# Patient Record
Sex: Female | Born: 1945
Health system: Southern US, Community
[De-identification: ages and names within clinical notes are randomized; demographics above are authoritative.]

## PROBLEM LIST (undated history)

## (undated) DIAGNOSIS — G4730 Sleep apnea, unspecified: Secondary | ICD-10-CM

## (undated) DIAGNOSIS — T7840XA Allergy, unspecified, initial encounter: Secondary | ICD-10-CM

## (undated) DIAGNOSIS — M20002 Unspecified deformity of left finger(s): Secondary | ICD-10-CM

## (undated) DIAGNOSIS — I10 Essential (primary) hypertension: Secondary | ICD-10-CM

## (undated) DIAGNOSIS — Z9289 Personal history of other medical treatment: Secondary | ICD-10-CM

## (undated) DIAGNOSIS — F419 Anxiety disorder, unspecified: Secondary | ICD-10-CM

## (undated) DIAGNOSIS — F329 Major depressive disorder, single episode, unspecified: Secondary | ICD-10-CM

## (undated) DIAGNOSIS — Z8719 Personal history of other diseases of the digestive system: Secondary | ICD-10-CM

## (undated) DIAGNOSIS — D649 Anemia, unspecified: Secondary | ICD-10-CM

## (undated) DIAGNOSIS — E785 Hyperlipidemia, unspecified: Secondary | ICD-10-CM

## (undated) DIAGNOSIS — M79645 Pain in left finger(s): Secondary | ICD-10-CM

## (undated) DIAGNOSIS — F32A Depression, unspecified: Secondary | ICD-10-CM

## (undated) DIAGNOSIS — K219 Gastro-esophageal reflux disease without esophagitis: Secondary | ICD-10-CM

## (undated) DIAGNOSIS — M1990 Unspecified osteoarthritis, unspecified site: Secondary | ICD-10-CM

## (undated) DIAGNOSIS — G8929 Other chronic pain: Secondary | ICD-10-CM

## (undated) DIAGNOSIS — D259 Leiomyoma of uterus, unspecified: Secondary | ICD-10-CM

## (undated) DIAGNOSIS — Z889 Allergy status to unspecified drugs, medicaments and biological substances status: Secondary | ICD-10-CM

## (undated) DIAGNOSIS — K5900 Constipation, unspecified: Secondary | ICD-10-CM

## (undated) DIAGNOSIS — M169 Osteoarthritis of hip, unspecified: Secondary | ICD-10-CM

## (undated) HISTORY — DX: Leiomyoma of uterus, unspecified: D25.9

## (undated) HISTORY — PX: TONSILLECTOMY: SUR1361

## (undated) HISTORY — DX: Osteoarthritis of hip, unspecified: M16.9

## (undated) HISTORY — DX: Unspecified deformity of left finger(s): M20.002

## (undated) HISTORY — DX: Pain in left finger(s): M79.645

## (undated) HISTORY — DX: Other chronic pain: G89.29

## (undated) HISTORY — PX: WISDOM TOOTH EXTRACTION: SHX21

---

## 1999-09-23 ENCOUNTER — Other Ambulatory Visit: Admission: RE | Admit: 1999-09-23 | Discharge: 1999-09-23 | Payer: Self-pay | Admitting: Obstetrics and Gynecology

## 2000-11-23 ENCOUNTER — Other Ambulatory Visit: Admission: RE | Admit: 2000-11-23 | Discharge: 2000-11-23 | Payer: Self-pay | Admitting: Obstetrics and Gynecology

## 2002-04-07 ENCOUNTER — Other Ambulatory Visit: Admission: RE | Admit: 2002-04-07 | Discharge: 2002-04-07 | Payer: Self-pay | Admitting: Obstetrics and Gynecology

## 2002-04-10 ENCOUNTER — Encounter: Payer: Self-pay | Admitting: Obstetrics and Gynecology

## 2002-04-10 ENCOUNTER — Encounter: Admission: RE | Admit: 2002-04-10 | Discharge: 2002-04-10 | Payer: Self-pay | Admitting: Obstetrics and Gynecology

## 2003-05-01 ENCOUNTER — Other Ambulatory Visit: Admission: RE | Admit: 2003-05-01 | Discharge: 2003-05-01 | Payer: Self-pay | Admitting: Obstetrics and Gynecology

## 2003-05-04 ENCOUNTER — Encounter: Admission: RE | Admit: 2003-05-04 | Discharge: 2003-05-04 | Payer: Self-pay | Admitting: Obstetrics and Gynecology

## 2003-05-04 ENCOUNTER — Encounter: Payer: Self-pay | Admitting: Obstetrics and Gynecology

## 2003-08-08 ENCOUNTER — Encounter: Admission: RE | Admit: 2003-08-08 | Discharge: 2003-09-14 | Payer: Self-pay | Admitting: Psychology

## 2004-05-20 ENCOUNTER — Encounter: Admission: RE | Admit: 2004-05-20 | Discharge: 2004-05-20 | Payer: Self-pay | Admitting: Obstetrics and Gynecology

## 2004-05-23 ENCOUNTER — Encounter: Admission: RE | Admit: 2004-05-23 | Discharge: 2004-05-23 | Payer: Self-pay | Admitting: Obstetrics and Gynecology

## 2005-06-24 ENCOUNTER — Encounter: Admission: RE | Admit: 2005-06-24 | Discharge: 2005-06-24 | Payer: Self-pay | Admitting: Obstetrics and Gynecology

## 2005-11-09 HISTORY — PX: DILATATION & CURRETTAGE/HYSTEROSCOPY WITH RESECTOCOPE: SHX5572

## 2005-11-09 HISTORY — PX: UTERINE FIBROID SURGERY: SHX826

## 2006-05-19 ENCOUNTER — Ambulatory Visit (HOSPITAL_BASED_OUTPATIENT_CLINIC_OR_DEPARTMENT_OTHER): Admission: RE | Admit: 2006-05-19 | Discharge: 2006-05-19 | Payer: Self-pay | Admitting: Obstetrics and Gynecology

## 2006-06-25 ENCOUNTER — Encounter: Admission: RE | Admit: 2006-06-25 | Discharge: 2006-06-25 | Payer: Self-pay | Admitting: Obstetrics and Gynecology

## 2006-11-17 ENCOUNTER — Ambulatory Visit (HOSPITAL_COMMUNITY): Admission: RE | Admit: 2006-11-17 | Discharge: 2006-11-17 | Payer: Self-pay | Admitting: Obstetrics and Gynecology

## 2006-11-17 ENCOUNTER — Encounter (INDEPENDENT_AMBULATORY_CARE_PROVIDER_SITE_OTHER): Payer: Self-pay | Admitting: Specialist

## 2007-07-04 ENCOUNTER — Encounter: Admission: RE | Admit: 2007-07-04 | Discharge: 2007-07-04 | Payer: Self-pay | Admitting: Obstetrics and Gynecology

## 2008-07-06 ENCOUNTER — Encounter: Admission: RE | Admit: 2008-07-06 | Discharge: 2008-07-06 | Payer: Self-pay | Admitting: Obstetrics and Gynecology

## 2009-09-24 ENCOUNTER — Encounter: Admission: RE | Admit: 2009-09-24 | Discharge: 2009-09-24 | Payer: Self-pay | Admitting: Obstetrics and Gynecology

## 2010-11-12 ENCOUNTER — Encounter
Admission: RE | Admit: 2010-11-12 | Discharge: 2010-11-12 | Payer: Self-pay | Source: Home / Self Care | Attending: Obstetrics and Gynecology | Admitting: Obstetrics and Gynecology

## 2010-11-30 ENCOUNTER — Encounter: Payer: Self-pay | Admitting: Obstetrics and Gynecology

## 2011-03-27 NOTE — Op Note (Signed)
Molly Lucas, Molly Lucas                ACCOUNT NO.:  [id number]   MEDICAL RECORD NO.:  [id number]          PATIENT TYPE:  AMB   LOCATION:  [facility]                         FACILITY:  [facility]   PHYSICIAN:  Sherry A. Dickstein, M.D.DATE OF BIRTH:  1946/09/08   DATE OF PROCEDURE:  05/19/2006  DATE OF DISCHARGE:                                 OPERATIVE REPORT   PREOPERATIVE DIAGNOSES:  1.  Postmenopausal bleeding.  2.  Endometrial polyp versus submucosal fibroid.   POSTOPERATIVE DIAGNOSES:  1.  Postmenopausal bleeding.  2.  Endometrial polyp versus submucosal fibroid.  3.  Cervical stenosis.   PROCEDURES:  Dilatation and curettage.   SURGEON:  Sherry A. Rosalio Macadamia, M.D.   ANESTHESIA:  MAC.   INDICATIONS:  This is a 65 year old G5, P3-0-2-3, woman, who has been on  hormone replacement therapy for approximately 14 years.  This has been  decreased in dose.  Approximately 6-8 weeks ago the patient noticed spotting  off and on with heavier bleeding at times.  The patient had an ultrasound  which showed fluid present and tissue consistent with a submucosal fibroid  or possible polyp.  Because of this, the patient is brought into the  operating room for [facility] hysteroscopy with resectoscope.   FINDINGS:  Cervical stenosis.  Uterus anteflexed, normal size, no adnexal  mass.   PROCEDURE:  The patient was brought into the operating room and given  adequate IV sedation.  She was placed in the dorsal lithotomy position.  Her  perineum was washed with Betadine.  She was draped in a sterile fashion.  The surgeon's gown and gloves were changed.  A speculum was placed within  the vagina.  The vagina was washed with Betadine.  A paracervical block was  administered with 1% Nesacaine.  The cervix was grasped with a single-tooth  tenaculum.  The cervix was noted to be stenotic.  Initially the cervix could  not be dilated because of the stenosis; however, with just very gentle  probing, the cervix was able  to be dilated.  Initially using a #17, the  cervix was dilated and it was felt to have normal dilatation without any  difficulty.  However, with just very gentle dilatation, before the cervix  was dilated even past a #23, it was felt that it was likely that the uterus  had been perforated.  The dilators had gone longer into the uterine cavity  than was felt that the uterine size was.  Therefore, before any further  dilatation was performed, it was decided to stop the whole procedure because  it was felt that this would be dangerous to the patient to continue with the  procedure.  Therefore, no curettage was performed and no further dilating  was performed beyond approximately a 21 or 23 Pratt dilator.  There was no  significant bleeding whatsoever, and this was not felt to be either dilating  off laterally or significantly anterior or posteriorly.  It was felt that  this was probably a midline fundal perforation if it were perforated at all.  A decision was made that this procedure would  be performed a future time  under ultrasound guidance, and it was felt that this was likely to have  happened because of the initial cervical stenosis.  The surgeon's gloves  were changed and the bladder was in-and-out catheterized to assure that no  bladder injury had been performed.  Clear urine was present and this was  felt that reassurance was present.  All instruments had been removed from  the vagina previously.  The patient was cleaned and was taken out of the  dorsal lithotomy position.  She was awakened.  She was moved from the  operating table to a stretcher in stable condition.   COMPLICATIONS:  Uterine perforation.   ESTIMATED BLOOD LOSS:  Less than 5 mL.   Stable condition.      Sherry A. Rosalio Macadamia, M.D.  Electronically Signed     SAD/MEDQ  D:  05/19/2006  T:  05/19/2006  Job:  [id number]

## 2011-03-27 NOTE — Op Note (Signed)
Molly Lucas, Molly Lucas                ACCOUNT NO.:  [id number]   MEDICAL RECORD NO.:  [id number]          PATIENT TYPE:  AMB   LOCATION:  [facility]                           FACILITY:  [facility]   PHYSICIAN:  Sherry A. Dickstein, M.D.DATE OF BIRTH:  Aug 09, 1946   DATE OF PROCEDURE:  11/17/2006  DATE OF DISCHARGE:                               OPERATIVE REPORT   PREOPERATIVE DIAGNOSES:  1. Postmenopausal bleeding.  2. Cervical stenosis.   POSTOPERATIVE DIAGNOSES:  1. Postmenopausal bleeding.  2. Cervical stenosis.  3. Submucosal fibroid.   SURGEON:  Sherry A. Rosalio Macadamia, M.D.   ANESTHESIA:  General.   PROCEDURE:  1. Dilatation and curettage.  2. Hysteroscopy with resectoscope.   INDICATIONS:  This is a 65 year old G5, P3-0-2-3 woman who has had  ongoing postmenopausal bleeding.  The patient was brought to the  operating room approximately 6 months ago at [facility]  for [facility] and hysteroscopy with resectoscope.  The dilating was difficult  because of her cervical stenosis and a perforation of the uterus  happened.  The procedure was stopped because of an assumed perforation.  The patient was allowed to heal.  Repeat ultrasound was performed  showing fluid collection in the endometrium and presumed persistent  cervical stenosis.  The patient has known fibroid on ultrasound.  Because of the persistent vaginal bleeding and the previous uterine  perforation, the patient is brought to the operating room for [facility] and  hysteroscopy with resectoscope under ultrasound guidance.   FINDINGS:  A normal-sized anteflexed uterus, no adnexal mass, ultrasound  revealing normal uterus with no significant hematometra.   PROCEDURE:  The patient was brought into the operating room and given  adequate general anesthesia.  She was placed in the dorsal lithotomy  position.  Her perineum and vagina were washed with Betadine.  A Foley  catheter was inserted into the bladder and clamped off in case  any fluid  was needed to be inserted into the bladder for the ultrasound.  The  speculum was placed within the vagina.  A paracervical block was  administered with 1% Nesacaine.  The cervix was grasped with a single-  toothed tenaculum.  Cervix was sounded.  Small dilators were used  initially for dilatation.  Under ultrasound guidance, the dilators were  dilated up to a #33.  The dilatation was very careful to not perforate  and they were just dilated right to the fundus.  The hysteroscope was  then introduced into the endometrial cavity and pictures were obtained.  A submucosal fibroid was identified.  There was no endometrial  irregularity that could be identified as any kind of endometrial cancer  or any significant thickening of the endometrium.  Because it was felt  that the submucosal fibroid was probably the cause of the irregular  bleeding by a physical mechanical component, the submucosal fibroid was  removed with a double-loop resectoscope; this was removed in pieces.  When it was felt that the most of the fibroid was removed, the  resectoscope was replaced into the endometrial cavity, but in the last  time it was  replaced, it was obvious that the uterus had been perforated  just by its placement because bowel could be seen at this point.  No  electricity was used during this placement.  No electricity had been  used during any part of when there was any sign of any perforation.  There was no significant bleeding.  As soon as this was identified, all  instruments were removed.  There were no significant fluids entered into  the peritoneal cavity.  All instruments were removed from the vagina.  This perforation was midline in the fundus.  There was adequate  hemostasis.  The Foley catheter was removed after it had drained the  bladder.  The patient was then awakened.  She was moved from the  operating table to a stretcher in stable condition.  Estimated blood  loss was 5 mL.   Sorbitol differential was a -200 mL.  Complication was  uterine perforation.      Sherry A. Rosalio Macadamia, M.D.  Electronically Signed     SAD/MEDQ  D:  11/17/2006  T:  11/17/2006  Job:  [id number]

## 2011-06-04 ENCOUNTER — Ambulatory Visit: Payer: 59 | Attending: Family Medicine | Admitting: Physical Therapy

## 2011-06-04 DIAGNOSIS — M2560 Stiffness of unspecified joint, not elsewhere classified: Secondary | ICD-10-CM | POA: Insufficient documentation

## 2011-06-04 DIAGNOSIS — M545 Low back pain, unspecified: Secondary | ICD-10-CM | POA: Insufficient documentation

## 2011-06-04 DIAGNOSIS — R262 Difficulty in walking, not elsewhere classified: Secondary | ICD-10-CM | POA: Insufficient documentation

## 2011-06-04 DIAGNOSIS — IMO0001 Reserved for inherently not codable concepts without codable children: Secondary | ICD-10-CM | POA: Insufficient documentation

## 2011-06-08 ENCOUNTER — Ambulatory Visit: Payer: 59 | Admitting: Physical Therapy

## 2011-06-12 ENCOUNTER — Ambulatory Visit: Payer: 59 | Attending: Family Medicine | Admitting: Physical Therapy

## 2011-06-12 DIAGNOSIS — M2560 Stiffness of unspecified joint, not elsewhere classified: Secondary | ICD-10-CM | POA: Insufficient documentation

## 2011-06-12 DIAGNOSIS — IMO0001 Reserved for inherently not codable concepts without codable children: Secondary | ICD-10-CM | POA: Insufficient documentation

## 2011-06-12 DIAGNOSIS — R262 Difficulty in walking, not elsewhere classified: Secondary | ICD-10-CM | POA: Insufficient documentation

## 2011-06-12 DIAGNOSIS — M545 Low back pain, unspecified: Secondary | ICD-10-CM | POA: Insufficient documentation

## 2011-06-15 ENCOUNTER — Ambulatory Visit: Payer: 59 | Admitting: Physical Therapy

## 2011-06-18 ENCOUNTER — Ambulatory Visit: Payer: 59 | Admitting: Physical Therapy

## 2011-06-22 ENCOUNTER — Ambulatory Visit: Payer: 59 | Admitting: Physical Therapy

## 2011-06-25 ENCOUNTER — Ambulatory Visit: Payer: 59 | Admitting: Physical Therapy

## 2011-06-29 ENCOUNTER — Ambulatory Visit: Payer: 59 | Admitting: Physical Therapy

## 2011-07-02 ENCOUNTER — Ambulatory Visit: Payer: 59

## 2011-07-06 ENCOUNTER — Ambulatory Visit: Payer: 59 | Admitting: Physical Therapy

## 2011-07-09 ENCOUNTER — Ambulatory Visit: Payer: 59 | Admitting: Physical Therapy

## 2011-07-14 ENCOUNTER — Ambulatory Visit: Payer: 59 | Attending: Family Medicine | Admitting: Physical Therapy

## 2011-07-14 DIAGNOSIS — R262 Difficulty in walking, not elsewhere classified: Secondary | ICD-10-CM | POA: Insufficient documentation

## 2011-07-14 DIAGNOSIS — M545 Low back pain, unspecified: Secondary | ICD-10-CM | POA: Insufficient documentation

## 2011-07-14 DIAGNOSIS — IMO0001 Reserved for inherently not codable concepts without codable children: Secondary | ICD-10-CM | POA: Insufficient documentation

## 2011-07-14 DIAGNOSIS — M2560 Stiffness of unspecified joint, not elsewhere classified: Secondary | ICD-10-CM | POA: Insufficient documentation

## 2011-07-16 ENCOUNTER — Encounter: Payer: 59 | Admitting: Physical Therapy

## 2011-07-21 ENCOUNTER — Encounter: Payer: 59 | Admitting: Physical Therapy

## 2011-07-23 ENCOUNTER — Encounter: Payer: 59 | Admitting: Physical Therapy

## 2011-07-24 ENCOUNTER — Emergency Department (HOSPITAL_COMMUNITY)
Admission: EM | Admit: 2011-07-24 | Discharge: 2011-07-24 | Disposition: A | Discharge status: Home / Self Care | Payer: 59 | Attending: Emergency Medicine | Admitting: Emergency Medicine

## 2011-07-24 DIAGNOSIS — F3289 Other specified depressive episodes: Secondary | ICD-10-CM | POA: Insufficient documentation

## 2011-07-24 DIAGNOSIS — I10 Essential (primary) hypertension: Secondary | ICD-10-CM | POA: Insufficient documentation

## 2011-07-24 DIAGNOSIS — E7800 Pure hypercholesterolemia, unspecified: Secondary | ICD-10-CM | POA: Insufficient documentation

## 2011-07-24 DIAGNOSIS — F329 Major depressive disorder, single episode, unspecified: Secondary | ICD-10-CM | POA: Insufficient documentation

## 2011-07-24 LAB — COMPREHENSIVE METABOLIC PANEL
Alkaline Phosphatase: 135 U/L — ABNORMAL HIGH (ref 39–117)
BUN: 11 mg/dL (ref 6–23)
Chloride: 99 mEq/L (ref 96–112)
GFR calc Af Amer: >60 mL/min (ref >60)
GFR calc non Af Amer: >60 mL/min (ref >60)
Glucose, Bld: 100 mg/dL — ABNORMAL HIGH (ref 70–99)
Potassium: 3.9 mEq/L (ref 3.5–5.1)
Total Bilirubin: 1.8 mg/dL — ABNORMAL HIGH (ref 0.3–1.2)

## 2011-07-24 LAB — DIFFERENTIAL
Basophils Absolute: 0 10*3/uL (ref 0.0–0.1)
Basophils Relative: 0 % (ref 0–1)
Eosinophils Absolute: 0.2 10*3/uL (ref 0.0–0.7)
Monocytes Absolute: 0.7 10*3/uL (ref 0.1–1.0)
Monocytes Relative: 10 % (ref 3–12)
Neutrophils Relative %: 62 % (ref 43–77)

## 2011-07-24 LAB — ETHANOL: Alcohol, Ethyl (B): <11 mg/dL (ref 0–11)

## 2011-07-24 LAB — CBC
MCH: 33.3 pg (ref 26.0–34.0)
MCHC: 35.2 g/dL (ref 30.0–36.0)
Platelets: 252 10*3/uL (ref 150–400)
RBC: 5.19 MIL/uL — ABNORMAL HIGH (ref 3.87–5.11)

## 2011-07-24 LAB — RAPID URINE DRUG SCREEN, HOSP PERFORMED
Cocaine: NOT DETECTED
Opiates: NOT DETECTED

## 2011-07-30 ENCOUNTER — Ambulatory Visit: Payer: 59 | Admitting: Physical Therapy

## 2012-03-29 ENCOUNTER — Other Ambulatory Visit: Payer: Self-pay | Admitting: Family Medicine

## 2012-03-29 DIAGNOSIS — M545 Low back pain, unspecified: Secondary | ICD-10-CM

## 2012-04-01 ENCOUNTER — Ambulatory Visit
Admission: RE | Admit: 2012-04-01 | Discharge: 2012-04-01 | Disposition: A | Discharge status: Home / Self Care | Payer: 59 | Source: Ambulatory Visit | Attending: Family Medicine | Admitting: Family Medicine

## 2012-04-01 DIAGNOSIS — M545 Low back pain, unspecified: Secondary | ICD-10-CM

## 2012-04-06 ENCOUNTER — Other Ambulatory Visit: Payer: Self-pay | Admitting: Family Medicine

## 2012-04-06 DIAGNOSIS — D492 Neoplasm of unspecified behavior of bone, soft tissue, and skin: Secondary | ICD-10-CM

## 2012-04-08 ENCOUNTER — Ambulatory Visit
Admission: RE | Admit: 2012-04-08 | Discharge: 2012-04-08 | Disposition: A | Discharge status: Home / Self Care | Payer: 59 | Source: Ambulatory Visit | Attending: Family Medicine | Admitting: Family Medicine

## 2012-04-08 DIAGNOSIS — D492 Neoplasm of unspecified behavior of bone, soft tissue, and skin: Secondary | ICD-10-CM

## 2012-04-22 DIAGNOSIS — F331 Major depressive disorder, recurrent, moderate: Secondary | ICD-10-CM | POA: Diagnosis not present

## 2012-04-25 DIAGNOSIS — M25559 Pain in unspecified hip: Secondary | ICD-10-CM | POA: Diagnosis not present

## 2012-04-25 DIAGNOSIS — M47817 Spondylosis without myelopathy or radiculopathy, lumbosacral region: Secondary | ICD-10-CM | POA: Diagnosis not present

## 2012-04-25 DIAGNOSIS — M48061 Spinal stenosis, lumbar region without neurogenic claudication: Secondary | ICD-10-CM | POA: Diagnosis not present

## 2012-04-26 DIAGNOSIS — M5137 Other intervertebral disc degeneration, lumbosacral region: Secondary | ICD-10-CM | POA: Diagnosis not present

## 2012-04-26 DIAGNOSIS — D216 Benign neoplasm of connective and other soft tissue of trunk, unspecified: Secondary | ICD-10-CM | POA: Diagnosis not present

## 2012-04-26 DIAGNOSIS — M4120 Other idiopathic scoliosis, site unspecified: Secondary | ICD-10-CM | POA: Diagnosis not present

## 2012-04-26 DIAGNOSIS — M545 Low back pain, unspecified: Secondary | ICD-10-CM | POA: Diagnosis not present

## 2012-04-26 DIAGNOSIS — M47817 Spondylosis without myelopathy or radiculopathy, lumbosacral region: Secondary | ICD-10-CM | POA: Diagnosis not present

## 2012-04-26 DIAGNOSIS — M546 Pain in thoracic spine: Secondary | ICD-10-CM | POA: Diagnosis not present

## 2012-04-28 DIAGNOSIS — F329 Major depressive disorder, single episode, unspecified: Secondary | ICD-10-CM | POA: Diagnosis not present

## 2012-04-28 DIAGNOSIS — I10 Essential (primary) hypertension: Secondary | ICD-10-CM | POA: Diagnosis not present

## 2012-04-28 DIAGNOSIS — F3289 Other specified depressive episodes: Secondary | ICD-10-CM | POA: Diagnosis not present

## 2012-04-28 DIAGNOSIS — M545 Low back pain, unspecified: Secondary | ICD-10-CM | POA: Diagnosis not present

## 2012-04-28 DIAGNOSIS — I872 Venous insufficiency (chronic) (peripheral): Secondary | ICD-10-CM | POA: Diagnosis not present

## 2012-05-04 DIAGNOSIS — F331 Major depressive disorder, recurrent, moderate: Secondary | ICD-10-CM | POA: Diagnosis not present

## 2012-05-11 DIAGNOSIS — M25559 Pain in unspecified hip: Secondary | ICD-10-CM | POA: Diagnosis not present

## 2012-05-11 DIAGNOSIS — M1610 Unilateral primary osteoarthritis, unspecified hip: Secondary | ICD-10-CM | POA: Diagnosis not present

## 2012-05-11 DIAGNOSIS — M169 Osteoarthritis of hip, unspecified: Secondary | ICD-10-CM | POA: Diagnosis not present

## 2012-05-30 DIAGNOSIS — F331 Major depressive disorder, recurrent, moderate: Secondary | ICD-10-CM | POA: Diagnosis not present

## 2012-05-30 DIAGNOSIS — E785 Hyperlipidemia, unspecified: Secondary | ICD-10-CM | POA: Diagnosis not present

## 2012-05-30 DIAGNOSIS — I10 Essential (primary) hypertension: Secondary | ICD-10-CM | POA: Diagnosis not present

## 2012-05-31 DIAGNOSIS — Z01419 Encounter for gynecological examination (general) (routine) without abnormal findings: Secondary | ICD-10-CM | POA: Diagnosis not present

## 2012-05-31 DIAGNOSIS — Z124 Encounter for screening for malignant neoplasm of cervix: Secondary | ICD-10-CM | POA: Diagnosis not present

## 2012-05-31 DIAGNOSIS — Z1151 Encounter for screening for human papillomavirus (HPV): Secondary | ICD-10-CM | POA: Diagnosis not present

## 2012-06-01 ENCOUNTER — Other Ambulatory Visit: Payer: Self-pay | Admitting: Obstetrics & Gynecology

## 2012-06-01 DIAGNOSIS — Z1231 Encounter for screening mammogram for malignant neoplasm of breast: Secondary | ICD-10-CM

## 2012-06-01 DIAGNOSIS — I10 Essential (primary) hypertension: Secondary | ICD-10-CM | POA: Diagnosis not present

## 2012-06-01 DIAGNOSIS — M545 Low back pain, unspecified: Secondary | ICD-10-CM | POA: Diagnosis not present

## 2012-06-01 DIAGNOSIS — Z0000 Encounter for general adult medical examination without abnormal findings: Secondary | ICD-10-CM | POA: Diagnosis not present

## 2012-06-01 DIAGNOSIS — K5900 Constipation, unspecified: Secondary | ICD-10-CM | POA: Diagnosis not present

## 2012-06-02 ENCOUNTER — Other Ambulatory Visit: Payer: Self-pay | Admitting: Obstetrics

## 2012-06-02 DIAGNOSIS — M25559 Pain in unspecified hip: Secondary | ICD-10-CM | POA: Diagnosis not present

## 2012-06-02 DIAGNOSIS — M1610 Unilateral primary osteoarthritis, unspecified hip: Secondary | ICD-10-CM | POA: Diagnosis not present

## 2012-06-02 DIAGNOSIS — M169 Osteoarthritis of hip, unspecified: Secondary | ICD-10-CM | POA: Diagnosis not present

## 2012-06-02 DIAGNOSIS — Z780 Asymptomatic menopausal state: Secondary | ICD-10-CM

## 2012-06-06 DIAGNOSIS — F331 Major depressive disorder, recurrent, moderate: Secondary | ICD-10-CM | POA: Diagnosis not present

## 2012-06-09 HISTORY — PX: HERNIA REPAIR: SHX51

## 2012-06-13 ENCOUNTER — Encounter (HOSPITAL_COMMUNITY): Payer: Self-pay

## 2012-06-14 ENCOUNTER — Ambulatory Visit
Admission: RE | Admit: 2012-06-14 | Discharge: 2012-06-14 | Disposition: A | Discharge status: Home / Self Care | Payer: Medicare Other | Source: Ambulatory Visit | Attending: Obstetrics & Gynecology | Admitting: Obstetrics & Gynecology

## 2012-06-14 ENCOUNTER — Ambulatory Visit
Admission: RE | Admit: 2012-06-14 | Discharge: 2012-06-14 | Disposition: A | Discharge status: Home / Self Care | Payer: Medicare Other | Source: Ambulatory Visit | Attending: Obstetrics | Admitting: Obstetrics

## 2012-06-14 DIAGNOSIS — Z780 Asymptomatic menopausal state: Secondary | ICD-10-CM | POA: Diagnosis not present

## 2012-06-14 DIAGNOSIS — Z1231 Encounter for screening mammogram for malignant neoplasm of breast: Secondary | ICD-10-CM | POA: Diagnosis not present

## 2012-06-14 DIAGNOSIS — Z13820 Encounter for screening for osteoporosis: Secondary | ICD-10-CM | POA: Diagnosis not present

## 2012-06-15 ENCOUNTER — Other Ambulatory Visit (HOSPITAL_COMMUNITY): Payer: Self-pay | Admitting: Orthopaedic Surgery

## 2012-06-15 DIAGNOSIS — M25559 Pain in unspecified hip: Secondary | ICD-10-CM | POA: Diagnosis not present

## 2012-06-15 DIAGNOSIS — M1610 Unilateral primary osteoarthritis, unspecified hip: Secondary | ICD-10-CM | POA: Diagnosis not present

## 2012-06-15 NOTE — Patient Instructions (Signed)
20 ELIOT BENCIVENGA  06/15/2012   Your procedure is scheduled on:  06/24/12 1000am-1208pm  Report to [facility] at 0730 AM.  Call this number if you have problems the morning of surgery: [phone]   Remember:   Do not eat food:After Midnight.  May have clear liquids:until Midnight .  Marland Kitchen  Take these medicines the morning of surgery with A SIP OF WATER:    Do not wear jewelry, make-up or nail polish.  Do not wear lotions, powders, or perfumes..  Do not shave 48 hours prior to surgery.  Do not bring valuables to the hospital.  Contacts, dentures or bridgework may not be worn into surgery.  Leave suitcase in the car. After surgery it may be brought to your room.  For patients admitted to the hospital, checkout time is 11:00 AM the day of discharge.      Special Instructions: CHG Shower Use Special Wash: 1/2 bottle night before surgery and 1/2 bottle morning of surgery. shower chin to toes with CHG.  Wash face and private parts with regular soap.    Please read over the following fact sheets that you were given: MRSA Information, Blood Transfusion Fact Sheet, Incentive Spirometry Fact sheet , coughing and deep breathing exercises, leg exercises

## 2012-06-16 ENCOUNTER — Encounter (HOSPITAL_COMMUNITY): Payer: Self-pay

## 2012-06-16 ENCOUNTER — Ambulatory Visit (HOSPITAL_COMMUNITY)
Admission: RE | Admit: 2012-06-16 | Discharge: 2012-06-16 | Disposition: A | Discharge status: Home / Self Care | Payer: Medicare Other | Source: Ambulatory Visit | Attending: Orthopaedic Surgery | Admitting: Orthopaedic Surgery

## 2012-06-16 ENCOUNTER — Encounter (HOSPITAL_COMMUNITY)
Admission: RE | Admit: 2012-06-16 | Discharge: 2012-06-16 | Disposition: A | Discharge status: Home / Self Care | Payer: Medicare Other | Source: Ambulatory Visit | Attending: Orthopaedic Surgery | Admitting: Orthopaedic Surgery

## 2012-06-16 DIAGNOSIS — R9431 Abnormal electrocardiogram [ECG] [EKG]: Secondary | ICD-10-CM | POA: Diagnosis not present

## 2012-06-16 DIAGNOSIS — Z01811 Encounter for preprocedural respiratory examination: Secondary | ICD-10-CM | POA: Diagnosis not present

## 2012-06-16 DIAGNOSIS — Z01810 Encounter for preprocedural cardiovascular examination: Secondary | ICD-10-CM | POA: Diagnosis not present

## 2012-06-16 DIAGNOSIS — Z01812 Encounter for preprocedural laboratory examination: Secondary | ICD-10-CM | POA: Diagnosis not present

## 2012-06-16 DIAGNOSIS — Z01818 Encounter for other preprocedural examination: Secondary | ICD-10-CM | POA: Insufficient documentation

## 2012-06-16 DIAGNOSIS — I10 Essential (primary) hypertension: Secondary | ICD-10-CM | POA: Insufficient documentation

## 2012-06-16 HISTORY — DX: Sleep apnea, unspecified: G47.30

## 2012-06-16 HISTORY — DX: Anxiety disorder, unspecified: F41.9

## 2012-06-16 HISTORY — DX: Major depressive disorder, single episode, unspecified: F32.9

## 2012-06-16 HISTORY — DX: Unspecified osteoarthritis, unspecified site: M19.90

## 2012-06-16 HISTORY — DX: Essential (primary) hypertension: I10

## 2012-06-16 HISTORY — DX: Depression, unspecified: F32.A

## 2012-06-16 LAB — URINALYSIS, ROUTINE W REFLEX MICROSCOPIC
Bilirubin Urine: NEGATIVE
Glucose, UA: NEGATIVE mg/dL
Hgb urine dipstick: NEGATIVE
Nitrite: NEGATIVE
Specific Gravity, Urine: 1.025 (ref 1.005–1.030)
pH: 5.5 (ref 5.0–8.0)

## 2012-06-16 LAB — PROTIME-INR
INR: 1.02 (ref 0.00–1.49)
Prothrombin Time: 13.6 seconds (ref 11.6–15.2)

## 2012-06-16 LAB — URINE MICROSCOPIC-ADD ON

## 2012-06-16 LAB — APTT: aPTT: 29 seconds (ref 24–37)

## 2012-06-16 LAB — SURGICAL PCR SCREEN: Staphylococcus aureus: NEGATIVE

## 2012-06-16 NOTE — Progress Notes (Signed)
Patient uses mouthpiece made by dentist instead of CPAP machine.  Patient to bring with her on day of surgery.

## 2012-06-16 NOTE — Progress Notes (Signed)
Abnormal results of urinalysis and micro results faxed and confirmation received to Dr Doneen Poisson.

## 2012-06-17 NOTE — Progress Notes (Signed)
Dr Acey Lav made aware of EKG.  No old ekg to compare from PCP office.  Only history is hypertension. No further orders given.  Patient voiced no cardiac symptoms at preop visit.

## 2012-06-20 NOTE — Progress Notes (Signed)
Vital signs that are documented on 06/17/12 are in error.  The correct vital signs are the ones that are listed on 06/16/12 at 1039am.

## 2012-06-22 DIAGNOSIS — F331 Major depressive disorder, recurrent, moderate: Secondary | ICD-10-CM | POA: Diagnosis not present

## 2012-06-23 NOTE — Progress Notes (Signed)
Patient called to ask if she should bring her supplements with her when she comes to hospital on 06/24/12 for surgery.  I instructed patient not to bring her supplements with her to the hospital .  Patient voiced understanding.

## 2012-06-24 ENCOUNTER — Encounter (HOSPITAL_COMMUNITY): Payer: Self-pay | Admitting: *Deleted

## 2012-06-24 ENCOUNTER — Encounter (HOSPITAL_COMMUNITY): Payer: Self-pay | Admitting: Anesthesiology

## 2012-06-24 ENCOUNTER — Encounter (HOSPITAL_COMMUNITY)
Admission: RE | Disposition: A | Discharge status: Skilled Nursing Facility | Payer: Self-pay | Source: Ambulatory Visit | Attending: Orthopaedic Surgery

## 2012-06-24 ENCOUNTER — Inpatient Hospital Stay (HOSPITAL_COMMUNITY): Payer: Medicare Other

## 2012-06-24 ENCOUNTER — Inpatient Hospital Stay (HOSPITAL_COMMUNITY)
Admission: RE | Admit: 2012-06-24 | Discharge: 2012-06-28 | DRG: 470 | Disposition: A | Discharge status: Skilled Nursing Facility | Payer: Medicare Other | Source: Ambulatory Visit | Attending: Orthopaedic Surgery | Admitting: Orthopaedic Surgery

## 2012-06-24 ENCOUNTER — Inpatient Hospital Stay (HOSPITAL_COMMUNITY): Payer: Medicare Other | Admitting: Anesthesiology

## 2012-06-24 DIAGNOSIS — I10 Essential (primary) hypertension: Secondary | ICD-10-CM | POA: Diagnosis not present

## 2012-06-24 DIAGNOSIS — Z79899 Other long term (current) drug therapy: Secondary | ICD-10-CM | POA: Diagnosis not present

## 2012-06-24 DIAGNOSIS — F411 Generalized anxiety disorder: Secondary | ICD-10-CM | POA: Diagnosis present

## 2012-06-24 DIAGNOSIS — G4730 Sleep apnea, unspecified: Secondary | ICD-10-CM | POA: Diagnosis present

## 2012-06-24 DIAGNOSIS — F329 Major depressive disorder, single episode, unspecified: Secondary | ICD-10-CM | POA: Diagnosis not present

## 2012-06-24 DIAGNOSIS — F3289 Other specified depressive episodes: Secondary | ICD-10-CM | POA: Diagnosis present

## 2012-06-24 DIAGNOSIS — D62 Acute posthemorrhagic anemia: Secondary | ICD-10-CM | POA: Diagnosis not present

## 2012-06-24 DIAGNOSIS — Z880 Allergy status to penicillin: Secondary | ICD-10-CM

## 2012-06-24 DIAGNOSIS — S79919A Unspecified injury of unspecified hip, initial encounter: Secondary | ICD-10-CM | POA: Diagnosis not present

## 2012-06-24 DIAGNOSIS — D649 Anemia, unspecified: Secondary | ICD-10-CM | POA: Diagnosis not present

## 2012-06-24 DIAGNOSIS — E785 Hyperlipidemia, unspecified: Secondary | ICD-10-CM | POA: Diagnosis not present

## 2012-06-24 DIAGNOSIS — M25559 Pain in unspecified hip: Secondary | ICD-10-CM | POA: Diagnosis not present

## 2012-06-24 DIAGNOSIS — M169 Osteoarthritis of hip, unspecified: Secondary | ICD-10-CM

## 2012-06-24 DIAGNOSIS — M1610 Unilateral primary osteoarthritis, unspecified hip: Principal | ICD-10-CM | POA: Diagnosis present

## 2012-06-24 DIAGNOSIS — M1990 Unspecified osteoarthritis, unspecified site: Secondary | ICD-10-CM | POA: Diagnosis not present

## 2012-06-24 DIAGNOSIS — Z471 Aftercare following joint replacement surgery: Secondary | ICD-10-CM | POA: Diagnosis not present

## 2012-06-24 DIAGNOSIS — Z96659 Presence of unspecified artificial knee joint: Secondary | ICD-10-CM | POA: Diagnosis not present

## 2012-06-24 DIAGNOSIS — Z96649 Presence of unspecified artificial hip joint: Secondary | ICD-10-CM | POA: Diagnosis not present

## 2012-06-24 DIAGNOSIS — Z5189 Encounter for other specified aftercare: Secondary | ICD-10-CM | POA: Diagnosis not present

## 2012-06-24 HISTORY — PX: TOTAL HIP ARTHROPLASTY: SHX124

## 2012-06-24 LAB — PREPARE RBC (CROSSMATCH)

## 2012-06-24 SURGERY — ARTHROPLASTY, HIP, TOTAL, ANTERIOR APPROACH
Anesthesia: General | Site: Hip | Laterality: Left | Wound class: Clean

## 2012-06-24 MED ORDER — FUROSEMIDE 10 MG/ML IJ SOLN
20.0000 mg | Freq: Once | INTRAMUSCULAR | Status: AC
  Administered 2012-06-24: 20 mg via INTRAVENOUS
  Filled 2012-06-24: qty 2

## 2012-06-24 MED ORDER — ACETAMINOPHEN 10 MG/ML IV SOLN
INTRAVENOUS | Status: DC | PRN
  Administered 2012-06-24: 1000 mg via INTRAVENOUS

## 2012-06-24 MED ORDER — ROCURONIUM BROMIDE 100 MG/10ML IV SOLN
INTRAVENOUS | Status: DC | PRN
  Administered 2012-06-24: 30 mg via INTRAVENOUS
  Administered 2012-06-24: 10 mg via INTRAVENOUS

## 2012-06-24 MED ORDER — ALUM & MAG HYDROXIDE-SIMETH 200-200-20 MG/5ML PO SUSP: 30.0000 mL | ORAL | Status: DC | PRN

## 2012-06-24 MED ORDER — PROMETHAZINE HCL 25 MG/ML IJ SOLN: 6.2500 mg | INTRAMUSCULAR | Status: DC | PRN

## 2012-06-24 MED ORDER — MORPHINE SULFATE 2 MG/ML IJ SOLN
2.0000 mg | INTRAMUSCULAR | Status: DC | PRN
  Administered 2012-06-24: 2 mg via INTRAVENOUS
  Filled 2012-06-24: qty 1

## 2012-06-24 MED ORDER — VITAMIN D3 125 MCG (5000 UT) PO TABS: 10000.0000 mg | ORAL_TABLET | Freq: Every morning | ORAL | Status: DC

## 2012-06-24 MED ORDER — CLORAZEPATE DIPOTASSIUM 3.75 MG PO TABS
11.2500 mg | ORAL_TABLET | Freq: Every day | ORAL | Status: DC
  Administered 2012-06-24 – 2012-06-27 (×4): 11.25 mg via ORAL
  Filled 2012-06-24: qty 2
  Filled 2012-06-24: qty 3
  Filled 2012-06-24: qty 1
  Filled 2012-06-24 (×2): qty 3

## 2012-06-24 MED ORDER — FERROUS SULFATE 325 (65 FE) MG PO TABS
325.0000 mg | ORAL_TABLET | Freq: Three times a day (TID) | ORAL | Status: DC
  Administered 2012-06-24 – 2012-06-28 (×11): 325 mg via ORAL
  Filled 2012-06-24 (×14): qty 1

## 2012-06-24 MED ORDER — ASPIRIN EC 325 MG PO TBEC
325.0000 mg | DELAYED_RELEASE_TABLET | Freq: Two times a day (BID) | ORAL | Status: DC
  Administered 2012-06-24 – 2012-06-28 (×8): 325 mg via ORAL
  Filled 2012-06-24 (×10): qty 1

## 2012-06-24 MED ORDER — OXYCODONE HCL 5 MG PO TABS
5.0000 mg | ORAL_TABLET | ORAL | Status: DC | PRN
  Administered 2012-06-24 – 2012-06-26 (×5): 10 mg via ORAL
  Filled 2012-06-24 (×3): qty 2
  Filled 2012-06-24: qty 1
  Filled 2012-06-24 (×3): qty 2

## 2012-06-24 MED ORDER — METOCLOPRAMIDE HCL 5 MG/ML IJ SOLN: 5.0000 mg | Freq: Three times a day (TID) | INTRAMUSCULAR | Status: DC | PRN

## 2012-06-24 MED ORDER — FENTANYL CITRATE 0.05 MG/ML IJ SOLN
INTRAMUSCULAR | Status: DC | PRN
  Administered 2012-06-24: 50 ug via INTRAVENOUS
  Administered 2012-06-24 (×2): 100 ug via INTRAVENOUS

## 2012-06-24 MED ORDER — DOCUSATE SODIUM 100 MG PO CAPS
100.0000 mg | ORAL_CAPSULE | Freq: Two times a day (BID) | ORAL | Status: DC
  Administered 2012-06-24 – 2012-06-28 (×8): 100 mg via ORAL

## 2012-06-24 MED ORDER — DULOXETINE HCL 60 MG PO CPEP
60.0000 mg | ORAL_CAPSULE | Freq: Every day | ORAL | Status: DC
  Administered 2012-06-24 – 2012-06-27 (×4): 60 mg via ORAL
  Filled 2012-06-24 (×5): qty 1

## 2012-06-24 MED ORDER — ACETAMINOPHEN 650 MG RE SUPP: 650.0000 mg | Freq: Four times a day (QID) | RECTAL | Status: DC | PRN

## 2012-06-24 MED ORDER — ONDANSETRON HCL 4 MG PO TABS: 4.0000 mg | ORAL_TABLET | Freq: Four times a day (QID) | ORAL | Status: DC | PRN

## 2012-06-24 MED ORDER — SODIUM CHLORIDE 0.9 % IV SOLN
INTRAVENOUS | Status: DC
  Administered 2012-06-24 – 2012-06-25 (×3): via INTRAVENOUS
  Administered 2012-06-26: 1000 mL via INTRAVENOUS

## 2012-06-24 MED ORDER — ONDANSETRON HCL 4 MG/2ML IJ SOLN
INTRAMUSCULAR | Status: DC | PRN
  Administered 2012-06-24: 4 mg via INTRAVENOUS

## 2012-06-24 MED ORDER — EPHEDRINE SULFATE 50 MG/ML IJ SOLN
INTRAMUSCULAR | Status: DC | PRN
  Administered 2012-06-24: 10 mg via INTRAVENOUS
  Administered 2012-06-24: 5 mg via INTRAVENOUS
  Administered 2012-06-24: 10 mg via INTRAVENOUS

## 2012-06-24 MED ORDER — GABAPENTIN 300 MG PO CAPS
300.0000 mg | ORAL_CAPSULE | Freq: Two times a day (BID) | ORAL | Status: DC
  Administered 2012-06-24 – 2012-06-28 (×8): 300 mg via ORAL
  Filled 2012-06-24 (×10): qty 1

## 2012-06-24 MED ORDER — SIMVASTATIN 10 MG PO TABS
10.0000 mg | ORAL_TABLET | Freq: Every day | ORAL | Status: DC
  Administered 2012-06-24 – 2012-06-27 (×4): 10 mg via ORAL
  Filled 2012-06-24 (×5): qty 1

## 2012-06-24 MED ORDER — LACTATED RINGERS IV SOLN
INTRAVENOUS | Status: DC
  Administered 2012-06-24 (×3): via INTRAVENOUS
  Administered 2012-06-24: 1000 mL via INTRAVENOUS

## 2012-06-24 MED ORDER — VITAMINS A & D EX OINT
TOPICAL_OINTMENT | CUTANEOUS | Status: AC
  Administered 2012-06-25
  Filled 2012-06-24: qty 5

## 2012-06-24 MED ORDER — METHOCARBAMOL 100 MG/ML IJ SOLN
500.0000 mg | Freq: Four times a day (QID) | INTRAVENOUS | Status: DC | PRN
  Filled 2012-06-24: qty 5

## 2012-06-24 MED ORDER — GLYCOPYRROLATE 0.2 MG/ML IJ SOLN
INTRAMUSCULAR | Status: DC | PRN
  Administered 2012-06-24: 0.4 mg via INTRAVENOUS

## 2012-06-24 MED ORDER — MENTHOL 3 MG MT LOZG: 1.0000 | LOZENGE | OROMUCOSAL | Status: DC | PRN

## 2012-06-24 MED ORDER — CLINDAMYCIN PHOSPHATE 600 MG/50ML IV SOLN
600.0000 mg | Freq: Four times a day (QID) | INTRAVENOUS | Status: AC
  Administered 2012-06-24 (×2): 600 mg via INTRAVENOUS
  Filled 2012-06-24 (×2): qty 50

## 2012-06-24 MED ORDER — DIPHENHYDRAMINE HCL 12.5 MG/5ML PO ELIX: 12.5000 mg | ORAL_SOLUTION | ORAL | Status: DC | PRN

## 2012-06-24 MED ORDER — KETOROLAC TROMETHAMINE 15 MG/ML IJ SOLN
7.5000 mg | Freq: Four times a day (QID) | INTRAMUSCULAR | Status: AC
  Administered 2012-06-24 – 2012-06-25 (×4): 7.5 mg via INTRAVENOUS
  Filled 2012-06-24 (×4): qty 1

## 2012-06-24 MED ORDER — METHOCARBAMOL 500 MG PO TABS
500.0000 mg | ORAL_TABLET | Freq: Four times a day (QID) | ORAL | Status: DC | PRN
  Administered 2012-06-25 (×2): 500 mg via ORAL
  Filled 2012-06-24 (×4): qty 1

## 2012-06-24 MED ORDER — ZOLPIDEM TARTRATE 5 MG PO TABS: 5.0000 mg | ORAL_TABLET | Freq: Every evening | ORAL | Status: DC | PRN

## 2012-06-24 MED ORDER — LOSARTAN POTASSIUM 50 MG PO TABS
100.0000 mg | ORAL_TABLET | Freq: Every morning | ORAL | Status: DC
  Administered 2012-06-28: 100 mg via ORAL
  Filled 2012-06-24 (×5): qty 2

## 2012-06-24 MED ORDER — DEXAMETHASONE SODIUM PHOSPHATE 10 MG/ML IJ SOLN
INTRAMUSCULAR | Status: DC | PRN
  Administered 2012-06-24: 10 mg via INTRAVENOUS

## 2012-06-24 MED ORDER — PROPOFOL 10 MG/ML IV BOLUS
INTRAVENOUS | Status: DC | PRN
  Administered 2012-06-24: 150 mg via INTRAVENOUS

## 2012-06-24 MED ORDER — SUCCINYLCHOLINE CHLORIDE 20 MG/ML IJ SOLN
INTRAMUSCULAR | Status: DC | PRN
  Administered 2012-06-24: 100 mg via INTRAVENOUS

## 2012-06-24 MED ORDER — CLINDAMYCIN PHOSPHATE 900 MG/50ML IV SOLN
900.0000 mg | INTRAVENOUS | Status: AC
  Administered 2012-06-24: 900 mg via INTRAVENOUS
  Filled 2012-06-24: qty 50

## 2012-06-24 MED ORDER — PHENOL 1.4 % MT LIQD: 1.0000 | OROMUCOSAL | Status: DC | PRN

## 2012-06-24 MED ORDER — MIDAZOLAM HCL 5 MG/5ML IJ SOLN
INTRAMUSCULAR | Status: DC | PRN
  Administered 2012-06-24: 2 mg via INTRAVENOUS

## 2012-06-24 MED ORDER — NEOSTIGMINE METHYLSULFATE 1 MG/ML IJ SOLN
INTRAMUSCULAR | Status: DC | PRN
  Administered 2012-06-24: 3 mg via INTRAVENOUS

## 2012-06-24 MED ORDER — VITAMIN D3 25 MCG (1000 UNIT) PO TABS
10000.0000 [IU] | ORAL_TABLET | Freq: Every day | ORAL | Status: DC
  Administered 2012-06-25 – 2012-06-28 (×4): 10000 [IU] via ORAL
  Filled 2012-06-24 (×4): qty 10

## 2012-06-24 MED ORDER — HETASTARCH-ELECTROLYTES 6 % IV SOLN
INTRAVENOUS | Status: DC | PRN
  Administered 2012-06-24: 10:00:00 via INTRAVENOUS

## 2012-06-24 MED ORDER — METOCLOPRAMIDE HCL 10 MG PO TABS: 5.0000 mg | ORAL_TABLET | Freq: Three times a day (TID) | ORAL | Status: DC | PRN

## 2012-06-24 MED ORDER — LIDOCAINE HCL (CARDIAC) 20 MG/ML IV SOLN
INTRAVENOUS | Status: DC | PRN
  Administered 2012-06-24: 50 mg via INTRAVENOUS

## 2012-06-24 MED ORDER — ACETAMINOPHEN 325 MG PO TABS
650.0000 mg | ORAL_TABLET | Freq: Four times a day (QID) | ORAL | Status: DC | PRN
  Administered 2012-06-26 – 2012-06-28 (×6): 650 mg via ORAL
  Filled 2012-06-24 (×4): qty 2
  Filled 2012-06-24: qty 4
  Filled 2012-06-24: qty 2

## 2012-06-24 MED ORDER — 0.9 % SODIUM CHLORIDE (POUR BTL) OPTIME
TOPICAL | Status: DC | PRN
  Administered 2012-06-24: 1000 mL

## 2012-06-24 MED ORDER — HYDROMORPHONE HCL PF 1 MG/ML IJ SOLN
INTRAMUSCULAR | Status: DC | PRN
  Administered 2012-06-24 (×2): 1 mg via INTRAVENOUS

## 2012-06-24 MED ORDER — HYDROMORPHONE HCL PF 1 MG/ML IJ SOLN
0.2500 mg | INTRAMUSCULAR | Status: DC | PRN
  Administered 2012-06-24: 0.5 mg via INTRAVENOUS

## 2012-06-24 MED ORDER — ONDANSETRON HCL 4 MG/2ML IJ SOLN
4.0000 mg | Freq: Four times a day (QID) | INTRAMUSCULAR | Status: DC | PRN
  Administered 2012-06-25: 4 mg via INTRAVENOUS
  Filled 2012-06-24: qty 2

## 2012-06-24 SURGICAL SUPPLY — 35 items
ACETAB CUP W/GRIPTION 54 (Plate) IMPLANT
BAG ZIPLOCK 12X15 (MISCELLANEOUS) ×4 IMPLANT
BLADE SAW SGTL 18X1.27X75 (BLADE) ×2 IMPLANT
CLOTH BEACON ORANGE TIMEOUT ST (SAFETY) ×2 IMPLANT
CUP ACETAB W/GRIPTION 54 (Plate) IMPLANT
DRAPE C-ARM 42X72 X-RAY (DRAPES) ×2 IMPLANT
DRAPE STERI IOBAN 125X83 (DRAPES) ×2 IMPLANT
DRAPE U-SHAPE 47X51 STRL (DRAPES) ×6 IMPLANT
DRSG MEPILEX BORDER 4X8 (GAUZE/BANDAGES/DRESSINGS) ×2 IMPLANT
DURAPREP 26ML APPLICATOR (WOUND CARE) ×2 IMPLANT
ELECT BLADE TIP CTD 4 INCH (ELECTRODE) ×2 IMPLANT
ELECT REM PT RETURN 9FT ADLT (ELECTROSURGICAL) ×2
ELECTRODE REM PT RTRN 9FT ADLT (ELECTROSURGICAL) ×1 IMPLANT
FACESHIELD LNG OPTICON STERILE (SAFETY) ×8 IMPLANT
GAUZE XEROFORM 1X8 LF (GAUZE/BANDAGES/DRESSINGS) ×2 IMPLANT
GLOVE BIO SURGEON STRL SZ7 (GLOVE) ×2 IMPLANT
GLOVE BIO SURGEON STRL SZ7.5 (GLOVE) ×2 IMPLANT
GLOVE BIOGEL PI IND STRL 7.5 (GLOVE) IMPLANT
GLOVE BIOGEL PI IND STRL 8 (GLOVE) ×1 IMPLANT
GLOVE BIOGEL PI INDICATOR 7.5 (GLOVE)
GLOVE BIOGEL PI INDICATOR 8 (GLOVE) ×1
GLOVE ECLIPSE 7.0 STRL STRAW (GLOVE) ×2 IMPLANT
GOWN STRL REIN XL XLG (GOWN DISPOSABLE) ×4 IMPLANT
KIT BASIN OR (CUSTOM PROCEDURE TRAY) ×2 IMPLANT
PACK TOTAL JOINT (CUSTOM PROCEDURE TRAY) ×2 IMPLANT
PADDING CAST COTTON 6X4 STRL (CAST SUPPLIES) ×2 IMPLANT
STAPLER VISISTAT 35W (STAPLE) IMPLANT
SUT ETHIBOND NAB CT1 #1 30IN (SUTURE) ×4 IMPLANT
SUT VIC AB 1 CT1 36 (SUTURE) ×4 IMPLANT
SUT VIC AB 2-0 CT1 27 (SUTURE) ×2
SUT VIC AB 2-0 CT1 TAPERPNT 27 (SUTURE) ×2 IMPLANT
TOWEL OR 17X26 10 PK STRL BLUE (TOWEL DISPOSABLE) ×4 IMPLANT
TOWEL OR NON WOVEN STRL DISP B (DISPOSABLE) ×2 IMPLANT
TRAY FOLEY CATH 14FRSI W/METER (CATHETERS) ×2 IMPLANT
WATER STERILE IRR 1500ML POUR (IV SOLUTION) ×4 IMPLANT

## 2012-06-24 NOTE — Plan of Care (Signed)
Problem: Consults Goal: Diagnosis- Total Joint Replacement Left anterior hip     

## 2012-06-24 NOTE — Transfer of Care (Signed)
Immediate Anesthesia Transfer of Care Note  Patient: Molly Lucas  Procedure(s) Performed: Procedure(s) (LRB): TOTAL HIP ARTHROPLASTY ANTERIOR APPROACH (Left)  Patient Location: PACU  Anesthesia Type: General  Level of Consciousness: sedated  Airway & Oxygen Therapy: Patient Spontanous Breathing and Patient connected to face mask oxygen  Post-op Assessment: Report given to PACU RN and Post -op Vital signs reviewed and stable  Post vital signs: Reviewed and stable  Complications: No apparent anesthesia complications

## 2012-06-24 NOTE — Anesthesia Preprocedure Evaluation (Signed)
Anesthesia Evaluation  Patient identified by MRN, date of birth, ID band Patient awake    Reviewed: Allergy & Precautions, H&P , NPO status , Patient's Chart, lab work & pertinent test results  Airway Mallampati: II TM Distance: <3 FB Neck ROM: Full    Dental No notable dental hx.    Pulmonary sleep apnea ,  breath sounds clear to auscultation  Pulmonary exam normal       Cardiovascular hypertension, Pt. on medications Rhythm:Regular Rate:Normal     Neuro/Psych negative neurological ROS  negative psych ROS   GI/Hepatic negative GI ROS, Neg liver ROS,   Endo/Other  negative endocrine ROS  Renal/GU negative Renal ROS  negative genitourinary   Musculoskeletal negative musculoskeletal ROS (+)   Abdominal   Peds negative pediatric ROS (+)  Hematology negative hematology ROS (+)   Anesthesia Other Findings   Reproductive/Obstetrics negative OB ROS                           Anesthesia Physical Anesthesia Plan  ASA: II  Anesthesia Plan: General   Post-op Pain Management:    Induction: Intravenous  Airway Management Planned: Oral ETT  Additional Equipment:   Intra-op Plan:   Post-operative Plan: Extubation in OR  Informed Consent: I have reviewed the patients History and Physical, chart, labs and discussed the procedure including the risks, benefits and alternatives for the proposed anesthesia with the patient or authorized representative who has indicated his/her understanding and acceptance.   Dental advisory given  Plan Discussed with: CRNA and Surgeon  Anesthesia Plan Comments:         Anesthesia Quick Evaluation

## 2012-06-24 NOTE — H&P (Signed)
Molly Lucas is an 66 y.o. female.   Chief Complaint:   Severe left hip pain HPI:   66 yo female with severe end-stage OA of her left hip.  X-rays show bone-on-bone wear.  She was failed conservative treatment and her mobility is limited.  She wishes to proceed with a total hip arthroplasty.  She understands the risks of blood loss, nerve injury, infection, fracture and DVT.  The goals are improved mobility/quality of life and decreased pain.  Past Medical History  Diagnosis Date  . Hypertension   . Anxiety   . Depression   . Sleep apnea     cpap does not use, dentist made mouthipiece   . Arthritis     Past Surgical History  Procedure Date  . Wisdom tooth extraction   . Uterine fibroid surgery     History reviewed. No pertinent family history. Social History:  reports that she has never smoked. She has never used smokeless tobacco. She reports that she drinks alcohol. She reports that she does not use illicit drugs.  Allergies:  Allergies  Allergen Reactions  . Penicillins Other (See Comments)    Tongue swelling     Medications Prior to Admission  Medication Sig Dispense Refill  . Cholecalciferol (VITAMIN D3) 5000 UNITS TABS Take 10,000 mg by mouth every morning.      Marland Kitchen CINNAMON PO Take 4,000 mg by mouth every morning.      . clorazepate (TRANXENE) 7.5 MG tablet Take 11.25 mg by mouth at bedtime. Pt takes 1 and 1/2 tablet for a total dose of 11.25mg       . DULoxetine (CYMBALTA) 60 MG capsule Take 60 mg by mouth at bedtime.      . gabapentin (NEURONTIN) 300 MG capsule Take 300 mg by mouth 2 (two) times daily.      Marland Kitchen L-Methylfolate-Algae (DEPLIN 15 PO) Take 15 mg by mouth at bedtime.      Marland Kitchen losartan (COZAAR) 100 MG tablet Take 100 mg by mouth every morning.      . nabumetone (RELAFEN) 500 MG tablet Take 500 mg by mouth 2 (two) times daily.       . Omega-3 Fatty Acids (SUPER OMEGA 3 EPA/DHA PO) Take 3 capsules by mouth every morning.      . pravastatin (PRAVACHOL) 20 MG tablet  Take 20 mg by mouth daily.      Marland Kitchen PRESCRIPTION MEDICATION Take 2 tablets by mouth every morning. Pt takes a dietary supplement. Called hairgrow.  ( vitamin E,Folic acid, folate. Biotin)        Results for orders placed during the hospital encounter of 06/24/12 (from the past 48 hour(s))  TYPE AND SCREEN     Status: Normal   Collection Time   06/24/12  7:55 AM      Component Value Range Comment   ABO/RH(D) O POS      Antibody Screen NEG      Sample Expiration 06/27/2012     ABO/RH     Status: Normal   Collection Time   06/24/12  8:00 AM      Component Value Range Comment   ABO/RH(D) O POS      No results found.  Review of Systems  All other systems reviewed and are negative.    Blood pressure 147/97, pulse 103, temperature 97.3 F (36.3 C), temperature source Oral, resp. rate 18, SpO2 99.00%. Physical Exam  Constitutional: She is oriented to person, place, and time. She appears well-developed and well-nourished.  HENT:  Head: Normocephalic and atraumatic.  Eyes: EOM are normal. Pupils are equal, round, and reactive to light.  Neck: Normal range of motion. Neck supple.  Cardiovascular: Normal rate and regular rhythm.   Respiratory: Effort normal and breath sounds normal.  GI: Soft. Bowel sounds are normal.  Musculoskeletal:       Left hip: She exhibits decreased range of motion, decreased strength, bony tenderness and crepitus.  Neurological: She is alert and oriented to person, place, and time.  Skin: Skin is warm and dry.  Psychiatric: She has a normal mood and affect.     Assessment/Plan Severe end-stage OA of left hip with severe pain 1) to the OR today for a left total hip replacement.  BLACKMAN,CHRISTOPHER Y 06/24/2012, 9:11 AM

## 2012-06-24 NOTE — Anesthesia Procedure Notes (Signed)
Procedure Name: Intubation Date/Time: 06/24/2012 9:25 AM Performed by: Doran Clay Pre-anesthesia Checklist: Patient identified, Timeout performed, Emergency Drugs available, Suction available and Patient being monitored Patient Re-evaluated:Patient Re-evaluated prior to inductionOxygen Delivery Method: Circle system utilized Preoxygenation: Pre-oxygenation with 100% oxygen Intubation Type: IV induction Laryngoscope Size: Mac and 4 Grade View: Grade I Tube type: Oral Tube size: 7.0 mm Number of attempts: 1 Airway Equipment and Method: Stylet Placement Confirmation: ETT inserted through vocal cords under direct vision,  breath sounds checked- equal and bilateral and positive ETCO2 Secured at: 21 cm Tube secured with: Tape Dental Injury: Teeth and Oropharynx as per pre-operative assessment

## 2012-06-24 NOTE — Brief Op Note (Signed)
06/24/2012  11:35 AM  PATIENT:  Molly Lucas  66 y.o. female  PRE-OPERATIVE DIAGNOSIS:  Osteoarthritis Left Hip  POST-OPERATIVE DIAGNOSIS:  Osteoarthritis Left Hip  PROCEDURE:  Procedure(s) (LRB): TOTAL HIP ARTHROPLASTY ANTERIOR APPROACH (Left)  SURGEON:  Surgeon(s) and Role:    * Kathryne Hitch, MD - Primary  PHYSICIAN ASSISTANT:   ASSISTANTS: Maud Deed, PA-C   ANESTHESIA:   general  EBL:  Total I/O In: 2850 [I.V.:2000; Blood:350; IV Piggyback:500] Out: 1650 [Urine:150; Blood:1500]  BLOOD ADMINISTERED:none  DRAINS: none   LOCAL MEDICATIONS USED:  NONE  SPECIMEN:  No Specimen  DISPOSITION OF SPECIMEN:  N/A  COUNTS:  YES  TOURNIQUET:  * No tourniquets in log *  DICTATION: .Other Dictation: Dictation Number [id number]  PLAN OF CARE: Admit to inpatient   PATIENT DISPOSITION:  PACU - hemodynamically stable.   Delay start of Pharmacological VTE agent (>24hrs) due to surgical blood loss or risk of bleeding: no

## 2012-06-24 NOTE — Anesthesia Postprocedure Evaluation (Signed)
  Anesthesia Post-op Note  Patient: Molly Lucas  Procedure(s) Performed: Procedure(s) (LRB): TOTAL HIP ARTHROPLASTY ANTERIOR APPROACH (Left)  Patient Location: PACU  Anesthesia Type: General  Level of Consciousness: awake and alert   Airway and Oxygen Therapy: Patient Spontanous Breathing  Post-op Pain: mild  Post-op Assessment: Post-op Vital signs reviewed, Patient's Cardiovascular Status Stable, Respiratory Function Stable, Patent Airway and No signs of Nausea or vomiting  Post-op Vital Signs: stable  Complications: No apparent anesthesia complications

## 2012-06-24 NOTE — Progress Notes (Signed)
Utilization review completed.  

## 2012-06-25 LAB — BASIC METABOLIC PANEL
BUN: 12 mg/dL (ref 6–23)
Chloride: 103 mEq/L (ref 96–112)
GFR calc non Af Amer: >90 mL/min (ref >90)
Glucose, Bld: 115 mg/dL — ABNORMAL HIGH (ref 70–99)
Potassium: 4.1 mEq/L (ref 3.5–5.1)
Sodium: 137 mEq/L (ref 135–145)

## 2012-06-25 LAB — CBC
HCT: 28.3 % — ABNORMAL LOW (ref 36.0–46.0)
Hemoglobin: 9.8 g/dL — ABNORMAL LOW (ref 12.0–15.0)
MCHC: 34.6 g/dL (ref 30.0–36.0)
RBC: 3.03 MIL/uL — ABNORMAL LOW (ref 3.87–5.11)
WBC: 9.7 10*3/uL (ref 4.0–10.5)

## 2012-06-25 NOTE — Progress Notes (Signed)
Clinical Social Work Department BRIEF PSYCHOSOCIAL ASSESSMENT 06/25/2012  Patient:  Molly Lucas, Molly Lucas     Account Number:  [id number]     Admit date:  06/24/2012  Clinical Social Worker:  Leron Croak, CLINICAL SOCIAL WORKER  Date/Time:  06/25/2012 06:01 PM  Referred by:  Physician  Date Referred:  06/25/2012 Referred for  SNF Placement   Other Referral:   Interview type:  Patient Other interview type:    PSYCHOSOCIAL DATA Living Status:  ALONE Admitted from facility:   Level of care:   Primary support name:  [name] Primary support relationship to patient:  CHILD, ADULT Degree of support available:   good    CURRENT CONCERNS Current Concerns  Post-Acute Placement   Other Concerns:    SOCIAL WORK ASSESSMENT / PLAN CSW met with the Pt who stated that she had visited [address] and the Pt will be completing her rehab at that facility.   Assessment/plan status:  Information/Referral to [facility] Other assessment/ plan:   Information/referral to community resources:   none needed    PATIENT'S/FAMILY'S RESPONSE TO PLAN OF CARE: Pt appreciative for assitance.      Leron Croak, LCSWA [employer] [phone]

## 2012-06-25 NOTE — Evaluation (Signed)
Physical Therapy Evaluation Patient Details Name: Molly Lucas MRN: [id number] DOB: February 20, 1946 Today's Date: 06/25/2012 Time: 4098-1191 PT Time Calculation (min): 23 min  PT Assessment / Plan / Recommendation Clinical Impression  Pt s/p L direct anterior THR.  Pt would benefit from acute PT services in order to improve independence with transfers and ambulation to prepare for d/c to SNF.      PT Assessment  Patient needs continued PT services    Follow Up Recommendations  Skilled nursing facility    Barriers to Discharge        Equipment Recommendations  Defer to next venue    Recommendations for Other Services     Frequency 7X/week    Precautions / Restrictions Precautions Precautions: None Restrictions Weight Bearing Restrictions: No   Pertinent Vitals/Pain 5/10 L hip with ambulation      Mobility  Bed Mobility Bed Mobility: Supine to Sit Supine to Sit: 4: Min assist Details for Bed Mobility Assistance: assist for L LE Transfers Transfers: Stand to Sit;Sit to Stand Sit to Stand: 4: Min assist;With upper extremity assist;From bed Stand to Sit: 4: Min guard;With upper extremity assist;To chair/3-in-1 Details for Transfer Assistance: verbal cues for safe technique, assist to rise Ambulation/Gait Ambulation/Gait Assistance: 4: Min guard Ambulation Distance (Feet): 100 Feet Assistive device: Rolling walker Ambulation/Gait Assistance Details: verbal cues for sequence and safe use of RW, pt reports 5/10 L hip pain with ambulation Gait Pattern: Step-to pattern;Decreased stride length    Exercises     PT Diagnosis: Difficulty walking;Acute pain  PT Problem List: Decreased strength;Decreased range of motion;Decreased activity tolerance;Decreased mobility;Decreased knowledge of use of DME;Pain PT Treatment Interventions: DME instruction;Gait training;Functional mobility training;Therapeutic activities;Therapeutic exercise;Patient/family education   PT Goals Acute  Rehab PT Goals PT Goal Formulation: With patient Time For Goal Achievement: 06/29/12 Potential to Achieve Goals: Good Pt will go Supine/Side to Sit: with supervision PT Goal: Supine/Side to Sit - Progress: Goal set today Pt will go Sit to Supine/Side: with supervision PT Goal: Sit to Supine/Side - Progress: Goal set today Pt will go Sit to Stand: with supervision PT Goal: Sit to Stand - Progress: Goal set today Pt will go Stand to Sit: with supervision PT Goal: Stand to Sit - Progress: Goal set today Pt will Ambulate: with supervision;>150 feet;with least restrictive assistive device PT Goal: Ambulate - Progress: Goal set today  Visit Information  Last PT Received On: 06/25/12 Assistance Needed: +1    Subjective Data  Subjective: I plan on going to [facility].   Prior Functioning  Home Living Lives With: Alone Type of Home: Skilled Nursing Facility Prior Function Level of Independence: Independent Communication Communication: No difficulties    Cognition  Overall Cognitive Status: Appears within functional limits for tasks assessed/performed Arousal/Alertness: Awake/alert Orientation Level: Appears intact for tasks assessed Behavior During Session: [facility] for tasks performed    Extremity/Trunk Assessment Right Upper Extremity Assessment RUE ROM/Strength/Tone: [facility] for tasks assessed Left Upper Extremity Assessment LUE ROM/Strength/Tone: WFL for tasks assessed Right Lower Extremity Assessment RLE ROM/Strength/Tone: [facility] for tasks assessed Left Lower Extremity Assessment LLE ROM/Strength/Tone: Deficits LLE ROM/Strength/Tone Deficits: decreased hip strength per functional observation   Balance    End of Session PT - End of Session Activity Tolerance: Patient tolerated treatment well Patient left: in chair;with call bell/phone within reach Nurse Communication: Mobility status (RN saw pt ambulating in hallway)  GP     Jabree Pernice,[name] 06/25/2012, 11:03 AM Pager:  [phone]

## 2012-06-25 NOTE — Plan of Care (Signed)
Problem: Phase I Progression Outcomes Goal: [place] evening of surgery Outcome: Completed/Met Date Met:  06/25/12 Per night RN's report.

## 2012-06-25 NOTE — Op Note (Signed)
NAMESTEVIE, Molly Lucas                ACCOUNT NO.:  [id number]  MEDICAL RECORD NO.:  [id number]  LOCATION:  1611                         FACILITY:  [facility]  PHYSICIAN:  Vanita Panda. Magnus Ivan, M.D.DATE OF BIRTH:  07/26/1946  DATE OF PROCEDURE:  06/24/2012 DATE OF DISCHARGE:                              OPERATIVE REPORT   PREOPERATIVE DIAGNOSIS:  Severe end-stage arthritis with bone-on-bone wear, left hip.  POSTOPERATIVE DIAGNOSIS:  Severe end-stage arthritis with bone-on-bone wear, left hip.  PROCEDURE:  Left total hip arthroplasty through direct anterior approach.  IMPLANTS:  DePuy sector Gription acetabular component size 56, size 36, +4 neutral polyethylene liner, Corail femoral component, size 10 with standard offset, size 36, +5 ceramic hip ball.  SURGEON:  Vanita Panda. Magnus Ivan, MD  ASSISTANT:  Wende Neighbors, PA-C, who was present and needed throughout the whole entire case.  ANESTHESIA:  General.  BLOOD LOSS:  1200 mL.  COMPLICATIONS:  None.  ANTIBIOTICS:  900 mg IV clindamycin.  INDICATIONS:  Molly Lucas is a 66 year old female with severe end-stage arthritis of her left hip.  She has had significant swelling and effusion of that hip.  X-rays show bone-on-bone wear.  She ambulates with a cane.  It has gotten to be where the pain is so debilitating, it is affecting her activities of daily living.  At this point, she wished to proceed with a total hip arthroplasty given the effects on her activities of daily living.  I talked to her about the risks and benefits of surgery in detail and she does wish to proceed.  PROCEDURE DESCRIPTION:  After informed consent was obtained, appropriate left leg was marked.  She was brought to the operating room.  General anesthesia was obtained while she was on a stretcher and a Foley catheter was placed as well.  Traction boots were placed on her feet and she was placed supine on the Hana fracture table with the perineal  post in place and both the legs and an inline skeletal traction.  Her left hip was then prepped and draped with DuraPrep and sterile drapes and we assessed this under fluoroscopy as well.  A time-out was called to identify correct patient, correct left hip.  I then made an incision just inferior and posterior to the anterior superior iliac spine and carried this down to the soft tissue and to the tensor fascia lata.  The tensor fascia was divided longitudinally and I proceeded with a direct anterior approach to the hip.  Cobra retractors were placed around the medial and lateral neck, and I cauterized the lateral femoral circumflex vessels, but she still had significant bleeding from these during several portions of the case.  She did remain stable throughout the case.  I made my neck cut then almost at the level of lesser trochanter because of such a short neck and was able to start this cut with an oscillating saw and completed with an osteotome, and then I removed the head in its entirety.  I cleaned the acetabular debris and she had significant sclerosis of the acetabulum, especially from superior far to lateral.  I reamed 2 mm increments from 44 all the way up to a  size 54 with last few under direct fluoroscopic guidance and I tried to place a 54 cup, but it was not seating at all.  I then needed to proceed up to a size 56 cup after I had failure of seating the 54.  Once I was able to seat the 56 acetabular component, I placed it without complications and placed 2 screws to help secure this further based on her sclerotic bone. The cup did not move.  I felt good about how bottom line had become and placed the real 36, +4 neutral polyethylene liner.  Attention was then turned to the femur.  I had externally rotated to 90 degrees, extended, and adducted  I placed a Mueller retractor medially and retracted into the greater trochanter.  I then used a box cutting guide to open up  the femoral canal, and I started with #8 broach and only broach up to a size 10.  I then trialed a standard neck and a 36, +1.5 hip ball.  We brought the leg back over and up and reduced into the acetabulum.  It had good shucking, good stability in internal and external rotation, but her leg lengths showed that she was still a little short.  I then trialed a 36, +5 hip ball and had much better length on that.  So, we re-dislocated the hip, removed all trial components.  I then placed the real size 10 femoral component with standard offset and a collar and the real 36, +5 ceramic hip ball.  We reduced this back in the acetabulum and I was pleased on our leg lengths as well as stability of the hip.  We then copiously irrigated the soft tissues and closed the joint capsule with #1 Ethibond suture followed by a running V-Loc suture in the tensor fascia lata, 2-0 Vicryl in subcutaneous tissue, and interrupted staples on the skin.  Well-padded sterile dressing was applied.  She was taken off the [facility] table.  Her leg lengths again which showed to be equal. She was taken to recovery room in stable condition.  Again, there was significant blood loss during the case, but she remained stable throughout and then I will plan on transfusing her starting in the PACU. Again note, Molly Deed, PA-C was needed and participated throughout the whole case.     Vanita Panda. Magnus Ivan, M.D.     CYB/MEDQ  D:  06/24/2012  T:  06/25/2012  Job:  [id number]

## 2012-06-25 NOTE — Progress Notes (Signed)
Subjective: 1 Day Post-Op Procedure(s) (LRB): TOTAL HIP ARTHROPLASTY ANTERIOR APPROACH (Left) Patient reports pain as moderate.    Objective: Vital signs in last 24 hours: Temp:  [97.4 F (36.3 C)-98.3 F (36.8 C)] 97.8 F (36.6 C) (08/17 1000) Pulse Rate:  [61-97] 74  (08/17 1000) Resp:  [10-20] 18  (08/17 1000) BP: (92-137)/(54-82) 104/74 mmHg (08/17 1000) SpO2:  [96 %-100 %] 100 % (08/17 1000) Weight:  [71.668 kg (158 lb)] 71.668 kg (158 lb) (08/16 1331)  Intake/Output from previous day: 08/16 0701 - 08/17 0700 In: 6692.8 [P.O.:1320; I.V.:4033.8; Blood:787; IV Piggyback:552] Out: 1610 [RUEAV:4098; Blood:1500] Intake/Output this shift:     Basename 06/25/12 0513  HGB 9.8*    Basename 06/25/12 0513  WBC 9.7  RBC 3.03*  HCT 28.3*  PLT 162    Basename 06/25/12 0513  NA 137  K 4.1  CL 103  CO2 31  BUN 12  CREATININE 0.64  GLUCOSE 115*  CALCIUM 8.4   No results found for this basename: LABPT:2,INR:2 in the last 72 hours  Sensation intact distally Intact pulses distally Dorsiflexion/Plantar flexion intact Incision: moderate drainage  Assessment/Plan: 1 Day Post-Op Procedure(s) (LRB): TOTAL HIP ARTHROPLASTY ANTERIOR APPROACH (Left) Up with therapy  Caelum Federici Y 06/25/2012, 12:02 PM

## 2012-06-25 NOTE — Progress Notes (Signed)
Physical Therapy Treatment Patient Details Name: Molly Lucas MRN: [id number] DOB: Jan 09, 1946 Today's Date: 06/25/2012 Time: 1510-1530 PT Time Calculation (min): 20 min  PT Assessment / Plan / Recommendation Comments on Treatment Session  Pt able to ambulate again in hallway this afternoon and then performed exercises.    Follow Up Recommendations  Skilled nursing facility    Barriers to Discharge        Equipment Recommendations  Defer to next venue    Recommendations for Other Services    Frequency     Plan Discharge plan remains appropriate;Frequency remains appropriate    Precautions / Restrictions Precautions Precautions: None Restrictions Weight Bearing Restrictions: No   Pertinent Vitals/Pain 3/10 L hip pain, repositioned, ice applied, RN reports not time for pain meds yet    Mobility  Bed Mobility Bed Mobility: Sit to Supine Supine to Sit: 4: Min assist;HOB flat;With rails Sit to Supine: 4: Min assist Details for Bed Mobility Assistance: verbal cues for technique, assist for L LE Transfers Transfers: Stand to Sit;Sit to Stand Sit to Stand: 4: Min assist;With upper extremity assist;With armrests;From chair/3-in-1 Stand to Sit: 4: Min guard;With upper extremity assist;With armrests;To bed Details for Transfer Assistance: verbal cues for safe technique Ambulation/Gait Ambulation/Gait Assistance: 4: Min guard Ambulation Distance (Feet): 80 Feet Assistive device: Rolling walker Ambulation/Gait Assistance Details: verbal cues for sequence and safe use of RW Gait Pattern: Step-to pattern;Decreased stride length    Exercises Total Joint Exercises Ankle Circles/Pumps: AROM;10 reps;Both Gluteal Sets: AROM;Both;10 reps Short Arc Quad: AROM;Strengthening;Left;10 reps;Supine Heel Slides: AAROM;Strengthening;Left;10 reps;Supine Hip ABduction/ADduction: AAROM;Strengthening;Left;10 reps;Supine Long Arc Quad: AROM;Strengthening;Left;10 reps;Seated   PT Diagnosis:      PT Problem List:   PT Treatment Interventions:     PT Goals Acute Rehab PT Goals PT Goal: Sit to Supine/Side - Progress: Progressing toward goal PT Goal: Sit to Stand - Progress: Progressing toward goal PT Goal: Stand to Sit - Progress: Progressing toward goal PT Goal: Ambulate - Progress: Progressing toward goal  Visit Information  Last PT Received On: 06/25/12 Assistance Needed: +1    Subjective Data  Subjective: This chair is really hurting my leg.  Can we walk a little?   Cognition  Overall Cognitive Status: Appears within functional limits for tasks assessed/performed Arousal/Alertness: Awake/alert Orientation Level: Appears intact for tasks assessed Behavior During Session: [facility] for tasks performed    Balance     End of Session PT - End of Session Activity Tolerance: Patient tolerated treatment well Patient left: in bed;with call bell/phone within reach Nurse Communication: Patient requests pain meds   GP     Lurie Mullane,[name] 06/25/2012, 4:15 PM Pager: [phone]

## 2012-06-25 NOTE — Evaluation (Signed)
Occupational Therapy Evaluation Patient Details Name: Molly Lucas MRN: [id number] DOB: 1946/07/25 Today's Date: 06/25/2012 Time: 4098-1191 OT Time Calculation (min): 24 min  OT Assessment / Plan / Recommendation Clinical Impression  Pt doing well POD 1 LTHR. Skilled OT indicated to maximize independence with BADLs to supervision level in prep for d/c to next venue of care.    OT Assessment  Patient needs continued OT Services    Follow Up Recommendations  Skilled nursing facility    Barriers to Discharge Decreased caregiver support    Equipment Recommendations  Defer to next venue    Recommendations for Other Services    Frequency  Min 1X/week    Precautions / Restrictions Precautions Precautions: None Restrictions Weight Bearing Restrictions: No   Pertinent Vitals/Pain Reported 7/10 pain with mobility. Repositioned for comfort.    ADL  Grooming: Performed;Wash/dry hands;Brushing hair;Teeth care Where Assessed - Grooming: Supported standing Lower Body Bathing: Minimal assistance;Simulated Where Assessed - Lower Body Bathing: Supported sit to stand Lower Body Dressing: Simulated;Moderate assistance Where Assessed - Lower Body Dressing: Supported sit to Pharmacist, hospital: Performed;Minimal Dentist Method: Sit to Barista: Raised toilet seat with arms (or 3-in-1 over toilet) Toileting - Clothing Manipulation and Hygiene: Simulated;Minimal assistance Where Assessed - Engineer, mining and Hygiene: Sit to stand from 3-in-1 or toilet Equipment Used: Rolling walker Transfers/Ambulation Related to ADLs: Pt ambulated to the bathroom with minguard A and cues for step sequence.    OT Diagnosis: Generalized weakness  OT Problem List: Decreased activity tolerance;Decreased knowledge of use of DME or AE;Pain OT Treatment Interventions:     OT Goals Acute Rehab OT Goals OT Goal Formulation: With patient Time For  Goal Achievement: 07/02/12 Potential to Achieve Goals: Good ADL Goals Pt Will Perform Grooming: with supervision;Standing at sink ADL Goal: Grooming - Progress: Goal set today Pt Will Perform Lower Body Bathing: with supervision;Sit to stand from chair;Sit to stand from bed ADL Goal: Lower Body Bathing - Progress: Goal set today Pt Will Perform Lower Body Dressing: with supervision;Sit to stand from bed;Sit to stand from chair ADL Goal: Lower Body Dressing - Progress: Goal set today Pt Will Transfer to Toilet: with supervision;3-in-1;Comfort height toilet;Ambulation ADL Goal: Toilet Transfer - Progress: Goal set today Pt Will Perform Toileting - Clothing Manipulation: with supervision;Standing ADL Goal: Toileting - Clothing Manipulation - Progress: Goal set today Pt Will Perform Toileting - Hygiene: with supervision;Sit to stand from 3-in-1/toilet ADL Goal: Toileting - Hygiene - Progress: Goal set today  Visit Information  Last OT Received On: 06/25/12 Assistance Needed: +1    Subjective Data  Subjective: It doesnt feel so bad when I stand up. Patient Stated Goal: Go to [facility] for rehab.   Prior Functioning  Vision/Perception  Home Living Lives With: Alone Type of Home: Skilled Nursing Facility Prior Function Level of Independence: Independent Driving: Yes Vocation: Retired Musician: No difficulties Dominant Hand: Right      Cognition  Overall Cognitive Status: Appears within functional limits for tasks assessed/performed Arousal/Alertness: Awake/alert Orientation Level: Appears intact for tasks assessed Behavior During Session: [facility] for tasks performed    Extremity/Trunk Assessment Right Upper Extremity Assessment RUE ROM/Strength/Tone: [facility] for tasks assessed Left Upper Extremity Assessment LUE ROM/Strength/Tone: WFL for tasks assessed Right Lower Extremity Assessment RLE ROM/Strength/Tone: [facility] for tasks assessed Left Lower Extremity  Assessment LLE ROM/Strength/Tone: Deficits LLE ROM/Strength/Tone Deficits: decreased hip strength per functional observation   Mobility Bed Mobility Bed Mobility: Supine to Sit Supine  to Sit: 4: Min assist;HOB flat;With rails Details for Bed Mobility Assistance: assist needed for LLE. Transfers Sit to Stand: 4: Min assist;With upper extremity assist;From bed;With armrests;From chair/3-in-1 Stand to Sit: 4: Min guard;With upper extremity assist;To chair/3-in-1;With armrests Details for Transfer Assistance: VCs for hand placement and LLE management.   Exercise    Balance    End of Session OT - End of Session Activity Tolerance: Patient tolerated treatment well Patient left: in chair;with call bell/phone within reach  GO     Joletta Manner A OTR/L [phone] 06/25/2012, 2:38 PM

## 2012-06-26 LAB — CBC
HCT: 26.5 % — ABNORMAL LOW (ref 36.0–46.0)
Hemoglobin: 8.8 g/dL — ABNORMAL LOW (ref 12.0–15.0)
MCH: 32 pg (ref 26.0–34.0)
MCHC: 33.2 g/dL (ref 30.0–36.0)
MCV: 96.4 fL (ref 78.0–100.0)
RBC: 2.75 MIL/uL — ABNORMAL LOW (ref 3.87–5.11)

## 2012-06-26 MED ORDER — OXYCODONE HCL 5 MG PO TABS
5.0000 mg | ORAL_TABLET | ORAL | Status: DC | PRN
  Administered 2012-06-26 – 2012-06-28 (×5): 5 mg via ORAL
  Filled 2012-06-26 (×2): qty 1
  Filled 2012-06-26: qty 2
  Filled 2012-06-26: qty 1

## 2012-06-26 NOTE — Progress Notes (Signed)
Subjective: Pt stable - feels like one pain pill will be better than two   Objective: Vital signs in last 24 hours: Temp:  [98.3 F (36.8 C)-99.1 F (37.3 C)] 98.6 F (37 C) (08/18 2105) Pulse Rate:  [77-96] 83  (08/18 1320) Resp:  [16-20] 16  (08/18 0551) BP: (82-108)/(43-69) 108/69 mmHg (08/18 2105) SpO2:  [93 %-98 %] 93 % (08/18 2105)  Intake/Output from previous day: 08/17 0701 - 08/18 0700 In: 2751 [P.O.:600; I.V.:2151] Out: 2800 [Urine:2800] Intake/Output this shift: Total I/O In: 480 [P.O.:480] Out: 750 [Urine:750]  Exam:  Neurovascular intact Sensation intact distally Intact pulses distally Dorsiflexion/Plantar flexion intact  Labs:  Basename 06/26/12 0437 06/25/12 0513  HGB 8.8* 9.8*    Basename 06/26/12 0437 06/25/12 0513  WBC 7.7 9.7  RBC 2.75* 3.03*  HCT 26.5* 28.3*  PLT 142* 162    Basename 06/25/12 0513  NA 137  K 4.1  CL 103  CO2 31  BUN 12  CREATININE 0.64  GLUCOSE 115*  CALCIUM 8.4   No results found for this basename: LABPT:2,INR:2 in the last 72 hours  Assessment/Plan: Pt stable - moving well - dc this week   DEAN,GREGORY SCOTT 06/26/2012, 9:41 PM

## 2012-06-26 NOTE — Progress Notes (Signed)
Physical Therapy Treatment Patient Details Name: Molly Lucas MRN: [id number] DOB: 04/18/1946 Today's Date: 06/26/2012 Time: 9528-4132 PT Time Calculation (min): 20 min  PT Assessment / Plan / Recommendation Comments on Treatment Session  Pt ambulated in hallway however "funny" in head due to pain meds requiring increased cues and assist for mobility.    Follow Up Recommendations  Skilled nursing facility    Barriers to Discharge        Equipment Recommendations  Defer to next venue    Recommendations for Other Services    Frequency     Plan Discharge plan remains appropriate;Frequency remains appropriate    Precautions / Restrictions Precautions Precautions: None Restrictions Weight Bearing Restrictions: No   Pertinent Vitals/Pain 6/10 L hip pain, premedicated, RN aware    Mobility  Bed Mobility Bed Mobility: Supine to Sit;Sit to Supine Supine to Sit: 4: Min assist Sit to Supine: 4: Min assist Details for Bed Mobility Assistance: verbal cues for technique, assist for L LE Transfers Transfers: Stand to Sit;Sit to Stand Sit to Stand: 4: Min assist;With upper extremity assist;From bed Stand to Sit: 4: Min assist;With upper extremity assist;To bed Details for Transfer Assistance: verbal cues for technique, pt requiring more assist today to due pain meds Ambulation/Gait Ambulation/Gait Assistance: 4: Min assist Ambulation Distance (Feet): 80 Feet Assistive device: Rolling walker Ambulation/Gait Assistance Details: assist for occasional unsteadiness, increased verbal cues for sequence, safe RW distance and keeping leg in neutral rotation (tends to ER) Gait Pattern: Step-to pattern;Decreased stride length    Exercises     PT Diagnosis:    PT Problem List:   PT Treatment Interventions:     PT Goals Acute Rehab PT Goals PT Goal: Supine/Side to Sit - Progress: Progressing toward goal PT Goal: Sit to Supine/Side - Progress: Progressing toward goal PT Goal: Sit to  Stand - Progress: Progressing toward goal PT Goal: Stand to Sit - Progress: Progressing toward goal PT Goal: Ambulate - Progress: Progressing toward goal  Visit Information  Last PT Received On: 06/26/12 Assistance Needed: +1    Subjective Data  Subjective: The pain meds are making everything slow.   Cognition  Overall Cognitive Status: Appears within functional limits for tasks assessed/performed    Balance     End of Session PT - End of Session Activity Tolerance: Patient tolerated treatment well Patient left: in bed;with call bell/phone within reach Nurse Communication: Patient requests pain meds (effect of pain meds)   GP     Molly Lucas,Molly Lucas 06/26/2012, 10:51 AM Pager: [phone]

## 2012-06-26 NOTE — Progress Notes (Signed)
Physical Therapy Treatment Patient Details Name: Molly Lucas MRN: [id number] DOB: [birth year] Today's Date: 06/26/2012 Time: 4098-1191 PT Time Calculation (min): 20 min  PT Assessment / Plan / Recommendation Comments on Treatment Session  Pt with low BP per RN, gait therefore deferred, bed exercises only. Pt groggy, likely due to pain meds, but puts forth good effort with ther ex.    Follow Up Recommendations  Skilled nursing facility    Barriers to Discharge        Equipment Recommendations  Defer to next venue    Recommendations for Other Services    Frequency 7X/week   Plan Discharge plan remains appropriate;Frequency remains appropriate    Precautions / Restrictions Precautions Precautions: None Restrictions Other Position/Activity Restrictions: WBAT   Pertinent Vitals/Pain *No c/o pain Ice applied to L hip after therapeutic exercise.**    Mobility       Exercises Total Joint Exercises Ankle Circles/Pumps: AROM;10 reps;Both;Supine Quad Sets: AROM;Both;10 reps;Supine Towel Squeeze: AROM;Both;10 reps;Supine Short Arc Quad: AROM;Strengthening;Left;10 reps;Supine Heel Slides: AAROM;Strengthening;Left;10 reps;Supine Hip ABduction/ADduction: AAROM;Strengthening;Left;10 reps;Supine   PT Diagnosis:    PT Problem List:   PT Treatment Interventions:     PT Goals    Visit Information  Last PT Received On: 06/26/12 Assistance Needed: +1    Subjective Data  Subjective: The pain medicine is making me sleepy.    Cognition  Overall Cognitive Status: Appears within functional limits for tasks assessed/performed Arousal/Alertness: Lethargic (likely due to pain meds) Orientation Level: Appears intact for tasks assessed Behavior During Session: [facility] for tasks performed    Balance     End of Session PT - End of Session Activity Tolerance: Patient tolerated treatment well Patient left: in bed;with call bell/phone within reach;with family/visitor present Nurse  Communication: Patient requests pain meds (effect of pain meds)   GP     Tamala Ser 06/26/2012, 3:00 PM

## 2012-06-26 NOTE — Progress Notes (Signed)
Clinical Social Work Department CLINICAL SOCIAL WORK PLACEMENT NOTE 06/26/2012  Patient:  Molly Lucas, Molly Lucas  Account Number:  [id number] Admit date:  06/24/2012  Clinical Social Worker:  Leron Croak, CLINICAL SOCIAL WORKER  Date/time:  06/26/2012 09:09 AM  Clinical Social Work is seeking post-discharge placement for this patient at the following level of care:   SKILLED NURSING   (*CSW will update this form in Epic as items are completed)   06/25/2012  Patient/family provided with [facility] Department of Clinical Social Work's list of facilities offering this level of care within the geographic area requested by the patient (or if unable, by the patient's family).  06/25/2012  Patient/family informed of their freedom to choose among providers that offer the needed level of care, that participate in Medicare, Medicaid or managed care program needed by the patient, have an available bed and are willing to accept the patient.  06/25/2012  Patient/family informed of [facility]' ownership interest in [facility], as well as of the fact that they are under no obligation to receive care at this facility.  PASARR submitted to EDS on 06/26/2012 PASARR number received from [employer] on 06/26/2012  FL2 transmitted to all facilities in geographic area requested by pt/family on  06/26/2012 FL2 transmitted to all facilities within larger geographic area on 06/26/2012  Patient informed that his/her managed care company has contracts with or will negotiate with  certain facilities, including the following:     Patient/family informed of bed offers received:  06/26/2012 Patient chooses bed at [facility] Physician recommends and patient chooses bed at    Patient to be transferred [facility] on   Patient to be transferred to facility by [facility]  The following physician request were entered in Epic:   Additional Comments:

## 2012-06-27 LAB — CBC
HCT: 23.2 % — ABNORMAL LOW (ref 36.0–46.0)
MCH: 33.2 pg (ref 26.0–34.0)
MCHC: 34.5 g/dL (ref 30.0–36.0)
MCV: 96.3 fL (ref 78.0–100.0)
Platelets: 132 10*3/uL — ABNORMAL LOW (ref 150–400)
RDW: 14 % (ref 11.5–15.5)
WBC: 6.4 10*3/uL (ref 4.0–10.5)

## 2012-06-27 LAB — POCT I-STAT 4, (NA,K, GLUC, HGB,HCT): Hemoglobin: 7.5 g/dL — ABNORMAL LOW (ref 12.0–15.0)

## 2012-06-27 MED ORDER — LIP MEDEX EX OINT: TOPICAL_OINTMENT | CUTANEOUS | Status: DC | PRN

## 2012-06-27 MED ORDER — BISACODYL 10 MG RE SUPP
10.0000 mg | Freq: Every day | RECTAL | Status: DC | PRN
  Filled 2012-06-27: qty 1

## 2012-06-27 MED ORDER — MAGNESIUM HYDROXIDE 400 MG/5ML PO SUSP: 15.0000 mL | Freq: Every day | ORAL | Status: DC | PRN

## 2012-06-27 NOTE — Progress Notes (Signed)
Subjective: 3 Days Post-Op Procedure(s) (LRB): TOTAL HIP ARTHROPLASTY ANTERIOR APPROACH (Left) Patient reports pain as moderate.  Acute blood loss anemia that is a little symptomatic with fatigue.  Objective: Vital signs in last 24 hours: Temp:  [97.4 F (36.3 C)-98.6 F (37 C)] 97.4 F (36.3 C) (08/19 0519) Pulse Rate:  [77-83] 77  (08/19 0519) BP: (82-108)/(43-69) 92/58 mmHg (08/19 0519) SpO2:  [92 %-98 %] 92 % (08/19 0519)  Intake/Output from previous day: 08/18 0701 - 08/19 0700 In: 1870 [P.O.:960; I.V.:910] Out: 2250 [Urine:2250] Intake/Output this shift: Total I/O In: 480 [P.O.:480] Out: 750 [Urine:750]   Basename 06/27/12 0435 06/26/12 0437 06/25/12 0513  HGB 8.0* 8.8* 9.8*    Basename 06/27/12 0435 06/26/12 0437  WBC 6.4 7.7  RBC 2.41* 2.75*  HCT 23.2* 26.5*  PLT 132* 142*    Basename 06/25/12 0513  NA 137  K 4.1  CL 103  CO2 31  BUN 12  CREATININE 0.64  GLUCOSE 115*  CALCIUM 8.4   No results found for this basename: LABPT:2,INR:2 in the last 72 hours  Sensation intact distally Intact pulses distally Dorsiflexion/Plantar flexion intact Incision: scant drainage  Assessment/Plan: 3 Days Post-Op Procedure(s) (LRB): TOTAL HIP ARTHROPLASTY ANTERIOR APPROACH (Left) Discharge to SNF tomorrow. Transfuse 1 unit of blood today.  Kathryne Hitch 06/27/2012, 6:42 AM

## 2012-06-27 NOTE — Progress Notes (Addendum)
Physical Therapy Treatment Patient Details Name: Molly Lucas MRN: [id number] DOB: Jul 26, 1946 Today's Date: 06/27/2012 Time: 5784-6962 (Some indirect time while pt on 3in1) PT Time Calculation (min): 49 min  PT Assessment / Plan / Recommendation Comments on Treatment Session  Pt doing better this afternoon, having received a unit of PRBC in am.  No c/o dizziness/nausea.     Follow Up Recommendations  Skilled nursing facility    Barriers to Discharge        Equipment Recommendations  Defer to next venue    Recommendations for Other Services    Frequency 7X/week   Plan Discharge plan remains appropriate;Frequency remains appropriate    Precautions / Restrictions Precautions Precautions: None Restrictions Weight Bearing Restrictions: No Other Position/Activity Restrictions: WBAT   Pertinent Vitals/Pain 6/10, repositioned in bed    Mobility  Bed Mobility Bed Mobility: Supine to Sit;Sit to Supine Supine to Sit: 4: Min assist Sit to Supine: 4: Min assist Details for Bed Mobility Assistance: Assist for LLE out of and into bed with cues for hand placement to self assist trunk.  Transfers Transfers: Stand to Sit;Sit to Stand Sit to Stand: 4: Min assist;With upper extremity assist;From bed Stand to Sit: 4: Min guard;To elevated surface;To bed;To chair/3-in-1 Details for Transfer Assistance: Performed x 2 in order to use 3in1.   Ambulation/Gait Ambulation/Gait Assistance: 4: Min guard Ambulation Distance (Feet): 140 Feet Assistive device: Rolling walker Ambulation/Gait Assistance Details: Cues for sequencing/technique with RW and for upright posture.  Gait Pattern: Step-to pattern;Decreased stride length    Exercises Total Joint Exercises Ankle Circles/Pumps: AROM;Both;20 reps Quad Sets: AROM;Both;10 reps;Supine Short Arc Quad: AROM;Strengthening;Left;10 reps;Supine Heel Slides: AAROM;Strengthening;Left;10 reps;Supine Hip ABduction/ADduction: AAROM;Strengthening;Left;10  reps;Supine   PT Diagnosis:    PT Problem List:   PT Treatment Interventions:     PT Goals Acute Rehab PT Goals PT Goal Formulation: With patient Time For Goal Achievement: 06/29/12 Potential to Achieve Goals: Good Pt will go Supine/Side to Sit: with supervision PT Goal: Supine/Side to Sit - Progress: Progressing toward goal Pt will go Sit to Supine/Side: with supervision PT Goal: Sit to Supine/Side - Progress: Progressing toward goal Pt will go Sit to Stand: with supervision PT Goal: Sit to Stand - Progress: Progressing toward goal Pt will go Stand to Sit: with supervision PT Goal: Stand to Sit - Progress: Progressing toward goal Pt will Ambulate: with supervision;>150 feet;with least restrictive assistive device PT Goal: Ambulate - Progress: Progressing toward goal  Visit Information  Last PT Received On: 06/27/12 Assistance Needed: +1    Subjective Data  Subjective: I'm feeling a little better   Cognition  Overall Cognitive Status: Appears within functional limits for tasks assessed/performed Arousal/Alertness: Awake/alert Orientation Level: Appears intact for tasks assessed Behavior During Session: [facility] for tasks performed    Balance     End of Session PT - End of Session Activity Tolerance: Patient tolerated treatment well Patient left: in bed;with call bell/phone within reach Nurse Communication: Mobility status   GP     Page, Meribeth Mattes 06/27/2012, 2:03 PM

## 2012-06-27 NOTE — Progress Notes (Signed)
SNF bed available at [facility] when pt is stable for d/c. CSW will continue to follow to assist with d/c planning to SNF.  Cori Razor LCSW [phone]

## 2012-06-27 NOTE — Progress Notes (Signed)
Occupational Therapy Note Attempted to see pt for OT but she was finishing eating lunch and had just finished up her unit of blood and requested to try back in pm. Will reattempt as able. Molly Lucas OTR/L [phone] 06/27/2012

## 2012-06-28 DIAGNOSIS — D62 Acute posthemorrhagic anemia: Secondary | ICD-10-CM | POA: Diagnosis not present

## 2012-06-28 DIAGNOSIS — F329 Major depressive disorder, single episode, unspecified: Secondary | ICD-10-CM | POA: Diagnosis not present

## 2012-06-28 DIAGNOSIS — M1990 Unspecified osteoarthritis, unspecified site: Secondary | ICD-10-CM | POA: Diagnosis not present

## 2012-06-28 DIAGNOSIS — M1610 Unilateral primary osteoarthritis, unspecified hip: Secondary | ICD-10-CM | POA: Diagnosis not present

## 2012-06-28 DIAGNOSIS — M7989 Other specified soft tissue disorders: Secondary | ICD-10-CM | POA: Diagnosis not present

## 2012-06-28 DIAGNOSIS — D649 Anemia, unspecified: Secondary | ICD-10-CM | POA: Diagnosis not present

## 2012-06-28 DIAGNOSIS — F411 Generalized anxiety disorder: Secondary | ICD-10-CM | POA: Diagnosis not present

## 2012-06-28 DIAGNOSIS — Z96649 Presence of unspecified artificial hip joint: Secondary | ICD-10-CM | POA: Diagnosis not present

## 2012-06-28 DIAGNOSIS — E785 Hyperlipidemia, unspecified: Secondary | ICD-10-CM | POA: Diagnosis not present

## 2012-06-28 DIAGNOSIS — S79919A Unspecified injury of unspecified hip, initial encounter: Secondary | ICD-10-CM | POA: Diagnosis not present

## 2012-06-28 DIAGNOSIS — I10 Essential (primary) hypertension: Secondary | ICD-10-CM | POA: Diagnosis not present

## 2012-06-28 DIAGNOSIS — G4730 Sleep apnea, unspecified: Secondary | ICD-10-CM | POA: Diagnosis not present

## 2012-06-28 DIAGNOSIS — M25559 Pain in unspecified hip: Secondary | ICD-10-CM | POA: Diagnosis not present

## 2012-06-28 DIAGNOSIS — L03119 Cellulitis of unspecified part of limb: Secondary | ICD-10-CM | POA: Diagnosis not present

## 2012-06-28 DIAGNOSIS — F3289 Other specified depressive episodes: Secondary | ICD-10-CM | POA: Diagnosis not present

## 2012-06-28 DIAGNOSIS — E7800 Pure hypercholesterolemia, unspecified: Secondary | ICD-10-CM | POA: Diagnosis not present

## 2012-06-28 DIAGNOSIS — Z5189 Encounter for other specified aftercare: Secondary | ICD-10-CM | POA: Diagnosis not present

## 2012-06-28 DIAGNOSIS — L02419 Cutaneous abscess of limb, unspecified: Secondary | ICD-10-CM | POA: Diagnosis not present

## 2012-06-28 LAB — TYPE AND SCREEN
ABO/RH(D): O POS
Unit division: 0

## 2012-06-28 MED ORDER — FERROUS SULFATE 325 (65 FE) MG PO TABS: 325.0000 mg | ORAL_TABLET | Freq: Three times a day (TID) | ORAL | Status: DC

## 2012-06-28 MED ORDER — ASPIRIN 325 MG PO TBEC: 325.0000 mg | DELAYED_RELEASE_TABLET | Freq: Two times a day (BID) | ORAL | Status: AC

## 2012-06-28 MED ORDER — OXYCODONE-ACETAMINOPHEN 5-325 MG PO TABS: 1.0000 | ORAL_TABLET | ORAL | Status: AC | PRN

## 2012-06-28 MED ORDER — METHOCARBAMOL 500 MG PO TABS: 500.0000 mg | ORAL_TABLET | Freq: Four times a day (QID) | ORAL | Status: DC | PRN

## 2012-06-28 NOTE — Discharge Summary (Signed)
Patient ID: GAL SMOLINSKI MRN: [id number] DOB/AGE: Jul 31, 1946 66 y.o.  Admit date: 06/24/2012 Discharge date: 06/28/2012  Admission Diagnoses:  Principal Problem:  *Degenerative arthritis of hip   Discharge Diagnoses:  Same  Past Medical History  Diagnosis Date  . Hypertension   . Anxiety   . Depression   . Sleep apnea     cpap does not use, dentist made mouthipiece   . Arthritis     Surgeries: Procedure(s): TOTAL HIP ARTHROPLASTY ANTERIOR APPROACH on 06/24/2012   Consultants:    Discharged Condition: Improved  Hospital Course: TAISLEY MORDAN is an 66 y.o. female who was admitted 06/24/2012 for operative treatment ofDegenerative arthritis of hip. Patient has severe unremitting pain that affects sleep, daily activities, and work/hobbies. After pre-op clearance the patient was taken to the operating room on 06/24/2012 and underwent  Procedure(s): TOTAL HIP ARTHROPLASTY ANTERIOR APPROACH.    Patient was given perioperative antibiotics: Anti-infectives     Start     Dose/Rate Route Frequency Ordered Stop   06/24/12 1530   clindamycin (CLEOCIN) IVPB 600 mg        600 mg 100 mL/hr over 30 Minutes Intravenous Every 6 hours 06/24/12 1340 06/24/12 2223   06/24/12 0733   clindamycin (CLEOCIN) IVPB 900 mg        900 mg 100 mL/hr over 30 Minutes Intravenous 60 min pre-op 06/24/12 0733 06/24/12 0926           Patient was given sequential compression devices, early ambulation, and chemoprophylaxis to prevent DVT.  Patient benefited maximally from hospital stay and there were no complications.    Recent vital signs: Patient Vitals for the past 24 hrs:  BP Temp Temp src Pulse Resp SpO2  06/28/12 0628 100/69 mmHg 98.1 F (36.7 C) - 86  18  95 %  06/28/12 0349 - - - - 16  99 %  06/28/12 0000 - - - - 16  99 %  06/27/12 2200 114/77 mmHg 99.6 F (37.6 C) - 91  16  99 %  06/27/12 2000 - - - - 16  99 %  06/27/12 1400 86/51 mmHg 97.9 F (36.6 C) - 91  16  97 %  06/27/12 1230  114/77 mmHg 97.7 F (36.5 C) Oral 85  18  -  06/27/12 1130 106/70 mmHg 97.6 F (36.4 C) Oral 80  20  -  06/27/12 1030 98/65 mmHg 97.4 F (36.3 C) Oral 72  20  -  06/27/12 0930 99/63 mmHg 98 F (36.7 C) Oral 80  20  -  06/27/12 0907 102/65 mmHg 97.4 F (36.3 C) Oral 84  20  95 %     Recent laboratory studies:  Basename 06/27/12 0435 07/23/2012 0437  WBC 6.4 7.7  HGB 8.0* 8.8*  HCT 23.2* 26.5*  PLT 132* 142*  NA -- --  K -- --  CL -- --  CO2 -- --  BUN -- --  CREATININE -- --  GLUCOSE -- --  INR -- --  CALCIUM -- --     Discharge Medications:   Medication List  As of 06/28/2012  7:04 AM   TAKE these medications         aspirin 325 MG EC tablet   Take 1 tablet (325 mg total) by mouth 2 (two) times daily.      CINNAMON PO   Take 4,000 mg by mouth every morning.      clorazepate 7.5 MG tablet   Commonly known as: TRANXENE  Take 11.25 mg by mouth at bedtime. Pt takes 1 and 1/2 tablet for a total dose of 11.25mg       DEPLIN 15 PO   Take 15 mg by mouth at bedtime.      DULoxetine 60 MG capsule   Commonly known as: CYMBALTA   Take 60 mg by mouth at bedtime.      ferrous sulfate 325 (65 FE) MG tablet   Take 1 tablet (325 mg total) by mouth 3 (three) times daily after meals.      gabapentin 300 MG capsule   Commonly known as: NEURONTIN   Take 300 mg by mouth 2 (two) times daily.      losartan 100 MG tablet   Commonly known as: COZAAR   Take 100 mg by mouth every morning.      methocarbamol 500 MG tablet   Commonly known as: ROBAXIN   Take 1 tablet (500 mg total) by mouth every 6 (six) hours as needed.      nabumetone 500 MG tablet   Commonly known as: RELAFEN   Take 500 mg by mouth 2 (two) times daily.      oxyCODONE-acetaminophen 5-325 MG per tablet   Commonly known as: PERCOCET/ROXICET   Take 1-2 tablets by mouth every 4 (four) hours as needed for pain.      pravastatin 20 MG tablet   Commonly known as: PRAVACHOL   Take 20 mg by mouth daily.       PRESCRIPTION MEDICATION   Take 2 tablets by mouth every morning. Pt takes a dietary supplement. Called hairgrow.  ( vitamin E,Folic acid, folate. Biotin)      SUPER OMEGA 3 EPA/DHA PO   Take 3 capsules by mouth every morning.      Vitamin D3 5000 UNITS Tabs   Take 10,000 mg by mouth every morning.            Diagnostic Studies: Dg Chest 2 View  06/16/2012  *RADIOLOGY REPORT*  Clinical Data: Preoperative evaluation.  History of hypertension.  CHEST - 2 VIEW  Comparison: None.  Findings: Heart and mediastinal contours are within normal limits. The lung fields appear clear with no signs of focal infiltrate or congestive failure.  No pleural fluid or significant peribronchial cuffing is seen.  Bony structures are notable for thoracolumbar rotoscoliosis with some associated degenerative change of the upper lumbar spine and are otherwise appear intact.  IMPRESSION: No worrisome focal or acute cardiopulmonary abnormality seen  Original Report Authenticated By: Bertha Stakes, M.D.   Dg Hip Complete Left  06/24/2012  *RADIOLOGY REPORT*  Clinical Data: Left anterior approach total hip replacement  LEFT HIP - COMPLETE 2+ VIEW  Comparison: None.  Findings: Two C-arm spot films show the femoral and acetabular components in good position.  No acute bony abnormality is seen.  IMPRESSION: Left total hip replacement in good position on C-arm views obtained.  Original Report Authenticated By: Juline Patch, M.D.   Dg Pelvis Portable  06/24/2012  *RADIOLOGY REPORT*  Clinical Data: Left hip replacement  PORTABLE PELVIS  Comparison: Spot fluoro films from earlier the same day  Findings: Single portable view of the lower pelvis and hips shows the patient to be immediately status post total hip replacement. No evidence for immediate hardware complications.  Gas within the adjacent soft tissues is compatible with immediate postoperative state.  IMPRESSION: No hardware complications visible status post left total  hip replacement.  Original Report Authenticated By: ERIC A. MANSELL, M.D.  Dg Bone Density  06/15/2012  *RADIOLOGY REPORT*  Clinical Data: 66 year old postmenopausal female with depression, sleep apnea and elevated blood pressure.  The patient is scheduled for left hip replacement on 06/24/2012.  She takes calcium and vitamin D.  She took hormones for 16 years in the past.  DUAL X-RAY ABSORPTIOMETRY (DXA) FOR BONE MINERAL DENSITY  RIGHT FEMUR (NECK)  Bone Mineral Density (BMD):             0.803 g/cm2 Young Adult T Score:                           -0.4 Z Score:                                                 1.3  LEFT FOREARM (1/3 RADIUS)  Bone Mineral Density (BMD):                     0.690 g/cm2 Young Adult T Score:                                  -0.1 Z Score:                            1.8  ASSESSMENT:  Patient's diagnostic category is NORMAL by WHO Criteria.  FRACTURE RISK: NOT INCREASED  FRAX: Not applicable  Spine is not included due to scoliosis and degenerative change.  Comparison: None.  Please note that it is not possible to compare data from different instruments.  RECOMMENDATIONS:  Effective therapies are available in the form of bisphosphonates, selective estrogen receptor modulators, biologic agents, and hormone replacement therapy (for women).  All patients should ensure an adequate intake of dietary calcium (1200mg  daily) and vitamin D (800 IU daily) unless contraindicated.  All treatment decisions require clinical judgement and consideration of individual patient factors, including patient preferences, co-morbidities, previous drug use, risk factors not captured in the FRAX model (e.g., frailty, falls, vitamin D deficiency, increased bone turnover, interval significant decline in bone density) and possible under-or over-estimation of fracture risk by FRAX.  The National Osteoporosis Foundation recommends that FDA-approved medical therapies be considered in postmenopausal women and mean age 15  or older with a:        1)     Hip or vertebral (clinical or morphometric) fracture.           2)    T-score of -2.5 or lower at the spine or hip. 3)    Ten-year fracture probability by FRAX of 3% or greater for hip fracture or 20% or greater for major osteoporotic fracture. FOLLOW-UP:  People with diagnosed cases of osteoporosis or at high risk for fracture should have regular bone mineral density tests.  For patients eligible for Medicare, routine testing is allowed once every 2 years.  The testing frequency can be increased to one year for patients who have rapidly progressing disease, those who are receiving or discontinuing medical therapy to restore bone mass, or have additional risk factors.  World Health Organization [facility]) Criteria:  Normal: T scores from +1.0 to -1.0 Low Bone Mass (Osteopenia): T scores between -1.0 and -2.5 Osteoporosis: T scores -2.5 and below  Comparison to Reference Population:  T score is the key measure used in the diagnosis of osteoporosis and relative risk determination for fracture.  It provides a value for bone mass relative to the mean bone mass of a young adult reference population expressed in terms of standard deviation (SD).  Z score is the age-matched score showing the patient's values compared to a population matched for age, sex, and race.  This is also expressed in terms of standard deviation.  The patient may have values that compare favorably to the age-matched values and still be at increased risk for fracture.  Original Report Authenticated By: Daryl Eastern, M.D.   Dg Hip Portable 1 View Left  06/24/2012  *RADIOLOGY REPORT*  Clinical Data: Hip pain  PORTABLE LEFT HIP - 1 VIEW  Comparison: C-arm films earlier in the day  Findings: A cross-table lateral view demonstrates left T H R good position.  IMPRESSION: As above.  Original Report Authenticated By: Elsie Stain, M.D.   Mm Digital Screening  06/17/2012  *RADIOLOGY REPORT*  Clinical Data: Screening.   DIGITAL BILATERAL SCREENING MAMMOGRAM WITH CAD DIGITAL BREAST TOMOSYNTHESIS  Digital breast tomosynthesis images are acquired in two projections.  These images are reviewed in combination with the digital mammogram, confirming the findings below.  Comparison:  Previous exams.  Findings:  There are scattered fibroglandular densities. No suspicious masses, architectural distortion, or calcifications are present.  Images were processed with CAD.  IMPRESSION: No mammographic evidence of malignancy.  A result letter of this screening mammogram will be mailed directly to the patient.  RECOMMENDATION: Screening mammogram in one year. (Code:SM-B-01Y)  BI-RADS CATEGORY 1:  Negative.  Original Report Authenticated By: Harrel Lemon, M.D.   Dg C-arm 61-120 Min-no Report  06/24/2012  CLINICAL DATA: surgery   C-ARM 61-120 MINUTES  Fluoroscopy was utilized by the requesting physician.  No radiographic  interpretation.      Disposition: skilled nursing  Discharge Orders    Future Orders Please Complete By Expires   Diet - low sodium heart healthy      Call MD / Call 911      Comments:   If you experience chest pain or shortness of breath, CALL 911 and be transported to the hospital emergency room.  If you develope a fever above 101 F, pus (white drainage) or increased drainage or redness at the wound, or calf pain, call your surgeon's office.   Constipation Prevention      Comments:   Drink plenty of fluids.  Prune juice may be helpful.  You may use a stool softener, such as Colace (over the counter) 100 mg twice a day.  Use MiraLax (over the counter) for constipation as needed.   Increase activity slowly as tolerated      Discharge instructions      Comments:   Full weight as tolerated left hip; no hip precautions. Increase activities as comfort allows. You can get your incision wet in the shower starting 06/29/12; daily dry dressing left hip. Follow-up at [facility] in 2 weeks ([phone])    Discharge patient            Signed: Kathryne Hitch 06/28/2012, 7:04 AM

## 2012-06-28 NOTE — Progress Notes (Signed)
Physical Therapy Treatment Patient Details Name: Molly Lucas MRN: [id number] DOB: 1946/05/17 Today's Date: 06/28/2012 Time: 4098-1191 PT Time Calculation (min): 21 min  PT Assessment / Plan / Recommendation Comments on Treatment Session  Pt continues to progress well with ambulation.  Ready for D/C to SNF this afternoon.     Follow Up Recommendations  Skilled nursing facility    Barriers to Discharge        Equipment Recommendations  Defer to next venue    Recommendations for Other Services    Frequency 7X/week   Plan Discharge plan remains appropriate;Frequency remains appropriate    Precautions / Restrictions Precautions Precautions: None Restrictions Weight Bearing Restrictions: No Other Position/Activity Restrictions: WBAT   Pertinent Vitals/Pain 6-7/10 with ambulation.      Mobility  Bed Mobility Bed Mobility: Supine to Sit Supine to Sit: 4: Min guard Details for Bed Mobility Assistance: Min/guard for safety of LLE out of bed with cues for hand placement on bed instead of rails to self assist to EOB.  Transfers Transfers: Stand to Sit;Sit to Stand Sit to Stand: 4: Min guard;From elevated surface;With upper extremity assist;From bed Stand to Sit: 4: Min guard;With upper extremity assist;With armrests;To chair/3-in-1 Details for Transfer Assistance: Performed x 2 in order to use restroom.  Cues for hand placement and LE management as needed when sitting/standing.  Ambulation/Gait Ambulation/Gait Assistance: 4: Min guard Ambulation Distance (Feet): 140 Feet (then another 12') Assistive device: Rolling walker Ambulation/Gait Assistance Details: Cues for upright posture, maintaining position inside of RW (esp with turns) and also for maintaining foot in neutral due to tendency to ER foot when walking.   Gait Pattern: Step-to pattern;Decreased stride length Gait velocity: decreased    Exercises     PT Diagnosis:    PT Problem List:   PT Treatment Interventions:      PT Goals Acute Rehab PT Goals PT Goal Formulation: With patient Time For Goal Achievement: 06/29/12 Potential to Achieve Goals: Good Pt will go Supine/Side to Sit: with supervision PT Goal: Supine/Side to Sit - Progress: Progressing toward goal Pt will go Sit to Stand: with supervision PT Goal: Sit to Stand - Progress: Progressing toward goal Pt will go Stand to Sit: with supervision PT Goal: Stand to Sit - Progress: Progressing toward goal Pt will Ambulate: with supervision;>150 feet;with least restrictive assistive device PT Goal: Ambulate - Progress: Progressing toward goal  Visit Information  Last PT Received On: 06/28/12 Assistance Needed: +1    Subjective Data  Subjective: I think I'm walking pretty good Patient Stated Goal: to go to rehab   Cognition  Overall Cognitive Status: Appears within functional limits for tasks assessed/performed Arousal/Alertness: Awake/alert Orientation Level: Appears intact for tasks assessed Behavior During Session: [facility] for tasks performed    Balance     End of Session PT - End of Session Activity Tolerance: Patient tolerated treatment well Patient left: in chair;with call bell/phone within reach;with family/visitor present Nurse Communication: Mobility status   GP     Page, Meribeth Mattes 06/28/2012, 8:51 AM

## 2012-06-28 NOTE — Care Management Note (Signed)
    Page 1 of 2   06/28/2012     12:28:15 PM   CARE MANAGEMENT NOTE 06/28/2012  Patient:  Molly Lucas, Molly Lucas   Account Number:  [id number]  Date Initiated:  06/27/2012  Documentation initiated by:  Colleen Can  Subjective/Objective Assessment:   dx osteoarthritis left hip; total hip replacemnmt-anterior approach     Action/Plan:   SNF rehab   Anticipated DC Date:  06/28/2012   Anticipated DC Plan:  SKILLED NURSING FACILITY  In-house referral  Clinical Social Worker      DC Planning Services  NA      [facility] Choice  NA   Choice offered to / List presented to:  NA   DME arranged  NA      DME agency  NA     HH arranged  NA      HH agency  NA   Status of service:  Completed, signed off Medicare Important Message given?  NO (If response is "NO", the following Medicare IM given date fields will be blank) Date Medicare IM given:   Date Additional Medicare IM given:    Discharge Disposition:  SKILLED NURSING FACILITY  Per UR Regulation:  Reviewed for med. necessity/level of care/duration of stay  If discussed at Long Length of Stay Meetings, dates discussed:    Comments:

## 2012-06-29 ENCOUNTER — Encounter (HOSPITAL_COMMUNITY): Payer: Self-pay | Admitting: Orthopaedic Surgery

## 2012-06-29 DIAGNOSIS — E7800 Pure hypercholesterolemia, unspecified: Secondary | ICD-10-CM | POA: Diagnosis not present

## 2012-06-29 DIAGNOSIS — I10 Essential (primary) hypertension: Secondary | ICD-10-CM | POA: Diagnosis not present

## 2012-06-29 DIAGNOSIS — M1610 Unilateral primary osteoarthritis, unspecified hip: Secondary | ICD-10-CM | POA: Diagnosis not present

## 2012-06-29 DIAGNOSIS — D62 Acute posthemorrhagic anemia: Secondary | ICD-10-CM | POA: Diagnosis not present

## 2012-06-29 NOTE — Progress Notes (Signed)
Clinical Social Work Department CLINICAL SOCIAL WORK PLACEMENT NOTE 06/29/2012  Patient:  Molly Lucas, Molly Lucas  Account Number:  [id number] Admit date:  06/24/2012  Clinical Social Worker:  Leron Croak, CLINICAL SOCIAL WORKER  Date/time:  06/26/2012 09:09 AM  Clinical Social Work is seeking post-discharge placement for this patient at the following level of care:   SKILLED NURSING   (*CSW will update this form in Epic as items are completed)   06/25/2012  Patient/family provided with [facility] Department of Clinical Social Work's list of facilities offering this level of care within the geographic area requested by the patient (or if unable, by the patient's family).  06/25/2012  Patient/family informed of their freedom to choose among providers that offer the needed level of care, that participate in Medicare, Medicaid or managed care program needed by the patient, have an available bed and are willing to accept the patient.  06/25/2012  Patient/family informed of [facility]' ownership interest in [facility], as well as of the fact that they are under no obligation to receive care at this facility.  PASARR submitted to EDS on 06/26/2012 PASARR number received from EDS on 06/26/2012  FL2 transmitted to all facilities in geographic area requested by pt/family on  06/26/2012 FL2 transmitted to all facilities within larger geographic area on 06/26/2012  Patient informed that his/her managed care company has contracts with or will negotiate with  certain facilities, including the following:     Patient/family informed of bed offers received:  06/26/2012 Patient chooses bed at [facility] Physician recommends and patient chooses bed at    Patient to be transferred to [facility] on  06/28/2012 Patient to be transferred to facility by [facility]  The following physician request were entered in Epic:   Additional Comments:  Cori Razor LCSW [phone]

## 2012-07-02 ENCOUNTER — Inpatient Hospital Stay (HOSPITAL_COMMUNITY)
Admission: EM | Admit: 2012-07-02 | Discharge: 2012-07-05 | DRG: 556 | Disposition: A | Discharge status: Skilled Nursing Facility | Payer: Medicare Other | Attending: Orthopaedic Surgery | Admitting: Orthopaedic Surgery

## 2012-07-02 ENCOUNTER — Encounter (HOSPITAL_COMMUNITY): Payer: Self-pay | Admitting: *Deleted

## 2012-07-02 DIAGNOSIS — Z96649 Presence of unspecified artificial hip joint: Secondary | ICD-10-CM | POA: Diagnosis not present

## 2012-07-02 DIAGNOSIS — M7989 Other specified soft tissue disorders: Principal | ICD-10-CM | POA: Diagnosis present

## 2012-07-02 DIAGNOSIS — M25559 Pain in unspecified hip: Secondary | ICD-10-CM | POA: Diagnosis present

## 2012-07-02 DIAGNOSIS — L02419 Cutaneous abscess of limb, unspecified: Secondary | ICD-10-CM | POA: Diagnosis not present

## 2012-07-02 DIAGNOSIS — Z79899 Other long term (current) drug therapy: Secondary | ICD-10-CM

## 2012-07-02 DIAGNOSIS — I10 Essential (primary) hypertension: Secondary | ICD-10-CM | POA: Diagnosis present

## 2012-07-02 DIAGNOSIS — F329 Major depressive disorder, single episode, unspecified: Secondary | ICD-10-CM | POA: Diagnosis present

## 2012-07-02 DIAGNOSIS — F411 Generalized anxiety disorder: Secondary | ICD-10-CM | POA: Diagnosis present

## 2012-07-02 DIAGNOSIS — Z7982 Long term (current) use of aspirin: Secondary | ICD-10-CM

## 2012-07-02 DIAGNOSIS — F3289 Other specified depressive episodes: Secondary | ICD-10-CM | POA: Diagnosis present

## 2012-07-02 DIAGNOSIS — G4730 Sleep apnea, unspecified: Secondary | ICD-10-CM | POA: Diagnosis present

## 2012-07-02 DIAGNOSIS — M25459 Effusion, unspecified hip: Secondary | ICD-10-CM | POA: Diagnosis not present

## 2012-07-02 DIAGNOSIS — L03116 Cellulitis of left lower limb: Secondary | ICD-10-CM

## 2012-07-02 DIAGNOSIS — L03119 Cellulitis of unspecified part of limb: Secondary | ICD-10-CM | POA: Diagnosis not present

## 2012-07-02 DIAGNOSIS — R7881 Bacteremia: Secondary | ICD-10-CM | POA: Diagnosis present

## 2012-07-02 NOTE — ED Notes (Signed)
Increased swelling left leg,  Sent in for evaluation,  Pt is post op total hip replacement, she has had increased swelling,  Pt is from [facility] and rehab

## 2012-07-02 NOTE — ED Notes (Signed)
Ice pack on the left hip

## 2012-07-02 NOTE — ED Notes (Signed)
Bed:[facility]<BR> Expected date:<BR> Expected time:<BR> Means of arrival:<BR> Comments:<BR> Hall A, Medic 100, 65 F, Wound Infection, recent Hip Sx

## 2012-07-03 ENCOUNTER — Emergency Department (HOSPITAL_COMMUNITY): Payer: Medicare Other

## 2012-07-03 DIAGNOSIS — F3289 Other specified depressive episodes: Secondary | ICD-10-CM | POA: Diagnosis present

## 2012-07-03 DIAGNOSIS — F329 Major depressive disorder, single episode, unspecified: Secondary | ICD-10-CM | POA: Diagnosis present

## 2012-07-03 DIAGNOSIS — L02419 Cutaneous abscess of limb, unspecified: Secondary | ICD-10-CM | POA: Diagnosis not present

## 2012-07-03 DIAGNOSIS — M25459 Effusion, unspecified hip: Secondary | ICD-10-CM | POA: Diagnosis not present

## 2012-07-03 DIAGNOSIS — M79609 Pain in unspecified limb: Secondary | ICD-10-CM | POA: Diagnosis not present

## 2012-07-03 DIAGNOSIS — Z471 Aftercare following joint replacement surgery: Secondary | ICD-10-CM | POA: Diagnosis not present

## 2012-07-03 DIAGNOSIS — M1990 Unspecified osteoarthritis, unspecified site: Secondary | ICD-10-CM | POA: Diagnosis not present

## 2012-07-03 DIAGNOSIS — M25559 Pain in unspecified hip: Secondary | ICD-10-CM | POA: Diagnosis not present

## 2012-07-03 DIAGNOSIS — S79919A Unspecified injury of unspecified hip, initial encounter: Secondary | ICD-10-CM | POA: Diagnosis not present

## 2012-07-03 DIAGNOSIS — M7989 Other specified soft tissue disorders: Secondary | ICD-10-CM

## 2012-07-03 DIAGNOSIS — L03119 Cellulitis of unspecified part of limb: Secondary | ICD-10-CM | POA: Diagnosis not present

## 2012-07-03 DIAGNOSIS — E785 Hyperlipidemia, unspecified: Secondary | ICD-10-CM | POA: Diagnosis not present

## 2012-07-03 DIAGNOSIS — R7881 Bacteremia: Secondary | ICD-10-CM | POA: Diagnosis present

## 2012-07-03 DIAGNOSIS — I10 Essential (primary) hypertension: Secondary | ICD-10-CM | POA: Diagnosis present

## 2012-07-03 DIAGNOSIS — D649 Anemia, unspecified: Secondary | ICD-10-CM | POA: Diagnosis not present

## 2012-07-03 DIAGNOSIS — Z96649 Presence of unspecified artificial hip joint: Secondary | ICD-10-CM | POA: Diagnosis not present

## 2012-07-03 DIAGNOSIS — F411 Generalized anxiety disorder: Secondary | ICD-10-CM | POA: Diagnosis present

## 2012-07-03 DIAGNOSIS — Z79899 Other long term (current) drug therapy: Secondary | ICD-10-CM | POA: Diagnosis not present

## 2012-07-03 DIAGNOSIS — Z7982 Long term (current) use of aspirin: Secondary | ICD-10-CM | POA: Diagnosis not present

## 2012-07-03 DIAGNOSIS — G4730 Sleep apnea, unspecified: Secondary | ICD-10-CM | POA: Diagnosis present

## 2012-07-03 DIAGNOSIS — Z5189 Encounter for other specified aftercare: Secondary | ICD-10-CM | POA: Diagnosis not present

## 2012-07-03 LAB — CBC WITH DIFFERENTIAL/PLATELET
Basophils Absolute: 0 10*3/uL (ref 0.0–0.1)
Basophils Absolute: 0 10*3/uL (ref 0.0–0.1)
Basophils Relative: 0 % (ref 0–1)
Eosinophils Absolute: 0.1 10*3/uL (ref 0.0–0.7)
HCT: 51.7 % — ABNORMAL HIGH (ref 36.0–46.0)
Lymphocytes Relative: 15 % (ref 12–46)
Lymphs Abs: 0.4 10*3/uL — ABNORMAL LOW (ref 0.7–4.0)
MCHC: 34.6 g/dL (ref 30.0–36.0)
MCHC: 33.7 g/dL (ref 30.0–36.0)
Monocytes Absolute: 0.8 10*3/uL (ref 0.1–1.0)
Neutro Abs: 1.8 10*3/uL (ref 1.7–7.7)
Neutro Abs: 3.8 10*3/uL (ref 1.7–7.7)
Neutrophils Relative %: 61 % (ref 43–77)
RDW: 14.6 % (ref 11.5–15.5)
RDW: 14.7 % (ref 11.5–15.5)

## 2012-07-03 LAB — BASIC METABOLIC PANEL
BUN: 10 mg/dL (ref 6–23)
Calcium: 8.9 mg/dL (ref 8.4–10.5)
GFR calc non Af Amer: 89 mL/min — ABNORMAL LOW (ref >90)
Glucose, Bld: 102 mg/dL — ABNORMAL HIGH (ref 70–99)

## 2012-07-03 LAB — PROTIME-INR: Prothrombin Time: 13.6 seconds (ref 11.6–15.2)

## 2012-07-03 LAB — C-REACTIVE PROTEIN: CRP: 3.8 mg/dL — ABNORMAL HIGH (ref <0.60)

## 2012-07-03 MED ORDER — LOSARTAN POTASSIUM 50 MG PO TABS
100.0000 mg | ORAL_TABLET | Freq: Every morning | ORAL | Status: DC
  Filled 2012-07-03 (×3): qty 2

## 2012-07-03 MED ORDER — ENOXAPARIN SODIUM 40 MG/0.4ML ~~LOC~~ SOLN
40.0000 mg | Freq: Every day | SUBCUTANEOUS | Status: DC
  Filled 2012-07-03: qty 0.4

## 2012-07-03 MED ORDER — ENOXAPARIN SODIUM 30 MG/0.3ML ~~LOC~~ SOLN: 30.0000 mg | SUBCUTANEOUS | Status: DC

## 2012-07-03 MED ORDER — HYDROCODONE-ACETAMINOPHEN 5-325 MG PO TABS
2.0000 | ORAL_TABLET | ORAL | Status: DC | PRN
  Administered 2012-07-04: 2 via ORAL
  Filled 2012-07-03: qty 2

## 2012-07-03 MED ORDER — WARFARIN VIDEO
Freq: Once | Status: AC
  Administered 2012-07-03: 18:00:00

## 2012-07-03 MED ORDER — VANCOMYCIN HCL 1000 MG IV SOLR
750.0000 mg | Freq: Two times a day (BID) | INTRAVENOUS | Status: DC
  Administered 2012-07-03 – 2012-07-04 (×3): 750 mg via INTRAVENOUS
  Filled 2012-07-03 (×3): qty 750

## 2012-07-03 MED ORDER — BISACODYL 10 MG RE SUPP
10.0000 mg | Freq: Every day | RECTAL | Status: DC | PRN
  Administered 2012-07-03: 10 mg via RECTAL
  Filled 2012-07-03: qty 1

## 2012-07-03 MED ORDER — ENOXAPARIN SODIUM 80 MG/0.8ML ~~LOC~~ SOLN
70.0000 mg | Freq: Once | SUBCUTANEOUS | Status: AC
  Administered 2012-07-03: 80 mg via SUBCUTANEOUS
  Filled 2012-07-03: qty 0.8

## 2012-07-03 MED ORDER — METHOCARBAMOL 500 MG PO TABS
500.0000 mg | ORAL_TABLET | Freq: Four times a day (QID) | ORAL | Status: DC | PRN
  Administered 2012-07-04 – 2012-07-05 (×4): 500 mg via ORAL
  Filled 2012-07-03 (×4): qty 1

## 2012-07-03 MED ORDER — OXYCODONE HCL 5 MG PO TABS
10.0000 mg | ORAL_TABLET | ORAL | Status: DC | PRN
  Administered 2012-07-03 – 2012-07-05 (×10): 10 mg via ORAL
  Filled 2012-07-03 (×11): qty 2

## 2012-07-03 MED ORDER — COUMADIN BOOK
Freq: Once | Status: AC
  Administered 2012-07-03: 12:00:00
  Filled 2012-07-03: qty 1

## 2012-07-03 MED ORDER — CLORAZEPATE DIPOTASSIUM 3.75 MG PO TABS
11.2500 mg | ORAL_TABLET | Freq: Every day | ORAL | Status: DC
  Administered 2012-07-03 – 2012-07-04 (×2): 11.25 mg via ORAL
  Filled 2012-07-03 (×2): qty 3

## 2012-07-03 MED ORDER — ONDANSETRON HCL 4 MG/2ML IJ SOLN: 4.0000 mg | Freq: Four times a day (QID) | INTRAMUSCULAR | Status: DC | PRN

## 2012-07-03 MED ORDER — WARFARIN - PHARMACIST DOSING INPATIENT: Freq: Every day | Status: DC

## 2012-07-03 MED ORDER — DULOXETINE HCL 60 MG PO CPEP
60.0000 mg | ORAL_CAPSULE | Freq: Every day | ORAL | Status: DC
  Administered 2012-07-03 – 2012-07-04 (×2): 60 mg via ORAL
  Filled 2012-07-03 (×3): qty 1

## 2012-07-03 MED ORDER — GABAPENTIN 300 MG PO CAPS
300.0000 mg | ORAL_CAPSULE | Freq: Every day | ORAL | Status: DC
  Administered 2012-07-03 – 2012-07-04 (×2): 300 mg via ORAL
  Filled 2012-07-03 (×3): qty 1

## 2012-07-03 MED ORDER — ENOXAPARIN SODIUM 40 MG/0.4ML ~~LOC~~ SOLN: 40.0000 mg | SUBCUTANEOUS | Status: DC

## 2012-07-03 MED ORDER — VANCOMYCIN HCL IN DEXTROSE 1-5 GM/200ML-% IV SOLN
1000.0000 mg | Freq: Once | INTRAVENOUS | Status: AC
  Administered 2012-07-03: 1000 mg via INTRAVENOUS
  Filled 2012-07-03: qty 200

## 2012-07-03 MED ORDER — VITAMIN D3 25 MCG (1000 UNIT) PO TABS
10000.0000 [IU] | ORAL_TABLET | Freq: Every day | ORAL | Status: DC
  Administered 2012-07-03 – 2012-07-05 (×3): 10000 [IU] via ORAL
  Filled 2012-07-03 (×3): qty 10

## 2012-07-03 MED ORDER — WARFARIN SODIUM 5 MG PO TABS
5.0000 mg | ORAL_TABLET | Freq: Once | ORAL | Status: AC
  Administered 2012-07-03: 5 mg via ORAL
  Filled 2012-07-03: qty 1

## 2012-07-03 MED ORDER — HYDROMORPHONE HCL PF 1 MG/ML IJ SOLN
0.5000 mg | INTRAMUSCULAR | Status: DC | PRN
  Administered 2012-07-03: 0.5 mg via INTRAVENOUS
  Filled 2012-07-03: qty 1

## 2012-07-03 MED ORDER — SIMVASTATIN 10 MG PO TABS
10.0000 mg | ORAL_TABLET | Freq: Every day | ORAL | Status: DC
  Administered 2012-07-03 – 2012-07-05 (×3): 10 mg via ORAL
  Filled 2012-07-03 (×3): qty 1

## 2012-07-03 MED ORDER — VITAMIN D3 125 MCG (5000 UT) PO TABS
10000.0000 mg | ORAL_TABLET | Freq: Every morning | ORAL | Status: DC
Start: 2012-07-03 — End: 2012-07-03

## 2012-07-03 MED ORDER — DOCUSATE SODIUM 100 MG PO CAPS: 100.0000 mg | ORAL_CAPSULE | Freq: Two times a day (BID) | ORAL | Status: DC | PRN

## 2012-07-03 MED ORDER — ONDANSETRON HCL 4 MG PO TABS: 4.0000 mg | ORAL_TABLET | Freq: Four times a day (QID) | ORAL | Status: DC | PRN

## 2012-07-03 NOTE — ED Notes (Signed)
Gave pt ice chips 

## 2012-07-03 NOTE — ED Notes (Signed)
Patient transported to X-ray 

## 2012-07-03 NOTE — Progress Notes (Signed)
VASCULAR LAB PRELIMINARY  PRELIMINARY  PRELIMINARY  PRELIMINARY  Left lower extremity venous Doppler completed.    Preliminary report:  There is no DVT or SVT noted in the left lower extremity.    Laurella Tull, 07/03/2012, 4:18 PM

## 2012-07-03 NOTE — Progress Notes (Signed)
ANTIBIOTIC CONSULT NOTE - INITIAL  Pharmacy Consult for Vancomycin Indication: Possible cellulitis--post THR  Allergies  Allergen Reactions  . Penicillins Other (See Comments)    Tongue swelling     Patient Measurements: Height: 5\' 3"  (160 cm) Weight: 160 lb (72.576 kg) IBW/kg (Calculated) : 52.4  Adjusted Body Weight:   Vital Signs: Temp: 98.3 F (36.8 C) (08/25 0613) Temp src: Oral (08/25 0613) BP: 119/74 mmHg (08/25 1610) Pulse Rate: 76  (08/25 0613) Intake/Output from previous day:   Intake/Output from this shift:    Labs:  Basename 07/03/12 0505 07/03/12 0200  WBC 6.3 2.7*  HGB 9.6* 17.9*  PLT 258 110*  LABCREA -- --  CREATININE -- 0.68   Estimated Creatinine Clearance: 65.2 ml/min (by C-G formula based on Cr of 0.68). No results found for this basename: VANCOTROUGH:2,VANCOPEAK:2,VANCORANDOM:2,GENTTROUGH:2,GENTPEAK:2,GENTRANDOM:2,TOBRATROUGH:2,TOBRAPEAK:2,TOBRARND:2,AMIKACINPEAK:2,AMIKACINTROU:2,AMIKACIN:2, in the last 72 hours   Microbiology: Recent Results (from the past 720 hour(s))  SURGICAL PCR SCREEN     Status: Normal   Collection Time   06/16/12 10:25 AM      Component Value Range Status Comment   MRSA, PCR NEGATIVE  NEGATIVE Final    Staphylococcus aureus NEGATIVE  NEGATIVE Final     Medical History: Past Medical History  Diagnosis Date  . Hypertension   . Anxiety   . Depression   . Sleep apnea     cpap does not use, dentist made mouthipiece   . Arthritis     Medications:  Scheduled:    . enoxaparin (LOVENOX) injection  40 mg Subcutaneous QHS  . enoxaparin  70 mg Subcutaneous Once  . vancomycin  1,000 mg Intravenous Once  . DISCONTD: enoxaparin (LOVENOX) injection  30 mg Subcutaneous Q24H  . DISCONTD: enoxaparin (LOVENOX) injection  30 mg Subcutaneous Q24H   Infusions:   PRN: bisacodyl, docusate sodium, HYDROcodone-acetaminophen, HYDROmorphone (DILAUDID) injection, methocarbamol, ondansetron (ZOFRAN) IV, ondansetron,  oxyCODONE Assessment:  66 yo F with L leg swelling and increased pain (s/p Left THR 8-9 days ago)  Admitted to r/o DVT vs Cellulitis  Goal of Therapy:  Vancomycin trough level 10-15 mcg/ml  Plan:  Vancomycin 750mg  IV q 12 hours Measure antibiotic drug levels at steady state Follow up culture results  Loletta Specter 07/03/2012,8:36 AM

## 2012-07-03 NOTE — ED Notes (Signed)
Ice pack to L hip

## 2012-07-03 NOTE — ED Provider Notes (Signed)
History     CSN: [id number]  Arrival date & time 07/02/12  2300   First MD Initiated Contact with Patient 07/02/12 2347      Chief Complaint  Patient presents with  . Leg Swelling    (Consider location/radiation/quality/duration/timing/severity/associated sxs/prior treatment) HPI..... left leg swelling for 36 hours. Status post left total hip replacement on 06/24/2012 by Dr. Magnus Ivan.  Patient has been in [facility] for rehabilitation.  Surgery was complicated by excessive blood loss requiring 3 blood transfusions .  Patient now complains of redness and swelling in left lower extremity. No precise hip tenderness. Low-grade fever. Movement makes leg worse. Severity is moderate.  Past Medical History  Diagnosis Date  . Hypertension   . Anxiety   . Depression   . Sleep apnea     cpap does not use, dentist made mouthipiece   . Arthritis     Past Surgical History  Procedure Date  . Wisdom tooth extraction   . Uterine fibroid surgery   . Total hip arthroplasty 06/24/2012    Procedure: TOTAL HIP ARTHROPLASTY ANTERIOR APPROACH;  Surgeon: Kathryne Hitch, MD;  Location: [facility];  Service: Orthopedics;  Laterality: Left;  Left total hip arthroplasty, anterior approach    History reviewed. No pertinent family history.  History  Substance Use Topics  . Smoking status: Never Smoker   . Smokeless tobacco: Never Used  . Alcohol Use: Yes     occasional glass of wine     OB History    Grav Para Term Preterm Abortions TAB SAB Ect Mult Living                  Review of Systems  All other systems reviewed and are negative.    Allergies  Penicillins  Home Medications   Current Outpatient Rx  Name Route Sig Dispense Refill  . ASPIRIN 325 MG PO TBEC Oral Take 1 tablet (325 mg total) by mouth 2 (two) times daily. 30 tablet 0  . VITAMIN D3 5000 UNITS PO TABS Oral Take 10,000 mg by mouth every morning.    Marland Kitchen CINNAMON PO Oral Take 4,000 mg by mouth every morning.    Marland Kitchen  CLORAZEPATE DIPOTASSIUM 7.5 MG PO TABS Oral Take 11.25 mg by mouth at bedtime. Pt takes 1 and 1/2 tablet for a total dose of 11.25mg     . DULOXETINE HCL 60 MG PO CPEP Oral Take 60 mg by mouth at bedtime.    Marland Kitchen FERROUS SULFATE 325 (65 FE) MG PO TABS Oral Take 1 tablet (325 mg total) by mouth 3 (three) times daily after meals. 20 tablet 0  . GABAPENTIN 300 MG PO CAPS Oral Take 300 mg by mouth 2 (two) times daily.    . DEPLIN 15 PO Oral Take 15 mg by mouth at bedtime.    Marland Kitchen LOSARTAN POTASSIUM 100 MG PO TABS Oral Take 100 mg by mouth every morning.    . SUPER OMEGA 3 EPA/DHA PO Oral Take 3 capsules by mouth every morning.    . OXYCODONE-ACETAMINOPHEN 5-325 MG PO TABS Oral Take 1-2 tablets by mouth every 4 (four) hours as needed for pain. 60 tablet 0  . PRAVASTATIN SODIUM 20 MG PO TABS Oral Take 20 mg by mouth daily.    Marland Kitchen PRESCRIPTION MEDICATION Oral Take 2 tablets by mouth every morning. Pt takes a dietary supplement. Called hairgrow.  ( vitamin E,Folic acid, folate. Biotin)    . NABUMETONE 500 MG PO TABS Oral Take 500 mg by  mouth 2 (two) times daily.       BP 131/64  Pulse 97  Temp 99.4 F (37.4 C) (Oral)  Resp 20  SpO2 97%  Physical Exam  Nursing note and vitals reviewed. Constitutional: She is oriented to person, place, and time. She appears well-developed and well-nourished.  HENT:  Head: Normocephalic and atraumatic.  Eyes: Conjunctivae and EOM are normal. Pupils are equal, round, and reactive to light.  Neck: Normal range of motion. Neck supple.  Cardiovascular: Normal rate and regular rhythm.   Pulmonary/Chest: Effort normal and breath sounds normal.  Abdominal: Soft. Bowel sounds are normal.  Musculoskeletal:       Left lower extremity: Leg is edematous from hip to foot. Tenderness and posterior thigh and calf. Erythema throughout limb  Neurological: She is alert and oriented to person, place, and time.  Skin: Skin is warm and dry.  Psychiatric: She has a normal mood and affect.      ED Course  Procedures (including critical care time)  Labs Reviewed  CBC WITH DIFFERENTIAL - Abnormal; Notable for the following:    WBC 2.7 (*)     RBC 5.47 (*)     Hemoglobin 17.9 (*)     HCT 51.7 (*)     Platelets 110 (*)     Monocytes Relative 13 (*)     Lymphs Abs 0.4 (*)     All other components within normal limits  BASIC METABOLIC PANEL - Abnormal; Notable for the following:    Glucose, Bld 102 (*)     GFR calc non Af Amer 89 (*)     All other components within normal limits  CULTURE, BLOOD (ROUTINE X 2)  CULTURE, BLOOD (ROUTINE X 2)  URINALYSIS, ROUTINE W REFLEX MICROSCOPIC  CBC WITH DIFFERENTIAL  SEDIMENTATION RATE  C-REACTIVE PROTEIN   Dg Hip Complete Left  07/03/2012  *RADIOLOGY REPORT*  Clinical Data: Total hip replacement 1 week ago.  Swelling, redness.  LEFT HIP - COMPLETE 2+ VIEW  Comparison: 06/24/2012  Findings: Left total hip arthroplasty.  Hardware appears to be in appropriate positioning without evidence of hardware complication. No periprosthetic lucency.  Skin staples project along the lateral aspect of the left thigh. Otherwise, no radiopaque foreign body or appreciable gas collection.  IMPRESSION: Status post left total hip arthroplasty without acute osseous finding.   Original Report Authenticated By: Waneta Martins, M.D.      1. Cellulitis of left lower extremity    CRITICAL CARE Performed by: Donnetta Hutching   Total critical care time: 30 Critical care time was exclusive of separately billable procedures and treating other patients.  Critical care was necessary to treat or prevent imminent or life-threatening deterioration.  Critical care was time spent personally by me on the following activities: development of treatment plan with patient and/or surrogate as well as nursing, discussions with consultants, evaluation of patient's response to treatment, examination of patient, obtaining history from patient or surrogate, ordering and performing  treatments and interventions, ordering and review of laboratory studies, ordering and review of radiographic studies, pulse oximetry and re-evaluation of patient's condition.   MDM   Plain films of hip are negative.  However I am concerned that pt has both a dvt in LLE and cellulitis.  rx sub q lovenox and iv vanc.  Disc c Dr Magnus Ivan.  admit       Donnetta Hutching, MD 07/03/12 [phone]

## 2012-07-03 NOTE — ED Notes (Signed)
Patient returned from X-ray 

## 2012-07-03 NOTE — Progress Notes (Signed)
Patient ID: Molly Lucas, female   DOB: 1946-06-18, 66 y.o.   MRN: [id number] Doppler ultrasound negative for DVT.  Will stop lovenox, but continue coumadin.  Will re-evaluate in the am with likely return to [facility] tomorrow if continues to look good.  Will check CBC in am.

## 2012-07-03 NOTE — Progress Notes (Addendum)
ANTICOAGULATION CONSULT NOTE - Initial Consult  Pharmacy Consult for Warfarin Indication: VTE treatment  Allergies  Allergen Reactions  . Penicillins Other (See Comments)    Tongue swelling     Patient Measurements: Height: 5\' 3"  (160 cm) Weight: 160 lb (72.576 kg) IBW/kg (Calculated) : 52.4  Heparin Dosing Weight:   Vital Signs: Temp: 98.3 F (36.8 C) (08/25 0613) Temp src: Oral (08/25 0613) BP: 119/74 mmHg (08/25 0613) Pulse Rate: 76  (08/25 0613)  Labs:  Alvira Philips 07/03/12 0946 07/03/12 0505 07/03/12 0200  HGB -- 9.6* 17.9*  HCT -- 28.5* 51.7*  PLT -- 258 110*  APTT -- -- --  LABPROT 13.6 -- --  INR 1.02 -- --  HEPARINUNFRC -- -- --  CREATININE -- -- 0.68  CKTOTAL -- -- --  CKMB -- -- --  TROPONINI -- -- --    Estimated Creatinine Clearance: 65.2 ml/min (by C-G formula based on Cr of 0.68).   Medical History: Past Medical History  Diagnosis Date  . Hypertension   . Anxiety   . Depression   . Sleep apnea     cpap does not use, dentist made mouthipiece   . Arthritis     Medications:  Scheduled:    . enoxaparin (LOVENOX) injection  40 mg Subcutaneous QHS  . enoxaparin  70 mg Subcutaneous Once  . vancomycin  750 mg Intravenous Q12H  . vancomycin  1,000 mg Intravenous Once  . DISCONTD: enoxaparin (LOVENOX) injection  30 mg Subcutaneous Q24H  . DISCONTD: enoxaparin (LOVENOX) injection  30 mg Subcutaneous Q24H   Infusions:   PRN: bisacodyl, docusate sodium, HYDROcodone-acetaminophen, HYDROmorphone (DILAUDID) injection, methocarbamol, ondansetron (ZOFRAN) IV, ondansetron, oxyCODONE  Assessment:  66 yo Female with L leg swelling and increased pain (S/P Left THR 8-9 days ago)   Admitted to r/o DVT vs Cellulitis  Goal of Therapy:  INR 2-3 Monitor platelets by anticoagulation protocol: Yes   Plan:  Begin Coumadin 5mg  today at 8p Daily PT/INR Coumadin teaching book/ video  Loletta Specter 07/03/2012,10:28 AM

## 2012-07-03 NOTE — H&P (Signed)
Molly Lucas is an 66 y.o. female.   Chief Complaint:   Left leg swelling and increased pain post left total hip replacement HPI:   66 yo female well known to me.  8-9 days out from a total hip replacement.  While at the [facility], has developed increased left leg pain and swelling.  Is being admitted to R/O DVT vs cellulitis.  Past Medical History  Diagnosis Date  . Hypertension   . Anxiety   . Depression   . Sleep apnea     cpap does not use, dentist made mouthipiece   . Arthritis     Past Surgical History  Procedure Date  . Wisdom tooth extraction   . Uterine fibroid surgery   . Total hip arthroplasty 06/24/2012    Procedure: TOTAL HIP ARTHROPLASTY ANTERIOR APPROACH;  Surgeon: Kathryne Hitch, MD;  Location: [facility];  Service: Orthopedics;  Laterality: Left;  Left total hip arthroplasty, anterior approach    History reviewed. No pertinent family history. Social History:  reports that she has never smoked. She has never used smokeless tobacco. She reports that she drinks alcohol. She reports that she does not use illicit drugs.  Allergies:  Allergies  Allergen Reactions  . Penicillins Other (See Comments)    Tongue swelling     Medications Prior to Admission  Medication Sig Dispense Refill  . aspirin EC 325 MG EC tablet Take 1 tablet (325 mg total) by mouth 2 (two) times daily.  30 tablet  0  . Cholecalciferol (VITAMIN D3) 5000 UNITS TABS Take 10,000 mg by mouth every morning.      Marland Kitchen CINNAMON PO Take 4,000 mg by mouth every morning.      . clorazepate (TRANXENE) 7.5 MG tablet Take 11.25 mg by mouth at bedtime. Pt takes 1 and 1/2 tablet for a total dose of 11.25mg       . DULoxetine (CYMBALTA) 60 MG capsule Take 60 mg by mouth at bedtime.      . ferrous sulfate 325 (65 FE) MG tablet Take 1 tablet (325 mg total) by mouth 3 (three) times daily after meals.  20 tablet  0  . gabapentin (NEURONTIN) 300 MG capsule Take 300 mg by mouth 2 (two) times daily.      Marland Kitchen  L-Methylfolate-Algae (DEPLIN 15 PO) Take 15 mg by mouth at bedtime.      Marland Kitchen losartan (COZAAR) 100 MG tablet Take 100 mg by mouth every morning.      . Omega-3 Fatty Acids (SUPER OMEGA 3 EPA/DHA PO) Take 3 capsules by mouth every morning.      Marland Kitchen oxyCODONE-acetaminophen (ROXICET) 5-325 MG per tablet Take 1-2 tablets by mouth every 4 (four) hours as needed for pain.  60 tablet  0  . pravastatin (PRAVACHOL) 20 MG tablet Take 20 mg by mouth daily.      Marland Kitchen PRESCRIPTION MEDICATION Take 2 tablets by mouth every morning. Pt takes a dietary supplement. Called hairgrow.  ( vitamin E,Folic acid, folate. Biotin)      . nabumetone (RELAFEN) 500 MG tablet Take 500 mg by mouth 2 (two) times daily.         Results for orders placed during the hospital encounter of 07/02/12 (from the past 48 hour(s))  CBC WITH DIFFERENTIAL     Status: Abnormal   Collection Time   07/03/12  2:00 AM      Component Value Range Comment   WBC 2.7 (*) 4.0 - 10.5 K/uL    RBC 5.47 (*)  3.87 - 5.11 MIL/uL    Hemoglobin 17.9 (*) 12.0 - 15.0 g/dL    HCT 16.1 (*) 09.6 - 46.0 %    MCV 94.5  78.0 - 100.0 fL    MCH 32.7  26.0 - 34.0 pg    MCHC 34.6  30.0 - 36.0 g/dL    RDW 04.5  40.9 - 81.1 %    Platelets 110 (*) 150 - 400 K/uL    Neutrophils Relative 67  43 - 77 %    Lymphocytes Relative 15  12 - 46 %    Monocytes Relative 13 (*) 3 - 12 %    Eosinophils Relative 5  0 - 5 %    Basophils Relative 0  0 - 1 %    Neutro Abs 1.8  1.7 - 7.7 K/uL    Lymphs Abs 0.4 (*) 0.7 - 4.0 K/uL    Monocytes Absolute 0.4  0.1 - 1.0 K/uL    Eosinophils Absolute 0.1  0.0 - 0.7 K/uL    Basophils Absolute 0.0  0.0 - 0.1 K/uL    RBC Morphology POLYCHROMASIA PRESENT      WBC Morphology INCREASED BANDS (>20% BANDS)     BASIC METABOLIC PANEL     Status: Abnormal   Collection Time   07/03/12  2:00 AM      Component Value Range Comment   Sodium 137  135 - 145 mEq/L    Potassium 3.6  3.5 - 5.1 mEq/L    Chloride 101  96 - 112 mEq/L    CO2 28  19 - 32 mEq/L     Glucose, Bld 102 (*) 70 - 99 mg/dL    BUN 10  6 - 23 mg/dL    Creatinine, Ser 9.14  0.50 - 1.10 mg/dL    Calcium 8.9  8.4 - 78.2 mg/dL    GFR calc non Af Amer 89 (*) >90 mL/min    GFR calc Af Amer >90  >90 mL/min   CBC WITH DIFFERENTIAL     Status: Abnormal   Collection Time   07/03/12  5:05 AM      Component Value Range Comment   WBC 6.3  4.0 - 10.5 K/uL    RBC 2.99 (*) 3.87 - 5.11 MIL/uL    Hemoglobin 9.6 (*) 12.0 - 15.0 g/dL    HCT 95.6 (*) 21.3 - 46.0 %    MCV 95.3  78.0 - 100.0 fL    MCH 32.1  26.0 - 34.0 pg    MCHC 33.7  30.0 - 36.0 g/dL    RDW 08.6  57.8 - 46.9 %    Platelets 258  150 - 400 K/uL    Neutrophils Relative 61  43 - 77 %    Neutro Abs 3.8  1.7 - 7.7 K/uL    Lymphocytes Relative 23  12 - 46 %    Lymphs Abs 1.4  0.7 - 4.0 K/uL    Monocytes Relative 13 (*) 3 - 12 %    Monocytes Absolute 0.8  0.1 - 1.0 K/uL    Eosinophils Relative 3  0 - 5 %    Eosinophils Absolute 0.2  0.0 - 0.7 K/uL    Basophils Relative 0  0 - 1 %    Basophils Absolute 0.0  0.0 - 0.1 K/uL   SEDIMENTATION RATE     Status: Abnormal   Collection Time   07/03/12  5:05 AM      Component Value Range Comment   Sed  Rate 31 (*) 0 - 22 mm/hr    Dg Hip Complete Left  07/03/2012  *RADIOLOGY REPORT*  Clinical Data: Total hip replacement 1 week ago.  Swelling, redness.  LEFT HIP - COMPLETE 2+ VIEW  Comparison: 06/24/2012  Findings: Left total hip arthroplasty.  Hardware appears to be in appropriate positioning without evidence of hardware complication. No periprosthetic lucency.  Skin staples project along the lateral aspect of the left thigh. Otherwise, no radiopaque foreign body or appreciable gas collection.  IMPRESSION: Status post left total hip arthroplasty without acute osseous finding.   Original Report Authenticated By: Waneta Martins, M.D.     Review of Systems  All other systems reviewed and are negative.    Blood pressure 119/74, pulse 76, temperature 98.3 F (36.8 C), temperature source  Oral, resp. rate 20, height 5\' 3"  (1.6 m), weight 72.576 kg (160 lb), SpO2 96.00%. Physical Exam  Constitutional: She is oriented to person, place, and time. She appears well-developed and well-nourished.  HENT:  Head: Normocephalic and atraumatic.  Eyes: Conjunctivae are normal. Pupils are equal, round, and reactive to light.  Neck: Normal range of motion. Neck supple.  Cardiovascular: Normal rate and regular rhythm.   Respiratory: Effort normal and breath sounds normal.  GI: Soft. Bowel sounds are normal.  Musculoskeletal:       Left upper leg: She exhibits tenderness, swelling and edema.       Left lower leg: She exhibits tenderness.       Legs: Neurological: She is alert and oriented to person, place, and time.  Skin: Skin is warm and dry.  Psychiatric: She has a normal mood and affect.     Assessment/Plan Left leg swelling, some warmth and minimal redness, but pain 9 days post left total hip replacement 1) Given her normal WBC and barely elevated CRP as well as minimal redness, I don't think this is infection/cellulitis.  More likely, this could be a post-op DVT.  I will continue her Lovenox and start Coumadin.  We will also order venous dopplers of the left leg to assess for DVT.  Tripton Ned Y 07/03/2012, 8:11 AM

## 2012-07-04 LAB — CBC
Hemoglobin: 9.1 g/dL — ABNORMAL LOW (ref 12.0–15.0)
MCH: 31.8 pg (ref 26.0–34.0)
MCHC: 33.5 g/dL (ref 30.0–36.0)

## 2012-07-04 LAB — PROTIME-INR: INR: 1.12 (ref 0.00–1.49)

## 2012-07-04 LAB — BASIC METABOLIC PANEL
BUN: 7 mg/dL (ref 6–23)
Calcium: 8.8 mg/dL (ref 8.4–10.5)
GFR calc non Af Amer: >90 mL/min (ref >90)
Glucose, Bld: 91 mg/dL (ref 70–99)
Sodium: 139 mEq/L (ref 135–145)

## 2012-07-04 MED ORDER — VANCOMYCIN HCL 1000 MG IV SOLR
750.0000 mg | Freq: Two times a day (BID) | INTRAVENOUS | Status: DC
  Administered 2012-07-04 – 2012-07-05 (×2): 750 mg via INTRAVENOUS
  Filled 2012-07-04 (×3): qty 750

## 2012-07-04 MED ORDER — WARFARIN SODIUM 1 MG PO TABS: 1.0000 mg | ORAL_TABLET | ORAL | Status: DC

## 2012-07-04 MED ORDER — WARFARIN SODIUM 5 MG PO TABS
5.0000 mg | ORAL_TABLET | Freq: Once | ORAL | Status: AC
  Administered 2012-07-04: 5 mg via ORAL
  Filled 2012-07-04: qty 1

## 2012-07-04 MED ORDER — SODIUM CHLORIDE 0.9 % IV SOLN: INTRAVENOUS | Status: DC

## 2012-07-04 MED ORDER — DOXYCYCLINE HYCLATE 50 MG PO CAPS: 100.0000 mg | ORAL_CAPSULE | Freq: Two times a day (BID) | ORAL | Status: AC

## 2012-07-04 MED ORDER — OXYCODONE-ACETAMINOPHEN 5-325 MG PO TABS: 1.0000 | ORAL_TABLET | ORAL | Status: AC | PRN

## 2012-07-04 NOTE — Discharge Summary (Signed)
Patient ID: Molly Lucas MRN: [id number] DOB/AGE: 68-Aug-1947 68 y.o.  Admit date: 07/02/2012 Discharge date: 07/04/2012  Admission Diagnoses:  Active Problems:  * No active hospital problems. *    Discharge Diagnoses:  Same  Past Medical History  Diagnosis Date  . Hypertension   . Anxiety   . Depression   . Sleep apnea     cpap does not use, dentist made mouthipiece   . Arthritis     Surgeries:  on * No surgery found *   Consultants:    Discharged Condition: Improved  Hospital Course: Molly Lucas is an 66 y.o. female who was admitted 07/02/2012 for operative treatment of<principal problem not specified>. Patient has severe unremitting pain that affects sleep, daily activities, and work/hobbies. After pre-op clearance the patient was taken to the operating room on * No surgery found * and underwent  .    Patient was given perioperative antibiotics: Anti-infectives     Start     Dose/Rate Route Frequency Ordered Stop   07/04/12 0000   doxycycline (VIBRAMYCIN) 50 MG capsule        100 mg Oral 2 times daily 07/04/12 0644 07/14/12 2359   2012/07/29 1000   vancomycin (VANCOCIN) 750 mg in sodium chloride 0.9 % 150 mL IVPB        750 mg 150 mL/hr over 60 Minutes Intravenous Every 12 hours 2012-07-29 0845     2012-07-29 0115   vancomycin (VANCOCIN) IVPB 1000 mg/200 mL premix        1,000 mg 200 mL/hr over 60 Minutes Intravenous  Once 07-29-2012 0053 07/29/2012 0334           Patient was given sequential compression devices, early ambulation, and chemoprophylaxis to prevent DVT.  There was no DVT found on Doppler Ultrasound and she showed no evidence of infection, just significant post-op swelling.  Patient benefited maximally from hospital stay and there were no complications.    Recent vital signs: Patient Vitals for the past 24 hrs:  BP Temp Temp src Pulse Resp SpO2  07/04/12 0410 99/63 mmHg 98.8 F (37.1 C) Oral 84  20  92 %  07-29-12 2140 95/61 mmHg 99.5 F (37.5 C)  Oral 88  20  95 %  2012/07/29 1341 136/78 mmHg 98.9 F (37.2 C) Oral 86  20  99 %     Recent laboratory studies:  Basename 07/04/12 0400 2012/07/29 0946 07/29/2012 0505 07-29-12 0200  WBC 6.1 -- 6.3 --  HGB 9.1* -- 9.6* --  HCT 27.2* -- 28.5* --  PLT 277 -- 258 --  NA 139 -- -- 137  K 3.7 -- -- 3.6  CL 107 -- -- 101  CO2 26 -- -- 28  BUN 7 -- -- 10  CREATININE 0.64 -- -- 0.68  GLUCOSE 91 -- -- 102*  INR 1.12 1.02 -- --  CALCIUM 8.8 -- -- --     Discharge Medications:   Medication List  As of 07/04/2012  6:45 AM   STOP taking these medications         nabumetone 500 MG tablet         TAKE these medications         aspirin 325 MG EC tablet   Take 1 tablet (325 mg total) by mouth 2 (two) times daily.      CINNAMON PO   Take 4,000 mg by mouth every morning.      clorazepate 7.5 MG tablet   Commonly known as:  TRANXENE   Take 11.25 mg by mouth at bedtime. Pt takes 1 and 1/2 tablet for a total dose of 11.25mg       DEPLIN 15 PO   Take 15 mg by mouth at bedtime.      doxycycline 50 MG capsule   Commonly known as: VIBRAMYCIN   Take 2 capsules (100 mg total) by mouth 2 (two) times daily.      DULoxetine 60 MG capsule   Commonly known as: CYMBALTA   Take 60 mg by mouth at bedtime.      ferrous sulfate 325 (65 FE) MG tablet   Take 1 tablet (325 mg total) by mouth 3 (three) times daily after meals.      gabapentin 300 MG capsule   Commonly known as: NEURONTIN   Take 300 mg by mouth 2 (two) times daily.      losartan 100 MG tablet   Commonly known as: COZAAR   Take 100 mg by mouth every morning.      oxyCODONE-acetaminophen 5-325 MG per tablet   Commonly known as: PERCOCET/ROXICET   Take 1-2 tablets by mouth every 4 (four) hours as needed for pain.      oxyCODONE-acetaminophen 5-325 MG per tablet   Commonly known as: PERCOCET/ROXICET   Take 1-2 tablets by mouth every 4 (four) hours as needed for pain.      pravastatin 20 MG tablet   Commonly known as: PRAVACHOL    Take 20 mg by mouth daily.      PRESCRIPTION MEDICATION   Take 2 tablets by mouth every morning. Pt takes a dietary supplement. Called hairgrow.  ( vitamin E,Folic acid, folate. Biotin)      SUPER OMEGA 3 EPA/DHA PO   Take 3 capsules by mouth every morning.      Vitamin D3 5000 UNITS Tabs   Take 10,000 mg by mouth every morning.      warfarin 1 MG tablet   Commonly known as: COUMADIN   Take 1 tablet (1 mg total) by mouth as directed.            Diagnostic Studies: Dg Chest 2 View  06/16/2012  *RADIOLOGY REPORT*  Clinical Data: Preoperative evaluation.  History of hypertension.  CHEST - 2 VIEW  Comparison: None.  Findings: Heart and mediastinal contours are within normal limits. The lung fields appear clear with no signs of focal infiltrate or congestive failure.  No pleural fluid or significant peribronchial cuffing is seen.  Bony structures are notable for thoracolumbar rotoscoliosis with some associated degenerative change of the upper lumbar spine and are otherwise appear intact.  IMPRESSION: No worrisome focal or acute cardiopulmonary abnormality seen  Original Report Authenticated By: Bertha Stakes, M.D.   Dg Hip Complete Left  07/03/2012  *RADIOLOGY REPORT*  Clinical Data: Total hip replacement 1 week ago.  Swelling, redness.  LEFT HIP - COMPLETE 2+ VIEW  Comparison: 06/24/2012  Findings: Left total hip arthroplasty.  Hardware appears to be in appropriate positioning without evidence of hardware complication. No periprosthetic lucency.  Skin staples project along the lateral aspect of the left thigh. Otherwise, no radiopaque foreign body or appreciable gas collection.  IMPRESSION: Status post left total hip arthroplasty without acute osseous finding.   Original Report Authenticated By: Waneta Martins, M.D.    Dg Hip Complete Left  06/24/2012  *RADIOLOGY REPORT*  Clinical Data: Left anterior approach total hip replacement  LEFT HIP - COMPLETE 2+ VIEW  Comparison: None.   Findings: Two C-arm  spot films show the femoral and acetabular components in good position.  No acute bony abnormality is seen.  IMPRESSION: Left total hip replacement in good position on C-arm views obtained.  Original Report Authenticated By: Juline Patch, M.D.   Dg Pelvis Portable  06/24/2012  *RADIOLOGY REPORT*  Clinical Data: Left hip replacement  PORTABLE PELVIS  Comparison: Spot fluoro films from earlier the same day  Findings: Single portable view of the lower pelvis and hips shows the patient to be immediately status post total hip replacement. No evidence for immediate hardware complications.  Gas within the adjacent soft tissues is compatible with immediate postoperative state.  IMPRESSION: No hardware complications visible status post left total hip replacement.  Original Report Authenticated By: ERIC A. MANSELL, M.D.   Dg Bone Density  06/15/2012  *RADIOLOGY REPORT*  Clinical Data: 66 year old postmenopausal female with depression, sleep apnea and elevated blood pressure.  The patient is scheduled for left hip replacement on 06/24/2012.  She takes calcium and vitamin D.  She took hormones for 16 years in the past.  DUAL X-RAY ABSORPTIOMETRY (DXA) FOR BONE MINERAL DENSITY  RIGHT FEMUR (NECK)  Bone Mineral Density (BMD):             0.803 g/cm2 Young Adult T Score:                           -0.4 Z Score:                                                 1.3  LEFT FOREARM (1/3 RADIUS)  Bone Mineral Density (BMD):                     0.690 g/cm2 Young Adult T Score:                                  -0.1 Z Score:                            1.8  ASSESSMENT:  Patient's diagnostic category is NORMAL by WHO Criteria.  FRACTURE RISK: NOT INCREASED  FRAX: Not applicable  Spine is not included due to scoliosis and degenerative change.  Comparison: None.  Please note that it is not possible to compare data from different instruments.  RECOMMENDATIONS:  Effective therapies are available in the form of  bisphosphonates, selective estrogen receptor modulators, biologic agents, and hormone replacement therapy (for women).  All patients should ensure an adequate intake of dietary calcium (1200mg  daily) and vitamin D (800 IU daily) unless contraindicated.  All treatment decisions require clinical judgement and consideration of individual patient factors, including patient preferences, co-morbidities, previous drug use, risk factors not captured in the FRAX model (e.g., frailty, falls, vitamin D deficiency, increased bone turnover, interval significant decline in bone density) and possible under-or over-estimation of fracture risk by FRAX.  The National Osteoporosis Foundation recommends that FDA-approved medical therapies be considered in postmenopausal women and mean age 46 or older with a:        1)     Hip or vertebral (clinical or morphometric) fracture.           2)    T-score  of -2.5 or lower at the spine or hip. 3)    Ten-year fracture probability by FRAX of 3% or greater for hip fracture or 20% or greater for major osteoporotic fracture. FOLLOW-UP:  People with diagnosed cases of osteoporosis or at high risk for fracture should have regular bone mineral density tests.  For patients eligible for Medicare, routine testing is allowed once every 2 years.  The testing frequency can be increased to one year for patients who have rapidly progressing disease, those who are receiving or discontinuing medical therapy to restore bone mass, or have additional risk factors.  [employer] writer [facility]) Criteria:  Normal: T scores from +1.0 to -1.0 Low Bone Mass (Osteopenia): T scores between -1.0 and -2.5 Osteoporosis: T scores -2.5 and below  Comparison to Reference Population:  T score is the key measure used in the diagnosis of osteoporosis and relative risk determination for fracture.  It provides a value for bone mass relative to the mean bone mass of a young adult reference population expressed in terms of  standard deviation (SD).  Z score is the age-matched score showing the patient's values compared to a population matched for age, sex, and race.  This is also expressed in terms of standard deviation.  The patient may have values that compare favorably to the age-matched values and still be at increased risk for fracture.  Original Report Authenticated By: Daryl Eastern, M.D.   Dg Hip Portable 1 View Left  06/24/2012  *RADIOLOGY REPORT*  Clinical Data: Hip pain  PORTABLE LEFT HIP - 1 VIEW  Comparison: C-arm films earlier in the day  Findings: A cross-table lateral view demonstrates left T H R good position.  IMPRESSION: As above.  Original Report Authenticated By: Elsie Stain, M.D.   Mm Digital Screening  06/17/2012  *RADIOLOGY REPORT*  Clinical Data: Screening.  DIGITAL BILATERAL SCREENING MAMMOGRAM WITH CAD DIGITAL BREAST TOMOSYNTHESIS  Digital breast tomosynthesis images are acquired in two projections.  These images are reviewed in combination with the digital mammogram, confirming the findings below.  Comparison:  Previous exams.  Findings:  There are scattered fibroglandular densities. No suspicious masses, architectural distortion, or calcifications are present.  Images were processed with CAD.  IMPRESSION: No mammographic evidence of malignancy.  A result letter of this screening mammogram will be mailed directly to the patient.  RECOMMENDATION: Screening mammogram in one year. (Code:SM-B-01Y)  BI-RADS CATEGORY 1:  Negative.  Original Report Authenticated By: Harrel Lemon, M.D.   Dg C-arm 61-120 Min-no Report  06/24/2012  CLINICAL DATA: surgery   C-ARM 61-120 MINUTES  Fluoroscopy was utilized by the requesting physician.  No radiographic  interpretation.      Disposition: 03-Skilled Nursing Facility  Discharge Orders    Future Orders Please Complete By Expires   Discharge patient            Signed: Kathryne Hitch 07/04/2012, 6:45 AM

## 2012-07-04 NOTE — Progress Notes (Signed)
ANTICOAGULATION CONSULT NOTE - Follow Up Consult  Pharmacy Consult for Coumadin Indication: VTE prophylaxis  Allergies  Allergen Reactions  . Penicillins Other (See Comments)    Tongue swelling     Patient Measurements: Height: 5\' 3"  (160 cm) Weight: 160 lb (72.576 kg) IBW/kg (Calculated) : 52.4  Heparin Dosing Weight:   Vital Signs: Temp: 98.8 F (37.1 C) (08/26 0410) Temp src: Oral (08/26 0410) BP: 99/63 mmHg (08/26 0410) Pulse Rate: 84  (08/26 0410)  Labs:  Basename 07/04/12 0400 07/03/12 0946 07/03/12 0505 07/03/12 0200  HGB 9.1* -- 9.6* --  HCT 27.2* -- 28.5* 51.7*  PLT 277 -- 258 110*  APTT -- -- -- --  LABPROT 14.6 13.6 -- --  INR 1.12 1.02 -- --  HEPARINUNFRC -- -- -- --  CREATININE 0.64 -- -- 0.68  CKTOTAL -- -- -- --  CKMB -- -- -- --  TROPONINI -- -- -- --    Estimated Creatinine Clearance: 65.2 ml/min (by C-G formula based on Cr of 0.64).   Assessment: 63 yoF on D#2 anticoagulation for ? Cellulitis vs DVT.  Dopplers negative for DVT.   [facility] has been d/c'd, pt continued coumadin.   INR 1.02-->1.12 after 1 dose coumadin 5mg .   Hgb low, plts ok. No bleed noted.   Pt initially to be d/c'd today - held off d/t blood cultures. Pt to start on doxy 10 day course which can cause elevations in INR as outpatient.   Goal of Therapy:  INR 2-3 Monitor platelets by anticoagulation protocol: Yes   Plan:  Repeat Coumadin 5mg  po x 1 tonight. F/u AM PT/INR, CBC  Starlet Gallentine E 07/04/2012,11:57 AM

## 2012-07-04 NOTE — Progress Notes (Signed)
Patient ID: Molly Lucas, female   DOB: October 11, 1946, 66 y.o.   MRN: [id number] Although the patient has been feeling better and her WBC has been normal, her blood culture has now grown out gram positive cocci.  This could be a contaminant, however, I will now need to keep her and repeat her blood cultures.  I will also obtain a fluoro-guided hip aspiration and send for cel count and cultures to R/O an infected joint.  Will hold on discharge for now.

## 2012-07-04 NOTE — Progress Notes (Signed)
CSW following to assist with d/c planning. CSW placed pt at [facility] on 06/28/12 for ST rehab. Pt was re-hospitalized on 07/02/12. CSW met with pt this am to assist with d/c planning. Pt plans to return to [facility], possibly tomorrow, if medically stable. SNF contacted and is able to readmit when ready for d/c. CSW will continue to folow to assist with d/c planning back to [facility].  Molly Muir Katelyne Galster LCSW [phone]

## 2012-07-04 NOTE — Evaluation (Signed)
Physical Therapy Evaluation Patient Details Name: Molly Lucas MRN: [id number] DOB: January 17, 1946 Today's Date: 07/04/2012 Time: 1400-1416 PT Time Calculation (min): 16 min  PT Assessment / Plan / Recommendation Clinical Impression  Pt presents with possible cellulitis is L hip.  Pt s/p L direct anterior THR on 8/16 and had D/C'd to [facility], however noted swelling and increased pain in hip and returned to [facility].  Tolerated ambulation well with increase c/o pain, however pt impuslive to get OOB without assist, therefore provided cues for safety and to have someone with her.  Pt will benefit from skilled PT in acute venue to address deficits.  PT recommends SNF for follow up at D/C.     PT Assessment  Patient needs continued PT services    Follow Up Recommendations  Skilled nursing facility    Barriers to Discharge Decreased caregiver support      Equipment Recommendations  Defer to next venue    Recommendations for Other Services     Frequency 7X/week    Precautions / Restrictions Precautions Precautions: Fall Precaution Comments: Fall risk due to impulsive getting up.  Restrictions Weight Bearing Restrictions: No Other Position/Activity Restrictions: WBAT   Pertinent Vitals/Pain 8/10      Mobility  Bed Mobility Bed Mobility: Not assessed Details for Bed Mobility Assistance: Pt up and ambulating to hallway when PT arrived.  Transfers Transfers: Stand to Sit Stand to Sit: 5: Supervision;To bed Details for Transfer Assistance: Min cues for safety.  Ambulation/Gait Ambulation/Gait Assistance: 5: Supervision Ambulation Distance (Feet): 200 Feet Assistive device: Rolling walker Ambulation/Gait Assistance Details: Cues for upright posture, maintaining close enough position to RW when ambulating and relaxed posture due to noted increased shoulder shrug on R.   Gait Pattern: Step-to pattern;Antalgic;Decreased stride length;Trunk flexed Gait velocity: decreased Stairs:  No Wheelchair Mobility Wheelchair Mobility: No    Exercises     PT Diagnosis: Difficulty walking;Acute pain;Generalized weakness  PT Problem List: Decreased strength;Decreased range of motion;Decreased activity tolerance;Decreased balance;Decreased mobility;Decreased knowledge of use of DME;Decreased safety awareness;Pain PT Treatment Interventions: DME instruction;Gait training;Functional mobility training;Therapeutic activities;Therapeutic exercise;Balance training;Patient/family education   PT Goals Acute Rehab PT Goals PT Goal Formulation: With patient Time For Goal Achievement: 07/11/12 Potential to Achieve Goals: Good Pt will go Supine/Side to Sit: with supervision PT Goal: Supine/Side to Sit - Progress: Goal set today Pt will go Sit to Supine/Side: with supervision PT Goal: Sit to Supine/Side - Progress: Goal set today Pt will go Sit to Stand: with supervision PT Goal: Sit to Stand - Progress: Goal set today Pt will go Stand to Sit: with modified independence PT Goal: Stand to Sit - Progress: Goal set today Pt will Ambulate: 51 - 150 feet;with modified independence;with least restrictive assistive device PT Goal: Ambulate - Progress: Goal set today  Visit Information  Last PT Received On: 07/04/12 Assistance Needed: +1    Subjective Data  Subjective: I want to get up out of bed.  Patient Stated Goal: to get back to rehab   Prior Functioning  Home Living Lives With: Alone Type of Home: Skilled Nursing Facility Prior Function Level of Independence: Independent Driving: Yes Vocation: Retired Musician: No difficulties Dominant Hand: Right    Cognition  Overall Cognitive Status: Appears within functional limits for tasks assessed/performed Arousal/Alertness: Awake/alert Orientation Level: Appears intact for tasks assessed Behavior During Session: [facility] for tasks performed    Extremity/Trunk Assessment Right Lower Extremity Assessment RLE  ROM/Strength/Tone: [facility] for tasks assessed Left Lower Extremity Assessment LLE ROM/Strength/Tone:  Deficits LLE ROM/Strength/Tone Deficits: decreased hip strength due to gait pattern and inability to get LE into bed.    Balance    End of Session PT - End of Session Activity Tolerance: Patient tolerated treatment well;Patient limited by pain Patient left: in bed;with call bell/phone within reach;with family/visitor present Nurse Communication: Mobility status  GP     Page, Meribeth Mattes 07/04/2012, 2:40 PM

## 2012-07-04 NOTE — Progress Notes (Signed)
Subjective:   Fells better overall. Doppler neg. For DVT.  Objective: Vital signs in last 24 hours: Temp:  [98.8 F (37.1 C)-99.5 F (37.5 C)] 98.8 F (37.1 C) (08/26 0410) Pulse Rate:  [84-88] 84  (08/26 0410) Resp:  [20] 20  (08/26 0410) BP: (95-136)/(61-78) 99/63 mmHg (08/26 0410) SpO2:  [92 %-99 %] 92 % (08/26 0410)  Intake/Output from previous day: 08/25 0701 - 08/26 0700 In: 150 [IV Piggyback:150] Out: -  Intake/Output this shift:     Basename 07/04/12 0400 07/03/12 0505 07/03/12 0200  HGB 9.1* 9.6* 17.9*    Basename 07/04/12 0400 07/03/12 0505  WBC 6.1 6.3  RBC 2.86* 2.99*  HCT 27.2* 28.5*  PLT 277 258    Basename 07/04/12 0400 07/03/12 0200  NA 139 137  K 3.7 3.6  CL 107 101  CO2 26 28  BUN 7 10  CREATININE 0.64 0.68  GLUCOSE 91 102*  CALCIUM 8.8 8.9    Basename 07/04/12 0400 07/03/12 0946  LABPT -- --  INR 1.12 1.02    Sensation intact distally Intact pulses distally Dorsiflexion/Plantar flexion intact Incision: no drainage No cellulitis present Compartment soft  Assessment/Plan: Post-op swelling post THA.  R/O DVT and infection 1) return to [facility] today.   Kathryne Hitch 07/04/2012, 6:39 AM

## 2012-07-05 ENCOUNTER — Inpatient Hospital Stay (HOSPITAL_COMMUNITY): Payer: Medicare Other

## 2012-07-05 DIAGNOSIS — S79919A Unspecified injury of unspecified hip, initial encounter: Secondary | ICD-10-CM | POA: Diagnosis not present

## 2012-07-05 DIAGNOSIS — Z5189 Encounter for other specified aftercare: Secondary | ICD-10-CM | POA: Diagnosis not present

## 2012-07-05 DIAGNOSIS — D62 Acute posthemorrhagic anemia: Secondary | ICD-10-CM | POA: Diagnosis not present

## 2012-07-05 DIAGNOSIS — M1990 Unspecified osteoarthritis, unspecified site: Secondary | ICD-10-CM | POA: Diagnosis not present

## 2012-07-05 DIAGNOSIS — M1610 Unilateral primary osteoarthritis, unspecified hip: Secondary | ICD-10-CM | POA: Diagnosis not present

## 2012-07-05 DIAGNOSIS — M169 Osteoarthritis of hip, unspecified: Secondary | ICD-10-CM | POA: Diagnosis not present

## 2012-07-05 DIAGNOSIS — Z96649 Presence of unspecified artificial hip joint: Secondary | ICD-10-CM | POA: Diagnosis not present

## 2012-07-05 DIAGNOSIS — F411 Generalized anxiety disorder: Secondary | ICD-10-CM | POA: Diagnosis not present

## 2012-07-05 DIAGNOSIS — G609 Hereditary and idiopathic neuropathy, unspecified: Secondary | ICD-10-CM | POA: Diagnosis not present

## 2012-07-05 DIAGNOSIS — F329 Major depressive disorder, single episode, unspecified: Secondary | ICD-10-CM | POA: Diagnosis not present

## 2012-07-05 DIAGNOSIS — F3289 Other specified depressive episodes: Secondary | ICD-10-CM | POA: Diagnosis not present

## 2012-07-05 DIAGNOSIS — I10 Essential (primary) hypertension: Secondary | ICD-10-CM | POA: Diagnosis not present

## 2012-07-05 DIAGNOSIS — M7989 Other specified soft tissue disorders: Secondary | ICD-10-CM | POA: Diagnosis not present

## 2012-07-05 DIAGNOSIS — M25559 Pain in unspecified hip: Secondary | ICD-10-CM | POA: Diagnosis not present

## 2012-07-05 DIAGNOSIS — D649 Anemia, unspecified: Secondary | ICD-10-CM | POA: Diagnosis not present

## 2012-07-05 DIAGNOSIS — E785 Hyperlipidemia, unspecified: Secondary | ICD-10-CM | POA: Diagnosis not present

## 2012-07-05 DIAGNOSIS — Z471 Aftercare following joint replacement surgery: Secondary | ICD-10-CM | POA: Diagnosis not present

## 2012-07-05 DIAGNOSIS — E7800 Pure hypercholesterolemia, unspecified: Secondary | ICD-10-CM | POA: Diagnosis not present

## 2012-07-05 LAB — SYNOVIAL CELL COUNT + DIFF, W/ CRYSTALS
Crystals, Fluid: NONE SEEN
Lymphocytes-Synovial Fld: 16 % (ref 0–20)
Monocyte-Macrophage-Synovial Fluid: 8 % — ABNORMAL LOW (ref 50–90)
Neutrophil, Synovial: 75 % — ABNORMAL HIGH (ref 0–25)

## 2012-07-05 LAB — PROTIME-INR
INR: 1.16 (ref 0.00–1.49)
Prothrombin Time: 15 seconds (ref 11.6–15.2)

## 2012-07-05 MED ORDER — WARFARIN SODIUM 7.5 MG PO TABS
7.5000 mg | ORAL_TABLET | Freq: Once | ORAL | Status: AC
  Administered 2012-07-05: 7.5 mg via ORAL
  Filled 2012-07-05: qty 1

## 2012-07-05 NOTE — Progress Notes (Signed)
ANTICOAGULATION CONSULT NOTE - Follow Up Consult  Pharmacy Consult for Coumadin Indication: VTE PPX  Allergies  Allergen Reactions  . Penicillins Other (See Comments)    Tongue swelling     Patient Measurements: Height: 5\' 3"  (160 cm) Weight: 160 lb (72.576 kg) IBW/kg (Calculated) : 52.4   Vital Signs: Temp: 98.5 F (36.9 C) (08/27 0516) Temp src: Oral (08/27 0516) BP: 91/54 mmHg (08/27 0516) Pulse Rate: 76  (08/27 0516)  Labs:  Basename 07/05/12 0432 07/04/12 0400 07/03/12 0946 07/03/12 0505 07/03/12 0200  HGB -- 9.1* -- 9.6* --  HCT -- 27.2* -- 28.5* 51.7*  PLT -- 277 -- 258 110*  APTT -- -- -- -- --  LABPROT 15.0 14.6 13.6 -- --  INR 1.16 1.12 1.02 -- --  HEPARINUNFRC -- -- -- -- --  CREATININE -- 0.64 -- -- 0.68  CKTOTAL -- -- -- -- --  CKMB -- -- -- -- --  TROPONINI -- -- -- -- --    Estimated Creatinine Clearance: 65.2 ml/min (by C-G formula based on Cr of 0.64).   Medications:  Prescriptions prior to admission  Medication Sig Dispense Refill  . aspirin EC 325 MG EC tablet Take 1 tablet (325 mg total) by mouth 2 (two) times daily.  30 tablet  0  . Cholecalciferol (VITAMIN D3) 5000 UNITS TABS Take 10,000 mg by mouth every morning.      Marland Kitchen CINNAMON PO Take 4,000 mg by mouth every morning.      . clorazepate (TRANXENE) 7.5 MG tablet Take 11.25 mg by mouth at bedtime. Pt takes 1 and 1/2 tablet for a total dose of 11.25mg       . DULoxetine (CYMBALTA) 60 MG capsule Take 60 mg by mouth at bedtime.      . ferrous sulfate 325 (65 FE) MG tablet Take 1 tablet (325 mg total) by mouth 3 (three) times daily after meals.  20 tablet  0  . gabapentin (NEURONTIN) 300 MG capsule Take 300 mg by mouth 2 (two) times daily.      Marland Kitchen L-Methylfolate-Algae (DEPLIN 15 PO) Take 15 mg by mouth at bedtime.      Marland Kitchen losartan (COZAAR) 100 MG tablet Take 100 mg by mouth every morning.      . Omega-3 Fatty Acids (SUPER OMEGA 3 EPA/DHA PO) Take 3 capsules by mouth every morning.      Marland Kitchen  oxyCODONE-acetaminophen (ROXICET) 5-325 MG per tablet Take 1-2 tablets by mouth every 4 (four) hours as needed for pain.  60 tablet  0  . pravastatin (PRAVACHOL) 20 MG tablet Take 20 mg by mouth daily.      Marland Kitchen PRESCRIPTION MEDICATION Take 2 tablets by mouth every morning. Pt takes a dietary supplement. Called hairgrow.  ( vitamin E,Folic acid, folate. Biotin)      . DISCONTD: nabumetone (RELAFEN) 500 MG tablet Take 500 mg by mouth 2 (two) times daily.        Scheduled:    . cholecalciferol  10,000 Units Oral Daily  . clorazepate  11.25 mg Oral QHS  . DULoxetine  60 mg Oral QHS  . gabapentin  300 mg Oral QHS  . losartan  100 mg Oral q morning - 10a  . simvastatin  10 mg Oral q1800  . vancomycin  750 mg Intravenous Q12H  . warfarin  5 mg Oral ONCE-1800  . Warfarin - Pharmacist Dosing Inpatient   Does not apply q1800   Infusions:    . sodium chloride  PRN: bisacodyl, docusate sodium, HYDROcodone-acetaminophen, methocarbamol, ondansetron (ZOFRAN) IV, ondansetron, oxyCODONE  Assessment:  65 YOF admitted from [facility] 8/24 PM with LLE swelling/pain s/p L THR which was done 8/16. One dose of Lovenox 80mg  8/25 0230 x R/O VTE given per ER MD.  Pt was started on Coumadin for R/O VTE 8/25, Doppler was negative for VTE 8/25. Coumadin continued for VTE ppx.  Pt has received 2 x 5mg  Coumadin, INR slow to rise.   H/H down/low, no bleeding reported  Goal of Therapy:  INR 2-3   Plan:  Coumadin 7.5mg  today Daily INR  Gwen Her PharmD  [phone] 07/05/2012 10:24 AM

## 2012-07-05 NOTE — Progress Notes (Signed)
Physical Therapy Treatment Patient Details Name: Molly Lucas MRN: [id number] DOB: Dec 10, 1945 Today's Date: 07/05/2012 Time: 1445-1510 PT Time Calculation (min): 25 min  PT Assessment / Plan / Recommendation Comments on Treatment Session  Pt stated that her proceedure was more involved than she thought, but still wanting to walk in the hallway this afternoon.    Follow Up Recommendations  Skilled nursing facility    Barriers to Discharge        Equipment Recommendations  Defer to next venue    Recommendations for Other Services    Frequency 7X/week   Plan Discharge plan remains appropriate    Precautions / Restrictions Precautions Precautions: Fall Precaution Comments: L THR Anterior Approach Restrictions Weight Bearing Restrictions: No Other Position/Activity Restrictions: WBAT    Pertinent Vitals/Pain C/o 8/10 L hip pain meds requested    Mobility  Bed Mobility Bed Mobility: Supine to Sit Supine to Sit: 4: Min guard Details for Bed Mobility Assistance: increased time  Transfers Transfers: Sit to Stand;Stand to Sit Sit to Stand: 5: Supervision;From bed Stand to Sit: 5: Supervision;To toilet Details for Transfer Assistance: increased time and 25% VC's for safety with turns and backward gait  Ambulation/Gait Ambulation/Gait Assistance: 4: Min guard Ambulation Distance (Feet): 225 Feet Assistive device: Rolling walker Ambulation/Gait Assistance Details: increased tiem and noted increased pain this afternoon with increased pressure on RW and more notable L LE limp. Gait Pattern: Step-through pattern;Decreased stance time - left Gait velocity: decreased     PT Goals                        progressing    Visit Information  Last PT Received On: 07/05/12 Assistance Needed: +1    Subjective Data  Subjective: That procedure was more involved then I thought (fluid aspiration L hip) Patient Stated Goal: Rehab             End of Session PT - End of  Session Equipment Utilized During Treatment: Gait belt Activity Tolerance: Patient tolerated treatment well Patient left: Other (comment);with call bell/phone within reach (In bathroom with NT)   Felecia Shelling  PTA [facility]  Acute  Rehab Pager     [phone]

## 2012-07-05 NOTE — Procedures (Signed)
*  RADIOLOGY REPORT* Clinical Data: [Left upper leg swelling, query infection of recent total hip placement.] Fluoroscopically guided left hip aspiration. Comparison: [06/24/2012 Fluoroscopy time:  0.5 minutes] Findings: [I discussed the risks (including the increased risk of hemorrhage due to Coumadin medication, and risk of introducing infection, among others), benefits, and alternatives to fluoroscopically guided left hip aspiration.  We discussed the high likelihood of technical success of the procedure.  The patient understood and elected to undergo the procedure. Standard time-out was employed. The dressing over the anterior wound was removed, showing a clean and dry anterior wound with skin clips.  There is lateral swelling along the thigh.  No gross erythema or signs of cellulitis overlying the optimum region for needle placement as determined by fluoroscopy.  I carefully palpated the femoral artery to locate and avoid major vascular structures prior to puncture. Following sterile skin prep and local anesthetic administration consisting of 1% lidocaine, an 18 gauge needle was advanced without difficulty to the neck of the implant.  No fluid return was observed under suction.  After slight repositioning with lack of fluid aspiration, I injected 10 ml of sterile normal saline through the needle and along the right hip implant neck.  I subsequently withdrew this fluid which came back blood tinged. The needle was removed and the skin cleansed and bandaged.  No immediate complications observed.] IMPRESSION: [ 1.  Fluoroscopically guided left hip aspiration with needle against the neck of the implant.  No fluid was initially obtained, so I injected 10 ml of sterile normal saline and subsequently withdrew the fluid through the needle, which came back blood tinged.  This was sent for analysis.]

## 2012-07-05 NOTE — Progress Notes (Signed)
Patient ID: Molly Lucas, female   DOB: Sep 29, 1946, 66 y.o.   MRN: [id number] I needed to keep Ms. Prettyman due to one positive blood culture.  May be contaminant, but repeated blood cultures.  Will have radiology obtain fluoro guided aspiration of left hip joint today and send for gram stain, culture and cell count.  If cell count looks good, can discharge to [facility].

## 2012-07-05 NOTE — Progress Notes (Signed)
Physical Therapy Treatment Patient Details Name: Molly Lucas MRN: [id number] DOB: Dec 25, 1945 Today's Date: 07/05/2012 Time: 4098-1191 PT Time Calculation (min): 25 min  PT Assessment / Plan / Recommendation Comments on Treatment Session  Assisted pt OOB to BR to void, then amb in hallway.  Pt c/o MAX L hip swelling and tightness.  Assisted back to bed per pt request.  Pt plans tio D/C back to SNF.    Follow Up Recommendations  Skilled nursing facility    Barriers to Discharge        Equipment Recommendations  Defer to next venue    Recommendations for Other Services    Frequency 7X/week   Plan Discharge plan remains appropriate    Precautions / Restrictions Precautions Precautions: Fall Precaution Comments: impulsive Restrictions Weight Bearing Restrictions: No    Pertinent Vitals/Pain C/o L hip swelling/tightness    Mobility  Bed Mobility Bed Mobility: Supine to Sit Supine to Sit: 4: Min guard Details for Bed Mobility Assistance: increased time  Transfers Transfers: Sit to Stand;Stand to Sit Sit to Stand: 5: Supervision;From bed;From toilet Stand to Sit: 5: Supervision;To toilet;To chair/3-in-1 Details for Transfer Assistance: one VC on keeping walker close to hips esp with turns and backward gait.  Ambulation/Gait Ambulation/Gait Assistance: 4: Min guard Ambulation Distance (Feet): 215 Feet Assistive device: Rolling walker Ambulation/Gait Assistance Details: one VC to increase L knee flexion and heel stike plus equal weight shifting and weight bearing to facilitate a more natural gait. Gait Pattern: Step-through pattern;Decreased stance time - left Gait velocity: decreased     PT Goals    Visit Information  Last PT Received On: 07/05/12 Assistance Needed: +1    Subjective Data  Subjective: I would love to walk Patient Stated Goal: Rehab             End of Session PT - End of Session Equipment Utilized During Treatment: Gait belt Activity  Tolerance: Patient tolerated treatment well Patient left: in bed;with call bell/phone within reach  Felecia Shelling  PTA [facility]  Acute  Rehab Pager     [phone]

## 2012-07-06 DIAGNOSIS — E7800 Pure hypercholesterolemia, unspecified: Secondary | ICD-10-CM | POA: Diagnosis not present

## 2012-07-06 DIAGNOSIS — G609 Hereditary and idiopathic neuropathy, unspecified: Secondary | ICD-10-CM | POA: Diagnosis not present

## 2012-07-06 DIAGNOSIS — I10 Essential (primary) hypertension: Secondary | ICD-10-CM | POA: Diagnosis not present

## 2012-07-06 DIAGNOSIS — D62 Acute posthemorrhagic anemia: Secondary | ICD-10-CM | POA: Diagnosis not present

## 2012-07-06 DIAGNOSIS — M25559 Pain in unspecified hip: Secondary | ICD-10-CM | POA: Diagnosis not present

## 2012-07-06 NOTE — Progress Notes (Signed)
Pt returned to [facility] 07/05/12 to continue her rehab. Pt was transported by [facility].  Asher Muir Arno Cullers LCSW [phone]

## 2012-07-08 LAB — WOUND CULTURE

## 2012-07-09 LAB — CULTURE, BLOOD (ROUTINE X 2): Culture: NO GROWTH

## 2012-07-10 LAB — CULTURE, BLOOD (ROUTINE X 2)
Culture: NO GROWTH
Special Requests: NORMAL

## 2012-07-12 DIAGNOSIS — M1610 Unilateral primary osteoarthritis, unspecified hip: Secondary | ICD-10-CM | POA: Diagnosis not present

## 2012-07-12 DIAGNOSIS — M25559 Pain in unspecified hip: Secondary | ICD-10-CM | POA: Diagnosis not present

## 2012-07-12 DIAGNOSIS — M169 Osteoarthritis of hip, unspecified: Secondary | ICD-10-CM | POA: Diagnosis not present

## 2012-07-27 DIAGNOSIS — F329 Major depressive disorder, single episode, unspecified: Secondary | ICD-10-CM | POA: Diagnosis not present

## 2012-07-27 DIAGNOSIS — M169 Osteoarthritis of hip, unspecified: Secondary | ICD-10-CM | POA: Diagnosis not present

## 2012-07-27 DIAGNOSIS — IMO0001 Reserved for inherently not codable concepts without codable children: Secondary | ICD-10-CM | POA: Diagnosis not present

## 2012-07-27 DIAGNOSIS — F3289 Other specified depressive episodes: Secondary | ICD-10-CM | POA: Diagnosis not present

## 2012-07-27 DIAGNOSIS — Z471 Aftercare following joint replacement surgery: Secondary | ICD-10-CM | POA: Diagnosis not present

## 2012-07-27 DIAGNOSIS — I10 Essential (primary) hypertension: Secondary | ICD-10-CM | POA: Diagnosis not present

## 2012-07-27 DIAGNOSIS — M1610 Unilateral primary osteoarthritis, unspecified hip: Secondary | ICD-10-CM | POA: Diagnosis not present

## 2012-07-28 DIAGNOSIS — F329 Major depressive disorder, single episode, unspecified: Secondary | ICD-10-CM | POA: Diagnosis not present

## 2012-07-28 DIAGNOSIS — I10 Essential (primary) hypertension: Secondary | ICD-10-CM | POA: Diagnosis not present

## 2012-07-28 DIAGNOSIS — IMO0001 Reserved for inherently not codable concepts without codable children: Secondary | ICD-10-CM | POA: Diagnosis not present

## 2012-07-28 DIAGNOSIS — F3289 Other specified depressive episodes: Secondary | ICD-10-CM | POA: Diagnosis not present

## 2012-07-28 DIAGNOSIS — Z471 Aftercare following joint replacement surgery: Secondary | ICD-10-CM | POA: Diagnosis not present

## 2012-07-28 DIAGNOSIS — F331 Major depressive disorder, recurrent, moderate: Secondary | ICD-10-CM | POA: Diagnosis not present

## 2012-08-02 DIAGNOSIS — F3289 Other specified depressive episodes: Secondary | ICD-10-CM | POA: Diagnosis not present

## 2012-08-02 DIAGNOSIS — I10 Essential (primary) hypertension: Secondary | ICD-10-CM | POA: Diagnosis not present

## 2012-08-02 DIAGNOSIS — F330 Major depressive disorder, recurrent, mild: Secondary | ICD-10-CM | POA: Diagnosis not present

## 2012-08-02 DIAGNOSIS — F329 Major depressive disorder, single episode, unspecified: Secondary | ICD-10-CM | POA: Diagnosis not present

## 2012-08-02 DIAGNOSIS — IMO0001 Reserved for inherently not codable concepts without codable children: Secondary | ICD-10-CM | POA: Diagnosis not present

## 2012-08-02 DIAGNOSIS — Z471 Aftercare following joint replacement surgery: Secondary | ICD-10-CM | POA: Diagnosis not present

## 2012-08-04 DIAGNOSIS — F331 Major depressive disorder, recurrent, moderate: Secondary | ICD-10-CM | POA: Diagnosis not present

## 2012-08-04 DIAGNOSIS — IMO0001 Reserved for inherently not codable concepts without codable children: Secondary | ICD-10-CM | POA: Diagnosis not present

## 2012-08-04 DIAGNOSIS — F329 Major depressive disorder, single episode, unspecified: Secondary | ICD-10-CM | POA: Diagnosis not present

## 2012-08-04 DIAGNOSIS — Z471 Aftercare following joint replacement surgery: Secondary | ICD-10-CM | POA: Diagnosis not present

## 2012-08-04 DIAGNOSIS — I10 Essential (primary) hypertension: Secondary | ICD-10-CM | POA: Diagnosis not present

## 2012-08-04 DIAGNOSIS — F3289 Other specified depressive episodes: Secondary | ICD-10-CM | POA: Diagnosis not present

## 2012-08-08 DIAGNOSIS — IMO0001 Reserved for inherently not codable concepts without codable children: Secondary | ICD-10-CM | POA: Diagnosis not present

## 2012-08-08 DIAGNOSIS — F3289 Other specified depressive episodes: Secondary | ICD-10-CM | POA: Diagnosis not present

## 2012-08-08 DIAGNOSIS — F329 Major depressive disorder, single episode, unspecified: Secondary | ICD-10-CM | POA: Diagnosis not present

## 2012-08-08 DIAGNOSIS — I10 Essential (primary) hypertension: Secondary | ICD-10-CM | POA: Diagnosis not present

## 2012-08-08 DIAGNOSIS — Z471 Aftercare following joint replacement surgery: Secondary | ICD-10-CM | POA: Diagnosis not present

## 2012-08-10 DIAGNOSIS — I10 Essential (primary) hypertension: Secondary | ICD-10-CM | POA: Diagnosis not present

## 2012-08-10 DIAGNOSIS — F329 Major depressive disorder, single episode, unspecified: Secondary | ICD-10-CM | POA: Diagnosis not present

## 2012-08-10 DIAGNOSIS — IMO0001 Reserved for inherently not codable concepts without codable children: Secondary | ICD-10-CM | POA: Diagnosis not present

## 2012-08-10 DIAGNOSIS — F3289 Other specified depressive episodes: Secondary | ICD-10-CM | POA: Diagnosis not present

## 2012-08-10 DIAGNOSIS — Z471 Aftercare following joint replacement surgery: Secondary | ICD-10-CM | POA: Diagnosis not present

## 2012-08-15 DIAGNOSIS — I10 Essential (primary) hypertension: Secondary | ICD-10-CM | POA: Diagnosis not present

## 2012-08-15 DIAGNOSIS — F3289 Other specified depressive episodes: Secondary | ICD-10-CM | POA: Diagnosis not present

## 2012-08-15 DIAGNOSIS — F329 Major depressive disorder, single episode, unspecified: Secondary | ICD-10-CM | POA: Diagnosis not present

## 2012-08-15 DIAGNOSIS — Z471 Aftercare following joint replacement surgery: Secondary | ICD-10-CM | POA: Diagnosis not present

## 2012-08-15 DIAGNOSIS — IMO0001 Reserved for inherently not codable concepts without codable children: Secondary | ICD-10-CM | POA: Diagnosis not present

## 2012-08-16 DIAGNOSIS — IMO0001 Reserved for inherently not codable concepts without codable children: Secondary | ICD-10-CM | POA: Diagnosis not present

## 2012-08-16 DIAGNOSIS — F329 Major depressive disorder, single episode, unspecified: Secondary | ICD-10-CM | POA: Diagnosis not present

## 2012-08-16 DIAGNOSIS — F3289 Other specified depressive episodes: Secondary | ICD-10-CM | POA: Diagnosis not present

## 2012-08-16 DIAGNOSIS — I10 Essential (primary) hypertension: Secondary | ICD-10-CM | POA: Diagnosis not present

## 2012-08-16 DIAGNOSIS — Z471 Aftercare following joint replacement surgery: Secondary | ICD-10-CM | POA: Diagnosis not present

## 2012-08-19 DIAGNOSIS — F331 Major depressive disorder, recurrent, moderate: Secondary | ICD-10-CM | POA: Diagnosis not present

## 2012-08-24 DIAGNOSIS — R269 Unspecified abnormalities of gait and mobility: Secondary | ICD-10-CM | POA: Diagnosis not present

## 2012-08-24 DIAGNOSIS — M25559 Pain in unspecified hip: Secondary | ICD-10-CM | POA: Diagnosis not present

## 2012-08-29 DIAGNOSIS — R269 Unspecified abnormalities of gait and mobility: Secondary | ICD-10-CM | POA: Diagnosis not present

## 2012-08-29 DIAGNOSIS — M25559 Pain in unspecified hip: Secondary | ICD-10-CM | POA: Diagnosis not present

## 2012-09-05 DIAGNOSIS — M25669 Stiffness of unspecified knee, not elsewhere classified: Secondary | ICD-10-CM | POA: Diagnosis not present

## 2012-09-05 DIAGNOSIS — R269 Unspecified abnormalities of gait and mobility: Secondary | ICD-10-CM | POA: Diagnosis not present

## 2012-09-05 DIAGNOSIS — M25559 Pain in unspecified hip: Secondary | ICD-10-CM | POA: Diagnosis not present

## 2012-09-06 DIAGNOSIS — M169 Osteoarthritis of hip, unspecified: Secondary | ICD-10-CM | POA: Diagnosis not present

## 2012-09-06 DIAGNOSIS — M1610 Unilateral primary osteoarthritis, unspecified hip: Secondary | ICD-10-CM | POA: Diagnosis not present

## 2012-09-07 DIAGNOSIS — F331 Major depressive disorder, recurrent, moderate: Secondary | ICD-10-CM | POA: Diagnosis not present

## 2012-09-07 DIAGNOSIS — M25559 Pain in unspecified hip: Secondary | ICD-10-CM | POA: Diagnosis not present

## 2012-09-07 DIAGNOSIS — M25669 Stiffness of unspecified knee, not elsewhere classified: Secondary | ICD-10-CM | POA: Diagnosis not present

## 2012-09-07 DIAGNOSIS — R269 Unspecified abnormalities of gait and mobility: Secondary | ICD-10-CM | POA: Diagnosis not present

## 2012-09-09 DIAGNOSIS — M25559 Pain in unspecified hip: Secondary | ICD-10-CM | POA: Diagnosis not present

## 2012-09-09 DIAGNOSIS — R269 Unspecified abnormalities of gait and mobility: Secondary | ICD-10-CM | POA: Diagnosis not present

## 2012-09-12 DIAGNOSIS — M25559 Pain in unspecified hip: Secondary | ICD-10-CM | POA: Diagnosis not present

## 2012-09-12 DIAGNOSIS — M25669 Stiffness of unspecified knee, not elsewhere classified: Secondary | ICD-10-CM | POA: Diagnosis not present

## 2012-09-12 DIAGNOSIS — F331 Major depressive disorder, recurrent, moderate: Secondary | ICD-10-CM | POA: Diagnosis not present

## 2012-09-12 DIAGNOSIS — R269 Unspecified abnormalities of gait and mobility: Secondary | ICD-10-CM | POA: Diagnosis not present

## 2012-09-14 DIAGNOSIS — R269 Unspecified abnormalities of gait and mobility: Secondary | ICD-10-CM | POA: Diagnosis not present

## 2012-09-14 DIAGNOSIS — M25669 Stiffness of unspecified knee, not elsewhere classified: Secondary | ICD-10-CM | POA: Diagnosis not present

## 2012-09-14 DIAGNOSIS — M25559 Pain in unspecified hip: Secondary | ICD-10-CM | POA: Diagnosis not present

## 2012-09-16 DIAGNOSIS — M25669 Stiffness of unspecified knee, not elsewhere classified: Secondary | ICD-10-CM | POA: Diagnosis not present

## 2012-09-16 DIAGNOSIS — R269 Unspecified abnormalities of gait and mobility: Secondary | ICD-10-CM | POA: Diagnosis not present

## 2012-09-16 DIAGNOSIS — M25559 Pain in unspecified hip: Secondary | ICD-10-CM | POA: Diagnosis not present

## 2012-09-19 DIAGNOSIS — M25559 Pain in unspecified hip: Secondary | ICD-10-CM | POA: Diagnosis not present

## 2012-09-19 DIAGNOSIS — H524 Presbyopia: Secondary | ICD-10-CM | POA: Diagnosis not present

## 2012-09-19 DIAGNOSIS — M25669 Stiffness of unspecified knee, not elsewhere classified: Secondary | ICD-10-CM | POA: Diagnosis not present

## 2012-09-19 DIAGNOSIS — H2510 Age-related nuclear cataract, unspecified eye: Secondary | ICD-10-CM | POA: Diagnosis not present

## 2012-09-19 DIAGNOSIS — R269 Unspecified abnormalities of gait and mobility: Secondary | ICD-10-CM | POA: Diagnosis not present

## 2012-09-21 DIAGNOSIS — F331 Major depressive disorder, recurrent, moderate: Secondary | ICD-10-CM | POA: Diagnosis not present

## 2012-09-21 DIAGNOSIS — M25669 Stiffness of unspecified knee, not elsewhere classified: Secondary | ICD-10-CM | POA: Diagnosis not present

## 2012-09-21 DIAGNOSIS — R269 Unspecified abnormalities of gait and mobility: Secondary | ICD-10-CM | POA: Diagnosis not present

## 2012-09-21 DIAGNOSIS — M25559 Pain in unspecified hip: Secondary | ICD-10-CM | POA: Diagnosis not present

## 2012-09-23 DIAGNOSIS — M25669 Stiffness of unspecified knee, not elsewhere classified: Secondary | ICD-10-CM | POA: Diagnosis not present

## 2012-09-23 DIAGNOSIS — R269 Unspecified abnormalities of gait and mobility: Secondary | ICD-10-CM | POA: Diagnosis not present

## 2012-09-23 DIAGNOSIS — M25559 Pain in unspecified hip: Secondary | ICD-10-CM | POA: Diagnosis not present

## 2012-09-26 DIAGNOSIS — M25669 Stiffness of unspecified knee, not elsewhere classified: Secondary | ICD-10-CM | POA: Diagnosis not present

## 2012-09-26 DIAGNOSIS — M25559 Pain in unspecified hip: Secondary | ICD-10-CM | POA: Diagnosis not present

## 2012-09-26 DIAGNOSIS — R269 Unspecified abnormalities of gait and mobility: Secondary | ICD-10-CM | POA: Diagnosis not present

## 2012-09-28 DIAGNOSIS — M25669 Stiffness of unspecified knee, not elsewhere classified: Secondary | ICD-10-CM | POA: Diagnosis not present

## 2012-09-28 DIAGNOSIS — R269 Unspecified abnormalities of gait and mobility: Secondary | ICD-10-CM | POA: Diagnosis not present

## 2012-09-28 DIAGNOSIS — M25559 Pain in unspecified hip: Secondary | ICD-10-CM | POA: Diagnosis not present

## 2012-09-30 DIAGNOSIS — R269 Unspecified abnormalities of gait and mobility: Secondary | ICD-10-CM | POA: Diagnosis not present

## 2012-09-30 DIAGNOSIS — M25559 Pain in unspecified hip: Secondary | ICD-10-CM | POA: Diagnosis not present

## 2012-09-30 DIAGNOSIS — M25669 Stiffness of unspecified knee, not elsewhere classified: Secondary | ICD-10-CM | POA: Diagnosis not present

## 2012-10-05 DIAGNOSIS — F331 Major depressive disorder, recurrent, moderate: Secondary | ICD-10-CM | POA: Diagnosis not present

## 2012-10-17 DIAGNOSIS — H0019 Chalazion unspecified eye, unspecified eyelid: Secondary | ICD-10-CM | POA: Diagnosis not present

## 2012-10-26 DIAGNOSIS — F330 Major depressive disorder, recurrent, mild: Secondary | ICD-10-CM | POA: Diagnosis not present

## 2012-11-01 DIAGNOSIS — F331 Major depressive disorder, recurrent, moderate: Secondary | ICD-10-CM | POA: Diagnosis not present

## 2012-11-04 DIAGNOSIS — Z23 Encounter for immunization: Secondary | ICD-10-CM | POA: Diagnosis not present

## 2012-11-29 DIAGNOSIS — F331 Major depressive disorder, recurrent, moderate: Secondary | ICD-10-CM | POA: Diagnosis not present

## 2012-12-05 DIAGNOSIS — M1610 Unilateral primary osteoarthritis, unspecified hip: Secondary | ICD-10-CM | POA: Diagnosis not present

## 2012-12-05 DIAGNOSIS — F331 Major depressive disorder, recurrent, moderate: Secondary | ICD-10-CM | POA: Diagnosis not present

## 2012-12-05 DIAGNOSIS — M169 Osteoarthritis of hip, unspecified: Secondary | ICD-10-CM | POA: Diagnosis not present

## 2012-12-05 DIAGNOSIS — M25559 Pain in unspecified hip: Secondary | ICD-10-CM | POA: Diagnosis not present

## 2012-12-21 DIAGNOSIS — F331 Major depressive disorder, recurrent, moderate: Secondary | ICD-10-CM | POA: Diagnosis not present

## 2013-01-02 DIAGNOSIS — F331 Major depressive disorder, recurrent, moderate: Secondary | ICD-10-CM | POA: Diagnosis not present

## 2013-01-09 DIAGNOSIS — F331 Major depressive disorder, recurrent, moderate: Secondary | ICD-10-CM | POA: Diagnosis not present

## 2013-01-24 DIAGNOSIS — F331 Major depressive disorder, recurrent, moderate: Secondary | ICD-10-CM | POA: Diagnosis not present

## 2013-01-26 DIAGNOSIS — F330 Major depressive disorder, recurrent, mild: Secondary | ICD-10-CM | POA: Diagnosis not present

## 2013-01-31 DIAGNOSIS — F331 Major depressive disorder, recurrent, moderate: Secondary | ICD-10-CM | POA: Diagnosis not present

## 2013-02-01 DIAGNOSIS — R059 Cough, unspecified: Secondary | ICD-10-CM | POA: Diagnosis not present

## 2013-02-01 DIAGNOSIS — J309 Allergic rhinitis, unspecified: Secondary | ICD-10-CM | POA: Diagnosis not present

## 2013-02-01 DIAGNOSIS — R05 Cough: Secondary | ICD-10-CM | POA: Diagnosis not present

## 2013-02-01 DIAGNOSIS — J069 Acute upper respiratory infection, unspecified: Secondary | ICD-10-CM | POA: Diagnosis not present

## 2013-02-27 DIAGNOSIS — F331 Major depressive disorder, recurrent, moderate: Secondary | ICD-10-CM | POA: Diagnosis not present

## 2013-03-07 DIAGNOSIS — F331 Major depressive disorder, recurrent, moderate: Secondary | ICD-10-CM | POA: Diagnosis not present

## 2013-03-14 ENCOUNTER — Ambulatory Visit (INDEPENDENT_AMBULATORY_CARE_PROVIDER_SITE_OTHER): Payer: Medicare Other | Admitting: Surgery

## 2013-03-14 ENCOUNTER — Encounter (INDEPENDENT_AMBULATORY_CARE_PROVIDER_SITE_OTHER): Payer: Self-pay | Admitting: Surgery

## 2013-03-14 VITALS — BP 136/82 | HR 71 | Temp 97.1°F | Resp 16 | Ht 62.0 in | Wt 177.0 lb

## 2013-03-14 DIAGNOSIS — K4090 Unilateral inguinal hernia, without obstruction or gangrene, not specified as recurrent: Secondary | ICD-10-CM | POA: Diagnosis not present

## 2013-03-14 NOTE — Progress Notes (Signed)
Patient ID: Molly Lucas, female   DOB: [birth year], 67 y.o.   MRN: [id number]  Chief Complaint  Patient presents with  . Other    possible inguinal hernia    HPI Molly Lucas is a 67 y.o. female.   HPI This is a very pleasant female referred by Dr. Jarold Motto for evaluation of a right inguinal hernia. She reports it has been present for over a year. She is virtually asymptomatic. It may be getting slightly larger. She has had no objective symptoms. She is recovering from hip replacement surgery and is doing well Past Medical History  Diagnosis Date  . Hypertension   . Anxiety   . Depression   . Sleep apnea     cpap does not use, dentist made mouthipiece   . Arthritis     Past Surgical History  Procedure Laterality Date  . Wisdom tooth extraction    . Uterine fibroid surgery    . Total hip arthroplasty  06/24/2012    Procedure: TOTAL HIP ARTHROPLASTY ANTERIOR APPROACH;  Surgeon: Kathryne Hitch, MD;  Location: [facility];  Service: Orthopedics;  Laterality: Left;  Left total hip arthroplasty, anterior approach    No family history on file.  Social History History  Substance Use Topics  . Smoking status: Never Smoker   . Smokeless tobacco: Never Used  . Alcohol Use: Yes     Comment: occasional glass of wine     Allergies  Allergen Reactions  . Penicillins Other (See Comments)    Tongue swelling     Current Outpatient Prescriptions  Medication Sig Dispense Refill  . Cholecalciferol (VITAMIN D3) 5000 UNITS TABS Take 10,000 mg by mouth every morning.      Marland Kitchen CINNAMON PO Take 4,000 mg by mouth every morning.      . clorazepate (TRANXENE) 7.5 MG tablet Take 11.25 mg by mouth at bedtime. Pt takes 1 and 1/2 tablet for a total dose of 11.25mg       . DULoxetine (CYMBALTA) 60 MG capsule Take 60 mg by mouth at bedtime.      . ferrous sulfate 325 (65 FE) MG tablet Take 1 tablet (325 mg total) by mouth 3 (three) times daily after meals.  20 tablet  0  . fluticasone (FLONASE)  50 MCG/ACT nasal spray       . gabapentin (NEURONTIN) 300 MG capsule Take 300 mg by mouth 2 (two) times daily.      Marland Kitchen L-Methylfolate-Algae (DEPLIN 15 PO) Take 15 mg by mouth at bedtime.      . lidocaine (XYLOCAINE) 2 % solution       . losartan (COZAAR) 100 MG tablet Take 100 mg by mouth every morning.      . Omega-3 Fatty Acids (SUPER OMEGA 3 EPA/DHA PO) Take 3 capsules by mouth every morning.      . pravastatin (PRAVACHOL) 20 MG tablet Take 20 mg by mouth daily.      Marland Kitchen PRESCRIPTION MEDICATION Take 2 tablets by mouth every morning. Pt takes a dietary supplement. Called hairgrow.  ( vitamin E,Folic acid, folate. Biotin)       No current facility-administered medications for this visit.    Review of Systems Review of Systems  Constitutional: Negative for fever, chills and unexpected weight change.  HENT: Negative for hearing loss, congestion, sore throat, trouble swallowing and voice change.   Eyes: Negative for visual disturbance.  Respiratory: Negative for cough and wheezing.   Cardiovascular: Negative for chest pain, palpitations and leg  swelling.  Gastrointestinal: Negative for nausea, vomiting, abdominal pain, diarrhea, constipation, blood in stool, abdominal distention and anal bleeding.  Genitourinary: Negative for hematuria, vaginal bleeding and difficulty urinating.  Musculoskeletal: Positive for joint swelling, arthralgias and gait problem.  Skin: Negative for rash and wound.  Neurological: Negative for seizures, syncope and headaches.  Hematological: Negative for adenopathy. Does not bruise/bleed easily.  Psychiatric/Behavioral: Negative for confusion.    Blood pressure 136/82, pulse 71, temperature 97.1 F (36.2 C), temperature source Temporal, resp. rate 16, height 5\' 2"  (1.575 m), weight 177 lb (80.287 kg).  Physical Exam Physical Exam  Constitutional: She is oriented to person, place, and time. She appears well-developed and well-nourished. No distress.  HENT:  Head:  Normocephalic and atraumatic.  Right Ear: External ear normal.  Left Ear: External ear normal.  Mouth/Throat: No oropharyngeal exudate.  Eyes: Conjunctivae are normal. Pupils are equal, round, and reactive to light. Right eye exhibits no discharge. Left eye exhibits no discharge. No scleral icterus.  Neck: Normal range of motion. Neck supple. No tracheal deviation present. No thyromegaly present.  Cardiovascular: Normal rate, regular rhythm and normal heart sounds.   No murmur heard. Pulmonary/Chest: Effort normal and breath sounds normal. No respiratory distress. She has no wheezes.  Abdominal: Soft. Bowel sounds are normal. She exhibits no distension. There is no tenderness.  There is a large, easily reducible right inguinal hernia. There is no evidence of left inguinal hernia  Lymphadenopathy:    She has no cervical adenopathy.  Neurological: She is alert and oriented to person, place, and time.  Skin: Skin is warm and dry. No rash noted. No erythema.  Psychiatric: Her behavior is normal.    Data Reviewed   Assessment    Right inguinal hernia     Plan    As this is quite large, repair with mesh was recommended. I discussed this with her in detail. I discussed the risks of surgery which includes but is not limited to bleeding, infection, nerve entrapment, chronic pain, recurrence. She understands and wishes to proceed. Surgery will be scheduled at her convenience.        Halvor Behrend A 03/14/2013, 11:31 AM

## 2013-03-16 DIAGNOSIS — I10 Essential (primary) hypertension: Secondary | ICD-10-CM | POA: Diagnosis not present

## 2013-03-16 DIAGNOSIS — Z6831 Body mass index (BMI) 31.0-31.9, adult: Secondary | ICD-10-CM | POA: Diagnosis not present

## 2013-03-16 DIAGNOSIS — E785 Hyperlipidemia, unspecified: Secondary | ICD-10-CM | POA: Diagnosis not present

## 2013-03-27 ENCOUNTER — Other Ambulatory Visit: Payer: Self-pay | Admitting: Neurosurgery

## 2013-03-27 DIAGNOSIS — M5137 Other intervertebral disc degeneration, lumbosacral region: Secondary | ICD-10-CM

## 2013-03-27 DIAGNOSIS — F331 Major depressive disorder, recurrent, moderate: Secondary | ICD-10-CM | POA: Diagnosis not present

## 2013-03-27 DIAGNOSIS — M4120 Other idiopathic scoliosis, site unspecified: Secondary | ICD-10-CM

## 2013-03-27 DIAGNOSIS — D216 Benign neoplasm of connective and other soft tissue of trunk, unspecified: Secondary | ICD-10-CM

## 2013-03-27 DIAGNOSIS — M47817 Spondylosis without myelopathy or radiculopathy, lumbosacral region: Secondary | ICD-10-CM

## 2013-03-28 ENCOUNTER — Other Ambulatory Visit: Payer: Medicare Other

## 2013-04-18 ENCOUNTER — Ambulatory Visit
Admission: RE | Admit: 2013-04-18 | Discharge: 2013-04-18 | Disposition: A | Discharge status: Home / Self Care | Payer: Medicare Other | Source: Ambulatory Visit | Attending: Neurosurgery | Admitting: Neurosurgery

## 2013-04-18 DIAGNOSIS — M47817 Spondylosis without myelopathy or radiculopathy, lumbosacral region: Secondary | ICD-10-CM

## 2013-04-18 DIAGNOSIS — M4120 Other idiopathic scoliosis, site unspecified: Secondary | ICD-10-CM

## 2013-04-18 DIAGNOSIS — M5137 Other intervertebral disc degeneration, lumbosacral region: Secondary | ICD-10-CM

## 2013-04-18 DIAGNOSIS — D216 Benign neoplasm of connective and other soft tissue of trunk, unspecified: Secondary | ICD-10-CM

## 2013-04-18 MED ORDER — GADOBENATE DIMEGLUMINE 529 MG/ML IV SOLN
15.0000 mL | Freq: Once | INTRAVENOUS | Status: AC | PRN
  Administered 2013-04-18: 15 mL via INTRAVENOUS

## 2013-04-21 ENCOUNTER — Encounter (HOSPITAL_COMMUNITY): Payer: Self-pay | Admitting: Pharmacy Technician

## 2013-04-24 DIAGNOSIS — F330 Major depressive disorder, recurrent, mild: Secondary | ICD-10-CM | POA: Diagnosis not present

## 2013-04-25 ENCOUNTER — Encounter (HOSPITAL_COMMUNITY)
Admission: RE | Admit: 2013-04-25 | Discharge: 2013-04-25 | Disposition: A | Discharge status: Home / Self Care | Payer: Medicare Other | Source: Ambulatory Visit | Attending: Surgery | Admitting: Surgery

## 2013-04-25 DIAGNOSIS — M48061 Spinal stenosis, lumbar region without neurogenic claudication: Secondary | ICD-10-CM | POA: Diagnosis not present

## 2013-04-25 DIAGNOSIS — M4120 Other idiopathic scoliosis, site unspecified: Secondary | ICD-10-CM | POA: Diagnosis not present

## 2013-04-25 DIAGNOSIS — D492 Neoplasm of unspecified behavior of bone, soft tissue, and skin: Secondary | ICD-10-CM | POA: Diagnosis not present

## 2013-04-25 DIAGNOSIS — M5137 Other intervertebral disc degeneration, lumbosacral region: Secondary | ICD-10-CM | POA: Diagnosis not present

## 2013-04-25 NOTE — Pre-Procedure Instructions (Signed)
Molly Lucas  04/25/2013   Your procedure is scheduled on:  05/03/13  Report to [facility] at 900 AM.  Call this number if you have problems the morning of surgery: [phone]   Remember:   Do not eat food or drink liquids after midnight.   Take these medicines the morning of surgery with A SIP OF WATER: cymbalta                                  STOP  Aspirin, glucosamine, omega 3, othe over the counter meds on 6/20 /14   Do not wear jewelry, make-up or nail polish.  Do not wear lotions, powders, or perfumes. You may wear deodorant.  Do not shave 48 hours prior to surgery. Men may shave face and neck.  Do not bring valuables to the hospital.  [facility] is not responsible                   for any belongings or valuables.  Contacts, dentures or bridgework may not be worn into surgery.  Leave suitcase in the car. After surgery it may be brought to your room.  For patients admitted to the hospital, checkout time is 11:00 AM the day of  discharge.   Patients discharged the day of surgery will not be allowed to drive  home.  Name and phone number of your driver:  Special Instructions: Shower using CHG 2 nights before surgery and the night before surgery.  If you shower the day of surgery use CHG.  Use special wash - you have one bottle of CHG for all showers.  You should use approximately 1/3 of the bottle for each shower.   Please read over the following fact sheets that you were given: Pain Booklet, Coughing and Deep Breathing, MRSA Information and Surgical Site Infection Prevention

## 2013-04-26 DIAGNOSIS — F331 Major depressive disorder, recurrent, moderate: Secondary | ICD-10-CM | POA: Diagnosis not present

## 2013-05-02 ENCOUNTER — Encounter (HOSPITAL_COMMUNITY): Payer: Self-pay

## 2013-05-02 ENCOUNTER — Encounter (HOSPITAL_COMMUNITY)
Admission: RE | Admit: 2013-05-02 | Discharge: 2013-05-02 | Disposition: A | Discharge status: Home / Self Care | Payer: Medicare Other | Source: Ambulatory Visit | Attending: Surgery | Admitting: Surgery

## 2013-05-02 DIAGNOSIS — F3289 Other specified depressive episodes: Secondary | ICD-10-CM | POA: Diagnosis not present

## 2013-05-02 DIAGNOSIS — Z880 Allergy status to penicillin: Secondary | ICD-10-CM | POA: Diagnosis not present

## 2013-05-02 DIAGNOSIS — F411 Generalized anxiety disorder: Secondary | ICD-10-CM | POA: Diagnosis not present

## 2013-05-02 DIAGNOSIS — I10 Essential (primary) hypertension: Secondary | ICD-10-CM | POA: Diagnosis not present

## 2013-05-02 DIAGNOSIS — G4730 Sleep apnea, unspecified: Secondary | ICD-10-CM | POA: Diagnosis not present

## 2013-05-02 DIAGNOSIS — M129 Arthropathy, unspecified: Secondary | ICD-10-CM | POA: Diagnosis not present

## 2013-05-02 DIAGNOSIS — K4090 Unilateral inguinal hernia, without obstruction or gangrene, not specified as recurrent: Secondary | ICD-10-CM | POA: Diagnosis not present

## 2013-05-02 DIAGNOSIS — F329 Major depressive disorder, single episode, unspecified: Secondary | ICD-10-CM | POA: Diagnosis not present

## 2013-05-02 DIAGNOSIS — Z79899 Other long term (current) drug therapy: Secondary | ICD-10-CM | POA: Diagnosis not present

## 2013-05-02 DIAGNOSIS — Z96649 Presence of unspecified artificial hip joint: Secondary | ICD-10-CM | POA: Diagnosis not present

## 2013-05-02 HISTORY — DX: Allergy status to unspecified drugs, medicaments and biological substances: Z88.9

## 2013-05-02 HISTORY — DX: Personal history of other medical treatment: Z92.89

## 2013-05-02 HISTORY — DX: Constipation, unspecified: K59.00

## 2013-05-02 LAB — CBC
HCT: 37.6 % (ref 36.0–46.0)
Hemoglobin: 12.5 g/dL (ref 12.0–15.0)
MCH: 30 pg (ref 26.0–34.0)
MCHC: 33.2 g/dL (ref 30.0–36.0)

## 2013-05-02 LAB — SURGICAL PCR SCREEN: MRSA, PCR: NEGATIVE

## 2013-05-02 MED ORDER — CIPROFLOXACIN IN D5W 400 MG/200ML IV SOLN
400.0000 mg | INTRAVENOUS | Status: AC
  Administered 2013-05-03: 400 mg via INTRAVENOUS
  Filled 2013-05-02: qty 200

## 2013-05-02 NOTE — H&P (Signed)
Patient ID: Molly Lucas, female DOB: 1946-07-11, 67 y.o. MRN: [id number]  Chief Complaint   Patient presents with   .  Other     possible inguinal hernia   HPI  Molly Lucas is a 67 y.o. female.  HPI  This is a very pleasant female referred by Dr. Jarold Motto for evaluation of a right inguinal hernia. She reports it has been present for over a year. She is virtually asymptomatic. It may be getting slightly larger. She has had no objective symptoms. She is recovering from hip replacement surgery and is doing well  Past Medical History   Diagnosis  Date   .  Hypertension    .  Anxiety    .  Depression    .  Sleep apnea      cpap does not use, dentist made mouthipiece   .  Arthritis     Past Surgical History   Procedure  Laterality  Date   .  Wisdom tooth extraction     .  Uterine fibroid surgery     .  Total hip arthroplasty   06/24/2012     Procedure: TOTAL HIP ARTHROPLASTY ANTERIOR APPROACH; Surgeon: Kathryne Hitch, MD; Location: [facility]; Service: Orthopedics; Laterality: Left; Left total hip arthroplasty, anterior approach   No family history on file.  Social History  History   Substance Use Topics   .  Smoking status:  Never Smoker   .  Smokeless tobacco:  Never Used   .  Alcohol Use:  Yes      Comment: occasional glass of wine    Allergies   Allergen  Reactions   .  Penicillins  Other (See Comments)     Tongue swelling    Current Outpatient Prescriptions   Medication  Sig  Dispense  Refill   .  Cholecalciferol (VITAMIN D3) 5000 UNITS TABS  Take 10,000 mg by mouth every morning.     Marland Kitchen  CINNAMON PO  Take 4,000 mg by mouth every morning.     .  clorazepate (TRANXENE) 7.5 MG tablet  Take 11.25 mg by mouth at bedtime. Pt takes 1 and 1/2 tablet for a total dose of 11.25mg      .  DULoxetine (CYMBALTA) 60 MG capsule  Take 60 mg by mouth at bedtime.     .  ferrous sulfate 325 (65 FE) MG tablet  Take 1 tablet (325 mg total) by mouth 3 (three) times daily after meals.   20 tablet  0   .  fluticasone (FLONASE) 50 MCG/ACT nasal spray      .  gabapentin (NEURONTIN) 300 MG capsule  Take 300 mg by mouth 2 (two) times daily.     Marland Kitchen  L-Methylfolate-Algae (DEPLIN 15 PO)  Take 15 mg by mouth at bedtime.     .  lidocaine (XYLOCAINE) 2 % solution      .  losartan (COZAAR) 100 MG tablet  Take 100 mg by mouth every morning.     .  Omega-3 Fatty Acids (SUPER OMEGA 3 EPA/DHA PO)  Take 3 capsules by mouth every morning.     .  pravastatin (PRAVACHOL) 20 MG tablet  Take 20 mg by mouth daily.     Marland Kitchen  PRESCRIPTION MEDICATION  Take 2 tablets by mouth every morning. Pt takes a dietary supplement. Called hairgrow. ( vitamin E,Folic acid, folate. Biotin)      No current facility-administered medications for this visit.   Review of Systems  Review  of Systems  Constitutional: Negative for fever, chills and unexpected weight change.  HENT: Negative for hearing loss, congestion, sore throat, trouble swallowing and voice change.  Eyes: Negative for visual disturbance.  Respiratory: Negative for cough and wheezing.  Cardiovascular: Negative for chest pain, palpitations and leg swelling.  Gastrointestinal: Negative for nausea, vomiting, abdominal pain, diarrhea, constipation, blood in stool, abdominal distention and anal bleeding.  Genitourinary: Negative for hematuria, vaginal bleeding and difficulty urinating.  Musculoskeletal: Positive for joint swelling, arthralgias and gait problem.  Skin: Negative for rash and wound.  Neurological: Negative for seizures, syncope and headaches.  Hematological: Negative for adenopathy. Does not bruise/bleed easily.  Psychiatric/Behavioral: Negative for confusion.  Blood pressure 136/82, pulse 71, temperature 97.1 F (36.2 C), temperature source Temporal, resp. rate 16, height 5\' 2"  (1.575 m), weight 177 lb (80.287 kg).  Physical Exam  Physical Exam  Constitutional: She is oriented to person, place, and time. She appears well-developed and  well-nourished. No distress.  HENT:  Head: Normocephalic and atraumatic.  Right Ear: External ear normal.  Left Ear: External ear normal.  Mouth/Throat: No oropharyngeal exudate.  Eyes: Conjunctivae are normal. Pupils are equal, round, and reactive to light. Right eye exhibits no discharge. Left eye exhibits no discharge. No scleral icterus.  Neck: Normal range of motion. Neck supple. No tracheal deviation present. No thyromegaly present.  Cardiovascular: Normal rate, regular rhythm and normal heart sounds.  No murmur heard.  Pulmonary/Chest: Effort normal and breath sounds normal. No respiratory distress. She has no wheezes.  Abdominal: Soft. Bowel sounds are normal. She exhibits no distension. There is no tenderness.  There is a large, easily reducible right inguinal hernia. There is no evidence of left inguinal hernia  Lymphadenopathy:  She has no cervical adenopathy.  Neurological: She is alert and oriented to person, place, and time.  Skin: Skin is warm and dry. No rash noted. No erythema.  Psychiatric: Her behavior is normal.  Data Reviewed  Assessment  Right inguinal hernia  Plan  As this is quite large, repair with mesh was recommended. I discussed this with her in detail. I discussed the risks of surgery which includes but is not limited to bleeding, infection, nerve entrapment, chronic pain, recurrence. She understands and wishes to proceed. Surgery will be scheduled at her convenience.

## 2013-05-02 NOTE — Pre-Procedure Instructions (Addendum)
XITLALLY MOONEYHAM  05/02/2013   Your procedure is scheduled on:  05/03/13  Report to [facility] at 900 AM. Enter through the  Main Entrance, take the EAST ELEVATORS TO 3RD fLOOR- SHORT STAY.  Call this number if you have problems the morning of surgery: [phone]   Remember:   Do not eat food or drink liquids after midnight.   Take these medicines the morning of surgery with A SIP OF WATER:   gabapentin (NEURONTIN)                                 STOP  Aspirin, glucosamine, omega 3, othe over the counter meds on 6/20 /14   Do not wear jewelry, make-up or nail polish.  Do not wear lotions, powders, or perfumes.  Do not shave 48 hours prior to surgery.   Do not bring valuables to the hospital.  [facility] is not responsible  for any belongings or valuables.  Contacts, dentures or bridgework may not be worn into surgery.  Leave suitcase in the car. After surgery it may be brought to your room.  For patients admitted to the hospital, checkout time is 11:00 AM the day of discharge.   Patients discharged the day of surgery will not be allowed to drive home.  Name and phone number of your driver--  Special Instructions: Shower with CHG wash (Bactoshield) tonight and again in the am prior to arriving to hospital.   Please read over the following fact sheets that you were given: Pain Booklet, Coughing and Deep Breathing, MRSA Information and Surgical Site Infection Prevention

## 2013-05-03 ENCOUNTER — Ambulatory Visit (HOSPITAL_COMMUNITY): Payer: Medicare Other | Admitting: Anesthesiology

## 2013-05-03 ENCOUNTER — Ambulatory Visit (HOSPITAL_COMMUNITY)
Admission: RE | Admit: 2013-05-03 | Discharge: 2013-05-03 | Disposition: A | Discharge status: Home / Self Care | Payer: Medicare Other | Source: Ambulatory Visit | Attending: Surgery | Admitting: Surgery

## 2013-05-03 ENCOUNTER — Encounter (HOSPITAL_COMMUNITY): Payer: Self-pay | Admitting: Anesthesiology

## 2013-05-03 ENCOUNTER — Encounter (HOSPITAL_COMMUNITY)
Admission: RE | Disposition: A | Discharge status: Home / Self Care | Payer: Self-pay | Source: Ambulatory Visit | Attending: Surgery

## 2013-05-03 ENCOUNTER — Encounter (HOSPITAL_COMMUNITY): Payer: Self-pay | Admitting: *Deleted

## 2013-05-03 DIAGNOSIS — Z96649 Presence of unspecified artificial hip joint: Secondary | ICD-10-CM | POA: Insufficient documentation

## 2013-05-03 DIAGNOSIS — K4090 Unilateral inguinal hernia, without obstruction or gangrene, not specified as recurrent: Secondary | ICD-10-CM | POA: Insufficient documentation

## 2013-05-03 DIAGNOSIS — I10 Essential (primary) hypertension: Secondary | ICD-10-CM | POA: Insufficient documentation

## 2013-05-03 DIAGNOSIS — M129 Arthropathy, unspecified: Secondary | ICD-10-CM | POA: Insufficient documentation

## 2013-05-03 DIAGNOSIS — Z79899 Other long term (current) drug therapy: Secondary | ICD-10-CM | POA: Insufficient documentation

## 2013-05-03 DIAGNOSIS — F3289 Other specified depressive episodes: Secondary | ICD-10-CM | POA: Diagnosis not present

## 2013-05-03 DIAGNOSIS — F329 Major depressive disorder, single episode, unspecified: Secondary | ICD-10-CM | POA: Insufficient documentation

## 2013-05-03 DIAGNOSIS — G4730 Sleep apnea, unspecified: Secondary | ICD-10-CM | POA: Insufficient documentation

## 2013-05-03 DIAGNOSIS — F411 Generalized anxiety disorder: Secondary | ICD-10-CM | POA: Diagnosis not present

## 2013-05-03 DIAGNOSIS — Z880 Allergy status to penicillin: Secondary | ICD-10-CM | POA: Insufficient documentation

## 2013-05-03 HISTORY — PX: INSERTION OF MESH: SHX5868

## 2013-05-03 HISTORY — PX: INGUINAL HERNIA REPAIR: SHX194

## 2013-05-03 SURGERY — REPAIR, HERNIA, INGUINAL, ADULT
Anesthesia: General | Site: Groin | Laterality: Right | Wound class: Clean

## 2013-05-03 MED ORDER — 0.9 % SODIUM CHLORIDE (POUR BTL) OPTIME
TOPICAL | Status: DC | PRN
  Administered 2013-05-03: 1000 mL

## 2013-05-03 MED ORDER — MORPHINE SULFATE 2 MG/ML IJ SOLN
1.0000 mg | INTRAMUSCULAR | Status: DC | PRN
  Administered 2013-05-03: 2 mg via INTRAVENOUS

## 2013-05-03 MED ORDER — BUPIVACAINE-EPINEPHRINE (PF) 0.5% -1:200000 IJ SOLN
INTRAMUSCULAR | Status: AC
  Filled 2013-05-03: qty 10

## 2013-05-03 MED ORDER — LACTATED RINGERS IV SOLN
INTRAVENOUS | Status: DC | PRN
  Administered 2013-05-03: 11:00:00 via INTRAVENOUS

## 2013-05-03 MED ORDER — PHENYLEPHRINE HCL 10 MG/ML IJ SOLN
INTRAMUSCULAR | Status: DC | PRN
  Administered 2013-05-03: 80 ug via INTRAVENOUS

## 2013-05-03 MED ORDER — FENTANYL CITRATE 0.05 MG/ML IJ SOLN
INTRAMUSCULAR | Status: DC | PRN
  Administered 2013-05-03: 50 ug via INTRAVENOUS

## 2013-05-03 MED ORDER — MIDAZOLAM HCL 5 MG/5ML IJ SOLN
INTRAMUSCULAR | Status: DC | PRN
  Administered 2013-05-03: 2 mg via INTRAVENOUS

## 2013-05-03 MED ORDER — HYDROCODONE-ACETAMINOPHEN 5-325 MG PO TABS: 1.0000 | ORAL_TABLET | Freq: Four times a day (QID) | ORAL | Status: DC | PRN

## 2013-05-03 MED ORDER — LIDOCAINE HCL (CARDIAC) 20 MG/ML IV SOLN
INTRAVENOUS | Status: DC | PRN
  Administered 2013-05-03: 80 mg via INTRAVENOUS

## 2013-05-03 MED ORDER — LACTATED RINGERS IV SOLN
INTRAVENOUS | Status: DC
  Administered 2013-05-03: 11:00:00 via INTRAVENOUS

## 2013-05-03 MED ORDER — MORPHINE SULFATE 2 MG/ML IJ SOLN
INTRAMUSCULAR | Status: AC
  Filled 2013-05-03: qty 1

## 2013-05-03 MED ORDER — OXYCODONE HCL 5 MG/5ML PO SOLN: 5.0000 mg | Freq: Once | ORAL | Status: DC | PRN

## 2013-05-03 MED ORDER — PROPOFOL 10 MG/ML IV BOLUS
INTRAVENOUS | Status: DC | PRN
  Administered 2013-05-03: 170 mg via INTRAVENOUS

## 2013-05-03 MED ORDER — PROMETHAZINE HCL 25 MG/ML IJ SOLN: 6.2500 mg | INTRAMUSCULAR | Status: DC | PRN

## 2013-05-03 MED ORDER — OXYCODONE HCL 5 MG PO TABS: 5.0000 mg | ORAL_TABLET | Freq: Once | ORAL | Status: DC | PRN

## 2013-05-03 MED ORDER — BUPIVACAINE-EPINEPHRINE 0.5% -1:200000 IJ SOLN
INTRAMUSCULAR | Status: DC | PRN
  Administered 2013-05-03: 20 mL

## 2013-05-03 MED ORDER — ONDANSETRON HCL 4 MG/2ML IJ SOLN
INTRAMUSCULAR | Status: DC | PRN
  Administered 2013-05-03: 4 mg via INTRAVENOUS

## 2013-05-03 SURGICAL SUPPLY — 44 items
BENZOIN TINCTURE PRP APPL 2/3 (GAUZE/BANDAGES/DRESSINGS) ×2 IMPLANT
BLADE SURG 10 STRL SS (BLADE) ×2 IMPLANT
BLADE SURG 15 STRL LF DISP TIS (BLADE) ×1 IMPLANT
BLADE SURG 15 STRL SS (BLADE) ×1
BLADE SURG ROTATE 9660 (MISCELLANEOUS) IMPLANT
CHLORAPREP W/TINT 26ML (MISCELLANEOUS) ×2 IMPLANT
CLOTH BEACON ORANGE TIMEOUT ST (SAFETY) ×2 IMPLANT
COVER SURGICAL LIGHT HANDLE (MISCELLANEOUS) ×2 IMPLANT
DRAIN PENROSE 1/2X12 LTX STRL (WOUND CARE) IMPLANT
DRAPE LAPAROTOMY TRNSV 102X78 (DRAPE) ×2 IMPLANT
DRESSING TELFA 8X3 (GAUZE/BANDAGES/DRESSINGS) ×2 IMPLANT
DRSG TEGADERM 4X4.75 (GAUZE/BANDAGES/DRESSINGS) ×2 IMPLANT
ELECT CAUTERY BLADE 6.4 (BLADE) ×2 IMPLANT
ELECT REM PT RETURN 9FT ADLT (ELECTROSURGICAL) ×2
ELECTRODE REM PT RTRN 9FT ADLT (ELECTROSURGICAL) ×1 IMPLANT
GLOVE BIO SURGEON STRL SZ7 (GLOVE) ×2 IMPLANT
GLOVE BIO SURGEON STRL SZ7.5 (GLOVE) ×2 IMPLANT
GLOVE BIOGEL PI IND STRL 7.5 (GLOVE) ×1 IMPLANT
GLOVE BIOGEL PI INDICATOR 7.5 (GLOVE) ×1
GLOVE SURG SIGNA 7.5 PF LTX (GLOVE) ×2 IMPLANT
GLOVE SURG SS PI 7.0 STRL IVOR (GLOVE) ×2 IMPLANT
GOWN STRL NON-REIN LRG LVL3 (GOWN DISPOSABLE) ×2 IMPLANT
GOWN STRL REIN XL XLG (GOWN DISPOSABLE) ×2 IMPLANT
KIT BASIN OR (CUSTOM PROCEDURE TRAY) ×2 IMPLANT
KIT ROOM TURNOVER OR (KITS) ×2 IMPLANT
MESH PARIETEX PROGRIP RIGHT (Mesh General) ×2 IMPLANT
NEEDLE HYPO 25GX1X1/2 BEV (NEEDLE) ×2 IMPLANT
NS IRRIG 1000ML POUR BTL (IV SOLUTION) ×2 IMPLANT
PACK SURGICAL SETUP 50X90 (CUSTOM PROCEDURE TRAY) ×2 IMPLANT
PAD ARMBOARD 7.5X6 YLW CONV (MISCELLANEOUS) ×4 IMPLANT
PENCIL BUTTON HOLSTER BLD 10FT (ELECTRODE) ×2 IMPLANT
SPECIMEN JAR SMALL (MISCELLANEOUS) IMPLANT
SPONGE GAUZE 4X4 12PLY (GAUZE/BANDAGES/DRESSINGS) ×2 IMPLANT
SPONGE LAP 18X18 X RAY DECT (DISPOSABLE) ×2 IMPLANT
STRIP CLOSURE SKIN 1/2X4 (GAUZE/BANDAGES/DRESSINGS) ×2 IMPLANT
SUT MON AB 4-0 PC3 18 (SUTURE) ×2 IMPLANT
SUT SILK 2 0 SH (SUTURE) IMPLANT
SUT VIC AB 2-0 CT1 27 (SUTURE) ×2
SUT VIC AB 2-0 CT1 TAPERPNT 27 (SUTURE) ×2 IMPLANT
SUT VIC AB 3-0 CT1 27 (SUTURE) ×1
SUT VIC AB 3-0 CT1 TAPERPNT 27 (SUTURE) ×1 IMPLANT
SYR CONTROL 10ML LL (SYRINGE) ×2 IMPLANT
TOWEL OR 17X24 6PK STRL BLUE (TOWEL DISPOSABLE) ×2 IMPLANT
TOWEL OR 17X26 10 PK STRL BLUE (TOWEL DISPOSABLE) ×2 IMPLANT

## 2013-05-03 NOTE — Anesthesia Preprocedure Evaluation (Addendum)
Anesthesia Evaluation  Patient identified by MRN, date of birth, ID band Patient awake    History of Anesthesia Complications Negative for: history of anesthetic complications  Airway Mallampati: II TM Distance: >3 FB Neck ROM: Full    Dental  (+) Dental Advisory Given and Teeth Intact   Pulmonary sleep apnea (wears mouthpiece) ,  breath sounds clear to auscultation        Cardiovascular hypertension, Rhythm:Regular Rate:Normal     Neuro/Psych Anxiety Depression    GI/Hepatic negative GI ROS, Neg liver ROS,   Endo/Other  negative endocrine ROS  Renal/GU negative Renal ROS     Musculoskeletal  (+) Arthritis -,   Abdominal   Peds  Hematology   Anesthesia Other Findings   Reproductive/Obstetrics                          Anesthesia Physical Anesthesia Plan  ASA: II  Anesthesia Plan: General   Post-op Pain Management:    Induction: Intravenous  Airway Management Planned: Oral ETT  Additional Equipment:   Intra-op Plan:   Post-operative Plan: Extubation in OR  Informed Consent: I have reviewed the patients History and Physical, chart, labs and discussed the procedure including the risks, benefits and alternatives for the proposed anesthesia with the patient or authorized representative who has indicated his/her understanding and acceptance.   Dental advisory given  Plan Discussed with: CRNA and Surgeon  Anesthesia Plan Comments:         Anesthesia Quick Evaluation

## 2013-05-03 NOTE — Anesthesia Postprocedure Evaluation (Signed)
  Anesthesia Post-op Note  Patient: Molly Lucas  Procedure(s) Performed: Procedure(s): HERNIA REPAIR INGUINAL ADULT (Right) INSERTION OF MESH (Right)  Patient Location: PACU  Anesthesia Type:General  Level of Consciousness: awake  Airway and Oxygen Therapy: Patient Spontanous Breathing  Post-op Pain: mild  Post-op Assessment: Post-op Vital signs reviewed  Post-op Vital Signs: stable  Complications: No apparent anesthesia complications

## 2013-05-03 NOTE — Transfer of Care (Signed)
Immediate Anesthesia Transfer of Care Note  Patient: Molly Lucas  Procedure(s) Performed: Procedure(s): HERNIA REPAIR INGUINAL ADULT (Right) INSERTION OF MESH (Right)  Patient Location: PACU  Anesthesia Type:General  Level of Consciousness: awake, alert  and oriented  Airway & Oxygen Therapy: Patient Spontanous Breathing and Patient connected to nasal cannula oxygen  Post-op Assessment: Report given to PACU RN and Post -op Vital signs reviewed and stable  Post vital signs: Reviewed and stable  Complications: No apparent anesthesia complications

## 2013-05-03 NOTE — Op Note (Signed)
HERNIA REPAIR INGUINAL ADULT, INSERTION OF MESH  Procedure Note  Molly Lucas 05/03/2013   Pre-op Diagnosis: right inguinal hernia     Post-op Diagnosis: same  Procedure(s): HERNIA REPAIR INGUINAL ADULT INSERTION OF MESH  Surgeon(s): Shelly Rubenstein, MD  Anesthesia: General  Staff:  Circulator: Sudie Bailey, RN; Doy Mince, RN Scrub Person: Gerre Pebbles Sipsis, RN; Janeece Agee Pingue, CST  Estimated Blood Loss: Minimal               Specimens:           Molly Lucas   Date: 05/03/2013  Time: 12:13 PM

## 2013-05-03 NOTE — Interval H&P Note (Signed)
History and Physical Interval Note: no change in H and P  05/03/2013 10:50 AM  Molly Lucas  has presented today for surgery, with the diagnosis of hernia  The various methods of treatment have been discussed with the patient and family. After consideration of risks, benefits and other options for treatment, the patient has consented to  Procedure(s): HERNIA REPAIR INGUINAL ADULT (Right) INSERTION OF MESH (Right) as a surgical intervention .  The patient's history has been reviewed, patient examined, no change in status, stable for surgery.  I have reviewed the patient's chart and labs.  Questions were answered to the patient's satisfaction.     Linda Grimmer A

## 2013-05-03 NOTE — Preoperative (Signed)
Beta Blockers   Reason not to administer Beta Blockers:Not Applicable 

## 2013-05-04 ENCOUNTER — Telehealth (INDEPENDENT_AMBULATORY_CARE_PROVIDER_SITE_OTHER): Payer: Self-pay | Admitting: General Surgery

## 2013-05-04 NOTE — Op Note (Signed)
Molly Lucas, Molly Lucas                ACCOUNT NO.:  [id number]  MEDICAL RECORD NO.:  [id number]  LOCATION:  [facility]                         FACILITY:  [facility]  PHYSICIAN:  Abigail Miyamoto, M.D. DATE OF BIRTH:  [birth year]  DATE OF PROCEDURE:  05/03/2013 DATE OF DISCHARGE:  05/03/2013                              OPERATIVE REPORT   PREOPERATIVE DIAGNOSIS:  Right inguinal hernia.  POSTOPERATIVE DIAGNOSIS:  Right inguinal hernia.  PROCEDURE:  Right inguinal hernia repair with mesh.  SURGEON:  Abigail Miyamoto, M.D.  ANESTHESIA:  General and 0.5% Marcaine.  ESTIMATED BLOOD LOSS:  Minimal.  PROCEDURE IN DETAIL:  The patient was brought to the operating room and identified as Molly Lucas.  She was placed supine on the operating room table and general anesthesia was induced.  Her abdomen was then prepped and draped in usual sterile fashion.  I performed a right ilioinguinal nerve block with Marcaine.  I then anesthetized the skin further with Marcaine.  I made a longitudinal incision with the scalpel.  I took this down through the Scarpa fascia with electrocautery.  The external oblique fascia was identified and opened towards the internal and external rings.  The patient had a large direct hernia defect.  I was able to reduce the sac completely and then imbricated the overlying tissue with interrupted 2-0 Vicryl sutures.  I then brought a piece of ProGrip Prolene mesh onto the field.  I placed this with an onlay on the right inguinal floor and placed it underneath the external oblique fascia.  I then sewed it in place circumferentially with interrupted 2-0 Vicryl sutures.  I then closed the external oblique fascia over top of this with running 2-0 Vicryl suture.  I anesthetized the fascia further with Marcaine.  I closed the Scarpa fascia with interrupted 3-0 Vicryl sutures and closed the skin with running 4-0 Monocryl.  Steri-Strips, gauze, and Tegaderm were then applied.  The patient  tolerated the procedure well.  All the counts were correct at the end of the procedure.  The patient was then extubated in the operating room and taken in a stable condition to the recovery room.     Abigail Miyamoto, M.D.     DB/MEDQ  D:  05/03/2013  T:  05/04/2013  Job:  [id number]

## 2013-05-04 NOTE — Telephone Encounter (Signed)
[facility] making patient aware of post op appt date/time. Patient to call back if she needs to r/s.

## 2013-05-05 ENCOUNTER — Encounter (HOSPITAL_COMMUNITY): Payer: Self-pay | Admitting: Surgery

## 2013-05-10 DIAGNOSIS — F331 Major depressive disorder, recurrent, moderate: Secondary | ICD-10-CM | POA: Diagnosis not present

## 2013-05-15 ENCOUNTER — Encounter (INDEPENDENT_AMBULATORY_CARE_PROVIDER_SITE_OTHER): Payer: Self-pay | Admitting: Surgery

## 2013-05-15 ENCOUNTER — Ambulatory Visit (INDEPENDENT_AMBULATORY_CARE_PROVIDER_SITE_OTHER): Payer: Medicare Other | Admitting: Surgery

## 2013-05-15 VITALS — BP 144/78 | HR 70 | Resp 18 | Ht 63.0 in | Wt 190.0 lb

## 2013-05-15 DIAGNOSIS — Z09 Encounter for follow-up examination after completed treatment for conditions other than malignant neoplasm: Secondary | ICD-10-CM

## 2013-05-15 DIAGNOSIS — K4090 Unilateral inguinal hernia, without obstruction or gangrene, not specified as recurrent: Secondary | ICD-10-CM

## 2013-05-15 NOTE — Progress Notes (Signed)
Subjective:     Patient ID: Molly Lucas, female   DOB: 06/28/1946, 67 y.o.   MRN: [id number]  HPI She is here for her first postoperative visit status post right inguinal hernia repair with mesh. She is doing well and has minimal complaints  Review of Systems     Objective:   Physical Exam On exam, her incision is well healed with minimal swelling    Assessment:     Patient stable postop     Plan:     She will refrain from heavy lifting for 3 more weeks. She may then resume normal activity. I will see her back as needed

## 2013-05-19 DIAGNOSIS — F331 Major depressive disorder, recurrent, moderate: Secondary | ICD-10-CM | POA: Diagnosis not present

## 2013-05-29 DIAGNOSIS — E785 Hyperlipidemia, unspecified: Secondary | ICD-10-CM | POA: Diagnosis not present

## 2013-05-29 DIAGNOSIS — I10 Essential (primary) hypertension: Secondary | ICD-10-CM | POA: Diagnosis not present

## 2013-05-30 DIAGNOSIS — R030 Elevated blood-pressure reading, without diagnosis of hypertension: Secondary | ICD-10-CM | POA: Diagnosis not present

## 2013-06-01 DIAGNOSIS — R030 Elevated blood-pressure reading, without diagnosis of hypertension: Secondary | ICD-10-CM | POA: Diagnosis not present

## 2013-06-07 ENCOUNTER — Other Ambulatory Visit: Payer: Self-pay | Admitting: Geriatric Medicine

## 2013-06-13 DIAGNOSIS — F331 Major depressive disorder, recurrent, moderate: Secondary | ICD-10-CM | POA: Diagnosis not present

## 2013-06-16 DIAGNOSIS — G4733 Obstructive sleep apnea (adult) (pediatric): Secondary | ICD-10-CM | POA: Diagnosis not present

## 2013-06-16 DIAGNOSIS — F329 Major depressive disorder, single episode, unspecified: Secondary | ICD-10-CM | POA: Diagnosis not present

## 2013-06-16 DIAGNOSIS — E785 Hyperlipidemia, unspecified: Secondary | ICD-10-CM | POA: Diagnosis not present

## 2013-06-16 DIAGNOSIS — F3289 Other specified depressive episodes: Secondary | ICD-10-CM | POA: Diagnosis not present

## 2013-06-16 DIAGNOSIS — D649 Anemia, unspecified: Secondary | ICD-10-CM | POA: Diagnosis not present

## 2013-06-16 DIAGNOSIS — Z79899 Other long term (current) drug therapy: Secondary | ICD-10-CM | POA: Diagnosis not present

## 2013-06-16 DIAGNOSIS — Z0000 Encounter for general adult medical examination without abnormal findings: Secondary | ICD-10-CM | POA: Diagnosis not present

## 2013-06-16 DIAGNOSIS — M545 Low back pain, unspecified: Secondary | ICD-10-CM | POA: Diagnosis not present

## 2013-06-16 DIAGNOSIS — Z1331 Encounter for screening for depression: Secondary | ICD-10-CM | POA: Diagnosis not present

## 2013-06-16 DIAGNOSIS — Z1212 Encounter for screening for malignant neoplasm of rectum: Secondary | ICD-10-CM | POA: Diagnosis not present

## 2013-06-16 DIAGNOSIS — N318 Other neuromuscular dysfunction of bladder: Secondary | ICD-10-CM | POA: Diagnosis not present

## 2013-06-16 DIAGNOSIS — Z23 Encounter for immunization: Secondary | ICD-10-CM | POA: Diagnosis not present

## 2013-06-21 DIAGNOSIS — F331 Major depressive disorder, recurrent, moderate: Secondary | ICD-10-CM | POA: Diagnosis not present

## 2013-07-12 DIAGNOSIS — Z01419 Encounter for gynecological examination (general) (routine) without abnormal findings: Secondary | ICD-10-CM | POA: Diagnosis not present

## 2013-07-12 DIAGNOSIS — F331 Major depressive disorder, recurrent, moderate: Secondary | ICD-10-CM | POA: Diagnosis not present

## 2013-07-12 DIAGNOSIS — Z124 Encounter for screening for malignant neoplasm of cervix: Secondary | ICD-10-CM | POA: Diagnosis not present

## 2013-07-17 DIAGNOSIS — M1610 Unilateral primary osteoarthritis, unspecified hip: Secondary | ICD-10-CM | POA: Diagnosis not present

## 2013-07-17 DIAGNOSIS — M25559 Pain in unspecified hip: Secondary | ICD-10-CM | POA: Diagnosis not present

## 2013-07-17 DIAGNOSIS — M169 Osteoarthritis of hip, unspecified: Secondary | ICD-10-CM | POA: Diagnosis not present

## 2013-07-19 ENCOUNTER — Other Ambulatory Visit: Payer: Self-pay

## 2013-07-19 DIAGNOSIS — Z1231 Encounter for screening mammogram for malignant neoplasm of breast: Secondary | ICD-10-CM

## 2013-07-31 DIAGNOSIS — F331 Major depressive disorder, recurrent, moderate: Secondary | ICD-10-CM | POA: Diagnosis not present

## 2013-08-02 DIAGNOSIS — H0019 Chalazion unspecified eye, unspecified eyelid: Secondary | ICD-10-CM | POA: Diagnosis not present

## 2013-08-07 DIAGNOSIS — F331 Major depressive disorder, recurrent, moderate: Secondary | ICD-10-CM | POA: Diagnosis not present

## 2013-08-08 ENCOUNTER — Ambulatory Visit
Admission: RE | Admit: 2013-08-08 | Discharge: 2013-08-08 | Disposition: A | Discharge status: Home / Self Care | Payer: Medicare Other | Source: Ambulatory Visit

## 2013-08-08 DIAGNOSIS — Z1231 Encounter for screening mammogram for malignant neoplasm of breast: Secondary | ICD-10-CM

## 2013-08-15 ENCOUNTER — Ambulatory Visit (INDEPENDENT_AMBULATORY_CARE_PROVIDER_SITE_OTHER): Payer: Medicare Other | Admitting: Surgery

## 2013-08-15 ENCOUNTER — Encounter (INDEPENDENT_AMBULATORY_CARE_PROVIDER_SITE_OTHER): Payer: Self-pay | Admitting: Surgery

## 2013-08-15 VITALS — BP 138/88 | HR 76 | Temp 98.3°F | Resp 14 | Ht 63.0 in | Wt 187.6 lb

## 2013-08-15 DIAGNOSIS — Z09 Encounter for follow-up examination after completed treatment for conditions other than malignant neoplasm: Secondary | ICD-10-CM

## 2013-08-15 DIAGNOSIS — K4091 Unilateral inguinal hernia, without obstruction or gangrene, recurrent: Secondary | ICD-10-CM | POA: Insufficient documentation

## 2013-08-15 NOTE — Progress Notes (Signed)
Subjective:     Patient ID: Molly Lucas, female   DOB: 05-Apr-1946, 67 y.o.   MRN: [id number]  HPI She is status post a right inguinal hernia repair with mesh approximately 3 months ago. She recently noticed a bulge in her right groin. She's had some mild discomfort  Review of Systems     Objective:   Physical Exam On exam, she has an easily reducible recurrent right inguinal hernia    Assessment:     Recurrent right renal hernia     Plan:     Unfortunately, she needs repair of this. I'm going to do this laparoscopic with mesh. I discussed the risks with her briefly. Surgery will be scheduled

## 2013-08-21 ENCOUNTER — Encounter (HOSPITAL_COMMUNITY): Payer: Self-pay | Admitting: Pharmacy Technician

## 2013-08-21 DIAGNOSIS — F331 Major depressive disorder, recurrent, moderate: Secondary | ICD-10-CM | POA: Diagnosis not present

## 2013-08-22 ENCOUNTER — Encounter (HOSPITAL_COMMUNITY)
Admission: RE | Admit: 2013-08-22 | Discharge: 2013-08-22 | Disposition: A | Discharge status: Home / Self Care | Payer: Medicare Other | Source: Ambulatory Visit | Attending: Surgery | Admitting: Surgery

## 2013-08-22 ENCOUNTER — Encounter (HOSPITAL_COMMUNITY): Payer: Self-pay

## 2013-08-22 DIAGNOSIS — I10 Essential (primary) hypertension: Secondary | ICD-10-CM | POA: Diagnosis not present

## 2013-08-22 DIAGNOSIS — Z01812 Encounter for preprocedural laboratory examination: Secondary | ICD-10-CM | POA: Diagnosis not present

## 2013-08-22 DIAGNOSIS — F3289 Other specified depressive episodes: Secondary | ICD-10-CM | POA: Diagnosis not present

## 2013-08-22 DIAGNOSIS — Z01810 Encounter for preprocedural cardiovascular examination: Secondary | ICD-10-CM | POA: Diagnosis not present

## 2013-08-22 DIAGNOSIS — F411 Generalized anxiety disorder: Secondary | ICD-10-CM | POA: Diagnosis not present

## 2013-08-22 DIAGNOSIS — K4190 Unilateral femoral hernia, without obstruction or gangrene, not specified as recurrent: Secondary | ICD-10-CM | POA: Diagnosis not present

## 2013-08-22 DIAGNOSIS — M129 Arthropathy, unspecified: Secondary | ICD-10-CM | POA: Diagnosis not present

## 2013-08-22 DIAGNOSIS — F329 Major depressive disorder, single episode, unspecified: Secondary | ICD-10-CM | POA: Diagnosis not present

## 2013-08-22 DIAGNOSIS — G4730 Sleep apnea, unspecified: Secondary | ICD-10-CM | POA: Diagnosis not present

## 2013-08-22 HISTORY — DX: Anemia, unspecified: D64.9

## 2013-08-22 HISTORY — DX: Hyperlipidemia, unspecified: E78.5

## 2013-08-22 LAB — BASIC METABOLIC PANEL
BUN: 12 mg/dL (ref 6–23)
CO2: 29 mEq/L (ref 19–32)
Calcium: 9.8 mg/dL (ref 8.4–10.5)
GFR calc non Af Amer: 87 mL/min — ABNORMAL LOW (ref >90)
Glucose, Bld: 102 mg/dL — ABNORMAL HIGH (ref 70–99)
Potassium: 4.2 mEq/L (ref 3.5–5.1)
Sodium: 139 mEq/L (ref 135–145)

## 2013-08-22 LAB — CBC
HCT: 39.2 % (ref 36.0–46.0)
Hemoglobin: 12.9 g/dL (ref 12.0–15.0)
MCH: 28.3 pg (ref 26.0–34.0)
MCHC: 32.9 g/dL (ref 30.0–36.0)
RBC: 4.56 MIL/uL (ref 3.87–5.11)

## 2013-08-22 MED ORDER — CIPROFLOXACIN IN D5W 400 MG/200ML IV SOLN
400.0000 mg | INTRAVENOUS | Status: AC
  Administered 2013-08-23: 400 mg via INTRAVENOUS
  Filled 2013-08-22: qty 200

## 2013-08-22 NOTE — Pre-Procedure Instructions (Signed)
Molly Lucas  08/22/2013   Your procedure is scheduled on:  Wednesday August 23, 2013.  Report to [facility] Short Stay Entrance "A" at 12:10 PM.  Call this number if you have problems the morning of surgery: [phone]   Remember:   Do not eat food or drink liquids after midnight.   Take these medicines the morning of surgery with A SIP OF WATER: Gabapentin (Neurontin)  Discontinue aspirin, and herbal medications 5 days prior to surgery   Do not wear jewelry, make-up or nail polish.  Do not wear lotions, powders, or perfumes. You may wear deodorant.  Do not shave 48 hours prior to surgery.   Do not bring valuables to the hospital.  [facility] is not responsible for any belongings or valuables.               Contacts, dentures or bridgework may not be worn into surgery.  Leave suitcase in the car. After surgery it may be brought to your room.  For patients admitted to the hospital, discharge time is determined by your treatment team.               Patients discharged the day of surgery will not be allowed to drive home.  Name and phone number of your driver: Family/Friend  Special Instructions: Shower using CHG 2 nights before surgery and the night before surgery.  If you shower the day of surgery use CHG.  Use special wash - you have one bottle of CHG for all showers.  You should use approximately 1/3 of the bottle for each shower.   Please read over the following fact sheets that you were given: Pain Booklet, Coughing and Deep Breathing and Surgical Site Infection Prevention

## 2013-08-22 NOTE — H&P (Signed)
Molly Lucas is an 67 y.o. female.   Chief Complaint: Recurrent right inguinal hernia HPI: This is a pleasant female who is approximately 3 months status post open right inguinal hernia with mesh. Unfortunately, she has developed a recurrence. She is asymptomatic except for noticing a large bulge. She now presents for laparoscopic repair. She is otherwise without complaints  Past Medical History  Diagnosis Date  . Anxiety   . Depression   . Sleep apnea     cpap does not use, dentist made mouthipiece   . Arthritis   . Hypertension     hx of off medication now.  Marland Kitchen Hx of seasonal allergies   . Constipation   . History of blood transfusion     Past Surgical History  Procedure Laterality Date  . Wisdom tooth extraction    . Uterine fibroid surgery    . Total hip arthroplasty  06/24/2012    Procedure: TOTAL HIP ARTHROPLASTY ANTERIOR APPROACH;  Surgeon: Molly Hitch, MD;  Location: [facility];  Service: Orthopedics;  Laterality: Left;  Left total hip arthroplasty, anterior approach  . Hernia repair Left 06/2012  . Inguinal hernia repair Right 05/03/2013    Procedure: HERNIA REPAIR INGUINAL ADULT;  Surgeon: Molly Rubenstein, MD;  Location: [facility] OR;  Service: General;  Laterality: Right;  . Insertion of mesh Right 05/03/2013    Procedure: INSERTION OF MESH;  Surgeon: Molly Rubenstein, MD;  Location: [facility] OR;  Service: General;  Laterality: Right;    No family history on file. Social History:  reports that she has never smoked. She has never used smokeless tobacco. She reports that she drinks about 1.2 ounces of alcohol per week. She reports that she does not use illicit drugs.  Allergies:  Allergies  Allergen Reactions  . Penicillins Other (See Comments)    Tongue swelling     No prescriptions prior to admission    No results found for this or any previous visit (from the past 48 hour(s)). No results found.  Review of Systems  All other systems reviewed and are  negative.    There were no vitals taken for this visit. Physical Exam  Constitutional: She is oriented to person, place, and time. She appears well-developed and well-nourished. No distress.  HENT:  Head: Normocephalic and atraumatic.  Eyes: Conjunctivae are normal. Pupils are equal, round, and reactive to light.  Neck: Normal range of motion. Neck supple. No tracheal deviation present.  Cardiovascular: Normal rate, regular rhythm, normal heart sounds and intact distal pulses.   No murmur heard. Respiratory: Effort normal and breath sounds normal. No respiratory distress. She has no wheezes.  GI: Soft. Bowel sounds are normal. She exhibits no distension. There is no tenderness.  Moderate size, easily reducible right inguinal hernia  Musculoskeletal: Normal range of motion.  Neurological: She is alert and oriented to person, place, and time.  Skin: Skin is warm and dry.  Psychiatric: Her behavior is normal. Judgment normal.     Assessment/Plan Recurrent right inguinal hernia  Unfortunately, this does need repair as it is moderate in size. I discussed laparoscopic approach with mesh with her. This will require general anesthesia.  I discussed the risks of surgery which includes but is not limited to bleeding, infection, injury to stranding structures, nerve entrapment, chronic pain, and recurrence. She understands and wishes to proceed. Surgery is scheduled  Molly Lucas A 08/22/2013, 9:35 AM

## 2013-08-22 NOTE — Progress Notes (Signed)
PCP is Dr. Ivery Quale in [place]. Patient denied having any hypertension and stated "my doctor took me off of blood pressure medication after I had my hip replacement last year. My blood pressure was to low so I was taken off of it." Patient denied having a stress test or cardiac cath, but informed Nurse that she has sleep apnea but denied wearing a CPAP machine but informed Nurse that she wears a mouth guard nightly.

## 2013-08-23 ENCOUNTER — Ambulatory Visit (HOSPITAL_COMMUNITY)
Admission: RE | Admit: 2013-08-23 | Discharge: 2013-08-23 | Disposition: A | Discharge status: Home / Self Care | Payer: Medicare Other | Source: Ambulatory Visit | Attending: Surgery | Admitting: Surgery

## 2013-08-23 ENCOUNTER — Encounter (HOSPITAL_COMMUNITY)
Admission: RE | Disposition: A | Discharge status: Home / Self Care | Payer: Self-pay | Source: Ambulatory Visit | Attending: Surgery

## 2013-08-23 ENCOUNTER — Ambulatory Visit (HOSPITAL_COMMUNITY): Payer: Medicare Other | Admitting: Certified Registered"

## 2013-08-23 ENCOUNTER — Encounter (HOSPITAL_COMMUNITY): Payer: Self-pay | Admitting: *Deleted

## 2013-08-23 ENCOUNTER — Encounter (HOSPITAL_COMMUNITY): Payer: Medicare Other | Admitting: Certified Registered"

## 2013-08-23 DIAGNOSIS — F3289 Other specified depressive episodes: Secondary | ICD-10-CM | POA: Insufficient documentation

## 2013-08-23 DIAGNOSIS — Z01812 Encounter for preprocedural laboratory examination: Secondary | ICD-10-CM | POA: Insufficient documentation

## 2013-08-23 DIAGNOSIS — K4090 Unilateral inguinal hernia, without obstruction or gangrene, not specified as recurrent: Secondary | ICD-10-CM | POA: Diagnosis not present

## 2013-08-23 DIAGNOSIS — I10 Essential (primary) hypertension: Secondary | ICD-10-CM | POA: Diagnosis not present

## 2013-08-23 DIAGNOSIS — G4730 Sleep apnea, unspecified: Secondary | ICD-10-CM | POA: Diagnosis not present

## 2013-08-23 DIAGNOSIS — F329 Major depressive disorder, single episode, unspecified: Secondary | ICD-10-CM | POA: Insufficient documentation

## 2013-08-23 DIAGNOSIS — F411 Generalized anxiety disorder: Secondary | ICD-10-CM | POA: Insufficient documentation

## 2013-08-23 DIAGNOSIS — K4190 Unilateral femoral hernia, without obstruction or gangrene, not specified as recurrent: Secondary | ICD-10-CM | POA: Diagnosis not present

## 2013-08-23 DIAGNOSIS — M129 Arthropathy, unspecified: Secondary | ICD-10-CM | POA: Insufficient documentation

## 2013-08-23 DIAGNOSIS — Z01810 Encounter for preprocedural cardiovascular examination: Secondary | ICD-10-CM | POA: Insufficient documentation

## 2013-08-23 HISTORY — PX: INSERTION OF MESH: SHX5868

## 2013-08-23 HISTORY — PX: INGUINAL HERNIA REPAIR: SHX194

## 2013-08-23 SURGERY — REPAIR, HERNIA, INGUINAL, LAPAROSCOPIC
Anesthesia: General | Site: Groin | Laterality: Right | Wound class: Clean

## 2013-08-23 MED ORDER — HYDROMORPHONE HCL PF 1 MG/ML IJ SOLN
INTRAMUSCULAR | Status: AC
  Administered 2013-08-23: 0.5 mg via INTRAVENOUS
  Filled 2013-08-23: qty 1

## 2013-08-23 MED ORDER — SUFENTANIL CITRATE 50 MCG/ML IV SOLN
INTRAVENOUS | Status: DC | PRN
  Administered 2013-08-23: 20 ug via INTRAVENOUS

## 2013-08-23 MED ORDER — ONDANSETRON HCL 4 MG/2ML IJ SOLN
INTRAMUSCULAR | Status: DC | PRN
  Administered 2013-08-23: 4 mg via INTRAMUSCULAR

## 2013-08-23 MED ORDER — GLYCOPYRROLATE 0.2 MG/ML IJ SOLN
INTRAMUSCULAR | Status: DC | PRN
  Administered 2013-08-23: .5 mg via INTRAVENOUS

## 2013-08-23 MED ORDER — LIDOCAINE HCL (CARDIAC) 20 MG/ML IV SOLN
INTRAVENOUS | Status: DC | PRN
  Administered 2013-08-23: 30 mg via INTRAVENOUS

## 2013-08-23 MED ORDER — NEOSTIGMINE METHYLSULFATE 1 MG/ML IJ SOLN
INTRAMUSCULAR | Status: DC | PRN
  Administered 2013-08-23: 3 mg via INTRAVENOUS

## 2013-08-23 MED ORDER — BUPIVACAINE HCL (PF) 0.25 % IJ SOLN
INTRAMUSCULAR | Status: DC | PRN
  Administered 2013-08-23: 20 mL

## 2013-08-23 MED ORDER — ONDANSETRON HCL 4 MG/2ML IJ SOLN: 4.0000 mg | Freq: Once | INTRAMUSCULAR | Status: DC | PRN

## 2013-08-23 MED ORDER — KETOROLAC TROMETHAMINE 30 MG/ML IJ SOLN
15.0000 mg | Freq: Once | INTRAMUSCULAR | Status: AC | PRN
  Administered 2013-08-23: 30 mg via INTRAVENOUS

## 2013-08-23 MED ORDER — 0.9 % SODIUM CHLORIDE (POUR BTL) OPTIME
TOPICAL | Status: DC | PRN
  Administered 2013-08-23: 1000 mL

## 2013-08-23 MED ORDER — LACTATED RINGERS IV SOLN
INTRAVENOUS | Status: DC
  Administered 2013-08-23: 13:00:00 via INTRAVENOUS

## 2013-08-23 MED ORDER — HYDROCODONE-ACETAMINOPHEN 5-325 MG PO TABS: 1.0000 | ORAL_TABLET | Freq: Four times a day (QID) | ORAL | Status: DC | PRN

## 2013-08-23 MED ORDER — HYDROMORPHONE HCL PF 1 MG/ML IJ SOLN
0.2500 mg | INTRAMUSCULAR | Status: DC | PRN
  Administered 2013-08-23 (×2): 0.5 mg via INTRAVENOUS

## 2013-08-23 MED ORDER — KETOROLAC TROMETHAMINE 30 MG/ML IJ SOLN
INTRAMUSCULAR | Status: AC
  Administered 2013-08-23: 30 mg via INTRAVENOUS
  Filled 2013-08-23: qty 1

## 2013-08-23 MED ORDER — EPHEDRINE SULFATE 50 MG/ML IJ SOLN
INTRAMUSCULAR | Status: DC | PRN
  Administered 2013-08-23: 10 mg via INTRAVENOUS

## 2013-08-23 MED ORDER — ROCURONIUM BROMIDE 100 MG/10ML IV SOLN
INTRAVENOUS | Status: DC | PRN
  Administered 2013-08-23: 40 mg via INTRAVENOUS

## 2013-08-23 MED ORDER — PROPOFOL 10 MG/ML IV BOLUS
INTRAVENOUS | Status: DC | PRN
  Administered 2013-08-23: 130 mg via INTRAVENOUS

## 2013-08-23 MED ORDER — MIDAZOLAM HCL 5 MG/5ML IJ SOLN
INTRAMUSCULAR | Status: DC | PRN
  Administered 2013-08-23: 2 mg via INTRAVENOUS

## 2013-08-23 SURGICAL SUPPLY — 39 items
APPLIER CLIP LOGIC TI 5 (MISCELLANEOUS) IMPLANT
BANDAGE ADHESIVE 1X3 (GAUZE/BANDAGES/DRESSINGS) ×6 IMPLANT
BENZOIN TINCTURE PRP APPL 2/3 (GAUZE/BANDAGES/DRESSINGS) ×4 IMPLANT
BLADE SURG ROTATE 9660 (MISCELLANEOUS) IMPLANT
CANISTER SUCTION 2500CC (MISCELLANEOUS) IMPLANT
CHLORAPREP W/TINT 26ML (MISCELLANEOUS) ×2 IMPLANT
COVER SURGICAL LIGHT HANDLE (MISCELLANEOUS) ×2 IMPLANT
DEVICE SECURE STRAP 25 ABSORB (INSTRUMENTS) ×2 IMPLANT
DISSECT BALLN SPACEMKR + OVL (BALLOONS) ×2
DISSECTOR BALLN SPACEMKR + OVL (BALLOONS) ×1 IMPLANT
DISSECTOR BLUNT TIP ENDO 5MM (MISCELLANEOUS) IMPLANT
ELECT REM PT RETURN 9FT ADLT (ELECTROSURGICAL) ×2
ELECTRODE REM PT RTRN 9FT ADLT (ELECTROSURGICAL) ×1 IMPLANT
GLOVE BIO SURGEON STRL SZ7.5 (GLOVE) ×4 IMPLANT
GLOVE BIOGEL PI IND STRL 6.5 (GLOVE) ×2 IMPLANT
GLOVE BIOGEL PI IND STRL 7.0 (GLOVE) ×1 IMPLANT
GLOVE BIOGEL PI IND STRL 7.5 (GLOVE) ×1 IMPLANT
GLOVE BIOGEL PI INDICATOR 6.5 (GLOVE) ×2
GLOVE BIOGEL PI INDICATOR 7.0 (GLOVE) ×1
GLOVE BIOGEL PI INDICATOR 7.5 (GLOVE) ×1
GLOVE SURG SIGNA 7.5 PF LTX (GLOVE) ×2 IMPLANT
GLOVE SURG SS PI 6.0 STRL IVOR (GLOVE) ×4 IMPLANT
GOWN STRL NON-REIN LRG LVL3 (GOWN DISPOSABLE) ×6 IMPLANT
GOWN STRL REIN XL XLG (GOWN DISPOSABLE) ×2 IMPLANT
KIT BASIN OR (CUSTOM PROCEDURE TRAY) ×2 IMPLANT
KIT ROOM TURNOVER OR (KITS) ×2 IMPLANT
MESH 3DMAX LIGHT 4.1X6.2 RT LR (Mesh General) ×2 IMPLANT
MESH 3DMAX LIGHT 4.8X6.7 RT XL (Mesh General) ×2 IMPLANT
NS IRRIG 1000ML POUR BTL (IV SOLUTION) ×2 IMPLANT
PACK EENT II TURBAN DRAPE (CUSTOM PROCEDURE TRAY) IMPLANT
PAD ARMBOARD 7.5X6 YLW CONV (MISCELLANEOUS) ×2 IMPLANT
SET IRRIG TUBING LAPAROSCOPIC (IRRIGATION / IRRIGATOR) IMPLANT
SET TROCAR LAP APPLE-HUNT 5MM (ENDOMECHANICALS) ×2 IMPLANT
SUT MNCRL AB 4-0 PS2 18 (SUTURE) ×2 IMPLANT
SYRINGE 10CC LL (SYRINGE) ×2 IMPLANT
TOWEL OR 17X24 6PK STRL BLUE (TOWEL DISPOSABLE) IMPLANT
TOWEL OR 17X26 10 PK STRL BLUE (TOWEL DISPOSABLE) ×2 IMPLANT
TRAY FOLEY CATH 16FR SILVER (SET/KITS/TRAYS/PACK) ×2 IMPLANT
TRAY LAPAROSCOPIC (CUSTOM PROCEDURE TRAY) ×2 IMPLANT

## 2013-08-23 NOTE — Anesthesia Procedure Notes (Signed)
Procedure Name: Intubation Date/Time: 08/23/2013 3:23 PM Performed by: Charm Barges, Jovanni Rash R Pre-anesthesia Checklist: Patient identified, Emergency Drugs available, Suction available, Patient being monitored and Timeout performed Patient Re-evaluated:Patient Re-evaluated prior to inductionOxygen Delivery Method: Circle system utilized Preoxygenation: Pre-oxygenation with 100% oxygen Intubation Type: IV induction Ventilation: Mask ventilation without difficulty Laryngoscope Size: Mac and 3 Grade View: Grade II Tube type: Oral Tube size: 7.5 mm Number of attempts: 1 Airway Equipment and Method: Stylet Placement Confirmation: ETT inserted through vocal cords under direct vision,  positive ETCO2 and breath sounds checked- equal and bilateral Secured at: 21 cm Tube secured with: Tape Dental Injury: Teeth and Oropharynx as per pre-operative assessment

## 2013-08-23 NOTE — Interval H&P Note (Signed)
History and Physical Interval Note: no change in H and P  08/23/2013 12:56 PM  Molly Lucas  has presented today for surgery, with the diagnosis of right inguinal hernia   The various methods of treatment have been discussed with the patient and family. After consideration of risks, benefits and other options for treatment, the patient has consented to  Procedure(s): LAPAROSCOPIC INGUINAL HERNIA (Right) INSERTION OF MESH (Right) as a surgical intervention .  The patient's history has been reviewed, patient examined, no change in status, stable for surgery.  I have reviewed the patient's chart and labs.  Questions were answered to the patient's satisfaction.     Stehanie Ekstrom A

## 2013-08-23 NOTE — Transfer of Care (Signed)
Immediate Anesthesia Transfer of Care Note  Patient: Molly Lucas  Procedure(s) Performed: Procedure(s): LAPAROSCOPIC INGUINAL HERNIA (Right) INSERTION OF MESH (Right)  Patient Location: PACU  Anesthesia Type:General  Level of Consciousness: awake, alert  and oriented  Airway & Oxygen Therapy: Patient Spontanous Breathing and Patient connected to nasal cannula oxygen  Post-op Assessment: Report given to PACU RN, Post -op Vital signs reviewed and stable and Patient moving all extremities  Post vital signs: Reviewed and stable  Complications: No apparent anesthesia complications

## 2013-08-23 NOTE — Op Note (Signed)
LAPAROSCOPIC INGUINAL HERNIA, INSERTION OF MESH  Procedure Note  RAINI TILEY 08/23/2013   Pre-op Diagnosis: RIGHT INGUINAL HERNIA     Post-op Diagnosis: RIGHT FEMORAL HERNIA  Procedure(s): LAPAROSCOPIC RIGHT FEMORAL HERNIA REPAIR WITH MESH INSERTION OF MESH  Surgeon(s): Shelly Rubenstein, MD  Anesthesia: General  Staff:  Circulator: Simeon Craft, RN Relief Scrub: Lennie Hummer Scrub Person: Carloyn Manner, RN; Denese Killings, CST Circulator Assistant: Carloyn Manner, RN; Lina Sayre, RN; Jani Files, RN  Estimated Blood Loss: Minimal                         Shekia Kuper A   Date: 08/23/2013  Time: 4:09 PM

## 2013-08-23 NOTE — Preoperative (Signed)
Beta Blockers   Reason not to administer Beta Blockers:Not Applicable 

## 2013-08-23 NOTE — Anesthesia Preprocedure Evaluation (Addendum)
Anesthesia Evaluation  Patient identified by MRN, date of birth, ID band Patient awake    Reviewed: Allergy & Precautions, H&P , NPO status , Patient's Chart, lab work & pertinent test results  Airway Mallampati: II TM Distance: >3 FB Neck ROM: Full    Dental  (+) Teeth Intact and Dental Advisory Given   Pulmonary sleep apnea ,  breath sounds clear to auscultation        Cardiovascular hypertension, Pt. on medications Rhythm:Regular     Neuro/Psych    GI/Hepatic   Endo/Other    Renal/GU      Musculoskeletal   Abdominal   Peds  Hematology   Anesthesia Other Findings   Reproductive/Obstetrics                          Anesthesia Physical Anesthesia Plan  ASA: II  Anesthesia Plan: General   Post-op Pain Management:    Induction: Intravenous  Airway Management Planned: Oral ETT  Additional Equipment:   Intra-op Plan:   Post-operative Plan: Extubation in OR  Informed Consent: I have reviewed the patients History and Physical, chart, labs and discussed the procedure including the risks, benefits and alternatives for the proposed anesthesia with the patient or authorized representative who has indicated his/her understanding and acceptance.   Dental advisory given  Plan Discussed with: CRNA, Anesthesiologist and Surgeon  Anesthesia Plan Comments:         Anesthesia Quick Evaluation

## 2013-08-24 ENCOUNTER — Telehealth (INDEPENDENT_AMBULATORY_CARE_PROVIDER_SITE_OTHER): Payer: Self-pay | Admitting: *Deleted

## 2013-08-24 NOTE — Telephone Encounter (Signed)
LMOM for pt to return my call.  I was calling to inform pt of her PO appt with Dr. Magnus Ivan on 09/04/13 at 9:00am.

## 2013-08-24 NOTE — Op Note (Signed)
NAMEMARNEE, SHERRARD                ACCOUNT NO.:  [id number]  MEDICAL RECORD NO.:  [id number]  LOCATION:  [facility]                         FACILITY:  [facility]  PHYSICIAN:  Abigail Miyamoto, M.D. DATE OF BIRTH:  01-01-1946  DATE OF PROCEDURE:  08/23/2013 DATE OF DISCHARGE:  08/23/2013                              OPERATIVE REPORT   PREOPERATIVE DIAGNOSIS:  Recurrent right inguinal hernia.  POSTOPERATIVE DIAGNOSIS:  Right femoral hernia.  PROCEDURE:  Laparoscopic right inguinal hernia repair with mesh.  SURGEON:  Abigail Miyamoto, M.D.  ANESTHESIA:  General endotracheal anesthesia and 0.5% Marcaine.  ESTIMATED BLOOD LOSS:  Minimal.  INDICATIONS:  This is a 67 year old female who 3 months ago underwent an open right inguinal hernia repair with mesh.  She presents with what appeared to be a recurrent hernia.  Decision was made to proceed with laparoscopic approach.  FINDINGS:  The patient was found to have a right femoral hernia which was repaired with 3D max Prolene mesh.  PROCEDURE IN DETAIL:  The patient was brought to the operating room, identified as Molly Lucas.  She was placed supine on the operating table, general anesthesia was induced.  Her abdomen was then prepped and draped in the usual sterile fashion after Foley catheter was inserted. I made a small vertical incision below the umbilicus.  I carried this down to the fascia, which I opened just to the right of midline.  The rectus muscles identified and elevated, and the dissecting balloon was passed underneath the rectus muscle and manipulated towards the pelvis. The dissecting balloon was then insufflated under direct space dissecting out the preperitoneal space.  I then removed the dissecting balloon and insufflated the space with carbon dioxide.  I then placed two 5-mm ports in the patient's lower midline under direct vision.  I then evaluated the right inguinal area.  I was able to identify what appeared to be a  femoral hernia along the femoral canal.  She did not appear to have recurrent inguinal hernia.  There was a sac without any intra-abdominal contents going in to the canal.  I was able to reduce the sac without damage to the vascular structures.  Once the sac was completely reduced, I brought a large piece of 3D max Prolene mesh onto the field.  I placed it through the camera port and then opened an onlay on the inguinal floor.  I then tacked it to [place] ligament up the medial abdominal wall slightly laterally.  She had a very lax abdominal wall and 1 mesh to go for the lateral, so I brought an extra large piece of 3D max mesh into the field.  I placed it through the camera port and opened it in onlay more laterally over the inguinal floor.  Likewise I tacked this down with absorbable tacks as well.  Good coverage of the prior hernia repair as well as the femoral defect appeared to be achieved.  Hemostasis also appeared to be achieved.  I did not see any evidence of left inguinal hernia.  At this point, all ports removed under direct vision.  The preperitoneal spaces appeared to collapse appropriately.  Some air leak into the peritoneal cavity, so  after removing the umbilical port, I opened up the peritoneum and released the air.  I then closed the fascia with a figure-of-eight 0 Vicryl suture. All incisions were anesthetized with Marcaine and closed with 4-0 Monocryl subcuticular sutures.  I also performed an ilioinguinal nerve block on the right side with Marcaine as well.  Steri-Strips and Band-Aids were then applied.  The patient tolerated the procedure well.  All the counts were correct at the end of the procedure.  The patient was then extubated in operating room and taken in stable condition to recovery.     Abigail Miyamoto, M.D.     DB/MEDQ  D:  08/23/2013  T:  08/24/2013  Job:  [id number]

## 2013-08-25 ENCOUNTER — Encounter (HOSPITAL_COMMUNITY): Payer: Self-pay | Admitting: Surgery

## 2013-08-30 NOTE — Anesthesia Postprocedure Evaluation (Signed)
  Anesthesia Post-op Note  Patient: Molly Lucas  Procedure(s) Performed: Procedure(s): LAPAROSCOPIC INGUINAL HERNIA (Right) INSERTION OF MESH (Right)  Patient Location: PACU  Anesthesia Type:General  Level of Consciousness: awake, alert  and oriented  Airway and Oxygen Therapy: Patient Spontanous Breathing and Patient connected to nasal cannula oxygen  Post-op Pain: mild  Post-op Assessment: Post-op Vital signs reviewed, Patient's Cardiovascular Status Stable, Respiratory Function Stable, Patent Airway and Pain level controlled  Post-op Vital Signs: stable  Complications: No apparent anesthesia complications

## 2013-09-04 ENCOUNTER — Ambulatory Visit (INDEPENDENT_AMBULATORY_CARE_PROVIDER_SITE_OTHER): Payer: Medicare Other | Admitting: Surgery

## 2013-09-04 ENCOUNTER — Encounter (INDEPENDENT_AMBULATORY_CARE_PROVIDER_SITE_OTHER): Payer: Self-pay | Admitting: Surgery

## 2013-09-04 VITALS — BP 126/84 | HR 80 | Resp 20 | Ht 63.0 in | Wt 187.4 lb

## 2013-09-04 DIAGNOSIS — Z09 Encounter for follow-up examination after completed treatment for conditions other than malignant neoplasm: Secondary | ICD-10-CM

## 2013-09-04 NOTE — Progress Notes (Signed)
Subjective:     Patient ID: Molly Lucas, female   DOB: 1946/06/20, 67 y.o.   MRN: [id number]  HPI She is here for her first postop visit status post a laparoscopic repair of a femoral hernia on the right side. She reports some mild swelling but minimal discomfort  Review of Systems     Objective:   Physical Exam On exam, her abdomen is soft. Her incisions are well healed. There does appear to be a seroma in The hernia sac but no evidence of recurrent hernia    Assessment:     Patient stable postop     Plan:     I will see her back in 3 weeks. If the seroma persists, I will attempt a needle aspiration. She will still refrain from heavy lifting for 2 more weeks.

## 2013-09-06 DIAGNOSIS — F330 Major depressive disorder, recurrent, mild: Secondary | ICD-10-CM | POA: Diagnosis not present

## 2013-09-08 DIAGNOSIS — F331 Major depressive disorder, recurrent, moderate: Secondary | ICD-10-CM | POA: Diagnosis not present

## 2013-09-14 ENCOUNTER — Other Ambulatory Visit: Payer: Self-pay

## 2013-09-19 ENCOUNTER — Ambulatory Visit (INDEPENDENT_AMBULATORY_CARE_PROVIDER_SITE_OTHER): Payer: Medicare Other | Admitting: Surgery

## 2013-09-19 ENCOUNTER — Encounter (INDEPENDENT_AMBULATORY_CARE_PROVIDER_SITE_OTHER): Payer: Self-pay | Admitting: Surgery

## 2013-09-19 VITALS — BP 128/86 | HR 84 | Temp 99.6°F | Resp 15 | Ht 63.0 in | Wt 189.2 lb

## 2013-09-19 DIAGNOSIS — Z09 Encounter for follow-up examination after completed treatment for conditions other than malignant neoplasm: Secondary | ICD-10-CM

## 2013-09-19 NOTE — Progress Notes (Signed)
Subjective:     Patient ID: Molly Lucas, female   DOB: 11-12-1945, 67 y.o.   MRN: [id number]  HPI She is here for another postoperative visit. She is having discomfort from the seroma in the right groin  Review of Systems     Objective:   Physical Exam On exam, there is a large seroma. I anesthetized the skin with lidocaine after prepping with alcohol. I then used an 18-gauge needle and drained 60 cc of seroma fluid    Assessment:     Patient stable postop     Plan:     I will see her back on November 24 to see if it needs to be drained again before the holiday weekend. She will come back next Monday should it recur before then

## 2013-09-25 ENCOUNTER — Encounter (INDEPENDENT_AMBULATORY_CARE_PROVIDER_SITE_OTHER): Payer: Medicare Other | Admitting: Surgery

## 2013-09-27 DIAGNOSIS — F331 Major depressive disorder, recurrent, moderate: Secondary | ICD-10-CM | POA: Diagnosis not present

## 2013-09-28 DIAGNOSIS — H00029 Hordeolum internum unspecified eye, unspecified eyelid: Secondary | ICD-10-CM | POA: Diagnosis not present

## 2013-09-28 DIAGNOSIS — H2510 Age-related nuclear cataract, unspecified eye: Secondary | ICD-10-CM | POA: Diagnosis not present

## 2013-10-02 ENCOUNTER — Ambulatory Visit (INDEPENDENT_AMBULATORY_CARE_PROVIDER_SITE_OTHER): Payer: Medicare Other | Admitting: Surgery

## 2013-10-02 ENCOUNTER — Encounter (INDEPENDENT_AMBULATORY_CARE_PROVIDER_SITE_OTHER): Payer: Self-pay | Admitting: Surgery

## 2013-10-02 VITALS — BP 128/76 | HR 70 | Temp 98.2°F | Resp 16 | Ht 63.0 in | Wt 180.0 lb

## 2013-10-02 DIAGNOSIS — Z09 Encounter for follow-up examination after completed treatment for conditions other than malignant neoplasm: Secondary | ICD-10-CM

## 2013-10-02 NOTE — Progress Notes (Signed)
Subjective:     Patient ID: Molly Lucas, female   DOB: 05-Dec-1945, 67 y.o.   MRN: [id number]  HPI She is here today for reevaluation. She feels that the seroma has recurred in the right groin  Review of Systems     Objective:   Physical Exam There is a recurrent seroma. I prepped her with alcohol, anesthetized with lidocaine, and inserted an 18-gauge needle and was able to drain 40 cc of seroma fluid from the right groin area    Assessment:     Patient stable postop     Plan:      I will see her back next week for reevaluation

## 2013-10-03 ENCOUNTER — Encounter (INDEPENDENT_AMBULATORY_CARE_PROVIDER_SITE_OTHER): Payer: Medicare Other | Admitting: Surgery

## 2013-10-11 ENCOUNTER — Ambulatory Visit (INDEPENDENT_AMBULATORY_CARE_PROVIDER_SITE_OTHER): Payer: Medicare Other | Admitting: Surgery

## 2013-10-11 ENCOUNTER — Encounter (INDEPENDENT_AMBULATORY_CARE_PROVIDER_SITE_OTHER): Payer: Self-pay | Admitting: Surgery

## 2013-10-11 VITALS — BP 129/86 | HR 86 | Temp 97.4°F | Resp 14 | Ht 63.0 in | Wt 193.6 lb

## 2013-10-11 DIAGNOSIS — Z09 Encounter for follow-up examination after completed treatment for conditions other than malignant neoplasm: Secondary | ICD-10-CM

## 2013-10-11 NOTE — Progress Notes (Signed)
Subjective:     Patient ID: Molly Lucas, female   DOB: 10-Jun-1946, 67 y.o.   MRN: [id number]  HPI She is here for another postop visit. She has no discomfort except when doing some exercises.  Review of Systems     Objective:   Physical Exam On exam, there is a recurrence of a small seroma in the right inguinal area. I prepped the area with alcohol, anesthetized with lidocaine, and inserted an 18-gauge needle. I was able to drain 30 cc of seroma fluid    Assessment:     Patient stable postop     Plan:     I will see her back in 3 weeks to see if this needs to be drained again. She will resume her normal activities

## 2013-10-12 DIAGNOSIS — F331 Major depressive disorder, recurrent, moderate: Secondary | ICD-10-CM | POA: Diagnosis not present

## 2013-10-30 DIAGNOSIS — F331 Major depressive disorder, recurrent, moderate: Secondary | ICD-10-CM | POA: Diagnosis not present

## 2013-11-06 ENCOUNTER — Encounter (INDEPENDENT_AMBULATORY_CARE_PROVIDER_SITE_OTHER): Payer: Self-pay | Admitting: Surgery

## 2013-11-06 ENCOUNTER — Ambulatory Visit (INDEPENDENT_AMBULATORY_CARE_PROVIDER_SITE_OTHER): Payer: Medicare Other | Admitting: Surgery

## 2013-11-06 VITALS — BP 127/80 | HR 83 | Temp 98.3°F | Resp 14 | Ht 63.0 in | Wt 191.2 lb

## 2013-11-06 DIAGNOSIS — Z09 Encounter for follow-up examination after completed treatment for conditions other than malignant neoplasm: Secondary | ICD-10-CM

## 2013-11-06 NOTE — Progress Notes (Signed)
Subjective:     Patient ID: Molly Lucas, female   DOB: 1946-05-09, 67 y.o.   MRN: [id number]  HPI She is here for another postop visit. She has no discomfort in her right groin  Review of Systems     Objective:   Physical Exam I could still palpate a small seroma. I inserted an 18-gauge needle but was unable to drain any fluid    Assessment:     Patient stable postop     Plan:     I will see her back in 2 months unless he has difficulty or pain with her groin

## 2013-11-16 DIAGNOSIS — F331 Major depressive disorder, recurrent, moderate: Secondary | ICD-10-CM | POA: Diagnosis not present

## 2013-11-30 DIAGNOSIS — S8263XA Displaced fracture of lateral malleolus of unspecified fibula, initial encounter for closed fracture: Secondary | ICD-10-CM | POA: Diagnosis not present

## 2013-12-06 DIAGNOSIS — F331 Major depressive disorder, recurrent, moderate: Secondary | ICD-10-CM | POA: Diagnosis not present

## 2013-12-13 DIAGNOSIS — S8263XA Displaced fracture of lateral malleolus of unspecified fibula, initial encounter for closed fracture: Secondary | ICD-10-CM | POA: Diagnosis not present

## 2013-12-15 DIAGNOSIS — F331 Major depressive disorder, recurrent, moderate: Secondary | ICD-10-CM | POA: Diagnosis not present

## 2013-12-21 DIAGNOSIS — F331 Major depressive disorder, recurrent, moderate: Secondary | ICD-10-CM | POA: Diagnosis not present

## 2014-01-05 ENCOUNTER — Encounter (INDEPENDENT_AMBULATORY_CARE_PROVIDER_SITE_OTHER): Payer: Medicare Other | Admitting: Surgery

## 2014-01-09 DIAGNOSIS — F331 Major depressive disorder, recurrent, moderate: Secondary | ICD-10-CM | POA: Diagnosis not present

## 2014-01-10 ENCOUNTER — Encounter (INDEPENDENT_AMBULATORY_CARE_PROVIDER_SITE_OTHER): Payer: Self-pay | Admitting: Surgery

## 2014-01-10 DIAGNOSIS — S8263XA Displaced fracture of lateral malleolus of unspecified fibula, initial encounter for closed fracture: Secondary | ICD-10-CM | POA: Diagnosis not present

## 2014-01-15 DIAGNOSIS — F331 Major depressive disorder, recurrent, moderate: Secondary | ICD-10-CM | POA: Diagnosis not present

## 2014-01-19 DIAGNOSIS — F331 Major depressive disorder, recurrent, moderate: Secondary | ICD-10-CM | POA: Diagnosis not present

## 2014-01-22 DIAGNOSIS — F331 Major depressive disorder, recurrent, moderate: Secondary | ICD-10-CM | POA: Diagnosis not present

## 2014-02-01 DIAGNOSIS — F331 Major depressive disorder, recurrent, moderate: Secondary | ICD-10-CM | POA: Diagnosis not present

## 2014-02-15 DIAGNOSIS — F331 Major depressive disorder, recurrent, moderate: Secondary | ICD-10-CM | POA: Diagnosis not present

## 2014-02-19 DIAGNOSIS — F331 Major depressive disorder, recurrent, moderate: Secondary | ICD-10-CM | POA: Diagnosis not present

## 2014-02-26 DIAGNOSIS — F330 Major depressive disorder, recurrent, mild: Secondary | ICD-10-CM | POA: Diagnosis not present

## 2014-02-26 DIAGNOSIS — F331 Major depressive disorder, recurrent, moderate: Secondary | ICD-10-CM | POA: Diagnosis not present

## 2014-03-12 DIAGNOSIS — F3289 Other specified depressive episodes: Secondary | ICD-10-CM | POA: Diagnosis not present

## 2014-03-12 DIAGNOSIS — N318 Other neuromuscular dysfunction of bladder: Secondary | ICD-10-CM | POA: Diagnosis not present

## 2014-03-12 DIAGNOSIS — F329 Major depressive disorder, single episode, unspecified: Secondary | ICD-10-CM | POA: Diagnosis not present

## 2014-03-12 DIAGNOSIS — M545 Low back pain, unspecified: Secondary | ICD-10-CM | POA: Diagnosis not present

## 2014-03-14 DIAGNOSIS — M545 Low back pain, unspecified: Secondary | ICD-10-CM | POA: Diagnosis not present

## 2014-03-14 DIAGNOSIS — F3289 Other specified depressive episodes: Secondary | ICD-10-CM | POA: Diagnosis not present

## 2014-03-14 DIAGNOSIS — F329 Major depressive disorder, single episode, unspecified: Secondary | ICD-10-CM | POA: Diagnosis not present

## 2014-03-14 DIAGNOSIS — N318 Other neuromuscular dysfunction of bladder: Secondary | ICD-10-CM | POA: Diagnosis not present

## 2014-03-19 DIAGNOSIS — F329 Major depressive disorder, single episode, unspecified: Secondary | ICD-10-CM | POA: Diagnosis not present

## 2014-03-19 DIAGNOSIS — F3289 Other specified depressive episodes: Secondary | ICD-10-CM | POA: Diagnosis not present

## 2014-03-19 DIAGNOSIS — M545 Low back pain, unspecified: Secondary | ICD-10-CM | POA: Diagnosis not present

## 2014-03-19 DIAGNOSIS — N318 Other neuromuscular dysfunction of bladder: Secondary | ICD-10-CM | POA: Diagnosis not present

## 2014-03-22 DIAGNOSIS — N318 Other neuromuscular dysfunction of bladder: Secondary | ICD-10-CM | POA: Diagnosis not present

## 2014-03-22 DIAGNOSIS — M545 Low back pain, unspecified: Secondary | ICD-10-CM | POA: Diagnosis not present

## 2014-03-22 DIAGNOSIS — F329 Major depressive disorder, single episode, unspecified: Secondary | ICD-10-CM | POA: Diagnosis not present

## 2014-03-22 DIAGNOSIS — F3289 Other specified depressive episodes: Secondary | ICD-10-CM | POA: Diagnosis not present

## 2014-03-26 DIAGNOSIS — F331 Major depressive disorder, recurrent, moderate: Secondary | ICD-10-CM | POA: Diagnosis not present

## 2014-03-28 DIAGNOSIS — F329 Major depressive disorder, single episode, unspecified: Secondary | ICD-10-CM | POA: Diagnosis not present

## 2014-03-28 DIAGNOSIS — F3289 Other specified depressive episodes: Secondary | ICD-10-CM | POA: Diagnosis not present

## 2014-03-28 DIAGNOSIS — N318 Other neuromuscular dysfunction of bladder: Secondary | ICD-10-CM | POA: Diagnosis not present

## 2014-03-28 DIAGNOSIS — M545 Low back pain, unspecified: Secondary | ICD-10-CM | POA: Diagnosis not present

## 2014-04-04 DIAGNOSIS — F331 Major depressive disorder, recurrent, moderate: Secondary | ICD-10-CM | POA: Diagnosis not present

## 2014-04-05 DIAGNOSIS — M545 Low back pain, unspecified: Secondary | ICD-10-CM | POA: Diagnosis not present

## 2014-04-05 DIAGNOSIS — F329 Major depressive disorder, single episode, unspecified: Secondary | ICD-10-CM | POA: Diagnosis not present

## 2014-04-05 DIAGNOSIS — N318 Other neuromuscular dysfunction of bladder: Secondary | ICD-10-CM | POA: Diagnosis not present

## 2014-04-05 DIAGNOSIS — F3289 Other specified depressive episodes: Secondary | ICD-10-CM | POA: Diagnosis not present

## 2014-04-09 DIAGNOSIS — F331 Major depressive disorder, recurrent, moderate: Secondary | ICD-10-CM | POA: Diagnosis not present

## 2014-04-17 DIAGNOSIS — F330 Major depressive disorder, recurrent, mild: Secondary | ICD-10-CM | POA: Diagnosis not present

## 2014-04-18 DIAGNOSIS — F331 Major depressive disorder, recurrent, moderate: Secondary | ICD-10-CM | POA: Diagnosis not present

## 2014-04-24 DIAGNOSIS — F331 Major depressive disorder, recurrent, moderate: Secondary | ICD-10-CM | POA: Diagnosis not present

## 2014-05-09 DIAGNOSIS — Z79899 Other long term (current) drug therapy: Secondary | ICD-10-CM | POA: Diagnosis not present

## 2014-05-09 DIAGNOSIS — F331 Major depressive disorder, recurrent, moderate: Secondary | ICD-10-CM | POA: Diagnosis not present

## 2014-05-09 DIAGNOSIS — R5381 Other malaise: Secondary | ICD-10-CM | POA: Diagnosis not present

## 2014-05-09 DIAGNOSIS — D649 Anemia, unspecified: Secondary | ICD-10-CM | POA: Diagnosis not present

## 2014-05-09 DIAGNOSIS — E785 Hyperlipidemia, unspecified: Secondary | ICD-10-CM | POA: Diagnosis not present

## 2014-05-09 DIAGNOSIS — R5383 Other fatigue: Secondary | ICD-10-CM | POA: Diagnosis not present

## 2014-05-16 DIAGNOSIS — F331 Major depressive disorder, recurrent, moderate: Secondary | ICD-10-CM | POA: Diagnosis not present

## 2014-05-18 ENCOUNTER — Encounter (HOSPITAL_COMMUNITY): Payer: Self-pay

## 2014-05-18 ENCOUNTER — Other Ambulatory Visit (HOSPITAL_COMMUNITY): Payer: Self-pay | Admitting: Internal Medicine

## 2014-05-18 ENCOUNTER — Encounter (HOSPITAL_COMMUNITY)
Admission: RE | Admit: 2014-05-18 | Discharge: 2014-05-18 | Disposition: A | Discharge status: Home / Self Care | Payer: Medicare Other | Source: Ambulatory Visit | Attending: Internal Medicine | Admitting: Internal Medicine

## 2014-05-18 DIAGNOSIS — D649 Anemia, unspecified: Secondary | ICD-10-CM | POA: Insufficient documentation

## 2014-05-18 MED ORDER — SODIUM CHLORIDE 0.9 % IV SOLN
Freq: Every day | INTRAVENOUS | Status: DC
  Administered 2014-05-18: 14:00:00 via INTRAVENOUS

## 2014-05-18 MED ORDER — SODIUM CHLORIDE 0.9 % IV SOLN
510.0000 mg | INTRAVENOUS | Status: DC
  Administered 2014-05-18: 510 mg via INTRAVENOUS
  Filled 2014-05-18: qty 17

## 2014-05-18 NOTE — Discharge Instructions (Signed)
IF YOU ARE GOING TO BE 15 OR MINUTES LATE FOR YOUR APPOINTMENT, PLEASE CALL [phone] TO MAKE OTHER ARRANGEMENTS FOR YOUR TREATMENT ° °IF YOU ARRIVE EARLY FOR YOUR SCHEDULED APPOINTMENT , YOU MAY HAVE TO WAIT UNTIL YOUR SCHEDULED TIME.Ferumoxytol injection °What is this medicine? °FERUMOXYTOL is an iron complex. Iron is used to make healthy red blood cells, which carry oxygen and nutrients throughout the body. This medicine is used to treat iron deficiency anemia in people with chronic kidney disease. °This medicine may be used for other purposes; ask your health care provider or pharmacist if you have questions. °COMMON BRAND NAME(S): Feraheme °What should I tell my health care provider before I take this medicine? °They need to know if you have any of these conditions: °-anemia not caused by low iron levels °-high levels of iron in the blood °-magnetic resonance imaging (MRI) test scheduled °-an unusual or allergic reaction to iron, other medicines, foods, dyes, or preservatives °-pregnant or trying to get pregnant °-breast-feeding °How should I use this medicine? °This medicine is for injection into a vein. It is given by a health care professional in a hospital or clinic setting. °Talk to your pediatrician regarding the use of this medicine in children. Special care may be needed. °Overdosage: If you think you've taken too much of this medicine contact a poison control center or emergency room at once. °Overdosage: If you think you have taken too much of this medicine contact a poison control center or emergency room at once. °NOTE: This medicine is only for you. Do not share this medicine with others. °What if I miss a dose? °It is important not to miss your dose. Call your doctor or health care professional if you are unable to keep an appointment. °What may interact with this medicine? °This medicine may interact with the following medications: °-other iron products °This list may not describe all possible  interactions. Give your health care provider a list of all the medicines, herbs, non-prescription drugs, or dietary supplements you use. Also tell them if you smoke, drink alcohol, or use illegal drugs. Some items may interact with your medicine. °What should I watch for while using this medicine? °Visit your doctor or healthcare professional regularly. Tell your doctor or healthcare professional if your symptoms do not start to get better or if they get worse. You may need blood work done while you are taking this medicine. °You may need to follow a special diet. Talk to your doctor. Foods that contain iron include: whole grains/cereals, dried fruits, beans, or peas, leafy green vegetables, and organ meats (liver, kidney). °What side effects may I notice from receiving this medicine? °Side effects that you should report to your doctor or health care professional as soon as possible: °-allergic reactions like skin rash, itching or hives, swelling of the face, lips, or tongue °-breathing problems °-changes in blood pressure °-feeling faint or lightheaded, falls °-fever or chills °-flushing, sweating, or hot feelings °-swelling of the ankles or feet °Side effects that usually do not require medical attention (Report these to your doctor or health care professional if they continue or are bothersome.): °-diarrhea °-headache °-nausea, vomiting °-stomach pain °This list may not describe all possible side effects. Call your doctor for medical advice about side effects. You may report side effects to FDA at 1-800-FDA-1088. °Where should I keep my medicine? °This drug is given in a hospital or clinic and will not be stored at home. °NOTE: This sheet is a summary. It may   not cover all possible information. If you have questions about this medicine, talk to your doctor, pharmacist, or health care provider. °© 2015, [employer]vier/[employer]. (2012-06-10 15:23:36) ° °

## 2014-05-18 NOTE — Progress Notes (Signed)
Had first feraheme infusion today in Short Stay. Tolerated well.

## 2014-05-25 ENCOUNTER — Encounter (HOSPITAL_COMMUNITY): Payer: Self-pay

## 2014-05-25 ENCOUNTER — Encounter (HOSPITAL_COMMUNITY)
Admission: RE | Admit: 2014-05-25 | Discharge: 2014-05-25 | Disposition: A | Discharge status: Home / Self Care | Payer: Medicare Other | Source: Ambulatory Visit | Attending: Internal Medicine | Admitting: Internal Medicine

## 2014-05-25 DIAGNOSIS — D649 Anemia, unspecified: Secondary | ICD-10-CM | POA: Diagnosis not present

## 2014-05-25 MED ORDER — SODIUM CHLORIDE 0.9 % IV SOLN
Freq: Every day | INTRAVENOUS | Status: DC
  Administered 2014-05-25: 14:00:00 via INTRAVENOUS

## 2014-05-25 MED ORDER — SODIUM CHLORIDE 0.9 % IV SOLN
510.0000 mg | Freq: Every day | INTRAVENOUS | Status: AC
  Administered 2014-05-25: 510 mg via INTRAVENOUS
  Filled 2014-05-25: qty 17

## 2014-05-25 NOTE — Progress Notes (Signed)
2nd feraheme infusion is complete, observed 12min.post infusion. No adverse reaction noted. Pt tolerated well.

## 2014-05-30 DIAGNOSIS — R110 Nausea: Secondary | ICD-10-CM | POA: Diagnosis not present

## 2014-05-30 DIAGNOSIS — D508 Other iron deficiency anemias: Secondary | ICD-10-CM | POA: Diagnosis not present

## 2014-06-12 DIAGNOSIS — F330 Major depressive disorder, recurrent, mild: Secondary | ICD-10-CM | POA: Diagnosis not present

## 2014-06-12 DIAGNOSIS — R110 Nausea: Secondary | ICD-10-CM | POA: Diagnosis not present

## 2014-06-12 DIAGNOSIS — D508 Other iron deficiency anemias: Secondary | ICD-10-CM | POA: Diagnosis not present

## 2014-06-14 DIAGNOSIS — R112 Nausea with vomiting, unspecified: Secondary | ICD-10-CM | POA: Diagnosis not present

## 2014-06-14 DIAGNOSIS — R110 Nausea: Secondary | ICD-10-CM | POA: Diagnosis not present

## 2014-06-14 DIAGNOSIS — D509 Iron deficiency anemia, unspecified: Secondary | ICD-10-CM | POA: Diagnosis not present

## 2014-06-22 DIAGNOSIS — D509 Iron deficiency anemia, unspecified: Secondary | ICD-10-CM | POA: Diagnosis not present

## 2014-06-27 DIAGNOSIS — E785 Hyperlipidemia, unspecified: Secondary | ICD-10-CM | POA: Diagnosis not present

## 2014-06-27 DIAGNOSIS — F331 Major depressive disorder, recurrent, moderate: Secondary | ICD-10-CM | POA: Diagnosis not present

## 2014-06-27 DIAGNOSIS — I10 Essential (primary) hypertension: Secondary | ICD-10-CM | POA: Diagnosis not present

## 2014-06-27 DIAGNOSIS — R82998 Other abnormal findings in urine: Secondary | ICD-10-CM | POA: Diagnosis not present

## 2014-07-02 DIAGNOSIS — F331 Major depressive disorder, recurrent, moderate: Secondary | ICD-10-CM | POA: Diagnosis not present

## 2014-07-04 DIAGNOSIS — Z79899 Other long term (current) drug therapy: Secondary | ICD-10-CM | POA: Diagnosis not present

## 2014-07-04 DIAGNOSIS — Z0000 Encounter for general adult medical examination without abnormal findings: Secondary | ICD-10-CM | POA: Diagnosis not present

## 2014-07-04 DIAGNOSIS — G4733 Obstructive sleep apnea (adult) (pediatric): Secondary | ICD-10-CM | POA: Diagnosis not present

## 2014-07-04 DIAGNOSIS — Z6830 Body mass index (BMI) 30.0-30.9, adult: Secondary | ICD-10-CM | POA: Diagnosis not present

## 2014-07-04 DIAGNOSIS — M545 Low back pain, unspecified: Secondary | ICD-10-CM | POA: Diagnosis not present

## 2014-07-04 DIAGNOSIS — D649 Anemia, unspecified: Secondary | ICD-10-CM | POA: Diagnosis not present

## 2014-07-04 DIAGNOSIS — Z1331 Encounter for screening for depression: Secondary | ICD-10-CM | POA: Diagnosis not present

## 2014-07-04 DIAGNOSIS — I10 Essential (primary) hypertension: Secondary | ICD-10-CM | POA: Diagnosis not present

## 2014-07-06 DIAGNOSIS — Z1212 Encounter for screening for malignant neoplasm of rectum: Secondary | ICD-10-CM | POA: Diagnosis not present

## 2014-07-18 DIAGNOSIS — S8263XA Displaced fracture of lateral malleolus of unspecified fibula, initial encounter for closed fracture: Secondary | ICD-10-CM | POA: Diagnosis not present

## 2014-07-18 DIAGNOSIS — M169 Osteoarthritis of hip, unspecified: Secondary | ICD-10-CM | POA: Diagnosis not present

## 2014-07-18 DIAGNOSIS — M1610 Unilateral primary osteoarthritis, unspecified hip: Secondary | ICD-10-CM | POA: Diagnosis not present

## 2014-07-20 DIAGNOSIS — F331 Major depressive disorder, recurrent, moderate: Secondary | ICD-10-CM | POA: Diagnosis not present

## 2014-07-30 DIAGNOSIS — F331 Major depressive disorder, recurrent, moderate: Secondary | ICD-10-CM | POA: Diagnosis not present

## 2014-08-02 DIAGNOSIS — M25579 Pain in unspecified ankle and joints of unspecified foot: Secondary | ICD-10-CM | POA: Diagnosis not present

## 2014-08-06 DIAGNOSIS — M25579 Pain in unspecified ankle and joints of unspecified foot: Secondary | ICD-10-CM | POA: Diagnosis not present

## 2014-08-14 DIAGNOSIS — M25571 Pain in right ankle and joints of right foot: Secondary | ICD-10-CM | POA: Diagnosis not present

## 2014-08-14 DIAGNOSIS — R531 Weakness: Secondary | ICD-10-CM | POA: Diagnosis not present

## 2014-08-21 DIAGNOSIS — R531 Weakness: Secondary | ICD-10-CM | POA: Diagnosis not present

## 2014-08-21 DIAGNOSIS — M25571 Pain in right ankle and joints of right foot: Secondary | ICD-10-CM | POA: Diagnosis not present

## 2014-08-24 DIAGNOSIS — F331 Major depressive disorder, recurrent, moderate: Secondary | ICD-10-CM | POA: Diagnosis not present

## 2014-08-28 DIAGNOSIS — R531 Weakness: Secondary | ICD-10-CM | POA: Diagnosis not present

## 2014-08-28 DIAGNOSIS — M25571 Pain in right ankle and joints of right foot: Secondary | ICD-10-CM | POA: Diagnosis not present

## 2014-08-30 DIAGNOSIS — M25571 Pain in right ankle and joints of right foot: Secondary | ICD-10-CM | POA: Diagnosis not present

## 2014-08-30 DIAGNOSIS — R531 Weakness: Secondary | ICD-10-CM | POA: Diagnosis not present

## 2014-09-05 DIAGNOSIS — R531 Weakness: Secondary | ICD-10-CM | POA: Diagnosis not present

## 2014-09-05 DIAGNOSIS — M25571 Pain in right ankle and joints of right foot: Secondary | ICD-10-CM | POA: Diagnosis not present

## 2014-09-11 DIAGNOSIS — F331 Major depressive disorder, recurrent, moderate: Secondary | ICD-10-CM | POA: Diagnosis not present

## 2014-09-12 DIAGNOSIS — R531 Weakness: Secondary | ICD-10-CM | POA: Diagnosis not present

## 2014-09-12 DIAGNOSIS — M25571 Pain in right ankle and joints of right foot: Secondary | ICD-10-CM | POA: Diagnosis not present

## 2014-09-13 DIAGNOSIS — H0014 Chalazion left upper eyelid: Secondary | ICD-10-CM | POA: Diagnosis not present

## 2014-09-18 DIAGNOSIS — M25571 Pain in right ankle and joints of right foot: Secondary | ICD-10-CM | POA: Diagnosis not present

## 2014-09-18 DIAGNOSIS — F331 Major depressive disorder, recurrent, moderate: Secondary | ICD-10-CM | POA: Diagnosis not present

## 2014-09-18 DIAGNOSIS — R531 Weakness: Secondary | ICD-10-CM | POA: Diagnosis not present

## 2014-10-23 DIAGNOSIS — F331 Major depressive disorder, recurrent, moderate: Secondary | ICD-10-CM | POA: Diagnosis not present

## 2014-10-25 DIAGNOSIS — F330 Major depressive disorder, recurrent, mild: Secondary | ICD-10-CM | POA: Diagnosis not present

## 2014-11-13 DIAGNOSIS — F331 Major depressive disorder, recurrent, moderate: Secondary | ICD-10-CM | POA: Diagnosis not present

## 2014-11-27 DIAGNOSIS — F331 Major depressive disorder, recurrent, moderate: Secondary | ICD-10-CM | POA: Diagnosis not present

## 2014-12-03 DIAGNOSIS — H2513 Age-related nuclear cataract, bilateral: Secondary | ICD-10-CM | POA: Diagnosis not present

## 2014-12-06 DIAGNOSIS — F331 Major depressive disorder, recurrent, moderate: Secondary | ICD-10-CM | POA: Diagnosis not present

## 2014-12-28 DIAGNOSIS — F331 Major depressive disorder, recurrent, moderate: Secondary | ICD-10-CM | POA: Diagnosis not present

## 2015-01-01 DIAGNOSIS — Z23 Encounter for immunization: Secondary | ICD-10-CM | POA: Diagnosis not present

## 2015-01-01 DIAGNOSIS — J069 Acute upper respiratory infection, unspecified: Secondary | ICD-10-CM | POA: Diagnosis not present

## 2015-01-01 DIAGNOSIS — F331 Major depressive disorder, recurrent, moderate: Secondary | ICD-10-CM | POA: Diagnosis not present

## 2015-01-01 DIAGNOSIS — R05 Cough: Secondary | ICD-10-CM | POA: Diagnosis not present

## 2015-01-08 DIAGNOSIS — F331 Major depressive disorder, recurrent, moderate: Secondary | ICD-10-CM | POA: Diagnosis not present

## 2015-01-14 DIAGNOSIS — Z6831 Body mass index (BMI) 31.0-31.9, adult: Secondary | ICD-10-CM | POA: Diagnosis not present

## 2015-01-14 DIAGNOSIS — R0989 Other specified symptoms and signs involving the circulatory and respiratory systems: Secondary | ICD-10-CM | POA: Diagnosis not present

## 2015-01-14 DIAGNOSIS — F331 Major depressive disorder, recurrent, moderate: Secondary | ICD-10-CM | POA: Diagnosis not present

## 2015-01-14 DIAGNOSIS — J029 Acute pharyngitis, unspecified: Secondary | ICD-10-CM | POA: Diagnosis not present

## 2015-01-14 DIAGNOSIS — K219 Gastro-esophageal reflux disease without esophagitis: Secondary | ICD-10-CM | POA: Diagnosis not present

## 2015-01-22 DIAGNOSIS — F331 Major depressive disorder, recurrent, moderate: Secondary | ICD-10-CM | POA: Diagnosis not present

## 2015-01-29 DIAGNOSIS — J312 Chronic pharyngitis: Secondary | ICD-10-CM | POA: Diagnosis not present

## 2015-01-29 DIAGNOSIS — J328 Other chronic sinusitis: Secondary | ICD-10-CM | POA: Diagnosis not present

## 2015-01-29 DIAGNOSIS — F331 Major depressive disorder, recurrent, moderate: Secondary | ICD-10-CM | POA: Diagnosis not present

## 2015-01-29 DIAGNOSIS — R5383 Other fatigue: Secondary | ICD-10-CM | POA: Diagnosis not present

## 2015-02-19 DIAGNOSIS — F331 Major depressive disorder, recurrent, moderate: Secondary | ICD-10-CM | POA: Diagnosis not present

## 2015-02-26 DIAGNOSIS — F331 Major depressive disorder, recurrent, moderate: Secondary | ICD-10-CM | POA: Diagnosis not present

## 2015-03-05 DIAGNOSIS — F331 Major depressive disorder, recurrent, moderate: Secondary | ICD-10-CM | POA: Diagnosis not present

## 2015-03-05 DIAGNOSIS — J309 Allergic rhinitis, unspecified: Secondary | ICD-10-CM | POA: Diagnosis not present

## 2015-03-05 DIAGNOSIS — J328 Other chronic sinusitis: Secondary | ICD-10-CM | POA: Diagnosis not present

## 2015-03-12 DIAGNOSIS — K136 Irritative hyperplasia of oral mucosa: Secondary | ICD-10-CM | POA: Diagnosis not present

## 2015-03-19 DIAGNOSIS — K136 Irritative hyperplasia of oral mucosa: Secondary | ICD-10-CM | POA: Diagnosis not present

## 2015-03-26 DIAGNOSIS — F331 Major depressive disorder, recurrent, moderate: Secondary | ICD-10-CM | POA: Diagnosis not present

## 2015-04-03 DIAGNOSIS — Z01419 Encounter for gynecological examination (general) (routine) without abnormal findings: Secondary | ICD-10-CM | POA: Diagnosis not present

## 2015-04-03 DIAGNOSIS — Z1231 Encounter for screening mammogram for malignant neoplasm of breast: Secondary | ICD-10-CM | POA: Diagnosis not present

## 2015-04-03 DIAGNOSIS — R32 Unspecified urinary incontinence: Secondary | ICD-10-CM | POA: Diagnosis not present

## 2015-04-03 DIAGNOSIS — Z124 Encounter for screening for malignant neoplasm of cervix: Secondary | ICD-10-CM | POA: Diagnosis not present

## 2015-04-04 DIAGNOSIS — F331 Major depressive disorder, recurrent, moderate: Secondary | ICD-10-CM | POA: Diagnosis not present

## 2015-04-19 DIAGNOSIS — F331 Major depressive disorder, recurrent, moderate: Secondary | ICD-10-CM | POA: Diagnosis not present

## 2015-04-29 DIAGNOSIS — F330 Major depressive disorder, recurrent, mild: Secondary | ICD-10-CM | POA: Diagnosis not present

## 2015-05-14 DIAGNOSIS — F331 Major depressive disorder, recurrent, moderate: Secondary | ICD-10-CM | POA: Diagnosis not present

## 2015-05-17 DIAGNOSIS — J069 Acute upper respiratory infection, unspecified: Secondary | ICD-10-CM | POA: Diagnosis not present

## 2015-05-17 DIAGNOSIS — R05 Cough: Secondary | ICD-10-CM | POA: Diagnosis not present

## 2015-05-17 DIAGNOSIS — R0989 Other specified symptoms and signs involving the circulatory and respiratory systems: Secondary | ICD-10-CM | POA: Diagnosis not present

## 2015-05-20 DIAGNOSIS — L820 Inflamed seborrheic keratosis: Secondary | ICD-10-CM | POA: Diagnosis not present

## 2015-05-20 DIAGNOSIS — D2239 Melanocytic nevi of other parts of face: Secondary | ICD-10-CM | POA: Diagnosis not present

## 2015-05-21 DIAGNOSIS — F331 Major depressive disorder, recurrent, moderate: Secondary | ICD-10-CM | POA: Diagnosis not present

## 2015-05-28 DIAGNOSIS — M4806 Spinal stenosis, lumbar region: Secondary | ICD-10-CM | POA: Diagnosis not present

## 2015-05-29 DIAGNOSIS — M4806 Spinal stenosis, lumbar region: Secondary | ICD-10-CM | POA: Diagnosis not present

## 2015-05-29 DIAGNOSIS — M9973 Connective tissue and disc stenosis of intervertebral foramina of lumbar region: Secondary | ICD-10-CM | POA: Diagnosis not present

## 2015-05-29 DIAGNOSIS — M47816 Spondylosis without myelopathy or radiculopathy, lumbar region: Secondary | ICD-10-CM | POA: Diagnosis not present

## 2015-05-29 DIAGNOSIS — M5126 Other intervertebral disc displacement, lumbar region: Secondary | ICD-10-CM | POA: Diagnosis not present

## 2015-06-04 DIAGNOSIS — F331 Major depressive disorder, recurrent, moderate: Secondary | ICD-10-CM | POA: Diagnosis not present

## 2015-06-04 DIAGNOSIS — M4155 Other secondary scoliosis, thoracolumbar region: Secondary | ICD-10-CM | POA: Diagnosis not present

## 2015-06-04 DIAGNOSIS — M4806 Spinal stenosis, lumbar region: Secondary | ICD-10-CM | POA: Diagnosis not present

## 2015-06-04 DIAGNOSIS — Z6834 Body mass index (BMI) 34.0-34.9, adult: Secondary | ICD-10-CM | POA: Diagnosis not present

## 2015-06-04 DIAGNOSIS — M47816 Spondylosis without myelopathy or radiculopathy, lumbar region: Secondary | ICD-10-CM | POA: Diagnosis not present

## 2015-06-04 DIAGNOSIS — D492 Neoplasm of unspecified behavior of bone, soft tissue, and skin: Secondary | ICD-10-CM | POA: Diagnosis not present

## 2015-06-04 DIAGNOSIS — M546 Pain in thoracic spine: Secondary | ICD-10-CM | POA: Diagnosis not present

## 2015-06-04 DIAGNOSIS — M5136 Other intervertebral disc degeneration, lumbar region: Secondary | ICD-10-CM | POA: Diagnosis not present

## 2015-06-12 DIAGNOSIS — F331 Major depressive disorder, recurrent, moderate: Secondary | ICD-10-CM | POA: Diagnosis not present

## 2015-07-02 DIAGNOSIS — E785 Hyperlipidemia, unspecified: Secondary | ICD-10-CM | POA: Diagnosis not present

## 2015-07-02 DIAGNOSIS — I10 Essential (primary) hypertension: Secondary | ICD-10-CM | POA: Diagnosis not present

## 2015-07-04 DIAGNOSIS — F331 Major depressive disorder, recurrent, moderate: Secondary | ICD-10-CM | POA: Diagnosis not present

## 2015-07-10 DIAGNOSIS — N3281 Overactive bladder: Secondary | ICD-10-CM | POA: Diagnosis not present

## 2015-07-10 DIAGNOSIS — M545 Low back pain: Secondary | ICD-10-CM | POA: Diagnosis not present

## 2015-07-10 DIAGNOSIS — I10 Essential (primary) hypertension: Secondary | ICD-10-CM | POA: Diagnosis not present

## 2015-07-10 DIAGNOSIS — Z1389 Encounter for screening for other disorder: Secondary | ICD-10-CM | POA: Diagnosis not present

## 2015-07-10 DIAGNOSIS — D649 Anemia, unspecified: Secondary | ICD-10-CM | POA: Diagnosis not present

## 2015-07-10 DIAGNOSIS — E785 Hyperlipidemia, unspecified: Secondary | ICD-10-CM | POA: Diagnosis not present

## 2015-07-10 DIAGNOSIS — Z6835 Body mass index (BMI) 35.0-35.9, adult: Secondary | ICD-10-CM | POA: Diagnosis not present

## 2015-07-10 DIAGNOSIS — Z23 Encounter for immunization: Secondary | ICD-10-CM | POA: Diagnosis not present

## 2015-07-10 DIAGNOSIS — Z0000 Encounter for general adult medical examination without abnormal findings: Secondary | ICD-10-CM | POA: Diagnosis not present

## 2015-07-10 DIAGNOSIS — R030 Elevated blood-pressure reading, without diagnosis of hypertension: Secondary | ICD-10-CM | POA: Diagnosis not present

## 2015-07-10 DIAGNOSIS — G4733 Obstructive sleep apnea (adult) (pediatric): Secondary | ICD-10-CM | POA: Diagnosis not present

## 2015-07-10 DIAGNOSIS — M25511 Pain in right shoulder: Secondary | ICD-10-CM | POA: Diagnosis not present

## 2015-07-18 DIAGNOSIS — F331 Major depressive disorder, recurrent, moderate: Secondary | ICD-10-CM | POA: Diagnosis not present

## 2015-07-24 DIAGNOSIS — M2140 Flat foot [pes planus] (acquired), unspecified foot: Secondary | ICD-10-CM | POA: Diagnosis not present

## 2015-07-24 DIAGNOSIS — M79645 Pain in left finger(s): Secondary | ICD-10-CM | POA: Diagnosis not present

## 2015-07-24 DIAGNOSIS — F331 Major depressive disorder, recurrent, moderate: Secondary | ICD-10-CM | POA: Diagnosis not present

## 2015-07-31 DIAGNOSIS — F331 Major depressive disorder, recurrent, moderate: Secondary | ICD-10-CM | POA: Diagnosis not present

## 2015-08-01 DIAGNOSIS — G8929 Other chronic pain: Secondary | ICD-10-CM | POA: Insufficient documentation

## 2015-08-01 DIAGNOSIS — M1812 Unilateral primary osteoarthritis of first carpometacarpal joint, left hand: Secondary | ICD-10-CM | POA: Diagnosis not present

## 2015-08-07 DIAGNOSIS — F331 Major depressive disorder, recurrent, moderate: Secondary | ICD-10-CM | POA: Diagnosis not present

## 2015-08-10 HISTORY — PX: DE QUERVAIN'S RELEASE: SHX1439

## 2015-08-10 HISTORY — PX: METACARPOPHALANGEAL JOINT ARTHRODESIS: SUR59

## 2015-08-10 HISTORY — PX: CARPOMETACARPEL SUSPENSION PLASTY: SHX5005

## 2015-08-14 DIAGNOSIS — M19042 Primary osteoarthritis, left hand: Secondary | ICD-10-CM | POA: Diagnosis not present

## 2015-08-14 DIAGNOSIS — M189 Osteoarthritis of first carpometacarpal joint, unspecified: Secondary | ICD-10-CM | POA: Diagnosis not present

## 2015-08-14 DIAGNOSIS — M1812 Unilateral primary osteoarthritis of first carpometacarpal joint, left hand: Secondary | ICD-10-CM | POA: Diagnosis not present

## 2015-08-14 DIAGNOSIS — G8918 Other acute postprocedural pain: Secondary | ICD-10-CM | POA: Diagnosis not present

## 2015-08-14 DIAGNOSIS — M654 Radial styloid tenosynovitis [de Quervain]: Secondary | ICD-10-CM | POA: Diagnosis not present

## 2015-08-16 DIAGNOSIS — M1812 Unilateral primary osteoarthritis of first carpometacarpal joint, left hand: Secondary | ICD-10-CM | POA: Diagnosis not present

## 2015-08-19 DIAGNOSIS — M1812 Unilateral primary osteoarthritis of first carpometacarpal joint, left hand: Secondary | ICD-10-CM | POA: Diagnosis not present

## 2015-08-23 DIAGNOSIS — F331 Major depressive disorder, recurrent, moderate: Secondary | ICD-10-CM | POA: Diagnosis not present

## 2015-08-26 DIAGNOSIS — F330 Major depressive disorder, recurrent, mild: Secondary | ICD-10-CM | POA: Diagnosis not present

## 2015-08-28 DIAGNOSIS — F331 Major depressive disorder, recurrent, moderate: Secondary | ICD-10-CM | POA: Diagnosis not present

## 2015-09-04 DIAGNOSIS — M2140 Flat foot [pes planus] (acquired), unspecified foot: Secondary | ICD-10-CM | POA: Diagnosis not present

## 2015-09-11 DIAGNOSIS — F331 Major depressive disorder, recurrent, moderate: Secondary | ICD-10-CM | POA: Diagnosis not present

## 2015-09-30 DIAGNOSIS — M19142 Post-traumatic osteoarthritis, left hand: Secondary | ICD-10-CM | POA: Diagnosis not present

## 2015-09-30 DIAGNOSIS — M1812 Unilateral primary osteoarthritis of first carpometacarpal joint, left hand: Secondary | ICD-10-CM | POA: Diagnosis not present

## 2015-10-01 DIAGNOSIS — F331 Major depressive disorder, recurrent, moderate: Secondary | ICD-10-CM | POA: Diagnosis not present

## 2015-10-07 ENCOUNTER — Ambulatory Visit (INDEPENDENT_AMBULATORY_CARE_PROVIDER_SITE_OTHER): Payer: Medicare Other | Admitting: Family Medicine

## 2015-10-07 VITALS — BP 136/82 | HR 93 | Temp 98.1°F | Resp 17 | Ht 62.0 in | Wt 202.0 lb

## 2015-10-07 DIAGNOSIS — J0110 Acute frontal sinusitis, unspecified: Secondary | ICD-10-CM | POA: Diagnosis not present

## 2015-10-07 MED ORDER — DOXYCYCLINE HYCLATE 100 MG PO TABS: 100.0000 mg | ORAL_TABLET | Freq: Two times a day (BID) | ORAL | Status: DC

## 2015-10-07 NOTE — Patient Instructions (Signed)
We are going to treat you for a sinus infection with doxycycline. Take it with food and water Let me know if you do not feel better in the next few days- Sooner if worse.

## 2015-10-07 NOTE — Progress Notes (Signed)
[facility] [address], [place] [address] [phone]  Date:  10/07/2015   Name:  Molly Lucas   DOB:  04/20/1946   MRN:  [id number]  PCP:  Donnajean Lopes, MD    Chief Complaint: Cough; Sore Throat; and Nasal Congestion   History of Present Illness:  Molly Lucas is a 69 y.o. very pleasant female patient who presents with the following:  Here today as a new patient with illness for a couple of weeks. She has noted a cough, cold, congestion, and now sinus pressure and pain, ear pain.   She was traveling when this started She has felt tired She has not noted a fever at home She does have some cough and a lot of mucus in her throat and chest She is using mucinex D, flonase No GI symptoms.   Patient Active Problem List   Diagnosis Date Noted  . Recurrent right inguinal hernia 08/15/2013  . Right inguinal hernia 03/14/2013  . Degenerative arthritis of hip 06/24/2012    Past Medical History  Diagnosis Date  . Anxiety   . Depression   . Sleep apnea     cpap does not use, dentist made mouthipiece   . Arthritis   . Hypertension     hx of off medication now.  Marland Kitchen Hx of seasonal allergies   . Constipation   . History of blood transfusion   . Anemia     Takes iron supplement  . Hyperlipemia     Past Surgical History  Procedure Laterality Date  . Wisdom tooth extraction    . Uterine fibroid surgery    . Total hip arthroplasty  06/24/2012    Procedure: TOTAL HIP ARTHROPLASTY ANTERIOR APPROACH;  Surgeon: Mcarthur Rossetti, MD;  Location: [facility];  Service: Orthopedics;  Laterality: Left;  Left total hip arthroplasty, anterior approach  . Hernia repair Left 06/2012  . Inguinal hernia repair Right 05/03/2013    Procedure: HERNIA REPAIR INGUINAL ADULT;  Surgeon: Harl Bowie, MD;  Location: [place];  Service: General;  Laterality: Right;  . Insertion of mesh Right 05/03/2013    Procedure: INSERTION OF MESH;  Surgeon: Harl Bowie,  MD;  Location: [place];  Service: General;  Laterality: Right;  . Tonsillectomy    . Inguinal hernia repair Right 08/23/2013    Procedure: LAPAROSCOPIC INGUINAL HERNIA;  Surgeon: Harl Bowie, MD;  Location: [place];  Service: General;  Laterality: Right;  . Insertion of mesh Right 08/23/2013    Procedure: INSERTION OF MESH;  Surgeon: Harl Bowie, MD;  Location: [place];  Service: General;  Laterality: Right;    Social History  Substance Use Topics  . Smoking status: Never Smoker   . Smokeless tobacco: Never Used  . Alcohol Use: 1.2 oz/week    2 Glasses of wine per week     Comment: occasional glass of wine     History reviewed. No pertinent family history.  Allergies  Allergen Reactions  . Penicillins Other (See Comments)    Tongue swelling     Medication list has been reviewed and updated.  Current Outpatient Prescriptions on File Prior to Visit  Medication Sig Dispense Refill  . aspirin 81 MG chewable tablet Chew 81 mg by mouth daily.    Marland Kitchen CALCIUM PO Take 2 tablets by mouth daily.     . cholecalciferol (VITAMIN D) 1000 UNITS tablet Take 2,000 Units by mouth daily.    . clorazepate (  TRANXENE) 7.5 MG tablet Take 15 mg by mouth at bedtime.     . DULoxetine (CYMBALTA) 60 MG capsule Take 60 mg by mouth at bedtime.    . gabapentin (NEURONTIN) 300 MG capsule Take 300 mg by mouth 2 (two) times daily.    Marland Kitchen glucosamine-chondroitin 500-400 MG tablet Take 1 tablet by mouth 3 (three) times daily.    Marland Kitchen HYDROcodone-acetaminophen (NORCO/VICODIN) 5-325 MG per tablet     . L-Methylfolate-Algae (DEPLIN 15 PO) Take 15 mg by mouth at bedtime.    . Multiple Vitamins-Minerals (HAIR VITAMINS) TABS Take 2 tablets by mouth daily.    . Multiple Vitamins-Minerals (HAIR/SKIN/NAILS) TABS Take 1 tablet by mouth daily.    . multivitamin-lutein (OCUVITE-LUTEIN) CAPS Take 1 capsule by mouth daily.    Marland Kitchen ofloxacin (OCUFLOX) 0.3 % ophthalmic solution     . omega-3 acid ethyl esters (LOVAZA) 1 G  capsule Take 2 g by mouth daily.    . pravastatin (PRAVACHOL) 20 MG tablet Take 20 mg by mouth daily.    . Probiotic Product (PROBIOTIC DAILY PO) Take 1 capsule by mouth daily.    . VESICARE 5 MG tablet      No current facility-administered medications on file prior to visit.    Review of Systems:  As per HPI- otherwise negative.   Physical Examination: Filed Vitals:   10/07/15 1717  BP: 136/82  Pulse: 93  Temp: 98.1 F (36.7 C)  Resp: 17   Filed Vitals:   10/07/15 1717  Height: 5\' 2"  (1.575 m)  Weight: 202 lb (91.627 kg)   Body mass index is 36.94 kg/(m^2). Ideal Body Weight: Weight in (lb) to have BMI = 25: 136.4  GEN: WDWN, NAD, Non-toxic, A & O x 3, obese, looks well HEENT: Atraumatic, Normocephalic. Neck supple. No masses, No LAD.  Bilateral TM wnl, oropharynx normal.  PEERL,EOMI.  Frontal sinus tenderness to palpation Ears and Nose: No external deformity. CV: RRR, No M/G/R. No JVD. No thrill. No extra heart sounds. PULM: CTA B, no wheezes, crackles, rhonchi. No retractions. No resp. distress. No accessory muscle use.- clear EXTR: No c/c/e NEURO Normal gait.  PSYCH: Normally interactive. Conversant. Not depressed or anxious appearing.  Calm demeanor.    Assessment and Plan: Acute non-recurrent frontal sinusitis - Plan: doxycycline (VIBRA-TABS) 100 MG tablet  Treat for a sinus infection with doxycycline.  Continue supportive care as needed Follow-up if not better soon- Sooner if worse.      Signed Lamar Blinks, MD

## 2015-10-18 DIAGNOSIS — F331 Major depressive disorder, recurrent, moderate: Secondary | ICD-10-CM | POA: Diagnosis not present

## 2015-10-28 DIAGNOSIS — M1812 Unilateral primary osteoarthritis of first carpometacarpal joint, left hand: Secondary | ICD-10-CM | POA: Diagnosis not present

## 2015-11-19 DIAGNOSIS — F331 Major depressive disorder, recurrent, moderate: Secondary | ICD-10-CM | POA: Diagnosis not present

## 2015-11-29 ENCOUNTER — Emergency Department (HOSPITAL_COMMUNITY): Payer: Medicare Other

## 2015-11-29 ENCOUNTER — Encounter (HOSPITAL_COMMUNITY): Payer: Self-pay

## 2015-11-29 ENCOUNTER — Inpatient Hospital Stay (HOSPITAL_COMMUNITY)
Admission: EM | Admit: 2015-11-29 | Discharge: 2015-12-01 | DRG: 378 | Disposition: A | Discharge status: Home / Self Care | Payer: Medicare Other | Attending: Internal Medicine | Admitting: Internal Medicine

## 2015-11-29 DIAGNOSIS — D509 Iron deficiency anemia, unspecified: Secondary | ICD-10-CM | POA: Diagnosis present

## 2015-11-29 DIAGNOSIS — K5900 Constipation, unspecified: Secondary | ICD-10-CM | POA: Diagnosis present

## 2015-11-29 DIAGNOSIS — Z7982 Long term (current) use of aspirin: Secondary | ICD-10-CM | POA: Diagnosis not present

## 2015-11-29 DIAGNOSIS — Z96642 Presence of left artificial hip joint: Secondary | ICD-10-CM | POA: Diagnosis present

## 2015-11-29 DIAGNOSIS — K2970 Gastritis, unspecified, without bleeding: Secondary | ICD-10-CM | POA: Diagnosis present

## 2015-11-29 DIAGNOSIS — R1310 Dysphagia, unspecified: Secondary | ICD-10-CM | POA: Diagnosis present

## 2015-11-29 DIAGNOSIS — Z79899 Other long term (current) drug therapy: Secondary | ICD-10-CM

## 2015-11-29 DIAGNOSIS — R404 Transient alteration of awareness: Secondary | ICD-10-CM | POA: Diagnosis not present

## 2015-11-29 DIAGNOSIS — E785 Hyperlipidemia, unspecified: Secondary | ICD-10-CM | POA: Diagnosis present

## 2015-11-29 DIAGNOSIS — G4730 Sleep apnea, unspecified: Secondary | ICD-10-CM | POA: Diagnosis present

## 2015-11-29 DIAGNOSIS — Z880 Allergy status to penicillin: Secondary | ICD-10-CM

## 2015-11-29 DIAGNOSIS — D649 Anemia, unspecified: Secondary | ICD-10-CM | POA: Diagnosis not present

## 2015-11-29 DIAGNOSIS — K2980 Duodenitis without bleeding: Secondary | ICD-10-CM | POA: Diagnosis present

## 2015-11-29 DIAGNOSIS — Z8249 Family history of ischemic heart disease and other diseases of the circulatory system: Secondary | ICD-10-CM

## 2015-11-29 DIAGNOSIS — K219 Gastro-esophageal reflux disease without esophagitis: Secondary | ICD-10-CM | POA: Diagnosis present

## 2015-11-29 DIAGNOSIS — D62 Acute posthemorrhagic anemia: Secondary | ICD-10-CM | POA: Diagnosis present

## 2015-11-29 DIAGNOSIS — K449 Diaphragmatic hernia without obstruction or gangrene: Secondary | ICD-10-CM | POA: Diagnosis present

## 2015-11-29 DIAGNOSIS — R0600 Dyspnea, unspecified: Secondary | ICD-10-CM | POA: Diagnosis not present

## 2015-11-29 DIAGNOSIS — F418 Other specified anxiety disorders: Secondary | ICD-10-CM | POA: Diagnosis present

## 2015-11-29 DIAGNOSIS — R195 Other fecal abnormalities: Secondary | ICD-10-CM | POA: Diagnosis present

## 2015-11-29 DIAGNOSIS — M1990 Unspecified osteoarthritis, unspecified site: Secondary | ICD-10-CM | POA: Diagnosis present

## 2015-11-29 DIAGNOSIS — K259 Gastric ulcer, unspecified as acute or chronic, without hemorrhage or perforation: Secondary | ICD-10-CM | POA: Diagnosis present

## 2015-11-29 DIAGNOSIS — I10 Essential (primary) hypertension: Secondary | ICD-10-CM | POA: Diagnosis present

## 2015-11-29 DIAGNOSIS — I5032 Chronic diastolic (congestive) heart failure: Secondary | ICD-10-CM | POA: Diagnosis present

## 2015-11-29 DIAGNOSIS — R531 Weakness: Secondary | ICD-10-CM

## 2015-11-29 DIAGNOSIS — R0602 Shortness of breath: Secondary | ICD-10-CM | POA: Diagnosis not present

## 2015-11-29 DIAGNOSIS — K921 Melena: Principal | ICD-10-CM | POA: Diagnosis present

## 2015-11-29 DIAGNOSIS — Z6834 Body mass index (BMI) 34.0-34.9, adult: Secondary | ICD-10-CM | POA: Diagnosis not present

## 2015-11-29 DIAGNOSIS — R0609 Other forms of dyspnea: Secondary | ICD-10-CM | POA: Diagnosis not present

## 2015-11-29 LAB — CBC WITH DIFFERENTIAL/PLATELET
BASOS PCT: 1 %
Basophils Absolute: 0 10*3/uL (ref 0.0–0.1)
EOS PCT: 3 %
Eosinophils Absolute: 0.1 10*3/uL (ref 0.0–0.7)
HEMATOCRIT: 17.1 % — AB (ref 36.0–46.0)
Hemoglobin: 4.5 g/dL — CL (ref 12.0–15.0)
Lymphocytes Relative: 32 %
Lymphs Abs: 1.4 10*3/uL (ref 0.7–4.0)
MCH: 18.4 pg — AB (ref 26.0–34.0)
MCHC: 26.3 g/dL — ABNORMAL LOW (ref 30.0–36.0)
MCV: 70.1 fL — AB (ref 78.0–100.0)
Monocytes Absolute: 0.4 10*3/uL (ref 0.1–1.0)
Monocytes Relative: 9 %
Neutro Abs: 2.6 10*3/uL (ref 1.7–7.7)
Neutrophils Relative %: 55 %
Platelets: 237 10*3/uL (ref 150–400)
RBC: 2.44 MIL/uL — ABNORMAL LOW (ref 3.87–5.11)
RDW: 19.3 % — AB (ref 11.5–15.5)
WBC: 4.5 10*3/uL (ref 4.0–10.5)

## 2015-11-29 LAB — COMPREHENSIVE METABOLIC PANEL
ALT: 22 U/L (ref 14–54)
AST: 30 U/L (ref 15–41)
Albumin: 3.8 g/dL (ref 3.5–5.0)
Alkaline Phosphatase: 89 U/L (ref 38–126)
Anion gap: 9 (ref 5–15)
BILIRUBIN TOTAL: 0.9 mg/dL (ref 0.3–1.2)
BUN: 17 mg/dL (ref 6–20)
CHLORIDE: 108 mmol/L (ref 101–111)
CO2: 25 mmol/L (ref 22–32)
CREATININE: 0.77 mg/dL (ref 0.44–1.00)
Calcium: 10 mg/dL (ref 8.9–10.3)
Glucose, Bld: 92 mg/dL (ref 65–99)
POTASSIUM: 4.3 mmol/L (ref 3.5–5.1)
Sodium: 142 mmol/L (ref 135–145)
Total Protein: 6.4 g/dL — ABNORMAL LOW (ref 6.5–8.1)

## 2015-11-29 LAB — I-STAT CHEM 8, ED
BUN: 17 mg/dL (ref 6–20)
CALCIUM ION: 1.38 mmol/L — AB (ref 1.13–1.30)
CREATININE: 0.8 mg/dL (ref 0.44–1.00)
Chloride: 105 mmol/L (ref 101–111)
Glucose, Bld: 86 mg/dL (ref 65–99)
HCT: 16 % — ABNORMAL LOW (ref 36.0–46.0)
Hemoglobin: 5.4 g/dL — CL (ref 12.0–15.0)
Potassium: 4.2 mmol/L (ref 3.5–5.1)
Sodium: 139 mmol/L (ref 135–145)
TCO2: 24 mmol/L (ref 0–100)

## 2015-11-29 LAB — BRAIN NATRIURETIC PEPTIDE: B Natriuretic Peptide: 193.8 pg/mL — ABNORMAL HIGH (ref 0.0–100.0)

## 2015-11-29 LAB — RETICULOCYTES
RBC.: 2.25 MIL/uL — ABNORMAL LOW (ref 3.87–5.11)
RETIC COUNT ABSOLUTE: 67.5 10*3/uL (ref 19.0–186.0)
RETIC CT PCT: 3 % (ref 0.4–3.1)

## 2015-11-29 LAB — IRON AND TIBC
Iron: 15 ug/dL — ABNORMAL LOW (ref 28–170)
SATURATION RATIOS: 3 % — AB (ref 10.4–31.8)
TIBC: 561 ug/dL — AB (ref 250–450)
UIBC: 546 ug/dL

## 2015-11-29 LAB — VITAMIN B12: Vitamin B-12: 839 pg/mL (ref 180–914)

## 2015-11-29 LAB — POC OCCULT BLOOD, ED: Fecal Occult Bld: POSITIVE — AB

## 2015-11-29 LAB — I-STAT TROPONIN, ED: Troponin i, poc: 0 ng/mL (ref 0.00–0.08)

## 2015-11-29 LAB — PREPARE RBC (CROSSMATCH)

## 2015-11-29 LAB — FERRITIN: FERRITIN: 3 ng/mL — AB (ref 11–307)

## 2015-11-29 MED ORDER — DULOXETINE HCL 60 MG PO CPEP
60.0000 mg | ORAL_CAPSULE | Freq: Every day | ORAL | Status: DC
  Administered 2015-11-29 – 2015-11-30 (×2): 60 mg via ORAL
  Filled 2015-11-29 (×2): qty 1

## 2015-11-29 MED ORDER — SODIUM CHLORIDE 0.9 % IJ SOLN
3.0000 mL | Freq: Two times a day (BID) | INTRAMUSCULAR | Status: DC
  Administered 2015-11-30 (×2): 3 mL via INTRAVENOUS

## 2015-11-29 MED ORDER — SODIUM CHLORIDE 0.9 % IV SOLN
Freq: Once | INTRAVENOUS | Status: AC
  Administered 2015-11-29: 21:00:00 via INTRAVENOUS

## 2015-11-29 MED ORDER — CLORAZEPATE DIPOTASSIUM 7.5 MG PO TABS
15.0000 mg | ORAL_TABLET | Freq: Every day | ORAL | Status: DC
  Administered 2015-11-29 – 2015-11-30 (×2): 15 mg via ORAL
  Filled 2015-11-29 (×2): qty 2

## 2015-11-29 MED ORDER — ACETAMINOPHEN 325 MG PO TABS: 650.0000 mg | ORAL_TABLET | Freq: Four times a day (QID) | ORAL | Status: DC | PRN

## 2015-11-29 MED ORDER — PANTOPRAZOLE SODIUM 40 MG IV SOLR
40.0000 mg | Freq: Two times a day (BID) | INTRAVENOUS | Status: DC
  Administered 2015-11-29 – 2015-11-30 (×3): 40 mg via INTRAVENOUS
  Filled 2015-11-29 (×4): qty 40

## 2015-11-29 MED ORDER — ONDANSETRON HCL 4 MG PO TABS: 4.0000 mg | ORAL_TABLET | Freq: Four times a day (QID) | ORAL | Status: DC | PRN

## 2015-11-29 MED ORDER — HYDROCODONE-ACETAMINOPHEN 5-325 MG PO TABS: 1.0000 | ORAL_TABLET | ORAL | Status: DC | PRN

## 2015-11-29 MED ORDER — GABAPENTIN 300 MG PO CAPS
300.0000 mg | ORAL_CAPSULE | Freq: Two times a day (BID) | ORAL | Status: DC
  Administered 2015-11-29 – 2015-11-30 (×3): 300 mg via ORAL
  Filled 2015-11-29 (×4): qty 1

## 2015-11-29 MED ORDER — ACETAMINOPHEN 650 MG RE SUPP: 650.0000 mg | Freq: Four times a day (QID) | RECTAL | Status: DC | PRN

## 2015-11-29 MED ORDER — ACETAMINOPHEN 325 MG PO TABS
650.0000 mg | ORAL_TABLET | Freq: Once | ORAL | Status: AC
  Administered 2015-11-29: 650 mg via ORAL
  Filled 2015-11-29: qty 2

## 2015-11-29 MED ORDER — ONDANSETRON HCL 4 MG/2ML IJ SOLN: 4.0000 mg | Freq: Four times a day (QID) | INTRAMUSCULAR | Status: DC | PRN

## 2015-11-29 MED ORDER — DIPHENHYDRAMINE HCL 25 MG PO CAPS
25.0000 mg | ORAL_CAPSULE | Freq: Once | ORAL | Status: AC
  Administered 2015-11-29: 25 mg via ORAL
  Filled 2015-11-29: qty 1

## 2015-11-29 MED ORDER — PRAVASTATIN SODIUM 20 MG PO TABS
20.0000 mg | ORAL_TABLET | Freq: Every day | ORAL | Status: DC
  Administered 2015-11-29: 20 mg via ORAL
  Filled 2015-11-29 (×4): qty 1

## 2015-11-29 MED ORDER — SODIUM CHLORIDE 0.9 % IV SOLN
Freq: Once | INTRAVENOUS | Status: AC
  Administered 2015-11-29: 19:00:00 via INTRAVENOUS

## 2015-11-29 NOTE — ED Notes (Signed)
Per EMS, pt with to MD today for SOB and weakness.  Found to have Hgb of 4.7.  Pt with chronic anemia.  No s/sx of new bleeding. No respiratory symptoms.  Alert and oriented.  Vitals: 134/64, 78, 98% ra,

## 2015-11-29 NOTE — ED Notes (Signed)
MD Eulis Foster aware of PT abnormal result

## 2015-11-29 NOTE — ED Notes (Signed)
At bedside for blood admin

## 2015-11-29 NOTE — ED Notes (Signed)
Bed: WA17 Expected date:  Expected time:  Means of arrival:  Comments: EMS- Weakness, low hgb

## 2015-11-29 NOTE — ED Provider Notes (Signed)
CSN: [id number]     Arrival date & time 11/29/15  1604 History   First MD Initiated Contact with Patient 11/29/15 1617     Chief Complaint  Patient presents with  . Anemia  . Shortness of Breath     (Consider location/radiation/quality/duration/timing/severity/associated sxs/prior Treatment) Patient is a 70 y.o. female presenting with anemia and shortness of breath. The history is provided by the patient (Patient complains of weakness and shortness of breath).  Anemia This is a new problem. The current episode started more than 1 week ago. The problem occurs constantly. The problem has not changed since onset.Associated symptoms include shortness of breath. Pertinent negatives include no chest pain, no abdominal pain and no headaches. Nothing aggravates the symptoms. Nothing relieves the symptoms.  Shortness of Breath Associated symptoms: no abdominal pain, no chest pain, no cough, no headaches and no rash     Past Medical History  Diagnosis Date  . Anxiety   . Depression   . Sleep apnea     cpap does not use, dentist made mouthipiece   . Arthritis   . Hypertension     hx of off medication now.  Marland Kitchen Hx of seasonal allergies   . Constipation   . History of blood transfusion   . Anemia     Takes iron supplement  . Hyperlipemia    Past Surgical History  Procedure Laterality Date  . Wisdom tooth extraction    . Uterine fibroid surgery    . Total hip arthroplasty  06/24/2012    Procedure: TOTAL HIP ARTHROPLASTY ANTERIOR APPROACH;  Surgeon: Mcarthur Rossetti, MD;  Location: [facility];  Service: Orthopedics;  Laterality: Left;  Left total hip arthroplasty, anterior approach  . Hernia repair Left 06/2012  . Inguinal hernia repair Right 05/03/2013    Procedure: HERNIA REPAIR INGUINAL ADULT;  Surgeon: Harl Bowie, MD;  Location: [place];  Service: General;  Laterality: Right;  . Insertion of mesh Right 05/03/2013    Procedure: INSERTION OF MESH;  Surgeon: Harl Bowie, MD;   Location: [place];  Service: General;  Laterality: Right;  . Tonsillectomy    . Inguinal hernia repair Right 08/23/2013    Procedure: LAPAROSCOPIC INGUINAL HERNIA;  Surgeon: Harl Bowie, MD;  Location: [place];  Service: General;  Laterality: Right;  . Insertion of mesh Right 08/23/2013    Procedure: INSERTION OF MESH;  Surgeon: Harl Bowie, MD;  Location: [place];  Service: General;  Laterality: Right;   History reviewed. No pertinent family history. Social History  Substance Use Topics  . Smoking status: Never Smoker   . Smokeless tobacco: Never Used  . Alcohol Use: 1.2 oz/week    2 Glasses of wine per week     Comment: occasional glass of wine    OB History    No data available     Review of Systems  Constitutional: Negative for appetite change and fatigue.  HENT: Negative for congestion, ear discharge and sinus pressure.   Eyes: Negative for discharge.  Respiratory: Positive for shortness of breath. Negative for cough.   Cardiovascular: Negative for chest pain.  Gastrointestinal: Negative for abdominal pain and diarrhea.  Genitourinary: Negative for frequency and hematuria.  Musculoskeletal: Negative for back pain.  Skin: Negative for rash.  Neurological: Negative for seizures and headaches.  Psychiatric/Behavioral: Negative for hallucinations.      Allergies  Penicillins  Home Medications   Prior to Admission medications   Medication Sig Start Date End Date Taking?  Authorizing Provider  aspirin 81 MG chewable tablet Chew 81 mg by mouth daily.   Yes Historical Provider, MD  CALCIUM PO Take 2 tablets by mouth daily.    Yes Historical Provider, MD  cholecalciferol (VITAMIN D) 1000 UNITS tablet Take 2,000 Units by mouth daily.   Yes Historical Provider, MD  clorazepate (TRANXENE) 7.5 MG tablet Take 15 mg by mouth at bedtime.    Yes Historical Provider, MD  DULoxetine (CYMBALTA) 60 MG capsule Take 60 mg by mouth at bedtime.   Yes Historical Provider, MD   gabapentin (NEURONTIN) 300 MG capsule Take 300 mg by mouth 2 (two) times daily.   Yes Historical Provider, MD  glucosamine-chondroitin 500-400 MG tablet Take 1 tablet by mouth 3 (three) times daily.   Yes Historical Provider, MD  L-Methylfolate-Algae (DEPLIN 15 PO) Take 15 mg by mouth at bedtime.   Yes Historical Provider, MD  Multiple Vitamins-Minerals (HAIR VITAMINS) TABS Take 2 tablets by mouth daily.   Yes Historical Provider, MD  Multiple Vitamins-Minerals (HAIR/SKIN/NAILS) TABS Take 1 tablet by mouth daily.   Yes Historical Provider, MD  multivitamin-lutein (OCUVITE-LUTEIN) CAPS Take 1 capsule by mouth daily.   Yes Historical Provider, MD  omega-3 acid ethyl esters (LOVAZA) 1 G capsule Take 2 g by mouth daily.   Yes Historical Provider, MD  pravastatin (PRAVACHOL) 20 MG tablet Take 20 mg by mouth daily.   Yes Historical Provider, MD  Probiotic Product (PROBIOTIC DAILY PO) Take 1 capsule by mouth 2 (two) times daily.    Yes Historical Provider, MD   BP 121/57 mmHg  Pulse 83  Temp(Src) 98.1 F (36.7 C) (Oral)  Resp 13  SpO2 98% Physical Exam  Constitutional: She is oriented to person, place, and time. She appears well-developed.  HENT:  Head: Normocephalic.  Eyes: Conjunctivae and EOM are normal. No scleral icterus.  Neck: Neck supple. No thyromegaly present.  Cardiovascular: Normal rate and regular rhythm.  Exam reveals no gallop and no friction rub.   No murmur heard. Pulmonary/Chest: No stridor. She has no wheezes. She has no rales. She exhibits no tenderness.  Abdominal: She exhibits no distension. There is no tenderness. There is no rebound.  Genitourinary:  Rectal shows brown stool  Musculoskeletal: Normal range of motion. She exhibits no edema.  Lymphadenopathy:    She has no cervical adenopathy.  Neurological: She is oriented to person, place, and time. She exhibits normal muscle tone. Coordination normal.  Skin: No rash noted. No erythema.  Psychiatric: She has a  normal mood and affect. Her behavior is normal.    ED Course  Procedures (including critical care time) Labs Review Labs Reviewed  CBC WITH DIFFERENTIAL/PLATELET - Abnormal; Notable for the following:    RBC 2.44 (*)    Hemoglobin 4.5 (*)    HCT 17.1 (*)    MCV 70.1 (*)    MCH 18.4 (*)    MCHC 26.3 (*)    RDW 19.3 (*)    All other components within normal limits  COMPREHENSIVE METABOLIC PANEL - Abnormal; Notable for the following:    Total Protein 6.4 (*)    All other components within normal limits  I-STAT CHEM 8, ED - Abnormal; Notable for the following:    Calcium, Ion 1.38 (*)    Hemoglobin 5.4 (*)    HCT 16.0 (*)    All other components within normal limits  POC OCCULT BLOOD, ED - Abnormal; Notable for the following:    Fecal Occult Bld POSITIVE (*)  All other components within normal limits  BRAIN NATRIURETIC PEPTIDE  VITAMIN B12  FOLATE  IRON AND TIBC  FERRITIN  RETICULOCYTES  OCCULT BLOOD X 1 CARD TO LAB, STOOL  I-STAT TROPOININ, ED  TYPE AND SCREEN  PREPARE RBC (CROSSMATCH)    Imaging Review Dg Chest 2 View  11/29/2015  CLINICAL DATA:  Shortness of breath, weakness, chronic anemia EXAM: CHEST  2 VIEW COMPARISON:  06/06/2015 FINDINGS: Cardiomediastinal silhouette is stable. Dextroscoliosis thoracic spine again noted. Hiatal hernia again noted. No acute infiltrate or pleural effusion. No pulmonary edema. Thoracic spine osteopenia. IMPRESSION: No active cardiopulmonary disease. Electronically Signed   By: Lahoma Crocker M.D.   On: 11/29/2015 17:59   I have personally reviewed and evaluated these images and lab results as part of my medical decision-making.   EKG Interpretation   Date/Time:  Friday November 29 2015 16:18:57 EST Ventricular Rate:  78 PR Interval:  183 QRS Duration: 84 QT Interval:  369 QTC Calculation: 420 R Axis:   80 Text Interpretation:  Sinus rhythm Low voltage, precordial leads Confirmed  by Dyane Broberg  MD, Jazon Jipson ([id number]) on 11/29/2015 6:34:25  PM     CRITICAL CARE Performed by: Timothee Gali L Total critical care time: 40 minutes Critical care time was exclusive of separately billable procedures and treating other patients. Critical care was necessary to treat or prevent imminent or life-threatening deterioration. Critical care was time spent personally by me on the following activities: development of treatment plan with patient and/or surrogate as well as nursing, discussions with consultants, evaluation of patient's response to treatment, examination of patient, obtaining history from patient or surrogate, ordering and performing treatments and interventions, ordering and review of laboratory studies, ordering and review of radiographic studies, pulse oximetry and re-evaluation of patient's condition.  MDM   Final diagnoses:  Weakness    Admitted for anemia    Milton Ferguson, MD 11/29/15 [phone]

## 2015-11-29 NOTE — ED Notes (Signed)
MD at bedside unable to collect labs

## 2015-11-29 NOTE — H&P (Signed)
PCP: Donnajean Lopes, MD  Surgery Dr. Ninfa Linden [facility]  Referring provider [place]    Chief Complaint:  fatigue  HPI: Molly Lucas is a 70 y.o. female   has a past medical history of Anxiety; Depression; Sleep apnea; Arthritis; Hypertension; seasonal allergies; Constipation; History of blood transfusion; Anemia; and Hyperlipemia.   Presented with shortness of breath or weakness for the past 2 weeks. This was not associated chest pain no nausea vomiting or fevers no chills. No rash.  Patient was seen today at her primary care office with above-mentioned complaints. Routine blood work showed hemoglobin of 4.7 Patient has prior history of anemia requiring  Feraheme infusion in July 2015. States also required transfusions in the past. Patient does not endorse history of  bright red blood per rectum but reports occasional black stools which she felt was not concerning and did not bring to the attention of her primary care provider. Patient is on daily aspirin. Last hemoglobin from 2014 was 12.9. Back in 2013 her hemoglobin did go down to 9.6. Reports last colonoscopy/endoscopy was 2 years ago it was unremarkable except for hiatal hernia.  Last blood transfusion was with Hip replacement in 2013. She has been told she is anemic in the past and her PCP Dr. Philip Aspen has been following her for this. She forgot to turn in her last Hemoccult cards. She is not taking any iron suplements.   In ER: Repeat blood work showed hemoglobin of 4.5 white blood cell count 4.5 platelets 237 MCV 70.1 she is Hemoccult positive from below although stool appeared to be brown no melena noted. BUN 17 creatinine is 0.7   Patient has history of sleep apnea for which she does not use CPAP she had a mouthpiece done by dentistry instead Hospitalist was called for admission for symptomatic anemia with Hemoccult positive stool likely secondary to slow GI bleed was admitted for blood transfusion and GI  workup  Review of Systems:    Pertinent positives include:  shortness of breath at rest.  dyspnea on exertion, fatigue melena,   Constitutional:  No weight loss, night sweats, Fevers, chills, weight loss  HEENT:  No headaches, Difficulty swallowing,Tooth/dental problems,Sore throat,  No sneezing, itching, ear ache, nasal congestion, post nasal drip,  Cardio-vascular:  No chest pain, Orthopnea, PND, anasarca, dizziness, palpitations.no Bilateral lower extremity swelling  GI:  No heartburn, indigestion, abdominal pain, nausea, vomiting, diarrhea, change in bowel habits, loss of appetite, blood in stool, hematemesis Resp:  No excess mucus, no productive cough, No non-productive cough, No coughing up of blood.No change in color of mucus.No wheezing. Skin:  no rash or lesions. No jaundice GU:  no dysuria, change in color of urine, no urgency or frequency. No straining to urinate.  No flank pain.  Musculoskeletal:  No joint pain or no joint swelling. No decreased range of motion. No back pain.  Psych:  No change in mood or affect. No depression or anxiety. No memory loss.  Neuro: no localizing neurological complaints, no tingling, no weakness, no double vision, no gait abnormality, no slurred speech, no confusion  Otherwise ROS are negative except for above, 10 systems were reviewed  Past Medical History: Past Medical History  Diagnosis Date  . Anxiety   . Depression   . Sleep apnea     cpap does not use, dentist made mouthipiece   . Arthritis   . Hypertension     hx of off medication now.  Marland Kitchen Hx of seasonal allergies   .  Constipation   . History of blood transfusion   . Anemia     Takes iron supplement  . Hyperlipemia    Past Surgical History  Procedure Laterality Date  . Wisdom tooth extraction    . Uterine fibroid surgery    . Total hip arthroplasty  06/24/2012    Procedure: TOTAL HIP ARTHROPLASTY ANTERIOR APPROACH;  Surgeon: Mcarthur Rossetti, MD;  Location: [facility];  Service: Orthopedics;  Laterality: Left;  Left total hip arthroplasty, anterior approach  . Hernia repair Left 06/2012  . Inguinal hernia repair Right 05/03/2013    Procedure: HERNIA REPAIR INGUINAL ADULT;  Surgeon: Harl Bowie, MD;  Location: [facility];  Service: General;  Laterality: Right;  . Insertion of mesh Right 05/03/2013    Procedure: INSERTION OF MESH;  Surgeon: Harl Bowie, MD;  Location: [facility];  Service: General;  Laterality: Right;  . Tonsillectomy    . Inguinal hernia repair Right 08/23/2013    Procedure: LAPAROSCOPIC INGUINAL HERNIA;  Surgeon: Harl Bowie, MD;  Location: [facility];  Service: General;  Laterality: Right;  . Insertion of mesh Right 08/23/2013    Procedure: INSERTION OF MESH;  Surgeon: Harl Bowie, MD;  Location: [place] [facility];  Service: General;  Laterality: Right;     Medications: Prior to Admission medications   Medication Sig Start Date End Date Taking? Authorizing Provider  aspirin 81 MG chewable tablet Chew 81 mg by mouth daily.   Yes Historical Provider, MD  CALCIUM PO Take 2 tablets by mouth daily.    Yes Historical Provider, MD  cholecalciferol (VITAMIN D) 1000 UNITS tablet Take 2,000 Units by mouth daily.   Yes Historical Provider, MD  clorazepate (TRANXENE) 7.5 MG tablet Take 15 mg by mouth at bedtime.    Yes Historical Provider, MD  DULoxetine (CYMBALTA) 60 MG capsule Take 60 mg by mouth at bedtime.   Yes Historical Provider, MD  gabapentin (NEURONTIN) 300 MG capsule Take 300 mg by mouth 2 (two) times daily.   Yes Historical Provider, MD  glucosamine-chondroitin 500-400 MG tablet Take 1 tablet by mouth 3 (three) times daily.   Yes Historical Provider, MD  L-Methylfolate-Algae (DEPLIN 15 PO) Take 15 mg by mouth at bedtime.   Yes Historical Provider, MD  Multiple Vitamins-Minerals (HAIR VITAMINS) TABS Take 2 tablets by mouth daily.   Yes Historical Provider, MD  Multiple Vitamins-Minerals (HAIR/SKIN/NAILS) TABS Take 1 tablet by mouth  daily.   Yes Historical Provider, MD  multivitamin-lutein (OCUVITE-LUTEIN) CAPS Take 1 capsule by mouth daily.   Yes Historical Provider, MD  omega-3 acid ethyl esters (LOVAZA) 1 G capsule Take 2 g by mouth daily.   Yes Historical Provider, MD  pravastatin (PRAVACHOL) 20 MG tablet Take 20 mg by mouth daily.   Yes Historical Provider, MD  Probiotic Product (PROBIOTIC DAILY PO) Take 1 capsule by mouth 2 (two) times daily.    Yes Historical Provider, MD    Allergies:   Allergies  Allergen Reactions  . Penicillins Other (See Comments)    Tongue swelling  Has patient had a PCN reaction causing immediate rash, facial/tongue/throat swelling, SOB or lightheadedness with hypotension: Yes     Social History:  Ambulatory independently   Lives at home alone,    divorced   reports that she has never smoked. She has never used smokeless tobacco. She reports that she drinks about 1.2 oz of alcohol per week. She reports that she does not use illicit drugs.    Family History:  family history includes CAD in her other; Heart failure in her mother.    Physical Exam: Patient Vitals for the past 24 hrs:  BP Temp Temp src Pulse Resp SpO2  11/29/15 1800 121/57 mmHg 98.1 F (36.7 C) Oral 83 13 98 %  11/29/15 1708 126/71 mmHg - - 75 18 99 %  11/29/15 1618 134/72 mmHg 97.6 F (36.4 C) Oral 79 18 100 %    1. General:  in No Acute distress 2. Psychological: Alert and  Oriented 3. Head/ENT:    Dry Mucous Membranes                          Head Non traumatic, neck supple                          Normal  Dentition 4. SKIN:  decreased Skin turgor,  Skin clean Dry and intact no rash 5. Heart: Regular rate and rhythm no Murmur, Rub or gallop 6. Lungs: Clear to auscultation bilaterally, no wheezes or crackles   7. Abdomen: Soft, non-tender, Non distended 8. Lower extremities: no clubbing, cyanosis, trace edema 9. Neurologically Grossly intact, moving all 4 extremities equally 10. MSK: Normal range of  motion Hemoccult positive from below  body mass index is unknown because there is no weight on file.   Labs on Admission:   Results for orders placed or performed during the hospital encounter of 11/29/15 (from the past 24 hour(s))  CBC with Differential/Platelet     Status: Abnormal   Collection Time: 11/29/15  5:02 PM  Result Value Ref Range   WBC 4.5 4.0 - 10.5 K/uL   RBC 2.44 (L) 3.87 - 5.11 MIL/uL   Hemoglobin 4.5 (LL) 12.0 - 15.0 g/dL   HCT 17.1 (L) 36.0 - 46.0 %   MCV 70.1 (L) 78.0 - 100.0 fL   MCH 18.4 (L) 26.0 - 34.0 pg   MCHC 26.3 (L) 30.0 - 36.0 g/dL   RDW 19.3 (H) 11.5 - 15.5 %   Platelets 237 150 - 400 K/uL   Neutrophils Relative % 55 %   Lymphocytes Relative 32 %   Monocytes Relative 9 %   Eosinophils Relative 3 %   Basophils Relative 1 %   Neutro Abs 2.6 1.7 - 7.7 K/uL   Lymphs Abs 1.4 0.7 - 4.0 K/uL   Monocytes Absolute 0.4 0.1 - 1.0 K/uL   Eosinophils Absolute 0.1 0.0 - 0.7 K/uL   Basophils Absolute 0.0 0.0 - 0.1 K/uL   RBC Morphology POLYCHROMASIA PRESENT    Smear Review LARGE PLATELETS PRESENT   Comprehensive metabolic panel     Status: Abnormal   Collection Time: 11/29/15  5:02 PM  Result Value Ref Range   Sodium 142 135 - 145 mmol/L   Potassium 4.3 3.5 - 5.1 mmol/L   Chloride 108 101 - 111 mmol/L   CO2 25 22 - 32 mmol/L   Glucose, Bld 92 65 - 99 mg/dL   BUN 17 6 - 20 mg/dL   Creatinine, Ser 0.77 0.44 - 1.00 mg/dL   Calcium 10.0 8.9 - 10.3 mg/dL   Total Protein 6.4 (L) 6.5 - 8.1 g/dL   Albumin 3.8 3.5 - 5.0 g/dL   AST 30 15 - 41 U/L   ALT 22 14 - 54 U/L   Alkaline Phosphatase 89 38 - 126 U/L   Total Bilirubin 0.9 0.3 - 1.2 mg/dL   GFR calc non  Af Amer >60 >60 mL/min   GFR calc Af Amer >60 >60 mL/min   Anion gap 9 5 - 15  Type and screen     Status: None (Preliminary result)   Collection Time: 11/29/15  5:02 PM  Result Value Ref Range   ABO/RH(D) O POS    Antibody Screen NEG    Sample Expiration 12/02/2015    Unit Number [id number]     Blood Component Type RBC LR PHER2    Unit division 00    Status of Unit ALLOCATED    Transfusion Status OK TO TRANSFUSE    Crossmatch Result Compatible    Unit Number [id number]    Blood Component Type RED CELLS,LR    Unit division 00    Status of Unit ALLOCATED    Transfusion Status OK TO TRANSFUSE    Crossmatch Result Compatible    Unit Number [id number]    Blood Component Type RED CELLS,LR    Unit division 00    Status of Unit ALLOCATED    Transfusion Status OK TO TRANSFUSE    Crossmatch Result Compatible   I-stat troponin, ED     Status: None   Collection Time: 11/29/15  5:16 PM  Result Value Ref Range   Troponin i, poc 0.00 0.00 - 0.08 ng/mL   Comment 3          I-stat chem 8, ed     Status: Abnormal   Collection Time: 11/29/15  5:27 PM  Result Value Ref Range   Sodium 139 135 - 145 mmol/L   Potassium 4.2 3.5 - 5.1 mmol/L   Chloride 105 101 - 111 mmol/L   BUN 17 6 - 20 mg/dL   Creatinine, Ser 0.80 0.44 - 1.00 mg/dL   Glucose, Bld 86 65 - 99 mg/dL   Calcium, Ion 1.38 (H) 1.13 - 1.30 mmol/L   TCO2 24 0 - 100 mmol/L   Hemoglobin 5.4 (LL) 12.0 - 15.0 g/dL   HCT 16.0 (L) 36.0 - 46.0 %   Comment NOTIFIED PHYSICIAN   Prepare RBC     Status: None   Collection Time: 11/29/15  6:00 PM  Result Value Ref Range   Order Confirmation ORDER PROCESSED BY BLOOD BANK   POC occult blood, ED     Status: Abnormal   Collection Time: 11/29/15  6:10 PM  Result Value Ref Range   Fecal Occult Bld POSITIVE (A) NEGATIVE    UA not obtained  No results found for: HGBA1C  CrCl cannot be calculated (Unknown ideal weight.).  BNP (last 3 results) No results for input(s): PROBNP in the last 8760 hours.  Other results:  I have pearsonaly reviewed this: ECG REPORT  Rate: 78  Rhythm: Normal sinus rhythm ST&T Change: No ischemic changes QTC within normal limits  There were no vitals filed for this visit.   Cultures:    Component Value Date/Time   SDES FLUID LEFT HIP  07/05/2012 1230   SPECREQUEST Normal 07/05/2012 1230   CULT NO GROWTH 2 DAYS 07/05/2012 1230   REPTSTATUS 07/08/2012 FINAL 07/05/2012 1230     Radiological Exams on Admission: Dg Chest 2 View  11/29/2015  CLINICAL DATA:  Shortness of breath, weakness, chronic anemia EXAM: CHEST  2 VIEW COMPARISON:  06/06/2015 FINDINGS: Cardiomediastinal silhouette is stable. Dextroscoliosis thoracic spine again noted. Hiatal hernia again noted. No acute infiltrate or pleural effusion. No pulmonary edema. Thoracic spine osteopenia. IMPRESSION: No active cardiopulmonary disease. Electronically Signed   By: Orlean Bradford.D.  On: 11/29/2015 17:59    Chart has been reviewed  Family not at  Bedside    Assessment/Plan 70 year old Female with prior history of anemia here with symptomatic anemia hemoglobin of 4.5 requiring transfusion with Hemoccult positive stools with  history of occasional melena or blood per rectum. Gi aware will see in AM  Present on Admission:   . Symptomatic anemia we'll transfuse 3 units, obtain serial CBC. Provided for possible sources of blood loss suspect upper GI bleed  . Melena suspect slow upper GI bleed,  discussed with the primary GI will see in consult in AM, start Protonix, stop aspirin, nothing by mouth post midnight  . Occult blood in stools most likely upper GI shortness Dyspnea and peripheral edema - unsure for home patient has been in her anemic state obtain echogram to evaluate EF  Prophylaxis: SCD  CODE STATUS:  FULL CODE   as per patient   Disposition:  To home once workup is complete and patient is stable  Other plan as per orders.  I have spent a total of 67 min on this admission case discussed With Dr. Morene Crocker with GI  [place] 11/29/2015, 7:41 PM    [facility]  Pager [phone]   after 2 AM please page floor coverage PA If 7AM-7PM, please contact the day team taking care of the patient  [email or web address]  Password [email or web address]

## 2015-11-30 ENCOUNTER — Encounter (HOSPITAL_COMMUNITY)
Admission: EM | Disposition: A | Discharge status: Home / Self Care | Payer: Self-pay | Source: Home / Self Care | Attending: Internal Medicine

## 2015-11-30 ENCOUNTER — Encounter (HOSPITAL_COMMUNITY): Payer: Self-pay

## 2015-11-30 ENCOUNTER — Inpatient Hospital Stay (HOSPITAL_COMMUNITY): Payer: Medicare Other

## 2015-11-30 DIAGNOSIS — D649 Anemia, unspecified: Secondary | ICD-10-CM

## 2015-11-30 DIAGNOSIS — K921 Melena: Principal | ICD-10-CM

## 2015-11-30 DIAGNOSIS — R195 Other fecal abnormalities: Secondary | ICD-10-CM

## 2015-11-30 HISTORY — PX: ESOPHAGOGASTRODUODENOSCOPY: SHX5428

## 2015-11-30 LAB — COMPREHENSIVE METABOLIC PANEL
ALK PHOS: 81 U/L (ref 38–126)
ALT: 23 U/L (ref 14–54)
ANION GAP: 9 (ref 5–15)
AST: 26 U/L (ref 15–41)
Albumin: 3.3 g/dL — ABNORMAL LOW (ref 3.5–5.0)
BUN: 12 mg/dL (ref 6–20)
CALCIUM: 9.3 mg/dL (ref 8.9–10.3)
CO2: 24 mmol/L (ref 22–32)
Chloride: 110 mmol/L (ref 101–111)
Creatinine, Ser: 0.71 mg/dL (ref 0.44–1.00)
GFR calc non Af Amer: >60 mL/min (ref >60)
Glucose, Bld: 89 mg/dL (ref 65–99)
POTASSIUM: 4.2 mmol/L (ref 3.5–5.1)
SODIUM: 143 mmol/L (ref 135–145)
Total Bilirubin: 1.9 mg/dL — ABNORMAL HIGH (ref 0.3–1.2)
Total Protein: 5.7 g/dL — ABNORMAL LOW (ref 6.5–8.1)

## 2015-11-30 LAB — CBC
HCT: 25.7 % — ABNORMAL LOW (ref 36.0–46.0)
HEMATOCRIT: 28.8 % — AB (ref 36.0–46.0)
Hemoglobin: 7.9 g/dL — ABNORMAL LOW (ref 12.0–15.0)
Hemoglobin: 8.7 g/dL — ABNORMAL LOW (ref 12.0–15.0)
MCH: 23.3 pg — ABNORMAL LOW (ref 26.0–34.0)
MCH: 23.1 pg — ABNORMAL LOW (ref 26.0–34.0)
MCHC: 30.7 g/dL (ref 30.0–36.0)
MCHC: 30.2 g/dL (ref 30.0–36.0)
MCV: 75.8 fL — ABNORMAL LOW (ref 78.0–100.0)
MCV: 76.4 fL — AB (ref 78.0–100.0)
PLATELETS: 207 10*3/uL (ref 150–400)
Platelets: 215 10*3/uL (ref 150–400)
RBC: 3.39 MIL/uL — ABNORMAL LOW (ref 3.87–5.11)
RBC: 3.77 MIL/uL — AB (ref 3.87–5.11)
RDW: 20.6 % — AB (ref 11.5–15.5)
RDW: 21 % — ABNORMAL HIGH (ref 11.5–15.5)
WBC: 3.9 10*3/uL — ABNORMAL LOW (ref 4.0–10.5)
WBC: 5.6 10*3/uL (ref 4.0–10.5)

## 2015-11-30 LAB — MAGNESIUM: MAGNESIUM: 2.1 mg/dL (ref 1.7–2.4)

## 2015-11-30 LAB — PHOSPHORUS: PHOSPHORUS: 3.6 mg/dL (ref 2.5–4.6)

## 2015-11-30 LAB — TSH: TSH: 1.051 u[IU]/mL (ref 0.350–4.500)

## 2015-11-30 LAB — FOLATE: Folate: >80 ng/mL (ref >5.9)

## 2015-11-30 LAB — PREPARE RBC (CROSSMATCH)

## 2015-11-30 SURGERY — EGD (ESOPHAGOGASTRODUODENOSCOPY)
Anesthesia: Moderate Sedation

## 2015-11-30 MED ORDER — FENTANYL CITRATE (PF) 100 MCG/2ML IJ SOLN
INTRAMUSCULAR | Status: DC | PRN
  Administered 2015-11-30 (×2): 25 ug via INTRAVENOUS

## 2015-11-30 MED ORDER — PANTOPRAZOLE SODIUM 40 MG PO TBEC: 40.0000 mg | DELAYED_RELEASE_TABLET | Freq: Two times a day (BID) | ORAL | Status: DC

## 2015-11-30 MED ORDER — SODIUM CHLORIDE 0.9 % IV SOLN
Freq: Once | INTRAVENOUS | Status: AC
  Administered 2015-11-30: 15:00:00 via INTRAVENOUS

## 2015-11-30 MED ORDER — FUROSEMIDE 10 MG/ML IJ SOLN
40.0000 mg | Freq: Once | INTRAMUSCULAR | Status: AC
  Administered 2015-11-30: 40 mg via INTRAVENOUS
  Filled 2015-11-30: qty 4

## 2015-11-30 MED ORDER — MIDAZOLAM HCL 10 MG/2ML IJ SOLN
INTRAMUSCULAR | Status: DC | PRN
  Administered 2015-11-30 (×2): 2 mg via INTRAVENOUS

## 2015-11-30 MED ORDER — FENTANYL CITRATE (PF) 100 MCG/2ML IJ SOLN
INTRAMUSCULAR | Status: AC
  Filled 2015-11-30: qty 2

## 2015-11-30 MED ORDER — FUROSEMIDE 10 MG/ML IJ SOLN: 20.0000 mg | Freq: Once | INTRAMUSCULAR | Status: DC

## 2015-11-30 MED ORDER — MIDAZOLAM HCL 5 MG/ML IJ SOLN
INTRAMUSCULAR | Status: AC
  Filled 2015-11-30: qty 2

## 2015-11-30 NOTE — Op Note (Addendum)
[facility] [place], [id number]   ENDOSCOPY PROCEDURE REPORT  PATIENT: Molly Lucas, Molly Lucas  MR#: [id number] BIRTHDATE: 10-27-1946 , 69  yrs. old GENDER: female ENDOSCOPIST: Richmond Campbell, MD REFERRED BY: PROCEDURE DATE:  12/05/2015 PROCEDURE:  EGD c biopsy ASA CLASS:     2 INDICATIONS:  Anemia MEDICATIONS: see chart TOPICAL ANESTHETIC: Cetacaine  DESCRIPTION OF PROCEDURE: After the risks benefits and alternatives of the procedure were thoroughly explained, informed consent was obtained.  The [place] [id number] endoscope was introduced through the mouth and advanced to the second portion of the duodenum , without limitations.  The instrument was slowly withdrawn as the mucosa was fully examined. Estimated blood loss is zero unless otherwise noted in this procedure report.    Esophagus - normal.  Crisp mucosal Z line at 32 cm.  Large hiatal hernia from 32-38 cm.  Discrete exudate covered ulcer at the GE junction   1 cm.  No stigmata of recent bleeding.  Benign appearing.  Remainder of the hernia was non-inflamed.  Stomach - patchy erythema with minimal erosions in the fundus and antrum.  No ulceration, no malignancy.  No heme in the lumen or stigmata of recent hemorrhage.  Biopsy from angularis and corpus for CLO.  Symmetric pylorus, easily passed.]  Duodenum - patchy  erythema in the bulb.  No erosion or ulceration.   Normal 2nd portion.  Retroflexion confirmed the large hiatal hernia defect.  GE junction ulcer identified and photodocumented. The scope was then withdrawn from the patient and the procedure completed.  COMPLICATIONS: There were no immediate complications.  ENDOSCOPIC IMPRESSION: GE junction ulcer without stigmata of recent hemorrhage. Gastritis - mild.  CLO bx pending. Duodenitis - mild. No evidence of acute or chronic GI bleed. Medium size hiatal hernia.  RECOMMENDATIONS: PPI Rx Treat if Helicobacter + Advance  diet Transfuse F/u with my office to determine PPI treatment, monitor labs, decide on capsule endoscopy Determine fe/tibc level.  Begin iron supplement if indicated. Discharge if tolerating diet, after above recommendations complete. Call if additional assistance needed.   [phone].  REPEAT EXAM: To be determined  eSigned:  Richmond Campbell, MD Dec 05, 2015 1:28 PM Revised: 12-05-15 1:28 PM   CC:  CPT CODES: ICD CODES:  The ICD and CPT codes recommended by this software are interpretations from the data that the clinical staff has captured with the software.  The verification of the translation of this report to the ICD and CPT codes and modifiers is the sole responsibility of the health care institution and practicing physician where this report was generated.  [place]. will not be held responsible for the validity of the ICD and CPT codes included on this report.  AMA assumes no liability for data contained or not contained herein. CPT is a Designer, television/film set of the [employer].  PATIENT NAME:  Molly Lucas, Molly Lucas MR#: [id number]

## 2015-11-30 NOTE — Progress Notes (Signed)
*  PRELIMINARY RESULTS* Echocardiogram 2D Echocardiogram has been performed.  Molly Lucas 11/30/2015, 11:00 AM

## 2015-11-30 NOTE — Consult Note (Signed)
Referring Provider: No ref. provider found Primary Care Physician:  Donnajean Lopes, MD Primary Gastroenterologist:  Dr. Earlean Shawl  Reason for Consultation:  Iron Deficiency Anemia  HPI: Molly Lucas is a 70 y.o. female admitted from her primary MD's office where a Hgb of 4.5 was found.  She has had dyspnea, especially with exertion, over the past several weeks. She has a past history of IDA.  GI evaluation 06/2014 included normal colonoscopy and EGD revealing only a medium size hiatal hernia.  She was to have her CBC and iron levels monitored at 3 month intervals, and if falling, capsule endoscopy was to be completed.  Per her history, iron infusions were administered following her evaluation, with normalization of her iron stores and hemogram.  Subsequent monitoring then occurred at the time of her annual examination.  This past August she was mildly anemic.  No subsequent evaluation or treatment administered.  She offers no symptoms to suggest active GI disease.  Rare passage of black stool, not malodorous or soft, occurring every couple of months.  Mild reflux with belching primarily.  Rare transient dysphagia, no impaction.  Occasional epistaxis.  No hematemesis, hemoptysis, hematuria, or vaginal bleeding.  Daily ASA 81 mg, rare NSAID use.  Good appetite, stable weight.   Past Medical History  Diagnosis Date  . Anxiety   . Depression   . Sleep apnea     cpap does not use, dentist made mouthipiece   . Arthritis   . Hypertension     hx of off medication now.  Marland Kitchen Hx of seasonal allergies   . Constipation   . History of blood transfusion   . Anemia     Takes iron supplement  . Hyperlipemia     Past Surgical History  Procedure Laterality Date  . Wisdom tooth extraction    . Uterine fibroid surgery    . Total hip arthroplasty  06/24/2012    Procedure: TOTAL HIP ARTHROPLASTY ANTERIOR APPROACH;  Surgeon: Mcarthur Rossetti, MD;  Location: [facility];  Service: Orthopedics;  Laterality:  Left;  Left total hip arthroplasty, anterior approach  . Hernia repair Left 06/2012  . Inguinal hernia repair Right 05/03/2013    Procedure: HERNIA REPAIR INGUINAL ADULT;  Surgeon: Harl Bowie, MD;  Location: [place];  Service: General;  Laterality: Right;  . Insertion of mesh Right 05/03/2013    Procedure: INSERTION OF MESH;  Surgeon: Harl Bowie, MD;  Location: [place];  Service: General;  Laterality: Right;  . Tonsillectomy    . Inguinal hernia repair Right 08/23/2013    Procedure: LAPAROSCOPIC INGUINAL HERNIA;  Surgeon: Harl Bowie, MD;  Location: [place];  Service: General;  Laterality: Right;  . Insertion of mesh Right 08/23/2013    Procedure: INSERTION OF MESH;  Surgeon: Harl Bowie, MD;  Location: [place];  Service: General;  Laterality: Right;    Prior to Admission medications   Medication Sig Start Date End Date Taking? Authorizing Provider  aspirin 81 MG chewable tablet Chew 81 mg by mouth daily.   Yes Historical Provider, MD  CALCIUM PO Take 2 tablets by mouth daily.    Yes Historical Provider, MD  cholecalciferol (VITAMIN D) 1000 UNITS tablet Take 2,000 Units by mouth daily.   Yes Historical Provider, MD  clorazepate (TRANXENE) 7.5 MG tablet Take 15 mg by mouth at bedtime.    Yes Historical Provider, MD  DULoxetine (CYMBALTA) 60 MG capsule Take 60 mg by mouth at bedtime.   Yes Historical  Provider, MD  gabapentin (NEURONTIN) 300 MG capsule Take 300 mg by mouth 2 (two) times daily.   Yes Historical Provider, MD  glucosamine-chondroitin 500-400 MG tablet Take 1 tablet by mouth 3 (three) times daily.   Yes Historical Provider, MD  L-Methylfolate-Algae (DEPLIN 15 PO) Take 15 mg by mouth at bedtime.   Yes Historical Provider, MD  Multiple Vitamins-Minerals (HAIR VITAMINS) TABS Take 2 tablets by mouth daily.   Yes Historical Provider, MD  Multiple Vitamins-Minerals (HAIR/SKIN/NAILS) TABS Take 1 tablet by mouth daily.   Yes Historical Provider, MD  multivitamin-lutein  (OCUVITE-LUTEIN) CAPS Take 1 capsule by mouth daily.   Yes Historical Provider, MD  omega-3 acid ethyl esters (LOVAZA) 1 G capsule Take 2 g by mouth daily.   Yes Historical Provider, MD  pravastatin (PRAVACHOL) 20 MG tablet Take 20 mg by mouth daily.   Yes Historical Provider, MD  Probiotic Product (PROBIOTIC DAILY PO) Take 1 capsule by mouth 2 (two) times daily.    Yes Historical Provider, MD    Current Facility-Administered Medications  Medication Dose Route Frequency Provider Last Rate Last Dose  . acetaminophen (TYLENOL) tablet 650 mg  650 mg Oral Q6H PRN Toy Baker, MD       Or  . acetaminophen (TYLENOL) suppository 650 mg  650 mg Rectal Q6H PRN Toy Baker, MD      . clorazepate (TRANXENE) tablet 15 mg  15 mg Oral QHS Toy Baker, MD   15 mg at 11/29/15 2212  . DULoxetine (CYMBALTA) DR capsule 60 mg  60 mg Oral QHS Toy Baker, MD   60 mg at 11/29/15 2212  . gabapentin (NEURONTIN) capsule 300 mg  300 mg Oral BID Toy Baker, MD   300 mg at 11/30/15 0926  . HYDROcodone-acetaminophen (NORCO/VICODIN) 5-325 MG per tablet 1-2 tablet  1-2 tablet Oral Q4H PRN Toy Baker, MD      . ondansetron (ZOFRAN) tablet 4 mg  4 mg Oral Q6H PRN Toy Baker, MD       Or  . ondansetron (ZOFRAN) injection 4 mg  4 mg Intravenous Q6H PRN Toy Baker, MD      . pantoprazole (PROTONIX) injection 40 mg  40 mg Intravenous Q12H Toy Baker, MD   40 mg at 11/30/15 0926  . pravastatin (PRAVACHOL) tablet 20 mg  20 mg Oral Daily Toy Baker, MD   20 mg at 11/29/15 2212  . sodium chloride 0.9 % injection 3 mL  3 mL Intravenous Q12H Toy Baker, MD   3 mL at 11/30/15 1000    Allergies as of 11/29/2015 - Review Complete 11/29/2015  Allergen Reaction Noted  . Penicillins Other (See Comments) 06/13/2012    Family History  Problem Relation Age of Onset  . Heart failure Mother   . CAD Other     Social History   Social History  . Marital  Status: Married    Spouse Name: N/A  . Number of Children: N/A  . Years of Education: N/A   Occupational History  . Not on file.   Social History Main Topics  . Smoking status: Never Smoker   . Smokeless tobacco: Never Used  . Alcohol Use: 1.2 oz/week    2 Glasses of wine per week     Comment: occasional glass of wine   . Drug Use: No  . Sexual Activity: Not on file   Other Topics Concern  . Not on file   Social History Narrative    Review of Systems: Positive for:  Gen: Weakness. Denies any fever, chills, rigors, night sweats, anorexia, fatigue, weakness, malaise, involuntary weight loss, and sleep disorder CV:  Denies chest pain, angina, palpitations, syncope, orthopnea, PND, peripheral edema, and claudication. Resp: Dyspnea.  Denies cough, sputum, wheezing, coughing up blood. GI: Intermittent black stool.  Mild pyrosis, rare dysphagia, no impaction.  Constipation when travelling.  Denies dysphagia, odynophagia, abdominal pain, nausea, vomiting, vomiting blood, jaundice, rectal bleeding,  diarrhea, and fecal incontinence.    GU : Denies urinary burning, blood in urine, urinary frequency, urinary hesitancy, nocturnal urination, and urinary incontinence. MS: Denies joint pain or swelling.  Denies muscle weakness, cramps, atrophy.  Derm: Denies rash, itching, oral ulcerations, hives, unhealing ulcers.  Psych: Denies depression, anxiety, memory loss, suicidal ideation, hallucinations,  and confusion. Heme: Denies bruising, bleeding, and enlarged lymph nodes. Neuro:  Denies any headaches, dizziness, paresthesias. Endo:  Denies any problems with heat or cold intolerance, polydipsia/polyuria, unusual weight gain.  Physical Exam: Vital signs in last 24 hours: Temp:  [97.6 F (36.4 C)-98.9 F (37.2 C)] 97.9 F (36.6 C) (01/21 1012) Pulse Rate:  [66-84] 77 (01/21 1012) Resp:  [11-18] 16 (01/21 1012) BP: (106-144)/(55-77) 109/62 mmHg (01/21 1012) SpO2:  [96 %-100 %] 96 % (01/21  1012) Weight:  [90.357 kg (199 lb 3.2 oz)] 90.357 kg (199 lb 3.2 oz) (01/20 1943) Last BM Date: 11/28/15 General:   Alert,  Well-developed, well-nourished, pleasant and cooperative in NAD Head:  Normocephalic and atraumatic. Eyes:  Sclera clear, no icterus.   Conjunctiva pink.   Mouth:   No ulcerations or lesions.  Oropharynx pink & moist. Neck:   No masses or thyromegaly. Lungs:  Clear throughout to auscultation.   No wheezes, crackles, or rhonchi. No evident respiratory distress. Heart:   Regular rate and rhythm; no murmurs, clicks, rubs,  or gallops. Abdomen:  Soft, nontender, nontympanitic, and nondistended. No masses, hepatosplenomegaly or ventral hernias noted. Normal bowel sounds, without bruits, guarding, or rebound.   Rectal:  Not performed   Msk:   Symmetrical without gross deformities. Pulses:  Normal pulses noted. Extremities:   Without clubbing, cyanosis, or edema. Neurologic:  Alert and coherent;  grossly normal neurologically. Skin:  Intact without significant lesions or rashes. Cervical Nodes:  No significant cervical adenopathy. Psych:   Alert and cooperative. Normal mood and affect.  Intake/Output from previous day: 01/20 0701 - 01/21 0700 In: 1061.7 [Blood:1061.7] Out: 800 [Urine:800] Intake/Output this shift:    Lab Results:  Recent Labs  11/29/15 1702 11/29/15 1727 11/30/15 0719  WBC 4.5  --  3.9*  HGB 4.5* 5.4* 7.9*  HCT 17.1* 16.0* 25.7*  PLT 237  --  207   BMET  Recent Labs  11/29/15 1702 11/29/15 1727 11/30/15 0719  NA 142 139 143  K 4.3 4.2 4.2  CL 108 105 110  CO2 25  --  24  GLUCOSE 92 86 89  BUN 17 17 12   CREATININE 0.77 0.80 0.71  CALCIUM 10.0  --  9.3   LFT  Recent Labs  11/30/15 0719  PROT 5.7*  ALBUMIN 3.3*  AST 26  ALT 23  ALKPHOS 81  BILITOT 1.9*   PT/INR No results for input(s): LABPROT, INR in the last 72 hours.  C-Diff No results  Studies/Results: Dg Chest 2 View  11/29/2015  CLINICAL DATA:  Shortness  of breath, weakness, chronic anemia EXAM: CHEST  2 VIEW COMPARISON:  06/06/2015 FINDINGS: Cardiomediastinal silhouette is stable. Dextroscoliosis thoracic spine again noted. Hiatal hernia again noted. No acute infiltrate  or pleural effusion. No pulmonary edema. Thoracic spine osteopenia. IMPRESSION: No active cardiopulmonary disease. Electronically Signed   By: Lahoma Crocker M.D.   On: 11/29/2015 17:59    Impression: Iron deficiency anemia - profound, correcting with PRBC transfusion.  Received x3 units PRBC thus far with improved Hgb to 7.  EGD to be completed emergently today to be sure no acute GI bleeding source is present.  She has undergone colonoscopy 2005, 2010, and 2015, without finding.  I do not think this needs repeating at present.  Capsule endoscopy is indicated and will be accomplished as an outpatient following discharge.  Plan: 1.  EGD today 2.  Advance diet following EGD 2.  Transfuse an additional 1-2 units PRBC prior to discharge 3.  Discharge to home if EGD is normal, after transfusion complete, and if tolerating diet. 4.  Capsule endoscopy to be arranged through my office after discharge.   LOS: 1 day   Mikaylee Arseneau R  11/30/2015, 11:39 AM

## 2015-11-30 NOTE — Discharge Summary (Addendum)
Physician Discharge Summary  Molly Lucas [id number] DOB: 10-16-1946 DOA: 11/29/2015  PCP: Donnajean Lopes, MD  Admit date: 11/29/2015 Discharge date: 12/01/2015  Time spent: 20 minutes  Recommendations for Outpatient Follow-up:  1. Follow up with PCP in 2-3 weeks 2. Would repeat CBC in 1-2 weeks 3. Follow up with GI as scheduled  4. Please follow up H.pylori studies obtained on 1/21 during EGD and treat if pos  Discharge Diagnoses:  Active Problems:   Anemia   Symptomatic anemia   Melena   Occult blood in stools   Discharge Condition: Stable  Diet recommendation: Regular as tolerated  Filed Weights   11/29/15 1943  Weight: 90.357 kg (199 lb 3.2 oz)    History of present illness:  Please review dictated H and P from 1/20 for details. Briefly, 69yo who presented with shortness of breath or weakness for the past 2 weeks. This was not associated chest pain no nausea vomiting or fevers no chills. No rash. Patient was seen today at her primary care office with above-mentioned complaints. Routine blood work showed hemoglobin of 4.7. Pt reported dark stools. Pt was admitted for further work up.  Hospital Course:  . Acute blood loss anemia secondary to possible UGI bleed:Patient was transfused 3 units of blood with post-transfusion hgb of 7.9. GI was consulted given concerns of UGI bleed. Patient underwent EGD on 1/21 with findings of GE junction ulcer without stigmata of recent hemorrhage, and mild gastritis/duodenitis. GI recs to continue PPI, treat h.pyloi if positive, and to give one more unit of blood given Fe deficiency and hgb<8. Patient was thus given one more unit of blood (total this admission: 4 units). Discussed with GI. Following transfusion, patient is medically stable for close outpatient follow up. . Melena: suspect slow upper GI bleed. Per above. S/p EGD. Holding ASA on discharge until OK to resume  - Likely chronic diastolic CHF- 2d echo done this admission. EF  noted to be normal but with grade 2 diastolic dysfunction. Stable thus far  Prophylaxis: SCD's while admitted  Procedures:  EGD 1/21  Consultations:  GI  Discharge Exam: Filed Vitals:   11/30/15 1305 11/30/15 1310 11/30/15 1315 11/30/15 1330  BP: 143/80  135/86 113/61  Pulse: 80 80 80 89  Temp:    98 F (36.7 C)  TempSrc:      Resp: 14 20 18 17   Height:      Weight:      SpO2: 99% 100% 100% 98%    General: awake, in nad Cardiovascular: regular, s1, s2 Respiratory: normal resp effort, no wheezing  Discharge Instructions     Medication List    STOP taking these medications        aspirin 81 MG chewable tablet      TAKE these medications        CALCIUM PO  Take 2 tablets by mouth daily.     cholecalciferol 1000 units tablet  Commonly known as:  VITAMIN D  Take 2,000 Units by mouth daily.     clorazepate 7.5 MG tablet  Commonly known as:  TRANXENE  Take 15 mg by mouth at bedtime.     DEPLIN 15 PO  Take 15 mg by mouth at bedtime.     DULoxetine 60 MG capsule  Commonly known as:  CYMBALTA  Take 60 mg by mouth at bedtime.     gabapentin 300 MG capsule  Commonly known as:  NEURONTIN  Take 300 mg by mouth 2 (two) times daily.  glucosamine-chondroitin 500-400 MG tablet  Take 1 tablet by mouth 3 (three) times daily.     HAIR/SKIN/NAILS Tabs  Take 1 tablet by mouth daily.     multivitamin-lutein Caps capsule  Take 1 capsule by mouth daily.     HAIR VITAMINS Tabs  Take 2 tablets by mouth daily.     omega-3 acid ethyl esters 1 g capsule  Commonly known as:  LOVAZA  Take 2 g by mouth daily.     pantoprazole 40 MG tablet  Commonly known as:  PROTONIX  Take 1 tablet (40 mg total) by mouth 2 (two) times daily.     pravastatin 20 MG tablet  Commonly known as:  PRAVACHOL  Take 20 mg by mouth daily.     PROBIOTIC DAILY PO  Take 1 capsule by mouth 2 (two) times daily.       Allergies  Allergen Reactions  . Penicillins Other (See Comments)     Tongue swelling  Has patient had a PCN reaction causing immediate rash, facial/tongue/throat swelling, SOB or lightheadedness with hypotension: Yes       The results of significant diagnostics from this hospitalization (including imaging, microbiology, ancillary and laboratory) are listed below for reference.    Significant Diagnostic Studies: Dg Chest 2 View  11/29/2015  CLINICAL DATA:  Shortness of breath, weakness, chronic anemia EXAM: CHEST  2 VIEW COMPARISON:  06/06/2015 FINDINGS: Cardiomediastinal silhouette is stable. Dextroscoliosis thoracic spine again noted. Hiatal hernia again noted. No acute infiltrate or pleural effusion. No pulmonary edema. Thoracic spine osteopenia. IMPRESSION: No active cardiopulmonary disease. Electronically Signed   By: Lahoma Crocker M.D.   On: 11/29/2015 17:59    Microbiology: No results found for this or any previous visit (from the past 240 hour(s)).   Labs: Basic Metabolic Panel:  Recent Labs Lab 11/29/15 1702 11/29/15 1727 11/30/15 0719  NA 142 139 143  K 4.3 4.2 4.2  CL 108 105 110  CO2 25  --  24  GLUCOSE 92 86 89  BUN 17 17 12   CREATININE 0.77 0.80 0.71  CALCIUM 10.0  --  9.3  MG  --   --  2.1  PHOS  --   --  3.6   Liver Function Tests:  Recent Labs Lab 11/29/15 1702 11/30/15 0719  AST 30 26  ALT 22 23  ALKPHOS 89 81  BILITOT 0.9 1.9*  PROT 6.4* 5.7*  ALBUMIN 3.8 3.3*   No results for input(s): LIPASE, AMYLASE in the last 168 hours. No results for input(s): AMMONIA in the last 168 hours. CBC:  Recent Labs Lab 11/29/15 1702 11/29/15 1727 11/30/15 0719  WBC 4.5  --  3.9*  NEUTROABS 2.6  --   --   HGB 4.5* 5.4* 7.9*  HCT 17.1* 16.0* 25.7*  MCV 70.1*  --  75.8*  PLT 237  --  207   Cardiac Enzymes: No results for input(s): CKTOTAL, CKMB, CKMBINDEX, TROPONINI in the last 168 hours. BNP: BNP (last 3 results)  Recent Labs  11/29/15 1702  BNP 193.8*    ProBNP (last 3 results) No results for input(s):  PROBNP in the last 8760 hours.  CBG: No results for input(s): GLUCAP in the last 168 hours.   Signed:  Tonishia Steffy, Orpah Melter  [facility] 11/30/2015, 2:58 PM

## 2015-12-01 LAB — CLOTEST (H. PYLORI), BIOPSY: HELICOBACTER SCREEN: NEGATIVE

## 2015-12-01 NOTE — Progress Notes (Signed)
Utilization review completed.  

## 2015-12-01 NOTE — Progress Notes (Signed)
[facility] PROGRESS NOTE  SUDA WILLS [id number] DOB: 1945-11-30 DOA: 11/29/2015 PCP: Donnajean Lopes, MD  HPI/Brief narrative 70yo who presented with shortness of breath or weakness for the past 2 weeks. This was not associated chest pain no nausea vomiting or fevers no chills. No rash. Patient was seen today at her primary care office with above-mentioned complaints. Routine blood work showed hemoglobin of 4.7. Pt reported dark stools. Pt was admitted for further work up.  Assessment/Plan:  . Acute blood loss anemia secondary to possible UGI bleed:Patient was transfused 3 units of blood with post-transfusion hgb of 7.9. GI was consulted given concerns of UGI bleed. Patient underwent EGD on 1/21 with findings of GE junction ulcer without stigmata of recent hemorrhage, and mild gastritis/duodenitis. GI recs to continue PPI, treat h.pyloi if positive, and to give one more unit of blood given Fe deficiency and hgb<8. Patient was thus given one more unit of blood (total this admission: 4 units). Discussed with GI. Following transfusion, patient is medically stable for close outpatient follow up. Patient was initially planned for d/c on 1/21, however final blood transfusion did not finish until late in the evening . Melena: suspect slow upper GI bleed. Per above. S/p EGD. Holding ASA on discharge until OK to resume  - Likely chronic diastolic CHF- 2d echo done this admission. EF noted to be normal but with grade 2 diastolic dysfunction. Stable thus far  Code Status: Full Family Communication: Pt in room Disposition Plan: D/c today   Consultants:  GI  Procedures:  EGD  Antibiotics: Anti-infectives    None       HPI/Subjective: No complaints  Objective: Filed Vitals:   11/30/15 1745 11/30/15 1800 11/30/15 2100 12/01/15 0606  BP: 94/54 107/63 98/54 129/75  Pulse: 79  79 77  Temp: 97.9 F (36.6 C) 97.9 F (36.6 C) 98.6 F (37 C) 97.9 F (36.6 C)  TempSrc: Oral  Oral Oral Oral  Resp: 18 18 18 18   Height:      Weight:      SpO2: 99% 99% 100% 93%    Intake/Output Summary (Last 24 hours) at 12/01/15 0838 Last data filed at 11/30/15 2100  Gross per 24 hour  Intake    575 ml  Output      0 ml  Net    575 ml   Filed Weights   11/29/15 1943  Weight: 90.357 kg (199 lb 3.2 oz)    Exam:   General:  Awake, in nad  Cardiovascular: regular, s1, s2  Respiratory: normal resp effort, no wheezing  Abdomen: soft,nondistended  Musculoskeletal: perfused, no clubbing   Data Reviewed: Basic Metabolic Panel:  Recent Labs Lab 11/29/15 1702 11/29/15 1727 11/30/15 0719  NA 142 139 143  K 4.3 4.2 4.2  CL 108 105 110  CO2 25  --  24  GLUCOSE 92 86 89  BUN 17 17 12   CREATININE 0.77 0.80 0.71  CALCIUM 10.0  --  9.3  MG  --   --  2.1  PHOS  --   --  3.6   Liver Function Tests:  Recent Labs Lab 11/29/15 1702 11/30/15 0719  AST 30 26  ALT 22 23  ALKPHOS 89 81  BILITOT 0.9 1.9*  PROT 6.4* 5.7*  ALBUMIN 3.8 3.3*   No results for input(s): LIPASE, AMYLASE in the last 168 hours. No results for input(s): AMMONIA in the last 168 hours. CBC:  Recent Labs Lab 11/29/15 1702 11/29/15 1727 11/30/15 0719 11/30/15  1714  WBC 4.5  --  3.9* 5.6  NEUTROABS 2.6  --   --   --   HGB 4.5* 5.4* 7.9* 8.7*  HCT 17.1* 16.0* 25.7* 28.8*  MCV 70.1*  --  75.8* 76.4*  PLT 237  --  207 215   Cardiac Enzymes: No results for input(s): CKTOTAL, CKMB, CKMBINDEX, TROPONINI in the last 168 hours. BNP (last 3 results)  Recent Labs  11/29/15 1702  BNP 193.8*    ProBNP (last 3 results) No results for input(s): PROBNP in the last 8760 hours.  CBG: No results for input(s): GLUCAP in the last 168 hours.  No results found for this or any previous visit (from the past 240 hour(s)).   Studies: Dg Chest 2 View  11/29/2015  CLINICAL DATA:  Shortness of breath, weakness, chronic anemia EXAM: CHEST  2 VIEW COMPARISON:  06/06/2015 FINDINGS:  Cardiomediastinal silhouette is stable. Dextroscoliosis thoracic spine again noted. Hiatal hernia again noted. No acute infiltrate or pleural effusion. No pulmonary edema. Thoracic spine osteopenia. IMPRESSION: No active cardiopulmonary disease. Electronically Signed   By: Lahoma Crocker M.D.   On: 11/29/2015 17:59    Scheduled Meds: . clorazepate  15 mg Oral QHS  . DULoxetine  60 mg Oral QHS  . gabapentin  300 mg Oral BID  . pantoprazole (PROTONIX) IV  40 mg Intravenous Q12H  . pravastatin  20 mg Oral Daily  . sodium chloride  3 mL Intravenous Q12H   Continuous Infusions:   Active Problems:   Anemia   Symptomatic anemia   Melena   Occult blood in stools    Bryana Froemming K  [facility] Pager [phone]. If 7PM-7AM, please contact night-coverage at [email or web address], password [facility] 12/01/2015, 8:38 AM  LOS: 2 days

## 2015-12-02 ENCOUNTER — Encounter (HOSPITAL_COMMUNITY): Payer: Self-pay | Admitting: Gastroenterology

## 2015-12-02 LAB — TYPE AND SCREEN
ABO/RH(D): O POS
ANTIBODY SCREEN: NEGATIVE
UNIT DIVISION: 0
Unit division: 0
Unit division: 0
Unit division: 0

## 2015-12-03 DIAGNOSIS — Z9889 Other specified postprocedural states: Secondary | ICD-10-CM | POA: Diagnosis not present

## 2015-12-03 DIAGNOSIS — M19142 Post-traumatic osteoarthritis, left hand: Secondary | ICD-10-CM | POA: Diagnosis not present

## 2015-12-03 DIAGNOSIS — M1812 Unilateral primary osteoarthritis of first carpometacarpal joint, left hand: Secondary | ICD-10-CM | POA: Diagnosis not present

## 2015-12-04 DIAGNOSIS — F331 Major depressive disorder, recurrent, moderate: Secondary | ICD-10-CM | POA: Diagnosis not present

## 2015-12-17 DIAGNOSIS — K259 Gastric ulcer, unspecified as acute or chronic, without hemorrhage or perforation: Secondary | ICD-10-CM | POA: Diagnosis not present

## 2015-12-17 DIAGNOSIS — D500 Iron deficiency anemia secondary to blood loss (chronic): Secondary | ICD-10-CM | POA: Diagnosis not present

## 2015-12-17 DIAGNOSIS — D509 Iron deficiency anemia, unspecified: Secondary | ICD-10-CM | POA: Diagnosis not present

## 2015-12-18 DIAGNOSIS — F331 Major depressive disorder, recurrent, moderate: Secondary | ICD-10-CM | POA: Diagnosis not present

## 2016-01-01 DIAGNOSIS — F331 Major depressive disorder, recurrent, moderate: Secondary | ICD-10-CM | POA: Diagnosis not present

## 2016-01-08 DIAGNOSIS — F331 Major depressive disorder, recurrent, moderate: Secondary | ICD-10-CM | POA: Diagnosis not present

## 2016-01-22 DIAGNOSIS — F331 Major depressive disorder, recurrent, moderate: Secondary | ICD-10-CM | POA: Diagnosis not present

## 2016-01-29 DIAGNOSIS — F331 Major depressive disorder, recurrent, moderate: Secondary | ICD-10-CM | POA: Diagnosis not present

## 2016-02-05 DIAGNOSIS — F331 Major depressive disorder, recurrent, moderate: Secondary | ICD-10-CM | POA: Diagnosis not present

## 2016-02-10 DIAGNOSIS — D500 Iron deficiency anemia secondary to blood loss (chronic): Secondary | ICD-10-CM | POA: Diagnosis not present

## 2016-02-12 ENCOUNTER — Telehealth: Payer: Self-pay | Admitting: Cardiovascular Disease

## 2016-02-12 DIAGNOSIS — F331 Major depressive disorder, recurrent, moderate: Secondary | ICD-10-CM | POA: Diagnosis not present

## 2016-02-12 DIAGNOSIS — F330 Major depressive disorder, recurrent, mild: Secondary | ICD-10-CM | POA: Diagnosis not present

## 2016-02-12 NOTE — Telephone Encounter (Signed)
Received records from [place] for appointment on 02/24/16 with Dr Claiborne Billings.  Records given to [facility] (medical records) for Dr Evette Georges schedule on 02/24/16. lp

## 2016-02-21 ENCOUNTER — Encounter: Payer: Self-pay | Admitting: *Deleted

## 2016-02-24 ENCOUNTER — Encounter: Payer: Self-pay | Admitting: Cardiovascular Disease

## 2016-02-24 ENCOUNTER — Ambulatory Visit (INDEPENDENT_AMBULATORY_CARE_PROVIDER_SITE_OTHER): Payer: Medicare Other | Admitting: Cardiovascular Disease

## 2016-02-24 VITALS — BP 104/80 | HR 77 | Ht 62.0 in | Wt 196.0 lb

## 2016-02-24 DIAGNOSIS — R0600 Dyspnea, unspecified: Secondary | ICD-10-CM

## 2016-02-24 DIAGNOSIS — I3139 Other pericardial effusion (noninflammatory): Secondary | ICD-10-CM

## 2016-02-24 DIAGNOSIS — R9431 Abnormal electrocardiogram [ECG] [EKG]: Secondary | ICD-10-CM | POA: Diagnosis not present

## 2016-02-24 DIAGNOSIS — I313 Pericardial effusion (noninflammatory): Secondary | ICD-10-CM

## 2016-02-24 DIAGNOSIS — Z79899 Other long term (current) drug therapy: Secondary | ICD-10-CM

## 2016-02-24 DIAGNOSIS — R5383 Other fatigue: Secondary | ICD-10-CM

## 2016-02-24 DIAGNOSIS — I319 Disease of pericardium, unspecified: Secondary | ICD-10-CM

## 2016-02-24 DIAGNOSIS — E785 Hyperlipidemia, unspecified: Secondary | ICD-10-CM

## 2016-02-24 DIAGNOSIS — Z8249 Family history of ischemic heart disease and other diseases of the circulatory system: Secondary | ICD-10-CM

## 2016-02-24 NOTE — Patient Instructions (Signed)
Your physician recommends that you return for lab work.  Your physician has requested that you have a lexiscan myoview. For further information please visit [email or web address]. Please follow instruction sheet, as given.   Your physician recommends that you schedule a follow-up appointment in: 6-8 weeks or sooner if needed.

## 2016-02-26 ENCOUNTER — Encounter: Payer: Self-pay | Admitting: Cardiovascular Disease

## 2016-02-26 DIAGNOSIS — I313 Pericardial effusion (noninflammatory): Secondary | ICD-10-CM | POA: Insufficient documentation

## 2016-02-26 DIAGNOSIS — Z8249 Family history of ischemic heart disease and other diseases of the circulatory system: Secondary | ICD-10-CM | POA: Insufficient documentation

## 2016-02-26 DIAGNOSIS — F331 Major depressive disorder, recurrent, moderate: Secondary | ICD-10-CM | POA: Diagnosis not present

## 2016-02-26 DIAGNOSIS — I3139 Other pericardial effusion (noninflammatory): Secondary | ICD-10-CM | POA: Insufficient documentation

## 2016-02-26 DIAGNOSIS — E785 Hyperlipidemia, unspecified: Secondary | ICD-10-CM | POA: Insufficient documentation

## 2016-02-26 NOTE — Progress Notes (Signed)
Patient ID: CHANCEY CULLINANE, female   DOB: 1946-02-03, 70 y.o.   MRN: [id number]     Primary MD: Dr. Bevelyn Buckles Referring M.D.: Dr. Richmond Campbell  PATIENT PROFILE: KEYLAH DARWISH is a 70 y.o. female who is referred through the courtesy of Dr. Richmond Campbell for evaluation of shortness of breath and fluid around her heart.   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  She was seen in 2015 by Dr. Earlean Shawl because of severe iron deficiency anemia.  At that time an EGD and colonoscopy were negative and iron infusion was required to correct her iron deficiency.  She was recently visiting her son and developed significant shortness of breath and dyspnea on exertion.  She was found to have severe anemia with a hemoglobin of 4.5, which required hospitalization.  She was told of having a gastric ulcer, possibly in the cardia.  She tells me that she received 4 units of packed red blood cells.  With her significant anemia she was unaware of development of any chest tightness.  However, she was short of breath with minimal activity.  She underwent an echo Doppler study in January 2017 which revealed an ejection fraction at 55-60%.  There was suggestion of grade 2 diastolic dysfunction.  Aortic root was mildly dilated.  There was mild to moderate mitral regurgitation and there was mild left atrial dilatation.  Of note, she was found to have a trivial pericardial effusion.  She has been followed by Dr. Earlean Shawl and her hemoglobins have risen such that her most recent laboratory in 02/10/2016 showed a hemoglobin of 14 and a hematocrit of 43.2.  Iron saturation was 20%.  She responded to pantoprazole and had immediate improvement with her shortness of breath following transfusion.  Apparently during her hospital stay there was some concern of CHF.  She presents for evaluation.  She admits to a strong family history for heart disease with her father suffering  his first myocardial infarction at age 86.  She has been diagnosed with sleep apnea in the past and may have transiently use CPAP but most recently has been using an oral appliance.  She previously used to exercise regularly but has not exercised much since her anemia.  Past Medical History  Diagnosis Date  . Anxiety   . Depression   . Sleep apnea     cpap does not use, dentist made mouthipiece   . Arthritis   . Hypertension     hx of off medication now.  Marland Kitchen Hx of seasonal allergies   . Constipation   . History of blood transfusion   . Anemia     Takes iron supplement  . Hyperlipemia   . OA (osteoarthritis) of hip     left  . Deformity of left thumb joint     Z collapse deformity to nondominant left thumb  . Fibroid, uterine   . Chronic pain of left thumb     Past Surgical History  Procedure Laterality Date  . Wisdom tooth extraction    . Uterine fibroid surgery  2007  . Total hip arthroplasty  06/24/2012    Procedure: TOTAL HIP ARTHROPLASTY ANTERIOR APPROACH;  Surgeon: Mcarthur Rossetti, MD;  Location: [facility];  Service: Orthopedics;  Laterality: Left;  Left total hip arthroplasty, anterior approach  . Hernia repair Left 06/2012  . Inguinal hernia repair Right 05/03/2013    Procedure: HERNIA REPAIR INGUINAL ADULT;  Surgeon: Harl Bowie, MD;  Location: [facility];  Service: General;  Laterality: Right;  . Insertion of mesh Right 05/03/2013    Procedure: INSERTION OF MESH;  Surgeon: Harl Bowie, MD;  Location: [facility];  Service: General;  Laterality: Right;  . Tonsillectomy    . Inguinal hernia repair Right 08/23/2013    Procedure: LAPAROSCOPIC INGUINAL HERNIA;  Surgeon: Harl Bowie, MD;  Location: [facility];  Service: General;  Laterality: Right;  . Insertion of mesh Right 08/23/2013    Procedure: INSERTION OF MESH;  Surgeon: Harl Bowie, MD;  Location: [facility];  Service: General;  Laterality: Right;  . Esophagogastroduodenoscopy N/A 11/30/2015    Procedure:  ESOPHAGOGASTRODUODENOSCOPY (EGD);  Surgeon: Richmond Campbell, MD;  Location: [facility];  Service: Endoscopy;  Laterality: N/A;  . Dilatation & currettage/hysteroscopy with resectocope  2007  . Carpometacarpel suspension plasty  08/2015    with abductor pollicis longus tendon transfer and suspensionplasty  . Metacarpophalangeal joint arthrodesis  08/2015    with local bone graft  . De quervain's release  08/2015    Allergies  Allergen Reactions  . Penicillins Other (See Comments)    Tongue swelling  Has patient had a PCN reaction causing immediate rash, facial/tongue/throat swelling, SOB or lightheadedness with hypotension: Yes     Current Outpatient Prescriptions  Medication Sig Dispense Refill  . CALCIUM PO Take 2 tablets by mouth daily.     . cholecalciferol (VITAMIN D) 1000 UNITS tablet Take 2,000 Units by mouth daily.    . clorazepate (TRANXENE) 7.5 MG tablet Take 15 mg by mouth at bedtime.     . DULoxetine (CYMBALTA) 60 MG capsule Take 60 mg by mouth at bedtime.    . gabapentin (NEURONTIN) 300 MG capsule Take 300 mg by mouth 2 (two) times daily.    Marland Kitchen glucosamine-chondroitin 500-400 MG tablet Take 1 tablet by mouth 3 (three) times daily.    Marland Kitchen L-Methylfolate-Algae (DEPLIN 15 PO) Take 15 mg by mouth at bedtime.    . Multiple Vitamins-Minerals (HAIR/SKIN/NAILS) TABS Take 1 tablet by mouth daily.    . multivitamin-lutein (OCUVITE-LUTEIN) CAPS Take 1 capsule by mouth daily.    Marland Kitchen omega-3 acid ethyl esters (LOVAZA) 1 G capsule Take 2 g by mouth daily.    . pravastatin (PRAVACHOL) 20 MG tablet Take 20 mg by mouth daily.    . Probiotic Product (PROBIOTIC DAILY PO) Take 1 capsule by mouth 2 (two) times daily.      No current facility-administered medications for this visit.    Social History   Social History  . Marital Status: Married    Spouse Name: N/A  . Number of Children: N/A  . Years of Education: N/A   Occupational History  . Not on file.   Social History Main Topics    . Smoking status: Never Smoker   . Smokeless tobacco: Never Used  . Alcohol Use: 1.2 oz/week    2 Glasses of wine per week     Comment: occasional glass of wine   . Drug Use: No  . Sexual Activity: Not on file   Other Topics Concern  . Not on file   Social History Narrative   Additional social history is notable in that she was born in [place], [place].  She attended college at [employer] and completed her PhD at [employer].  She was a Automotive engineer at [employer] from [employer]17 -2013.  She is presently divorced.  Family History  Problem Relation Age of Onset  .  Heart failure Mother   . CAD Maternal Grandfather   . Heart failure Maternal Grandfather   . Dementia Maternal Grandfather   . CAD Paternal Grandfather   . CAD Paternal Grandmother   . Osteoporosis Mother   . Arthritis Mother   . Arthritis Sister    Additional social history is notable in that her mother is still living at 72 and has a history of congestive heart failure and prior stroke.  Father died at age 65, but had a heart attack at age 38 and also cancer.  He died in a car accident.  She has 1 brother and 2 sisters.  She has 3 children.   ROS General: Negative; No fevers, chills, or night sweats HEENT: Negative; No changes in vision or hearing, sinus congestion, difficulty swallowing Pulmonary: Negative; No cough, wheezing, shortness of breath, hemoptysis Cardiovascular:  See HPI; No chest pain, presyncope, syncope, palpitations, edema GI: History of ulcer disease with severe iron deficiency anemia GU: Negative; No dysuria, hematuria, or difficulty voiding Musculoskeletal: Negative; no myalgias, joint pain, or weakness Hematologic/Oncologic: Negative; no easy bruising, bleeding Endocrine: Negative; no heat/cold intolerance; no diabetes Neuro: Negative; no changes in balance, headaches Skin: Negative; No rashes or skin lesions Psychiatric: Negative; No behavioral problems, depression Sleep: Positive for  obstructive sleep apnea currently using an oral appliance; No daytime sleepiness, hypersomnolence, bruxism, restless legs, hypnogagnic hallucinations Other comprehensive 14 point system review is negative   Physical Exam BP 104/80 mmHg  Pulse 77  Ht '5\' 2"'  (1.575 m)  Wt 196 lb (88.905 kg)  BMI 35.84 kg/m2  Repeat blood pressure by me was 120/80 supine and 116/76 standing.  Wt Readings from Last 3 Encounters:  02/24/16 196 lb (88.905 kg)  11/29/15 199 lb 3.2 oz (90.357 kg)  10/07/15 202 lb (91.627 kg)   General: Alert, oriented, no distress.  Skin: normal turgor, no rashes, warm and dry HEENT: Normocephalic, atraumatic. Pupils equal round and reactive to light; sclera anicteric; extraocular muscles intact; Fundi Without hemorrhages or exudates Nose without nasal septal hypertrophy Mouth/Parynx benign; Mallinpatti scale 3 Neck: No JVD, no carotid bruits; normal carotid upstroke Lungs: clear to ausculatation and percussion; no wheezing or rales Chest wall: without tenderness to palpitation Heart: PMI not displaced, RRR, s1 s2 normal, 1/6 systolic murmur, no diastolic murmur, no rubs, gallops, thrills, or heaves Abdomen: soft, nontender; no hepatosplenomehaly, BS+; abdominal aorta nontender and not dilated by palpation. Back: no CVA tenderness Pulses 2+ Musculoskeletal: full range of motion, normal strength, no joint deformities Extremities: no clubbing cyanosis or edema, Homan's sign negative  Neurologic: grossly nonfocal; Cranial nerves grossly wnl Psychologic: Normal mood and affect   ECG (independently read by me): Normal sinus rhythm at 77 bpm.  Left axis deviation.  Q wave in lead 3 and nondiagnostic in lead aVF.  LABS:  BMP Latest Ref Rng 11/30/2015 11/29/2015 11/29/2015  Glucose 65 - 99 mg/dL 89 86 92  BUN 6 - 20 mg/dL '12 17 17  ' Creatinine 0.44 - 1.00 mg/dL 0.71 0.80 0.77  Sodium 135 - 145 mmol/L 143 139 142  Potassium 3.5 - 5.1 mmol/L 4.2 4.2 4.3  Chloride 101 - 111  mmol/L 110 105 108  CO2 22 - 32 mmol/L 24 - 25  Calcium 8.9 - 10.3 mg/dL 9.3 - 10.0     Hepatic Function Latest Ref Rng 11/30/2015 11/29/2015 07/24/2011  Total Protein 6.5 - 8.1 g/dL 5.7(L) 6.4(L) 7.4  Albumin 3.5 - 5.0 g/dL 3.3(L) 3.8 4.0  AST 15 - 41  U/L '26 30 20  ' ALT 14 - 54 U/L '23 22 22  ' Alk Phosphatase 38 - 126 U/L 81 89 135(H)  Total Bilirubin 0.3 - 1.2 mg/dL 1.9(H) 0.9 1.8(H)    CBC Latest Ref Rng 11/30/2015 11/30/2015 11/29/2015  WBC 4.0 - 10.5 K/uL 5.6 3.9(L) -  Hemoglobin 12.0 - 15.0 g/dL 8.7(L) 7.9(L) 5.4(LL)  Hematocrit 36.0 - 46.0 % 28.8(L) 25.7(L) 16.0(L)  Platelets 150 - 400 K/uL 215 207 -   Lab Results  Component Value Date   MCV 76.4* 11/30/2015   MCV 75.8* 11/30/2015   MCV 70.1* 11/29/2015   Lab Results  Component Value Date   TSH 1.051 11/30/2015   No results found for: HGBA1C   BNP    Component Value Date/Time   BNP 193.8* 11/29/2015 1702    ProBNP No results found for: PROBNP   Lipid Panel  No results found for: CHOL, TRIG, HDL, CHOLHDL, VLDL, LDLCALC, LDLDIRECT  RADIOLOGY: No results found.   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  He was had recurrent issues with our deficiency anemia.  Her most recent episode was felt most likely due to a gastric ulcer in the cardia. Her hemoglobin had dropped to 4.5 and she was treated with 4 units packed red blood cell.  While she was severely anemic, she did have probable mild diastolic heart failure symptoms which may have been related to high output.  Her echo Doppler study confirms normal systolic function but there is grade 2 diastolic dysfunction.  There is probably mild aortic sclerosis and on echo.  Her peak gradient across her aortic valve was 13 mm.  Her trivial pericardial effusion is insignificant.  On exam today she is without shortness of breath or heart failure symptoms.  There is no friction rub.  Despite her profound anemia when her hemoglobin dropped  under 5 she did not have symptoms of chest tightness.  However, on her ECG today there is a Q wave in lead 3 and a nondiagnostic Q wave in lead aVF.  She is very concerned about family history for CAD.  I have recommended that she undergo a [place] study for further evaluation to assess coronary perfusion.  I also am recommending a follow-up chemistry evaluation, TSH, and lipid studies.  She is on pravastatin 20 mg for hyperlipidemia and omega-3 fatty acids.  I will see her in follow-up of the above studies and further recommendations will be made at that time. Troy Sine, MD, [facility] 02/26/2016 6:54 PM

## 2016-02-28 ENCOUNTER — Telehealth (HOSPITAL_COMMUNITY): Payer: Self-pay

## 2016-02-28 NOTE — Telephone Encounter (Signed)
Encounter complete. 

## 2016-03-03 ENCOUNTER — Telehealth (HOSPITAL_COMMUNITY): Payer: Self-pay

## 2016-03-03 NOTE — Telephone Encounter (Signed)
Encounter complete. 

## 2016-03-04 ENCOUNTER — Inpatient Hospital Stay (HOSPITAL_COMMUNITY): Admission: RE | Admit: 2016-03-04 | Payer: Medicare Other | Source: Ambulatory Visit

## 2016-03-10 ENCOUNTER — Telehealth (HOSPITAL_COMMUNITY): Payer: Self-pay

## 2016-03-10 NOTE — Telephone Encounter (Signed)
Encounter complete. 

## 2016-03-11 DIAGNOSIS — F331 Major depressive disorder, recurrent, moderate: Secondary | ICD-10-CM | POA: Diagnosis not present

## 2016-03-12 ENCOUNTER — Inpatient Hospital Stay (HOSPITAL_COMMUNITY): Admission: RE | Admit: 2016-03-12 | Payer: Medicare Other | Source: Ambulatory Visit

## 2016-03-18 ENCOUNTER — Ambulatory Visit (HOSPITAL_COMMUNITY)
Admission: RE | Admit: 2016-03-18 | Discharge: 2016-03-18 | Disposition: A | Discharge status: Home / Self Care | Payer: Medicare Other | Source: Ambulatory Visit | Attending: Cardiovascular Disease | Admitting: Cardiovascular Disease

## 2016-03-18 DIAGNOSIS — E669 Obesity, unspecified: Secondary | ICD-10-CM | POA: Diagnosis not present

## 2016-03-18 DIAGNOSIS — R0609 Other forms of dyspnea: Secondary | ICD-10-CM | POA: Diagnosis not present

## 2016-03-18 DIAGNOSIS — R9431 Abnormal electrocardiogram [ECG] [EKG]: Secondary | ICD-10-CM | POA: Insufficient documentation

## 2016-03-18 DIAGNOSIS — R9439 Abnormal result of other cardiovascular function study: Secondary | ICD-10-CM | POA: Insufficient documentation

## 2016-03-18 DIAGNOSIS — R5383 Other fatigue: Secondary | ICD-10-CM | POA: Diagnosis not present

## 2016-03-18 DIAGNOSIS — R0602 Shortness of breath: Secondary | ICD-10-CM | POA: Insufficient documentation

## 2016-03-18 DIAGNOSIS — Z8249 Family history of ischemic heart disease and other diseases of the circulatory system: Secondary | ICD-10-CM | POA: Insufficient documentation

## 2016-03-18 DIAGNOSIS — Z6835 Body mass index (BMI) 35.0-35.9, adult: Secondary | ICD-10-CM | POA: Insufficient documentation

## 2016-03-18 DIAGNOSIS — I10 Essential (primary) hypertension: Secondary | ICD-10-CM | POA: Diagnosis not present

## 2016-03-18 DIAGNOSIS — G4733 Obstructive sleep apnea (adult) (pediatric): Secondary | ICD-10-CM | POA: Insufficient documentation

## 2016-03-18 DIAGNOSIS — F331 Major depressive disorder, recurrent, moderate: Secondary | ICD-10-CM | POA: Diagnosis not present

## 2016-03-18 LAB — MYOCARDIAL PERFUSION IMAGING
CHL CUP NUCLEAR SDS: 3
CHL CUP NUCLEAR SRS: 8
CHL CUP NUCLEAR SSS: 11
LV sys vol: 24 mL
LVDIAVOL: 71 mL (ref 46–106)
Peak HR: 83 {beats}/min
Rest HR: 69 {beats}/min
TID: 1.4

## 2016-03-18 MED ORDER — TECHNETIUM TC 99M SESTAMIBI GENERIC - CARDIOLITE
10.2000 | Freq: Once | INTRAVENOUS | Status: AC | PRN
  Administered 2016-03-18: 10.2 via INTRAVENOUS

## 2016-03-18 MED ORDER — TECHNETIUM TC 99M SESTAMIBI GENERIC - CARDIOLITE
30.1000 | Freq: Once | INTRAVENOUS | Status: AC | PRN
  Administered 2016-03-18: 30.1 via INTRAVENOUS

## 2016-03-18 MED ORDER — REGADENOSON 0.4 MG/5ML IV SOLN
0.4000 mg | Freq: Once | INTRAVENOUS | Status: AC
  Administered 2016-03-18: 0.4 mg via INTRAVENOUS

## 2016-03-25 DIAGNOSIS — F331 Major depressive disorder, recurrent, moderate: Secondary | ICD-10-CM | POA: Diagnosis not present

## 2016-03-26 ENCOUNTER — Encounter (HOSPITAL_COMMUNITY): Payer: Medicare Other

## 2016-04-01 DIAGNOSIS — F331 Major depressive disorder, recurrent, moderate: Secondary | ICD-10-CM | POA: Diagnosis not present

## 2016-04-02 DIAGNOSIS — Z79899 Other long term (current) drug therapy: Secondary | ICD-10-CM | POA: Diagnosis not present

## 2016-04-02 DIAGNOSIS — E785 Hyperlipidemia, unspecified: Secondary | ICD-10-CM | POA: Diagnosis not present

## 2016-04-02 DIAGNOSIS — R5383 Other fatigue: Secondary | ICD-10-CM | POA: Diagnosis not present

## 2016-04-02 LAB — TSH: TSH: 1.04 mIU/L

## 2016-04-02 LAB — COMPREHENSIVE METABOLIC PANEL
ALBUMIN: 4 g/dL (ref 3.6–5.1)
ALK PHOS: 88 U/L (ref 33–130)
ALT: 23 U/L (ref 6–29)
AST: 23 U/L (ref 10–35)
BILIRUBIN TOTAL: 0.8 mg/dL (ref 0.2–1.2)
BUN: 13 mg/dL (ref 7–25)
CALCIUM: 10.2 mg/dL (ref 8.6–10.4)
CO2: 29 mmol/L (ref 20–31)
CREATININE: 0.8 mg/dL (ref 0.60–0.93)
Chloride: 106 mmol/L (ref 98–110)
Glucose, Bld: 89 mg/dL (ref 65–99)
Potassium: 4.5 mmol/L (ref 3.5–5.3)
SODIUM: 143 mmol/L (ref 135–146)
TOTAL PROTEIN: 6.4 g/dL (ref 6.1–8.1)

## 2016-04-02 LAB — LIPID PANEL
CHOLESTEROL: 178 mg/dL (ref 125–200)
HDL: 89 mg/dL (ref >=46)
LDL CALC: 72 mg/dL (ref <130)
TRIGLYCERIDES: 84 mg/dL (ref <150)
Total CHOL/HDL Ratio: 2 Ratio (ref <=5.0)
VLDL: 17 mg/dL (ref <30)

## 2016-04-07 ENCOUNTER — Encounter: Payer: Self-pay | Admitting: Cardiovascular Disease

## 2016-04-07 ENCOUNTER — Ambulatory Visit (INDEPENDENT_AMBULATORY_CARE_PROVIDER_SITE_OTHER): Payer: Medicare Other | Admitting: Cardiovascular Disease

## 2016-04-07 VITALS — BP 140/88 | HR 83 | Ht 62.5 in | Wt 194.8 lb

## 2016-04-07 DIAGNOSIS — Z8249 Family history of ischemic heart disease and other diseases of the circulatory system: Secondary | ICD-10-CM | POA: Diagnosis not present

## 2016-04-07 DIAGNOSIS — E785 Hyperlipidemia, unspecified: Secondary | ICD-10-CM

## 2016-04-07 DIAGNOSIS — D509 Iron deficiency anemia, unspecified: Secondary | ICD-10-CM | POA: Diagnosis not present

## 2016-04-07 NOTE — Patient Instructions (Signed)
Your physician wants you to follow-up in: 6 months or sooner if needed. You will receive a reminder letter in the mail two months in advance. If you don't receive a letter, please call our office to schedule the follow-up appointment.   If you need a refill on your cardiac medications before your next appointment, please call your pharmacy. 

## 2016-04-08 DIAGNOSIS — F331 Major depressive disorder, recurrent, moderate: Secondary | ICD-10-CM | POA: Diagnosis not present

## 2016-04-09 ENCOUNTER — Encounter: Payer: Self-pay | Admitting: Cardiovascular Disease

## 2016-04-09 DIAGNOSIS — D509 Iron deficiency anemia, unspecified: Secondary | ICD-10-CM | POA: Insufficient documentation

## 2016-04-09 NOTE — Progress Notes (Signed)
Patient ID: Molly Lucas, female   DOB: 03-21-1946, 70 y.o.   MRN: [id number]     Primary MD: Dr. Bevelyn Buckles Referring M.D.: Dr. Richmond Campbell  PATIENT PROFILE: Molly Lucas is a 70 y.o. female who is referred through the courtesy of Dr. Richmond Campbell for evaluation of shortness of breath and fluid around her heart.  I saw her for initial evaluation 6 weeks ago and she presents now for follow-up evaluation.   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  She was seen in 2015 by Dr. Earlean Shawl because of severe iron deficiency anemia.  At that time an EGD and colonoscopy were negative and iron infusion was required to correct her iron deficiency.  She was recently visiting her son and developed significant shortness of breath and dyspnea on exertion.  She was found to have severe anemia with a hemoglobin of 4.5, which required hospitalization.  She was told of having a gastric ulcer, possibly in the cardia.  She tells me that she received 4 units of packed red blood cells.  With her significant anemia she was unaware of development of any chest tightness.  However, she was short of breath with minimal activity.  She underwent an echo Doppler study in January 2017 which revealed an ejection fraction at 55-60%.  There was suggestion of grade 2 diastolic dysfunction.  Aortic root was mildly dilated.  There was mild to moderate mitral regurgitation and there was mild left atrial dilatation.  Of note, she was found to have a trivial pericardial effusion.  She has been followed by Dr. Earlean Shawl and her hemoglobins have risen such that her most recent laboratory in 02/10/2016 showed a hemoglobin of 14 and a hematocrit of 43.2.  Iron saturation was 20%.  She responded to pantoprazole and had immediate improvement with her shortness of breath following transfusion.  Apparently during her hospital stay there was some concern of CHF. Marland Kitchen  She admits to a  strong family history for heart disease with her father suffering his first myocardial infarction at age 25.  She has been diagnosed with sleep apnea in the past and may have transiently use CPAP but most recently has been using an oral appliance.  She previously used to exercise regularly but has not exercised much since her anemia.  Since I saw her, she underwent a Lexiscan gated SPECT myocardial perfusion study on 03/18/2016.  This was a low risk study and showed a defect in the inferior, inferolateral wall.  There was no ischemia.  This defect was interpreted as possibly due to consistent prior infarct.  However, by my review, this most likely is diaphragmatic attenuation rather than prior infarction.  She did not have any ECG changes or arrhythmias.  Post-rest ejection fraction was 65%.  She denies any exertionally precipitated chest pain.  Since I saw her, her mother passed away on 2 days later on 02/26/2016.  The patient continues to take iron supplementation for previous iron deficiency anemia and had follow-up laboratory which I reviewed with her.  She presents for follow-up evaluation  Past Medical History  Diagnosis Date  . Anxiety   . Depression   . Sleep apnea     cpap does not use, dentist made mouthipiece   . Arthritis   . Hypertension     hx of off medication now.  Marland Kitchen Hx of seasonal allergies   . Constipation   . History of  blood transfusion   . Anemia     Takes iron supplement  . Hyperlipemia   . OA (osteoarthritis) of hip     left  . Deformity of left thumb joint     Z collapse deformity to nondominant left thumb  . Fibroid, uterine   . Chronic pain of left thumb     Past Surgical History  Procedure Laterality Date  . Wisdom tooth extraction    . Uterine fibroid surgery  2007  . Total hip arthroplasty  06/24/2012    Procedure: TOTAL HIP ARTHROPLASTY ANTERIOR APPROACH;  Surgeon: Mcarthur Rossetti, MD;  Location: [facility];  Service: Orthopedics;  Laterality: Left;   Left total hip arthroplasty, anterior approach  . Hernia repair Left 06/2012  . Inguinal hernia repair Right 05/03/2013    Procedure: HERNIA REPAIR INGUINAL ADULT;  Surgeon: Harl Bowie, MD;  Location: [facility];  Service: General;  Laterality: Right;  . Insertion of mesh Right 05/03/2013    Procedure: INSERTION OF MESH;  Surgeon: Harl Bowie, MD;  Location: [facility];  Service: General;  Laterality: Right;  . Tonsillectomy    . Inguinal hernia repair Right 08/23/2013    Procedure: LAPAROSCOPIC INGUINAL HERNIA;  Surgeon: Harl Bowie, MD;  Location: [facility];  Service: General;  Laterality: Right;  . Insertion of mesh Right 08/23/2013    Procedure: INSERTION OF MESH;  Surgeon: Harl Bowie, MD;  Location: [facility];  Service: General;  Laterality: Right;  . Esophagogastroduodenoscopy N/A 11/30/2015    Procedure: ESOPHAGOGASTRODUODENOSCOPY (EGD);  Surgeon: Richmond Campbell, MD;  Location: [facility];  Service: Endoscopy;  Laterality: N/A;  . Dilatation & currettage/hysteroscopy with resectocope  2007  . Carpometacarpel suspension plasty  08/2015    with abductor pollicis longus tendon transfer and suspensionplasty  . Metacarpophalangeal joint arthrodesis  08/2015    with local bone graft  . De quervain's release  08/2015    Allergies  Allergen Reactions  . Penicillins Other (See Comments)    Tongue swelling  Has patient had a PCN reaction causing immediate rash, facial/tongue/throat swelling, SOB or lightheadedness with hypotension: Yes     Current Outpatient Prescriptions  Medication Sig Dispense Refill  . CALCIUM PO Take 2 tablets by mouth daily.     . cholecalciferol (VITAMIN D) 1000 UNITS tablet Take 2,000 Units by mouth daily.    . clorazepate (TRANXENE) 7.5 MG tablet Take 15 mg by mouth at bedtime.     . DULoxetine (CYMBALTA) 60 MG capsule Take 60 mg by mouth at bedtime.    . fluticasone (FLONASE) 50 MCG/ACT nasal spray Place 2 sprays into both nostrils daily.    Marland Kitchen  gabapentin (NEURONTIN) 300 MG capsule Take 300 mg by mouth 2 (two) times daily.    Marland Kitchen glucosamine-chondroitin 500-400 MG tablet Take 1 tablet by mouth 3 (three) times daily.    Marland Kitchen L-Methylfolate-Algae (DEPLIN 15 PO) Take 15 mg by mouth at bedtime.    . Multiple Vitamins-Minerals (HAIR/SKIN/NAILS) TABS Take 1 tablet by mouth daily.    . multivitamin-lutein (OCUVITE-LUTEIN) CAPS Take 1 capsule by mouth daily.    Marland Kitchen omega-3 acid ethyl esters (LOVAZA) 1 G capsule Take 2 g by mouth daily.    . pravastatin (PRAVACHOL) 20 MG tablet Take 20 mg by mouth daily.    . Probiotic Product (PROBIOTIC DAILY PO) Take 1 capsule by mouth 2 (two) times daily.      No current facility-administered medications for this visit.    Social History  Social History  . Marital Status: Married    Spouse Name: N/A  . Number of Children: N/A  . Years of Education: N/A   Occupational History  . Not on file.   Social History Main Topics  . Smoking status: Never Smoker   . Smokeless tobacco: Never Used  . Alcohol Use: 1.2 oz/week    2 Glasses of wine per week     Comment: occasional glass of wine   . Drug Use: No  . Sexual Activity: Not on file   Other Topics Concern  . Not on file   Social History Narrative   Additional social history is notable in that she was born in [place], [place].  She attended college at [employer] and completed her PhD at [employer].  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  She is presently divorced.  Family History  Problem Relation Age of Onset  . Heart failure Mother   . CAD Maternal Grandfather   . Heart failure Maternal Grandfather   . Dementia Maternal Grandfather   . CAD Paternal Grandfather   . CAD Paternal Grandmother   . Osteoporosis Mother   . Arthritis Mother   . Arthritis Sister    Additional social history is notable in that her mother recently died at age 40; she had a history of congestive heart failure and prior stroke.  Father died at age  57, but had a heart attack at age 20 and also cancer.  He died in a car accident.  She has 1 brother and 2 sisters.  She has 3 children.   ROS General: Negative; No fevers, chills, or night sweats HEENT: Negative; No changes in vision or hearing, sinus congestion, difficulty swallowing Pulmonary: Negative; No cough, wheezing, shortness of breath, hemoptysis Cardiovascular:  See HPI; No chest pain, presyncope, syncope, palpitations, edema GI: History of ulcer disease with severe iron deficiency anemia GU: Negative; No dysuria, hematuria, or difficulty voiding Musculoskeletal: Negative; no myalgias, joint pain, or weakness Hematologic/Oncologic: Negative; no easy bruising, bleeding Endocrine: Negative; no heat/cold intolerance; no diabetes Neuro: Negative; no changes in balance, headaches Skin: Negative; No rashes or skin lesions Psychiatric: Negative; No behavioral problems, depression Sleep: Positive for obstructive sleep apnea currently using an oral appliance; No daytime sleepiness, hypersomnolence, bruxism, restless legs, hypnogagnic hallucinations Other comprehensive 14 point system review is negative   Physical Exam BP 140/88 mmHg  Pulse 83  Ht 5' 2.5" (1.588 m)  Wt 194 lb 12.8 oz (88.361 kg)  BMI 35.04 kg/m2  Repeat blood pressure by me was 124/80.  Wt Readings from Last 3 Encounters:  04/07/16 194 lb 12.8 oz (88.361 kg)  03/18/16 196 lb (88.905 kg)  02/24/16 196 lb (88.905 kg)   General: Alert, oriented, no distress.  Skin: normal turgor, no rashes, warm and dry HEENT: Normocephalic, atraumatic. Pupils equal round and reactive to light; sclera anicteric; extraocular muscles intact; Fundi Without hemorrhages or exudates Nose without nasal septal hypertrophy Mouth/Parynx benign; Mallinpatti scale 3 Neck: No JVD, no carotid bruits; normal carotid upstroke Lungs: clear to ausculatation and percussion; no wheezing or rales Chest wall: without tenderness to  palpitation Heart: PMI not displaced, RRR, s1 s2 normal, 1/6 systolic murmur, no diastolic murmur, no rubs, gallops, thrills, or heaves Abdomen: soft, nontender; no hepatosplenomehaly, BS+; abdominal aorta nontender and not dilated by palpation. Back: no CVA tenderness Pulses 2+ Musculoskeletal: full range of motion, normal strength, no joint deformities Extremities: no clubbing cyanosis or edema, Homan's sign negative  Neurologic: grossly nonfocal; Cranial nerves grossly wnl Psychologic: Normal mood and affect  No ECG was done today in light of her recent nuclear stress test  April 2017 ECG (independently read by me): Normal sinus rhythm at 77 bpm.  Left axis deviation.  Q wave in lead 3 and nondiagnostic in lead aVF.  LABS:  BMP Latest Ref Rng 04/02/2016 11/30/2015 11/29/2015  Glucose 65 - 99 mg/dL 89 89 86  BUN 7 - 25 mg/dL '13 12 17  ' Creatinine 0.60 - 0.93 mg/dL 0.80 0.71 0.80  Sodium 135 - 146 mmol/L 143 143 139  Potassium 3.5 - 5.3 mmol/L 4.5 4.2 4.2  Chloride 98 - 110 mmol/L 106 110 105  CO2 20 - 31 mmol/L 29 24 -  Calcium 8.6 - 10.4 mg/dL 10.2 9.3 -     Hepatic Function Latest Ref Rng 04/02/2016 11/30/2015 11/29/2015  Total Protein 6.1 - 8.1 g/dL 6.4 5.7(L) 6.4(L)  Albumin 3.6 - 5.1 g/dL 4.0 3.3(L) 3.8  AST 10 - 35 U/L '23 26 30  ' ALT 6 - 29 U/L '23 23 22  ' Alk Phosphatase 33 - 130 U/L 88 81 89  Total Bilirubin 0.2 - 1.2 mg/dL 0.8 1.9(H) 0.9    CBC Latest Ref Rng 11/30/2015 11/30/2015 11/29/2015  WBC 4.0 - 10.5 K/uL 5.6 3.9(L) -  Hemoglobin 12.0 - 15.0 g/dL 8.7(L) 7.9(L) 5.4(LL)  Hematocrit 36.0 - 46.0 % 28.8(L) 25.7(L) 16.0(L)  Platelets 150 - 400 K/uL 215 207 -   Lab Results  Component Value Date   MCV 76.4* 11/30/2015   MCV 75.8* 11/30/2015   MCV 70.1* 11/29/2015   Lab Results  Component Value Date   TSH 1.04 04/02/2016   No results found for: HGBA1C   BNP    Component Value Date/Time   BNP 193.8* 11/29/2015 1702    ProBNP No results found for:  PROBNP   Lipid Panel     Component Value Date/Time   CHOL 178 04/02/2016 0945   TRIG 84 04/02/2016 0945   HDL 89 04/02/2016 0945   CHOLHDL 2.0 04/02/2016 0945   VLDL 17 04/02/2016 0945   LDLCALC 72 04/02/2016 0945    RADIOLOGY: No results found.   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  Her most recent episode was felt most likely due to a gastric ulcer in the cardia. Her hemoglobin had dropped to 4.5 and she was treated with 4 units packed red blood cell.  While she was severely anemic, she had probable mild diastolic heart failure symptoms which may have been related to high output.  Her echo Doppler study has confirmed normal systolic function but there is grade 2 diastolic dysfunction.  There is probably mild aortic sclerosis and on echo.  Her peak gradient across her aortic valve was 13 mm.  Her trivial pericardial effusion is insignificant.  On exam today she is without shortness of breath or heart failure symptoms.  There is no friction rub.  Despite her profound anemia when her hemoglobin dropped under 5 she did not have symptoms of chest tightness.  I reviewed her most recent nuclear stress test.  This revealed a mild inferior defect which can be due to nontransmural scar versus diaphragmatic attenuation.  Importantly, there was no ischemia in the test was interpreted as low risk study.  She did not have any wall motion abnormality and post stress ejection fraction was 65%.  She is on pravastatin 20  mg for hyperlipidemia and omega-3 fatty acids.  Her most recent lipid studies are good with a total cholesterol 178 and LDL cholesterol 72, with an excellent HDL cholesterol 89.  She does have moderate obesity with a body mass index of 35.04.  Weight loss was discussed.  As long as she remains stable, I will see her in one year for cardiology reevaluation.  Troy Sine, MD,  [facility] 04/09/2016 10:56 PM

## 2016-04-15 DIAGNOSIS — F331 Major depressive disorder, recurrent, moderate: Secondary | ICD-10-CM | POA: Diagnosis not present

## 2016-05-13 DIAGNOSIS — F330 Major depressive disorder, recurrent, mild: Secondary | ICD-10-CM | POA: Diagnosis not present

## 2016-05-13 DIAGNOSIS — F331 Major depressive disorder, recurrent, moderate: Secondary | ICD-10-CM | POA: Diagnosis not present

## 2016-05-21 DIAGNOSIS — H25012 Cortical age-related cataract, left eye: Secondary | ICD-10-CM | POA: Diagnosis not present

## 2016-05-21 DIAGNOSIS — H2513 Age-related nuclear cataract, bilateral: Secondary | ICD-10-CM | POA: Diagnosis not present

## 2016-05-21 DIAGNOSIS — H25013 Cortical age-related cataract, bilateral: Secondary | ICD-10-CM | POA: Diagnosis not present

## 2016-05-21 DIAGNOSIS — H2512 Age-related nuclear cataract, left eye: Secondary | ICD-10-CM | POA: Diagnosis not present

## 2016-05-27 DIAGNOSIS — F331 Major depressive disorder, recurrent, moderate: Secondary | ICD-10-CM | POA: Diagnosis not present

## 2016-06-24 DIAGNOSIS — F331 Major depressive disorder, recurrent, moderate: Secondary | ICD-10-CM | POA: Diagnosis not present

## 2016-07-10 DIAGNOSIS — N390 Urinary tract infection, site not specified: Secondary | ICD-10-CM | POA: Diagnosis not present

## 2016-07-10 DIAGNOSIS — I10 Essential (primary) hypertension: Secondary | ICD-10-CM | POA: Diagnosis not present

## 2016-07-10 DIAGNOSIS — R8299 Other abnormal findings in urine: Secondary | ICD-10-CM | POA: Diagnosis not present

## 2016-07-10 DIAGNOSIS — E784 Other hyperlipidemia: Secondary | ICD-10-CM | POA: Diagnosis not present

## 2016-07-15 DIAGNOSIS — F331 Major depressive disorder, recurrent, moderate: Secondary | ICD-10-CM | POA: Diagnosis not present

## 2016-07-22 DIAGNOSIS — F331 Major depressive disorder, recurrent, moderate: Secondary | ICD-10-CM | POA: Diagnosis not present

## 2016-08-12 DIAGNOSIS — F331 Major depressive disorder, recurrent, moderate: Secondary | ICD-10-CM | POA: Diagnosis not present

## 2016-08-20 DIAGNOSIS — F331 Major depressive disorder, recurrent, moderate: Secondary | ICD-10-CM | POA: Diagnosis not present

## 2016-08-26 DIAGNOSIS — F331 Major depressive disorder, recurrent, moderate: Secondary | ICD-10-CM | POA: Diagnosis not present

## 2016-10-07 DIAGNOSIS — F331 Major depressive disorder, recurrent, moderate: Secondary | ICD-10-CM | POA: Diagnosis not present

## 2016-10-14 DIAGNOSIS — F331 Major depressive disorder, recurrent, moderate: Secondary | ICD-10-CM | POA: Diagnosis not present

## 2016-10-14 DIAGNOSIS — Z23 Encounter for immunization: Secondary | ICD-10-CM | POA: Diagnosis not present

## 2016-10-16 DIAGNOSIS — E784 Other hyperlipidemia: Secondary | ICD-10-CM | POA: Diagnosis not present

## 2016-10-16 DIAGNOSIS — D6489 Other specified anemias: Secondary | ICD-10-CM | POA: Diagnosis not present

## 2016-10-16 DIAGNOSIS — F329 Major depressive disorder, single episode, unspecified: Secondary | ICD-10-CM | POA: Diagnosis not present

## 2016-10-16 DIAGNOSIS — R5383 Other fatigue: Secondary | ICD-10-CM | POA: Diagnosis not present

## 2016-10-16 DIAGNOSIS — Z0000 Encounter for general adult medical examination without abnormal findings: Secondary | ICD-10-CM | POA: Diagnosis not present

## 2016-10-16 DIAGNOSIS — Z1389 Encounter for screening for other disorder: Secondary | ICD-10-CM | POA: Diagnosis not present

## 2016-10-16 DIAGNOSIS — Z6833 Body mass index (BMI) 33.0-33.9, adult: Secondary | ICD-10-CM | POA: Diagnosis not present

## 2016-10-16 DIAGNOSIS — M545 Low back pain: Secondary | ICD-10-CM | POA: Diagnosis not present

## 2016-10-16 DIAGNOSIS — G4733 Obstructive sleep apnea (adult) (pediatric): Secondary | ICD-10-CM | POA: Diagnosis not present

## 2016-10-16 DIAGNOSIS — R634 Abnormal weight loss: Secondary | ICD-10-CM | POA: Diagnosis not present

## 2016-10-16 DIAGNOSIS — I10 Essential (primary) hypertension: Secondary | ICD-10-CM | POA: Diagnosis not present

## 2016-10-19 DIAGNOSIS — D649 Anemia, unspecified: Secondary | ICD-10-CM | POA: Diagnosis not present

## 2016-10-19 DIAGNOSIS — R634 Abnormal weight loss: Secondary | ICD-10-CM | POA: Diagnosis not present

## 2016-10-19 DIAGNOSIS — R5383 Other fatigue: Secondary | ICD-10-CM | POA: Diagnosis not present

## 2016-10-21 DIAGNOSIS — F331 Major depressive disorder, recurrent, moderate: Secondary | ICD-10-CM | POA: Diagnosis not present

## 2016-10-28 DIAGNOSIS — F331 Major depressive disorder, recurrent, moderate: Secondary | ICD-10-CM | POA: Diagnosis not present

## 2016-11-11 DIAGNOSIS — F331 Major depressive disorder, recurrent, moderate: Secondary | ICD-10-CM | POA: Diagnosis not present

## 2016-11-18 DIAGNOSIS — F331 Major depressive disorder, recurrent, moderate: Secondary | ICD-10-CM | POA: Diagnosis not present

## 2016-12-02 DIAGNOSIS — F331 Major depressive disorder, recurrent, moderate: Secondary | ICD-10-CM | POA: Diagnosis not present

## 2016-12-22 DIAGNOSIS — F330 Major depressive disorder, recurrent, mild: Secondary | ICD-10-CM | POA: Diagnosis not present

## 2016-12-23 DIAGNOSIS — F331 Major depressive disorder, recurrent, moderate: Secondary | ICD-10-CM | POA: Diagnosis not present

## 2016-12-30 DIAGNOSIS — F331 Major depressive disorder, recurrent, moderate: Secondary | ICD-10-CM | POA: Diagnosis not present

## 2017-01-13 DIAGNOSIS — F331 Major depressive disorder, recurrent, moderate: Secondary | ICD-10-CM | POA: Diagnosis not present

## 2017-01-20 DIAGNOSIS — F331 Major depressive disorder, recurrent, moderate: Secondary | ICD-10-CM | POA: Diagnosis not present

## 2017-01-27 DIAGNOSIS — F331 Major depressive disorder, recurrent, moderate: Secondary | ICD-10-CM | POA: Diagnosis not present

## 2017-02-03 DIAGNOSIS — F331 Major depressive disorder, recurrent, moderate: Secondary | ICD-10-CM | POA: Diagnosis not present

## 2017-02-17 DIAGNOSIS — F331 Major depressive disorder, recurrent, moderate: Secondary | ICD-10-CM | POA: Diagnosis not present

## 2017-02-22 DIAGNOSIS — F330 Major depressive disorder, recurrent, mild: Secondary | ICD-10-CM | POA: Diagnosis not present

## 2017-02-24 DIAGNOSIS — F331 Major depressive disorder, recurrent, moderate: Secondary | ICD-10-CM | POA: Diagnosis not present

## 2017-03-03 DIAGNOSIS — F331 Major depressive disorder, recurrent, moderate: Secondary | ICD-10-CM | POA: Diagnosis not present

## 2017-03-10 DIAGNOSIS — F331 Major depressive disorder, recurrent, moderate: Secondary | ICD-10-CM | POA: Diagnosis not present

## 2017-03-15 DIAGNOSIS — H00012 Hordeolum externum right lower eyelid: Secondary | ICD-10-CM | POA: Diagnosis not present

## 2017-03-17 DIAGNOSIS — F331 Major depressive disorder, recurrent, moderate: Secondary | ICD-10-CM | POA: Diagnosis not present

## 2017-03-24 DIAGNOSIS — F331 Major depressive disorder, recurrent, moderate: Secondary | ICD-10-CM | POA: Diagnosis not present

## 2017-03-31 DIAGNOSIS — F331 Major depressive disorder, recurrent, moderate: Secondary | ICD-10-CM | POA: Diagnosis not present

## 2017-04-07 DIAGNOSIS — F331 Major depressive disorder, recurrent, moderate: Secondary | ICD-10-CM | POA: Diagnosis not present

## 2017-04-14 DIAGNOSIS — F331 Major depressive disorder, recurrent, moderate: Secondary | ICD-10-CM | POA: Diagnosis not present

## 2017-04-19 ENCOUNTER — Ambulatory Visit (INDEPENDENT_AMBULATORY_CARE_PROVIDER_SITE_OTHER): Payer: Medicare Other | Admitting: Orthopaedic Surgery

## 2017-04-21 DIAGNOSIS — F331 Major depressive disorder, recurrent, moderate: Secondary | ICD-10-CM | POA: Diagnosis not present

## 2017-04-22 ENCOUNTER — Ambulatory Visit (INDEPENDENT_AMBULATORY_CARE_PROVIDER_SITE_OTHER): Payer: Medicare Other | Admitting: Orthopaedic Surgery

## 2017-04-29 ENCOUNTER — Ambulatory Visit (INDEPENDENT_AMBULATORY_CARE_PROVIDER_SITE_OTHER): Payer: Medicare Other

## 2017-04-29 ENCOUNTER — Ambulatory Visit (INDEPENDENT_AMBULATORY_CARE_PROVIDER_SITE_OTHER): Payer: Medicare Other | Admitting: Orthopaedic Surgery

## 2017-04-29 DIAGNOSIS — M65311 Trigger thumb, right thumb: Secondary | ICD-10-CM | POA: Diagnosis not present

## 2017-04-29 MED ORDER — BUPIVACAINE HCL 0.5 % IJ SOLN
0.3300 mL | INTRAMUSCULAR | Status: AC | PRN
  Administered 2017-04-29: .33 mL

## 2017-04-29 MED ORDER — LIDOCAINE HCL 1 % IJ SOLN
0.3000 mL | INTRAMUSCULAR | Status: AC | PRN
  Administered 2017-04-29: .3 mL

## 2017-04-29 MED ORDER — METHYLPREDNISOLONE ACETATE 40 MG/ML IJ SUSP
13.3300 mg | INTRAMUSCULAR | Status: AC | PRN
  Administered 2017-04-29: 13.33 mg

## 2017-04-29 NOTE — Progress Notes (Signed)
Office Visit Note   Patient: Molly Lucas           Date of Birth: 1946/07/05           MRN: [id number] Visit Date: 04/29/2017              Requested by: [name], MD [address] [place], [place] [address] PCP: [name], MD   Assessment & Plan: Visit Diagnoses:  1. Trigger thumb of right hand     Plan: Patient has right trigger thumb. Injection was performed into the A1 pulley. Patient tolerated this well. Follow-up as needed.  Follow-Up Instructions: Return if symptoms worsen or fail to improve.   Orders:  Orders Placed This Encounter  Procedures  . XR Hand Complete Right   No orders of the defined types were placed in this encounter.     Procedures: Hand/UE Inj Date/Time: 04/29/2017 12:11 PM Performed by: Leandrew Koyanagi Authorized by: Leandrew Koyanagi   Consent Given by:  Patient Timeout: prior to procedure the correct patient, procedure, and site was verified   Indications:  Pain Condition: trigger finger   Location:  Thumb (see office note for specific finger) Prep: patient was prepped and draped in usual sterile fashion   Needle Size:  25 G Approach:  Volar Medications:  0.3 mL lidocaine 1 %; 0.33 mL bupivacaine 0.5 %; 13.33 mg methylPREDNISolone acetate 40 MG/ML     Clinical Data: No additional findings.   Subjective: No chief complaint on file.   Patient is a 71 year old female comes in with a right trigger thumb for a couple weeks. Is actively triggering. She is right-hand dominant. The pain is worse with use of the thumb.    Review of Systems  Constitutional: Negative.   HENT: Negative.   Eyes: Negative.   Respiratory: Negative.   Cardiovascular: Negative.   Endocrine: Negative.   Musculoskeletal: Negative.   Neurological: Negative.   Hematological: Negative.   Psychiatric/Behavioral: Negative.   All other systems reviewed and are negative.    Objective: Vital Signs: There were no vitals taken for this  visit.  Physical Exam  Constitutional: She is oriented to person, place, and time. She appears well-developed and well-nourished.  HENT:  Head: Normocephalic and atraumatic.  Eyes: EOM are normal.  Neck: Neck supple.  Pulmonary/Chest: Effort normal.  Abdominal: Soft.  Neurological: She is alert and oriented to person, place, and time.  Skin: Skin is warm. Capillary refill takes less than 2 seconds.  Psychiatric: She has a normal mood and affect. Her behavior is normal. Judgment and thought content normal.  Nursing note and vitals reviewed.   Ortho Exam Right thumb exam is consistent with trigger thumb. Positive grind test. Specialty Comments:  No specialty comments available.  Imaging: No results found.   PMFS History: Patient Active Problem List   Diagnosis Date Noted  . Trigger thumb of right hand 04/29/2017  . Iron deficiency anemia 04/09/2016  . Hyperlipidemia 02/26/2016  . Family history of heart disease 02/26/2016  . Trivial Pericardial effusion 02/26/2016  . Anemia 11/29/2015  . Symptomatic anemia 11/29/2015  . Dyspnea   . Melena   . Occult blood in stools   . Recurrent right inguinal hernia 08/15/2013  . Right inguinal hernia 03/14/2013  . Degenerative arthritis of hip 06/24/2012   Past Medical History:  Diagnosis Date  . Anemia    Takes iron supplement  . Anxiety   . Arthritis   . Chronic pain of left thumb   .  Constipation   . 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 Depression   . Fibroid, uterine   . History of blood transfusion   . Hx of seasonal allergies   . Hyperlipemia   . Hypertension    hx of off medication now.  . OA (osteoarthritis) of hip    left  . Sleep apnea    cpap does not use, dentist made mouthipiece     Family History  Problem Relation Age of Onset  . Heart failure Mother   . CAD Maternal Grandfather   . Heart failure Maternal Grandfather   . Dementia Maternal Grandfather   .  CAD Paternal Grandfather   . CAD Paternal Grandmother   . Osteoporosis Mother   . Arthritis Mother   . Arthritis Sister     Past Surgical History:  Procedure Laterality Date  . CARPOMETACARPEL SUSPENSION PLASTY  08/2015   with abductor pollicis longus tendon transfer and suspensionplasty  . DE QUERVAIN'S RELEASE  08/2015  . DILATATION & CURRETTAGE/HYSTEROSCOPY WITH RESECTOCOPE  2007  . ESOPHAGOGASTRODUODENOSCOPY N/A 11/30/2015   Procedure: ESOPHAGOGASTRODUODENOSCOPY (EGD);  Surgeon: Richmond Campbell, MD;  Location: [facility];  Service: Endoscopy;  Laterality: N/A;  . HERNIA REPAIR Left 06/2012  . INGUINAL HERNIA REPAIR Right 05/03/2013   Procedure: HERNIA REPAIR INGUINAL ADULT;  Surgeon: Harl Bowie, MD;  Location: [facility];  Service: General;  Laterality: Right;  . INGUINAL HERNIA REPAIR Right 08/23/2013   Procedure: LAPAROSCOPIC INGUINAL HERNIA;  Surgeon: Harl Bowie, MD;  Location: [facility];  Service: General;  Laterality: Right;  . INSERTION OF MESH Right 05/03/2013   Procedure: INSERTION OF MESH;  Surgeon: Harl Bowie, MD;  Location: [facility];  Service: General;  Laterality: Right;  . INSERTION OF MESH Right 08/23/2013   Procedure: INSERTION OF MESH;  Surgeon: Harl Bowie, MD;  Location: [facility];  Service: General;  Laterality: Right;  . METACARPOPHALANGEAL JOINT ARTHRODESIS  08/2015   with local bone graft  . TONSILLECTOMY    . TOTAL HIP ARTHROPLASTY  06/24/2012   Procedure: TOTAL HIP ARTHROPLASTY ANTERIOR APPROACH;  Surgeon: Mcarthur Rossetti, MD;  Location: [facility];  Service: Orthopedics;  Laterality: Left;  Left total hip arthroplasty, anterior approach  . UTERINE FIBROID SURGERY  2007  . WISDOM TOOTH EXTRACTION     Social History   Occupational History  . Not on file.   Social History Main Topics  . Smoking status: Never Smoker  . Smokeless tobacco: Never Used  . Alcohol use 1.2 oz/week    2 Glasses of wine per week     Comment: occasional glass of  wine   . Drug use: No  . Sexual activity: Not on file

## 2017-05-03 ENCOUNTER — Ambulatory Visit (INDEPENDENT_AMBULATORY_CARE_PROVIDER_SITE_OTHER): Payer: Medicare Other | Admitting: Orthopaedic Surgery

## 2017-05-05 DIAGNOSIS — F331 Major depressive disorder, recurrent, moderate: Secondary | ICD-10-CM | POA: Diagnosis not present

## 2017-05-19 DIAGNOSIS — F331 Major depressive disorder, recurrent, moderate: Secondary | ICD-10-CM | POA: Diagnosis not present

## 2017-05-24 DIAGNOSIS — F330 Major depressive disorder, recurrent, mild: Secondary | ICD-10-CM | POA: Diagnosis not present

## 2017-05-26 DIAGNOSIS — F331 Major depressive disorder, recurrent, moderate: Secondary | ICD-10-CM | POA: Diagnosis not present

## 2017-06-02 DIAGNOSIS — F331 Major depressive disorder, recurrent, moderate: Secondary | ICD-10-CM | POA: Diagnosis not present

## 2017-06-11 DIAGNOSIS — H16103 Unspecified superficial keratitis, bilateral: Secondary | ICD-10-CM | POA: Diagnosis not present

## 2017-06-11 DIAGNOSIS — H5213 Myopia, bilateral: Secondary | ICD-10-CM | POA: Diagnosis not present

## 2017-06-11 DIAGNOSIS — H25813 Combined forms of age-related cataract, bilateral: Secondary | ICD-10-CM | POA: Diagnosis not present

## 2017-06-11 DIAGNOSIS — H04123 Dry eye syndrome of bilateral lacrimal glands: Secondary | ICD-10-CM | POA: Diagnosis not present

## 2017-06-16 DIAGNOSIS — F331 Major depressive disorder, recurrent, moderate: Secondary | ICD-10-CM | POA: Diagnosis not present

## 2017-06-23 DIAGNOSIS — F331 Major depressive disorder, recurrent, moderate: Secondary | ICD-10-CM | POA: Diagnosis not present

## 2017-06-30 DIAGNOSIS — F331 Major depressive disorder, recurrent, moderate: Secondary | ICD-10-CM | POA: Diagnosis not present

## 2017-07-06 DIAGNOSIS — H268 Other specified cataract: Secondary | ICD-10-CM | POA: Diagnosis not present

## 2017-07-06 DIAGNOSIS — H25811 Combined forms of age-related cataract, right eye: Secondary | ICD-10-CM | POA: Diagnosis not present

## 2017-07-21 DIAGNOSIS — F331 Major depressive disorder, recurrent, moderate: Secondary | ICD-10-CM | POA: Diagnosis not present

## 2017-08-10 DIAGNOSIS — H25812 Combined forms of age-related cataract, left eye: Secondary | ICD-10-CM | POA: Diagnosis not present

## 2017-08-10 DIAGNOSIS — H268 Other specified cataract: Secondary | ICD-10-CM | POA: Diagnosis not present

## 2017-08-12 DIAGNOSIS — F331 Major depressive disorder, recurrent, moderate: Secondary | ICD-10-CM | POA: Diagnosis not present

## 2017-08-18 DIAGNOSIS — F331 Major depressive disorder, recurrent, moderate: Secondary | ICD-10-CM | POA: Diagnosis not present

## 2017-08-25 DIAGNOSIS — F331 Major depressive disorder, recurrent, moderate: Secondary | ICD-10-CM | POA: Diagnosis not present

## 2017-09-01 DIAGNOSIS — F331 Major depressive disorder, recurrent, moderate: Secondary | ICD-10-CM | POA: Diagnosis not present

## 2017-09-13 DIAGNOSIS — Z23 Encounter for immunization: Secondary | ICD-10-CM | POA: Diagnosis not present

## 2017-09-15 DIAGNOSIS — F331 Major depressive disorder, recurrent, moderate: Secondary | ICD-10-CM | POA: Diagnosis not present

## 2017-09-22 DIAGNOSIS — F331 Major depressive disorder, recurrent, moderate: Secondary | ICD-10-CM | POA: Diagnosis not present

## 2017-10-12 DIAGNOSIS — E7849 Other hyperlipidemia: Secondary | ICD-10-CM | POA: Diagnosis not present

## 2017-10-12 DIAGNOSIS — R82998 Other abnormal findings in urine: Secondary | ICD-10-CM | POA: Diagnosis not present

## 2017-10-12 DIAGNOSIS — I10 Essential (primary) hypertension: Secondary | ICD-10-CM | POA: Diagnosis not present

## 2017-10-13 DIAGNOSIS — F331 Major depressive disorder, recurrent, moderate: Secondary | ICD-10-CM | POA: Diagnosis not present

## 2017-10-19 DIAGNOSIS — R5383 Other fatigue: Secondary | ICD-10-CM | POA: Diagnosis not present

## 2017-10-19 DIAGNOSIS — D649 Anemia, unspecified: Secondary | ICD-10-CM | POA: Diagnosis not present

## 2017-10-19 DIAGNOSIS — K219 Gastro-esophageal reflux disease without esophagitis: Secondary | ICD-10-CM | POA: Diagnosis not present

## 2017-10-19 DIAGNOSIS — Z1389 Encounter for screening for other disorder: Secondary | ICD-10-CM | POA: Diagnosis not present

## 2017-10-19 DIAGNOSIS — Z6839 Body mass index (BMI) 39.0-39.9, adult: Secondary | ICD-10-CM | POA: Diagnosis not present

## 2017-10-19 DIAGNOSIS — M545 Low back pain: Secondary | ICD-10-CM | POA: Diagnosis not present

## 2017-10-19 DIAGNOSIS — I10 Essential (primary) hypertension: Secondary | ICD-10-CM | POA: Diagnosis not present

## 2017-10-19 DIAGNOSIS — Z0000 Encounter for general adult medical examination without abnormal findings: Secondary | ICD-10-CM | POA: Diagnosis not present

## 2017-10-19 DIAGNOSIS — R0602 Shortness of breath: Secondary | ICD-10-CM | POA: Diagnosis not present

## 2017-10-19 DIAGNOSIS — G4733 Obstructive sleep apnea (adult) (pediatric): Secondary | ICD-10-CM | POA: Diagnosis not present

## 2017-10-19 DIAGNOSIS — E7849 Other hyperlipidemia: Secondary | ICD-10-CM | POA: Diagnosis not present

## 2017-10-19 DIAGNOSIS — R05 Cough: Secondary | ICD-10-CM | POA: Diagnosis not present

## 2017-10-21 DIAGNOSIS — D649 Anemia, unspecified: Secondary | ICD-10-CM | POA: Diagnosis not present

## 2017-10-22 DIAGNOSIS — Z1212 Encounter for screening for malignant neoplasm of rectum: Secondary | ICD-10-CM | POA: Diagnosis not present

## 2017-10-29 ENCOUNTER — Ambulatory Visit (HOSPITAL_COMMUNITY)
Admission: RE | Admit: 2017-10-29 | Discharge: 2017-10-29 | Disposition: A | Discharge status: Home / Self Care | Payer: Medicare Other | Source: Ambulatory Visit | Attending: Internal Medicine | Admitting: Internal Medicine

## 2017-10-29 DIAGNOSIS — D649 Anemia, unspecified: Secondary | ICD-10-CM | POA: Insufficient documentation

## 2017-10-29 MED ORDER — SODIUM CHLORIDE 0.9 % IV SOLN
510.0000 mg | INTRAVENOUS | Status: DC
  Administered 2017-10-29: 510 mg via INTRAVENOUS
  Filled 2017-10-29: qty 17

## 2017-11-04 ENCOUNTER — Ambulatory Visit (HOSPITAL_COMMUNITY)
Admission: RE | Admit: 2017-11-04 | Discharge: 2017-11-04 | Disposition: A | Discharge status: Home / Self Care | Payer: Medicare Other | Source: Ambulatory Visit | Attending: Internal Medicine | Admitting: Internal Medicine

## 2017-11-04 DIAGNOSIS — D649 Anemia, unspecified: Secondary | ICD-10-CM | POA: Diagnosis not present

## 2017-11-04 MED ORDER — SODIUM CHLORIDE 0.9 % IV SOLN
510.0000 mg | Freq: Once | INTRAVENOUS | Status: AC
  Administered 2017-11-04: 510 mg via INTRAVENOUS
  Filled 2017-11-04: qty 17

## 2017-11-04 MED ORDER — SODIUM CHLORIDE 0.9 % IV SOLN
510.0000 mg | INTRAVENOUS | Status: DC
  Filled 2017-11-04: qty 17

## 2017-11-10 DIAGNOSIS — F331 Major depressive disorder, recurrent, moderate: Secondary | ICD-10-CM | POA: Diagnosis not present

## 2017-12-21 DIAGNOSIS — R0602 Shortness of breath: Secondary | ICD-10-CM | POA: Diagnosis not present

## 2017-12-21 DIAGNOSIS — D649 Anemia, unspecified: Secondary | ICD-10-CM | POA: Diagnosis not present

## 2017-12-21 DIAGNOSIS — R634 Abnormal weight loss: Secondary | ICD-10-CM | POA: Diagnosis not present

## 2017-12-21 DIAGNOSIS — Z6835 Body mass index (BMI) 35.0-35.9, adult: Secondary | ICD-10-CM | POA: Diagnosis not present

## 2017-12-21 DIAGNOSIS — Z1389 Encounter for screening for other disorder: Secondary | ICD-10-CM | POA: Diagnosis not present

## 2017-12-21 DIAGNOSIS — R05 Cough: Secondary | ICD-10-CM | POA: Diagnosis not present

## 2017-12-21 DIAGNOSIS — R5383 Other fatigue: Secondary | ICD-10-CM | POA: Diagnosis not present

## 2017-12-21 DIAGNOSIS — G4733 Obstructive sleep apnea (adult) (pediatric): Secondary | ICD-10-CM | POA: Diagnosis not present

## 2018-01-12 ENCOUNTER — Telehealth: Payer: Self-pay | Admitting: Hematology

## 2018-01-12 NOTE — Telephone Encounter (Signed)
Appt has been scheduled for the pt to see Dr. Irene Limbo on 3/26 at 10am. Appt was scheduled with the pt's son who stated he wanted to be at his mother's appt which is the reason for the late appt. Aware to arrive 30 minutes early.

## 2018-01-26 DIAGNOSIS — R110 Nausea: Secondary | ICD-10-CM | POA: Diagnosis not present

## 2018-01-26 DIAGNOSIS — R630 Anorexia: Secondary | ICD-10-CM | POA: Diagnosis not present

## 2018-01-26 DIAGNOSIS — D500 Iron deficiency anemia secondary to blood loss (chronic): Secondary | ICD-10-CM | POA: Diagnosis not present

## 2018-01-26 DIAGNOSIS — Z8711 Personal history of peptic ulcer disease: Secondary | ICD-10-CM | POA: Diagnosis not present

## 2018-01-26 DIAGNOSIS — R197 Diarrhea, unspecified: Secondary | ICD-10-CM | POA: Diagnosis not present

## 2018-01-26 DIAGNOSIS — R634 Abnormal weight loss: Secondary | ICD-10-CM | POA: Diagnosis not present

## 2018-01-26 DIAGNOSIS — R1013 Epigastric pain: Secondary | ICD-10-CM | POA: Diagnosis not present

## 2018-01-27 DIAGNOSIS — K2960 Other gastritis without bleeding: Secondary | ICD-10-CM | POA: Diagnosis not present

## 2018-01-27 DIAGNOSIS — B3781 Candidal esophagitis: Secondary | ICD-10-CM | POA: Diagnosis not present

## 2018-01-27 DIAGNOSIS — R110 Nausea: Secondary | ICD-10-CM | POA: Diagnosis not present

## 2018-01-27 DIAGNOSIS — D500 Iron deficiency anemia secondary to blood loss (chronic): Secondary | ICD-10-CM | POA: Diagnosis not present

## 2018-01-27 DIAGNOSIS — K2980 Duodenitis without bleeding: Secondary | ICD-10-CM | POA: Diagnosis not present

## 2018-01-27 DIAGNOSIS — D509 Iron deficiency anemia, unspecified: Secondary | ICD-10-CM | POA: Diagnosis not present

## 2018-01-27 DIAGNOSIS — K449 Diaphragmatic hernia without obstruction or gangrene: Secondary | ICD-10-CM | POA: Diagnosis not present

## 2018-01-27 DIAGNOSIS — R630 Anorexia: Secondary | ICD-10-CM | POA: Diagnosis not present

## 2018-01-27 NOTE — Progress Notes (Signed)
HEMATOLOGY/ONCOLOGY CONSULTATION NOTE  Date of Service: 02/01/2018  Patient Care Team: [name], MD as PCP - General (Internal Medicine)  CHIEF COMPLAINTS/PURPOSE OF CONSULTATION:   Elevated Hemoglobin and Hematocrit  Elevated calcium levels   HISTORY OF PRESENTING ILLNESS:   Molly Lucas is a wonderful 72 y.o. female who has been referred to [place] by her PCP Dr. Bevelyn Buckles for evaluation and management of weight loss with elevated Hemoglobin and Hematocrit and hypercalcemia. She presents to the clinic today accompanied by her son.   She notes she has lost weight recently but this was due to her trouble eating. She has seen GI Dr. Thana Farr and had a endoscopy last week which showed a hiatal hernia which made it hard to swallow. A biopsy was done as well. She was given Prilosec and her eating has recently improved. She has had 3 meals in the past few days. For the past few months she had no appetite. She has lost 15 pounds over the last 2 months.  She notes she is prone to being anemic. She has had anemia episodes 3 times in the past few years. First time in 2014 she was treated with IV Feraheme. Another time in 11/2015 she had a ulcer with GI bleeding and her Hg went down to 4 and she was given blood transfusion. In 10/2017 she her Hg went down to again and was again treated with IV Feraheme. Her last colonoscopy was 2 years ago and was clear from polyps or concerns. She notes having black stool only once since her ulcer due to Pepto bismol. She notes she has not been on oral calcium for 2-3 months. She denies back pain, bone pain or kidney stones.   Today she notes family stress. She has had a mammogram in the last 2 years. She now lives in a retirement community.  She notes she had fluid around her heart once and went to see cardiologist who cleared her.  On review of symptoms, pt notes tightness in chest resolved with Prilosec. She denies back pain or bone pain. She has  decreased appetite still. She notes since not eating much she has felt feverish and moderately sweaty. This has not occurred in the past few days. She notes her bowel movements are random because she is not currently eating much. She is ale to pass urine fine. She denies cough, chest pain or difficulty breathing. She notes stress fracture b/w L3 and L4 which has arthritis around it.   MEDICAL HISTORY:  Past Medical History:  Diagnosis Date  . Anemia    Takes iron supplement  . Anxiety   . Arthritis   . Chronic pain of left thumb   . Constipation   . 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 Depression   . Fibroid, uterine   . History of blood transfusion   . Hx of seasonal allergies   . Hyperlipemia   . Hypertension    hx of off medication now.  . OA (osteoarthritis) of hip    left  . Sleep apnea    cpap does not use, dentist made mouthipiece     SURGICAL HISTORY: Past Surgical History:  Procedure Laterality Date  . CARPOMETACARPEL SUSPENSION PLASTY  08/2015   with abductor pollicis longus tendon transfer and suspensionplasty  . DE QUERVAIN'S RELEASE  08/2015  . DILATATION & CURRETTAGE/HYSTEROSCOPY WITH RESECTOCOPE  2007  . ESOPHAGOGASTRODUODENOSCOPY N/A 11/30/2015  Procedure: ESOPHAGOGASTRODUODENOSCOPY (EGD);  Surgeon: Richmond Campbell, MD;  Location: [facility];  Service: Endoscopy;  Laterality: N/A;  . HERNIA REPAIR Left 06/2012  . INGUINAL HERNIA REPAIR Right 05/03/2013   Procedure: HERNIA REPAIR INGUINAL ADULT;  Surgeon: Harl Bowie, MD;  Location: [place];  Service: General;  Laterality: Right;  . INGUINAL HERNIA REPAIR Right 08/23/2013   Procedure: LAPAROSCOPIC INGUINAL HERNIA;  Surgeon: Harl Bowie, MD;  Location: [place];  Service: General;  Laterality: Right;  . INSERTION OF MESH Right 05/03/2013   Procedure: INSERTION OF MESH;  Surgeon: Harl Bowie, MD;  Location: [place];  Service: General;  Laterality: Right;    . INSERTION OF MESH Right 08/23/2013   Procedure: INSERTION OF MESH;  Surgeon: Harl Bowie, MD;  Location: [place];  Service: General;  Laterality: Right;  . METACARPOPHALANGEAL JOINT ARTHRODESIS  08/2015   with local bone graft  . TONSILLECTOMY    . TOTAL HIP ARTHROPLASTY  06/24/2012   Procedure: TOTAL HIP ARTHROPLASTY ANTERIOR APPROACH;  Surgeon: Mcarthur Rossetti, MD;  Location: [facility];  Service: Orthopedics;  Laterality: Left;  Left total hip arthroplasty, anterior approach  . UTERINE FIBROID SURGERY  2007  . WISDOM TOOTH EXTRACTION      SOCIAL HISTORY: Social History   Socioeconomic History  . Marital status: Married    Spouse name: Not on file  . Number of children: Not on file  . Years of education: Not on file  . Highest education level: Not on file  Occupational History  . Not on file  Social Needs  . Financial resource strain: Not on file  . Food insecurity:    Worry: Not on file    Inability: Not on file  . Transportation needs:    Medical: Not on file    Non-medical: Not on file  Tobacco Use  . Smoking status: Never Smoker  . Smokeless tobacco: Never Used  Substance and Sexual Activity  . Alcohol use: Yes    Alcohol/week: 1.2 oz    Types: 2 Glasses of wine per week    Comment: occasional glass of wine   . Drug use: No  . Sexual activity: Not on file  Lifestyle  . Physical activity:    Days per week: Not on file    Minutes per session: Not on file  . Stress: Not on file  Relationships  . Social connections:    Talks on phone: Not on file    Gets together: Not on file    Attends religious service: Not on file    Active member of club or organization: Not on file    Attends meetings of clubs or organizations: Not on file    Relationship status: Not on file  . Intimate partner violence:    Fear of current or ex partner: Not on file    Emotionally abused: Not on file    Physically abused: Not on file    Forced sexual activity: Not on file   Other Topics Concern  . Not on file  Social History Narrative  . Not on file    FAMILY HISTORY: Family History  Problem Relation Age of Onset  . Heart failure Mother   . Osteoporosis Mother   . Arthritis Mother   . CAD Maternal Grandfather   . Heart failure Maternal Grandfather   . Dementia Maternal Grandfather   . CAD Paternal Grandmother   . CAD Paternal Grandfather   . Arthritis Sister  ALLERGIES:  is allergic to penicillins.  MEDICATIONS:  Current Outpatient Medications  Medication Sig Dispense Refill  . CALCIUM PO Take 2 tablets by mouth daily.     . cholecalciferol (VITAMIN D) 1000 UNITS tablet Take 2,000 Units by mouth daily.    . clorazepate (TRANXENE) 7.5 MG tablet Take 15 mg by mouth at bedtime.     . DULoxetine (CYMBALTA) 60 MG capsule Take 60 mg by mouth at bedtime.    . gabapentin (NEURONTIN) 300 MG capsule Take 300 mg by mouth 2 (two) times daily.    Marland Kitchen glucosamine-chondroitin 500-400 MG tablet Take 1 tablet by mouth 3 (three) times daily.    Marland Kitchen L-Methylfolate-Algae (DEPLIN 15 PO) Take 15 mg by mouth at bedtime.    . Multiple Vitamins-Minerals (HAIR/SKIN/NAILS) TABS Take 1 tablet by mouth daily.    . multivitamin-lutein (OCUVITE-LUTEIN) CAPS Take 1 capsule by mouth daily.    Marland Kitchen omega-3 acid ethyl esters (LOVAZA) 1 G capsule Take 2 g by mouth daily.    Marland Kitchen omeprazole (PRILOSEC) 40 MG capsule Take 40 mg by mouth daily.    . pravastatin (PRAVACHOL) 20 MG tablet Take 20 mg by mouth daily.    . Probiotic Product (PROBIOTIC DAILY PO) Take 1 capsule by mouth 2 (two) times daily.     . fluticasone (FLONASE) 50 MCG/ACT nasal spray Place 2 sprays into both nostrils daily.     No current facility-administered medications for this visit.     REVIEW OF SYSTEMS: .10 Point review of Systems was done is negative except as noted above.  PHYSICAL EXAMINATION: ECOG PERFORMANCE STATUS: 1 - Symptomatic but completely ambulatory  . Vitals:   02/01/18 1251  BP: (!) 146/98   Pulse: (!) 105  Resp: 18  Temp: 98.5 F (36.9 C)  SpO2: 98%   Filed Weights   02/01/18 1251  Weight: 180 lb (81.6 kg)   .Body mass index is 32.92 kg/m. GENERAL:alert, in no acute distress and comfortable SKIN: no acute rashes, no significant lesions EYES: conjunctiva are pink and non-injected, sclera anicteric OROPHARYNX: MMM, no exudates, no oropharyngeal erythema or ulceration NECK: supple, no JVD LYMPH:  no palpable lymphadenopathy in the cervical, axillary or inguinal regions LUNGS: clear to auscultation b/l with normal respiratory effort HEART: regular rate & rhythm ABDOMEN:  normoactive bowel sounds , non tender, not distended. Extremity: no pedal edema PSYCH: alert & oriented x 3 with fluent speech NEURO: no focal motor/sensory deficits  LABORATORY DATA:  I have reviewed the data as listed  . CBC Latest Ref Rng & Units 02/01/2018 11/30/2015 11/30/2015  WBC 3.9 - 10.3 K/uL 7.0 5.6 3.9(L)  Hemoglobin 11.6 - 15.9 g/dL 15.1 8.7(L) 7.9(L)  Hematocrit 34.8 - 46.6 % 45.4 28.8(L) 25.7(L)  Platelets 145 - 400 K/uL 192 215 207    . CMP Latest Ref Rng & Units 02/01/2018 02/01/2018 04/02/2016  Glucose 70 - 140 mg/dL 104 - 89  BUN 7 - 26 mg/dL 12 - 13  Creatinine 0.60 - 1.10 mg/dL 1.07 - 0.80  Sodium 136 - 145 mmol/L 141 - 143  Potassium 3.5 - 5.1 mmol/L 3.7 - 4.5  Chloride 98 - 109 mmol/L 105 - 106  CO2 22 - 29 mmol/L 28 - 29  Calcium 8.7 - 10.3 mg/dL 11.0(H) 10.9(H) 10.2  Total Protein 6.4 - 8.3 g/dL 7.1 - 6.4  Total Bilirubin 0.2 - 1.2 mg/dL 1.3(H) - 0.8  Alkaline Phos 40 - 150 U/L 106 - 88  AST 5 - 34 U/L 20 -  23  ALT 0 - 55 U/L 24 - 23   . Lab Results  Component Value Date   IRON 88 02/01/2018   TIBC 323 02/01/2018   IRONPCTSAT 27 02/01/2018   (Iron and TIBC)  Lab Results  Component Value Date   FERRITIN 35 02/01/2018   Component     Latest Ref Rng & Units 02/01/2018  IgG (Immunoglobin G), Serum     700 - 1,600 mg/dL 864  IgA     64 - 422 mg/dL 194   IgM (Immunoglobulin M), Srm     26 - 217 mg/dL 71  Total Protein ELP     6.0 - 8.5 g/dL 6.8  Albumin SerPl Elph-Mcnc     2.9 - 4.4 g/dL 3.8  Alpha 1     0.0 - 0.4 g/dL 0.3  Alpha2 Glob SerPl Elph-Mcnc     0.4 - 1.0 g/dL 0.7  B-Globulin SerPl Elph-Mcnc     0.7 - 1.3 g/dL 1.1  Gamma Glob SerPl Elph-Mcnc     0.4 - 1.8 g/dL 0.9  M Protein SerPl Elph-Mcnc     Not Observed g/dL Not Observed  Globulin, Total     2.2 - 3.9 g/dL 3.0  Albumin/Glob SerPl     0.7 - 1.7 1.3  IFE 1      Comment  Please Note (HCV):      Comment  PTH, Intact     15 - 65 pg/mL 56  Calcium, Total (PTH)     8.7 - 10.3 mg/dL 10.9 (H)  PTH Interp      Comment  Kappa free light chain     3.3 - 19.4 mg/L 23.4 (H)  Lamda free light chains     5.7 - 26.3 mg/L 10.3  Kappa, lamda light chain ratio     0.26 - 1.65 2.27 (H)  Beta-2 Microglobulin     0.6 - 2.4 mg/L 2.9 (H)  Sed Rate     0 - 22 mm/hr 2  LDH     125 - 245 U/L 169    OUTSIDE LABS            With Ferritin of 5.7         RADIOGRAPHIC STUDIES: I have personally reviewed the radiological images as listed and agreed with the findings in the report. No results found.  ASSESSMENT & PLAN:  EMANI TAUSSIG is a 72 y.o. Caucasian female with   1. Elevated hematocrit and Hemoglobin- Hg at 16.4 and HCT 53.8 -No palpable liver or spleen upon initial exam  likely secondary to dehydration potentially caused by her hypercalcemia. Plan -rpt labs today show resolution of her polycythemia with a hb/HCT of 15.1/45.4 -patient counseled to maintain good po hydration.  2. Hiatal Hernia with h/o cameron ulcers with previous recurrent Iron deficiency needing IV iron replacement in the past. -Found on GI workup and likely cause of GI bleeding leading to anemia.  3. Recurrent Anemia - secondary to GI blood loss -in 2014 treated with IV Feraheme, 2017 after GI ulcer and was treated with Blood transfusion, 11/2016 treated with IV Feraheme.    . Lab Results  Component Value Date   IRON 88 02/01/2018   TIBC 323 02/01/2018   IRONPCTSAT 27 02/01/2018   (Iron and TIBC)  Lab Results  Component Value Date   FERRITIN 35 02/01/2018  PLAN -I advised her to avoid aspirin and NSAIDs.  -Mx of HH/Cameron ulcers per  GI (Dr. Earlean Shawl). -would recommend po  Iron polysaccharide 150mg  po daily for iron maintenance to target ferritin level of close to 100  -IV Iron prn if recurrent iron deficiency and GI bleeding.   4. Weight loss - 15 pounds over 2 months -secondary to GI issues  -Improved eating with Prilosec 40mg  once a day  5. Elevated Calcium at 11.7 rpt today improved to 11 PTH - WNL Myeloma panel - no M spike and neg IFE K/L ratio minimally abnormal but only minimal increase in Kappa light chains B2MG  -borderline elevated. PLAN:  -Whole body skeletal survey and Bone scan -given that there is no overt evidence of Myeloma on labs and nl PTH we will order and get CT chest/abd/pelvis to r/o other etiologies of hypercalcemia - sarcoid/lymphoma (esp with increased B2MG ), lung cancer etc. -I reviewed her labs with the pt and her son. Her elevated calcium shows concern for dehydration and need for more workup with blood tests and possible bone scan.    Labs today  CT chest/abd/pelvis in 1 week Whole skeletal survey and body bone scan in 1 week  RTC with Dr Irene Limbo in 2 weeks    All of the patients questions were answered with apparent satisfaction. The patient knows to call the clinic with any problems, questions or concerns.  I spent 45 minutes counseling the patient face to face. The total time spent in the appointment was 60 minutes and more than 50% was on counseling and direct patient cares.    Sullivan Lone MD [place] AAHIVMS [facility] Hematology/Oncology Physician [facility]  (Office):       [phone] (Work cell):  [phone] (Fax):           [phone]  02/01/2018 2:02 PM  This document serves as a  record of services personally performed by Sullivan Lone, MD. It was created on his behalf by Joslyn Devon, a trained medical scribe. The creation of this record is based on the scribe's personal observations and the provider's statements to them.    .I have reviewed the above documentation for accuracy and completeness, and I agree with the above. Brunetta Genera MD MS

## 2018-01-28 DIAGNOSIS — K208 Other esophagitis: Secondary | ICD-10-CM | POA: Diagnosis not present

## 2018-01-28 DIAGNOSIS — K2980 Duodenitis without bleeding: Secondary | ICD-10-CM | POA: Diagnosis not present

## 2018-01-28 DIAGNOSIS — K2990 Gastroduodenitis, unspecified, without bleeding: Secondary | ICD-10-CM | POA: Diagnosis not present

## 2018-01-28 DIAGNOSIS — K2960 Other gastritis without bleeding: Secondary | ICD-10-CM | POA: Diagnosis not present

## 2018-01-28 DIAGNOSIS — K319 Disease of stomach and duodenum, unspecified: Secondary | ICD-10-CM | POA: Diagnosis not present

## 2018-01-28 DIAGNOSIS — B3781 Candidal esophagitis: Secondary | ICD-10-CM | POA: Diagnosis not present

## 2018-02-01 ENCOUNTER — Inpatient Hospital Stay: Payer: Medicare Other | Attending: Hematology | Admitting: Hematology

## 2018-02-01 ENCOUNTER — Encounter: Payer: Self-pay | Admitting: Hematology

## 2018-02-01 ENCOUNTER — Encounter: Payer: Medicare Other | Admitting: Hematology

## 2018-02-01 ENCOUNTER — Inpatient Hospital Stay: Payer: Medicare Other

## 2018-02-01 VITALS — BP 146/98 | HR 105 | Temp 98.5°F | Resp 18 | Ht 62.0 in | Wt 180.0 lb

## 2018-02-01 DIAGNOSIS — R718 Other abnormality of red blood cells: Secondary | ICD-10-CM | POA: Diagnosis not present

## 2018-02-01 DIAGNOSIS — R0789 Other chest pain: Secondary | ICD-10-CM | POA: Insufficient documentation

## 2018-02-01 DIAGNOSIS — G4700 Insomnia, unspecified: Secondary | ICD-10-CM | POA: Diagnosis not present

## 2018-02-01 DIAGNOSIS — I10 Essential (primary) hypertension: Secondary | ICD-10-CM | POA: Insufficient documentation

## 2018-02-01 DIAGNOSIS — K449 Diaphragmatic hernia without obstruction or gangrene: Secondary | ICD-10-CM | POA: Insufficient documentation

## 2018-02-01 DIAGNOSIS — R630 Anorexia: Secondary | ICD-10-CM

## 2018-02-01 DIAGNOSIS — G4730 Sleep apnea, unspecified: Secondary | ICD-10-CM | POA: Diagnosis not present

## 2018-02-01 DIAGNOSIS — R634 Abnormal weight loss: Secondary | ICD-10-CM | POA: Insufficient documentation

## 2018-02-01 DIAGNOSIS — Z79899 Other long term (current) drug therapy: Secondary | ICD-10-CM | POA: Diagnosis not present

## 2018-02-01 DIAGNOSIS — E785 Hyperlipidemia, unspecified: Secondary | ICD-10-CM

## 2018-02-01 DIAGNOSIS — R509 Fever, unspecified: Secondary | ICD-10-CM

## 2018-02-01 DIAGNOSIS — D6489 Other specified anemias: Secondary | ICD-10-CM | POA: Diagnosis not present

## 2018-02-01 DIAGNOSIS — Z6834 Body mass index (BMI) 34.0-34.9, adult: Secondary | ICD-10-CM | POA: Diagnosis not present

## 2018-02-01 DIAGNOSIS — Z8719 Personal history of other diseases of the digestive system: Secondary | ICD-10-CM | POA: Diagnosis not present

## 2018-02-01 DIAGNOSIS — M1990 Unspecified osteoarthritis, unspecified site: Secondary | ICD-10-CM | POA: Diagnosis not present

## 2018-02-01 DIAGNOSIS — D500 Iron deficiency anemia secondary to blood loss (chronic): Secondary | ICD-10-CM

## 2018-02-01 DIAGNOSIS — G4733 Obstructive sleep apnea (adult) (pediatric): Secondary | ICD-10-CM | POA: Diagnosis not present

## 2018-02-01 DIAGNOSIS — R61 Generalized hyperhidrosis: Secondary | ICD-10-CM | POA: Diagnosis not present

## 2018-02-01 DIAGNOSIS — D751 Secondary polycythemia: Secondary | ICD-10-CM

## 2018-02-01 DIAGNOSIS — R05 Cough: Secondary | ICD-10-CM

## 2018-02-01 DIAGNOSIS — R059 Cough, unspecified: Secondary | ICD-10-CM

## 2018-02-01 LAB — CBC WITH DIFFERENTIAL/PLATELET
BASOS ABS: 0 10*3/uL (ref 0.0–0.1)
Basophils Relative: 0 %
EOS ABS: 0.2 10*3/uL (ref 0.0–0.5)
EOS PCT: 3 %
HCT: 45.4 % (ref 34.8–46.6)
Hemoglobin: 15.1 g/dL (ref 11.6–15.9)
LYMPHS ABS: 1.7 10*3/uL (ref 0.9–3.3)
Lymphocytes Relative: 25 %
MCH: 30.7 pg (ref 25.1–34.0)
MCHC: 33.3 g/dL (ref 31.5–36.0)
MCV: 92.3 fL (ref 79.5–101.0)
Monocytes Absolute: 0.8 10*3/uL (ref 0.1–0.9)
Monocytes Relative: 11 %
Neutro Abs: 4.2 10*3/uL (ref 1.5–6.5)
Neutrophils Relative %: 61 %
PLATELETS: 192 10*3/uL (ref 145–400)
RBC: 4.92 MIL/uL (ref 3.70–5.45)
RDW: 19.7 % — AB (ref 11.2–14.5)
WBC: 7 10*3/uL (ref 3.9–10.3)

## 2018-02-01 LAB — LACTATE DEHYDROGENASE: LDH: 169 U/L (ref 125–245)

## 2018-02-01 LAB — RETICULOCYTES
RBC.: 4.92 MIL/uL (ref 3.70–5.45)
RETIC COUNT ABSOLUTE: 59 10*3/uL (ref 33.7–90.7)
RETIC CT PCT: 1.2 % (ref 0.7–2.1)

## 2018-02-01 LAB — CMP (CANCER CENTER ONLY)
ALK PHOS: 106 U/L (ref 40–150)
ALT: 24 U/L (ref 0–55)
AST: 20 U/L (ref 5–34)
Albumin: 3.9 g/dL (ref 3.5–5.0)
Anion gap: 8 (ref 3–11)
BUN: 12 mg/dL (ref 7–26)
CALCIUM: 11 mg/dL — AB (ref 8.4–10.4)
CO2: 28 mmol/L (ref 22–29)
CREATININE: 1.07 mg/dL (ref 0.60–1.10)
Chloride: 105 mmol/L (ref 98–109)
GFR, EST AFRICAN AMERICAN: 58 mL/min — AB (ref >60)
GFR, EST NON AFRICAN AMERICAN: 50 mL/min — AB (ref >60)
Glucose, Bld: 104 mg/dL (ref 70–140)
Potassium: 3.7 mmol/L (ref 3.5–5.1)
Sodium: 141 mmol/L (ref 136–145)
Total Bilirubin: 1.3 mg/dL — ABNORMAL HIGH (ref 0.2–1.2)
Total Protein: 7.1 g/dL (ref 6.4–8.3)

## 2018-02-01 LAB — IRON AND TIBC
Iron: 88 ug/dL (ref 41–142)
Saturation Ratios: 27 % (ref 21–57)
TIBC: 323 ug/dL (ref 236–444)
UIBC: 235 ug/dL

## 2018-02-01 LAB — FERRITIN: Ferritin: 35 ng/mL (ref 9–269)

## 2018-02-01 LAB — SEDIMENTATION RATE: Sed Rate: 2 mm/hr (ref 0–22)

## 2018-02-02 DIAGNOSIS — F330 Major depressive disorder, recurrent, mild: Secondary | ICD-10-CM | POA: Diagnosis not present

## 2018-02-02 DIAGNOSIS — F331 Major depressive disorder, recurrent, moderate: Secondary | ICD-10-CM | POA: Diagnosis not present

## 2018-02-02 LAB — KAPPA/LAMBDA LIGHT CHAINS
KAPPA, LAMDA LIGHT CHAIN RATIO: 2.27 — AB (ref 0.26–1.65)
Kappa free light chain: 23.4 mg/L — ABNORMAL HIGH (ref 3.3–19.4)
LAMDA FREE LIGHT CHAINS: 10.3 mg/L (ref 5.7–26.3)

## 2018-02-02 LAB — PTH, INTACT AND CALCIUM
Calcium, Total (PTH): 10.9 mg/dL — ABNORMAL HIGH (ref 8.7–10.3)
PTH: 56 pg/mL (ref 15–65)

## 2018-02-02 LAB — BETA 2 MICROGLOBULIN, SERUM: Beta-2 Microglobulin: 2.9 mg/L — ABNORMAL HIGH (ref 0.6–2.4)

## 2018-02-03 ENCOUNTER — Other Ambulatory Visit: Payer: Self-pay | Admitting: Hematology

## 2018-02-03 LAB — MULTIPLE MYELOMA PANEL, SERUM
ALBUMIN/GLOB SERPL: 1.3 (ref 0.7–1.7)
ALPHA 1: 0.3 g/dL (ref 0.0–0.4)
Albumin SerPl Elph-Mcnc: 3.8 g/dL (ref 2.9–4.4)
Alpha2 Glob SerPl Elph-Mcnc: 0.7 g/dL (ref 0.4–1.0)
B-Globulin SerPl Elph-Mcnc: 1.1 g/dL (ref 0.7–1.3)
GLOBULIN, TOTAL: 3 g/dL (ref 2.2–3.9)
Gamma Glob SerPl Elph-Mcnc: 0.9 g/dL (ref 0.4–1.8)
IGA: 194 mg/dL (ref 64–422)
IgG (Immunoglobin G), Serum: 864 mg/dL (ref 700–1600)
IgM (Immunoglobulin M), Srm: 71 mg/dL (ref 26–217)
Total Protein ELP: 6.8 g/dL (ref 6.0–8.5)

## 2018-02-04 ENCOUNTER — Telehealth: Payer: Self-pay

## 2018-02-04 DIAGNOSIS — G4700 Insomnia, unspecified: Secondary | ICD-10-CM | POA: Diagnosis not present

## 2018-02-04 NOTE — Telephone Encounter (Signed)
Pt scheduled for bone survey on 02/08/18 at 1100. Arrival time 1045. Left VM on pt phone to notify of appt. Dr. Irene Limbo would like to complete bone survey at this time because next available bone scan is 4/11. Will re-evaluate need for additional bone scan at appt with physician on 4/9. Provided directions to radiology and Central Scheduling phone number in the event that pt needs to reschedule. [phone]. Encouraged pt to get appt as soon as she can if reschedule required. Any questions directed back to [place] can be called in to [phone].

## 2018-02-08 ENCOUNTER — Telehealth: Payer: Self-pay

## 2018-02-08 ENCOUNTER — Telehealth: Payer: Self-pay | Admitting: Hematology

## 2018-02-08 ENCOUNTER — Ambulatory Visit (HOSPITAL_COMMUNITY): Payer: Medicare Other

## 2018-02-08 NOTE — Telephone Encounter (Signed)
Pt called for clarity regarding messages received yesterday by this RN. Pt unable to have xray completed this morning d/t stomach virus. Pt has been unable to leave the house today. Xray rescheduled for 4/5. Explained that d/t lack of results from lab work, Dr. Irene Limbo also wanted CT C/A/P performed. Pt asked what to expect from procedure. Gave general knowledge regarding oral contrast and time to remain NPO, but deferred further questions to [employer] and Radiology. Pt plan to call Central Scheduling back to attempt to add CT C/A/P same day as xray. Confirmed number [phone] [id number]. Pt verbalized understanding of all information and that based on results from xray and CT, bone survey may be able to be cancelled.

## 2018-02-08 NOTE — Telephone Encounter (Signed)
Returned call to patient she was calling in ref ot her Bone Scan appt for today/ per 4/2 phone message

## 2018-02-11 ENCOUNTER — Ambulatory Visit (HOSPITAL_COMMUNITY): Admission: RE | Admit: 2018-02-11 | Payer: Medicare Other | Source: Ambulatory Visit

## 2018-02-11 ENCOUNTER — Ambulatory Visit (HOSPITAL_COMMUNITY): Payer: Medicare Other

## 2018-02-14 ENCOUNTER — Ambulatory Visit (HOSPITAL_COMMUNITY): Payer: Medicare Other

## 2018-02-15 ENCOUNTER — Ambulatory Visit: Payer: Medicare Other | Admitting: Hematology

## 2018-02-15 ENCOUNTER — Ambulatory Visit (HOSPITAL_COMMUNITY): Payer: Medicare Other

## 2018-02-15 ENCOUNTER — Telehealth: Payer: Self-pay | Admitting: Hematology

## 2018-02-15 ENCOUNTER — Telehealth: Payer: Self-pay

## 2018-02-15 ENCOUNTER — Ambulatory Visit: Payer: Medicare Other

## 2018-02-15 NOTE — Telephone Encounter (Signed)
Notified by Central Scheduling that pt did not show up for CT scan today. Called pt who explained that she felt extremely weak and was unable to come for scan. Pt has number [phone] and plans to call in the morning to reschedule CT.

## 2018-02-15 NOTE — Telephone Encounter (Signed)
Tried to call regarding voicemail  °

## 2018-02-15 NOTE — Telephone Encounter (Signed)
Pt was ill last week and unable to have xray completed. Dr. Irene Limbo okay to proceed with CT A/P today and Bone Scan on Thursday. Pt and radiology notified that xray cancelled per MD. Pt aware of other two appts and times.

## 2018-02-16 DIAGNOSIS — F331 Major depressive disorder, recurrent, moderate: Secondary | ICD-10-CM | POA: Diagnosis not present

## 2018-02-17 ENCOUNTER — Encounter (HOSPITAL_COMMUNITY)
Admission: RE | Admit: 2018-02-17 | Discharge: 2018-02-17 | Disposition: A | Discharge status: Home / Self Care | Payer: Medicare Other | Source: Ambulatory Visit | Attending: Hematology | Admitting: Hematology

## 2018-02-17 ENCOUNTER — Ambulatory Visit (HOSPITAL_COMMUNITY): Payer: Medicare Other

## 2018-02-17 ENCOUNTER — Ambulatory Visit (HOSPITAL_COMMUNITY)
Admission: RE | Admit: 2018-02-17 | Discharge: 2018-02-17 | Disposition: A | Discharge status: Home / Self Care | Payer: Medicare Other | Source: Ambulatory Visit | Attending: Hematology | Admitting: Hematology

## 2018-02-17 DIAGNOSIS — D649 Anemia, unspecified: Secondary | ICD-10-CM | POA: Diagnosis not present

## 2018-02-17 DIAGNOSIS — K449 Diaphragmatic hernia without obstruction or gangrene: Secondary | ICD-10-CM | POA: Insufficient documentation

## 2018-02-17 DIAGNOSIS — J9811 Atelectasis: Secondary | ICD-10-CM | POA: Diagnosis not present

## 2018-02-17 DIAGNOSIS — R634 Abnormal weight loss: Secondary | ICD-10-CM

## 2018-02-17 DIAGNOSIS — R059 Cough, unspecified: Secondary | ICD-10-CM

## 2018-02-17 DIAGNOSIS — R05 Cough: Secondary | ICD-10-CM

## 2018-02-17 MED ORDER — TECHNETIUM TC 99M MEDRONATE IV KIT
25.0000 | PACK | Freq: Once | INTRAVENOUS | Status: AC | PRN
  Administered 2018-02-17: 18.9 via INTRAVENOUS

## 2018-02-17 MED ORDER — IOHEXOL 300 MG/ML  SOLN
100.0000 mL | Freq: Once | INTRAMUSCULAR | Status: AC | PRN
  Administered 2018-02-17: 100 mL via INTRAVENOUS

## 2018-02-18 ENCOUNTER — Ambulatory Visit: Payer: Medicare Other | Admitting: Hematology

## 2018-03-04 LAB — JAK2 (INCLUDING V617F AND EXON 12), MPL,& CALR W/RFL MPN PANEL (NGS)

## 2018-03-16 DIAGNOSIS — F331 Major depressive disorder, recurrent, moderate: Secondary | ICD-10-CM | POA: Diagnosis not present

## 2018-04-12 ENCOUNTER — Encounter: Payer: Self-pay | Admitting: Neurology

## 2018-04-14 ENCOUNTER — Encounter

## 2018-04-14 ENCOUNTER — Telehealth: Payer: Self-pay | Admitting: Neurology

## 2018-04-14 ENCOUNTER — Institutional Professional Consult (permissible substitution): Payer: Medicare Other | Admitting: Neurology

## 2018-04-14 NOTE — Telephone Encounter (Signed)
Pt was no show to apt. 

## 2018-04-15 ENCOUNTER — Encounter: Payer: Self-pay | Admitting: Neurology

## 2018-04-27 DIAGNOSIS — F331 Major depressive disorder, recurrent, moderate: Secondary | ICD-10-CM | POA: Diagnosis not present

## 2018-05-11 DIAGNOSIS — F331 Major depressive disorder, recurrent, moderate: Secondary | ICD-10-CM | POA: Diagnosis not present

## 2018-06-20 ENCOUNTER — Encounter

## 2018-06-20 ENCOUNTER — Encounter: Payer: Self-pay | Admitting: Neurology

## 2018-06-20 ENCOUNTER — Ambulatory Visit (INDEPENDENT_AMBULATORY_CARE_PROVIDER_SITE_OTHER): Payer: Medicare Other | Admitting: Neurology

## 2018-06-20 ENCOUNTER — Telehealth: Payer: Self-pay | Admitting: Neurology

## 2018-06-20 VITALS — BP 118/87 | HR 88 | Ht 62.0 in | Wt 168.0 lb

## 2018-06-20 DIAGNOSIS — R5383 Other fatigue: Secondary | ICD-10-CM

## 2018-06-20 DIAGNOSIS — F329 Major depressive disorder, single episode, unspecified: Secondary | ICD-10-CM

## 2018-06-20 DIAGNOSIS — R0602 Shortness of breath: Secondary | ICD-10-CM | POA: Diagnosis not present

## 2018-06-20 DIAGNOSIS — G4733 Obstructive sleep apnea (adult) (pediatric): Secondary | ICD-10-CM | POA: Diagnosis not present

## 2018-06-20 DIAGNOSIS — F32A Depression, unspecified: Secondary | ICD-10-CM | POA: Insufficient documentation

## 2018-06-20 DIAGNOSIS — G4719 Other hypersomnia: Secondary | ICD-10-CM | POA: Diagnosis not present

## 2018-06-20 NOTE — Telephone Encounter (Signed)
Called Dr Shon Baton office and LVM with a nursing personnel to see if they could fax a copy of the overnight oximetry testing that was completed through their office. LVM with my fax number and number to office if they needed to call and discuss

## 2018-06-20 NOTE — Progress Notes (Signed)
SLEEP MEDICINE CLINIC   Provider:  Larey Seat, M D  Primary Care Physician:  [name], MD   Referring Provider: [name], MD   Chief Complaint  Patient presents with  . New Patient (Initial Visit)    pt alone, rm 10. Molly Lucas states that in 2010 she had a sleep study which showed sleep apnea, she tried CPAP and was unable to tolerate it. Later she used a dental device, which worked for many years, but she is now very fatigued and sleepy- there may be more apnea?  She is looking to testing and try CPAP again if indicated.     HPI:  Molly Lucas . 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 Philip Lucas for a new sleep apnea evaluation, almost 10 years after her last visit with me. She has moved to [place] and misses a lot of friends, many have passed on. She is reporting that she still getting used to that [place] environment.   She had a sleep study at [facility] in 5/ 2010, AHI was 13. 2/h, and she was placed on 7 cm water, she experienced a god reduction in AHI , but she never could tolerate the nasal pillows - she declared that she could tolerate the thermistor but not the pillows. She did not sleep the first study in the lab. She had been separated from her spouse of 30 years 10 years ago, there was anxiety and depression contributed to insomnia. The dental device was made for her around 8 years ago, by [facility]  [employer]. Dr. Sharlett Iles suspects that the resurgence of fatigue and to some degree sleepiness is related to apnea but still being present, he ordered a pulse oximetry through his office.   He also mentioned a medical history of chronic depression or dysthymia, anxiety, irritable bowel syndrome, essential hypertension well-controlled, sleep study in December 2009 showed mild obstructive sleep apnea, she has a history of degenerative disc disease which is certainly H related, some spinal stenosis at  C5-6 and some lower back pain related.  Cardiac work-up in 2017 showed normal ejection fraction and no evidence of ischemia.  Chief complaint according to patient : I am sleepy and fatigued.   Sleep habits are as follows:  Dinner with the " inmates" , then retreats to her apartement and watches TV, and go to bed around 11-12 midnight, she reads in bed - in a book with pages. Her average sleep latency is 3--45 minutes, bur once asleep she stays asleep. Rare bathroom breaks, dreams some night, sleeps on her back or sides. She uses 1-2 pillows. The bedroom is cool, quiet and dark. She wants to wake up at 7.30 , but she sleeps through- over sleeps and its 8.30 or 9 AM. She feels she loses the day. During the day she feels very exhausted. She has participated in meetings, but feels sleepy. She more often naps.   Sleep medical history and family sleep history: father had heart disease- prostate cancer  , mother thyroid disease.   Social history:  Divorced, 3 adult children, 8 grandchildren. Daughter is a Location manager in [place], [place];  Has 2 sets of twins, middle son lives in [place] with 3 children, and youngest son has a 75.52 years old. Non smoker, social drinker, caffeine user; ocassional iced tea or green tea , no coffee, no soda ;   Review of Systems: Out of a complete  14 system review, the patient complains of only the following symptoms, and all other reviewed systems are negative. Fatigue and EDS   Epworth score 6/ 24 points , Fatigue severity score 39/ 63 points , depression score /na    Social History   Socioeconomic History  . Marital status: Married    Spouse name: Not on file  . Number of children: Not on file  . Years of education: Not on file  . Highest education level: Not on file  Occupational History  . Not on file  Social Needs  . Financial resource strain: Not on file  . Food insecurity:    Worry: Not on file    Inability: Not on file  . Transportation needs:    Medical: Not on  file    Non-medical: Not on file  Tobacco Use  . Smoking status: Never Smoker  . Smokeless tobacco: Never Used  Substance and Sexual Activity  . Alcohol use: Yes    Alcohol/week: 2.0 standard drinks    Types: 2 Glasses of wine per week    Comment: occasional glass of wine   . Drug use: No  . Sexual activity: Not on file  Lifestyle  . Physical activity:    Days per week: Not on file    Minutes per session: Not on file  . Stress: Not on file  Relationships  . Social connections:    Talks on phone: Not on file    Gets together: Not on file    Attends religious service: Not on file    Active member of club or organization: Not on file    Attends meetings of clubs or organizations: Not on file    Relationship status: Not on file  . Intimate partner violence:    Fear of current or ex partner: Not on file    Emotionally abused: Not on file    Physically abused: Not on file    Forced sexual activity: Not on file  Other Topics Concern  . Not on file  Social History Narrative  . Not on file    Family History  Problem Relation Age of Onset  . Heart failure Mother   . Osteoporosis Mother   . Arthritis Mother   . CAD Maternal Grandfather   . Heart failure Maternal Grandfather   . Dementia Maternal Grandfather   . CAD Paternal Grandmother   . CAD Paternal Grandfather   . Arthritis Sister     Past Medical History:  Diagnosis Date  . Anemia    Takes iron supplement  . Anxiety   . Arthritis   . Chronic pain of left thumb   . Constipation   . 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 Depression   . Fibroid, uterine   . History of blood transfusion   . Hx of seasonal allergies   . Hyperlipemia   . Hypertension    hx of off medication now.  . OA (osteoarthritis) of hip    left  . Sleep apnea    cpap does not use, dentist made mouthipiece     Past Surgical History:  Procedure Laterality Date  . CARPOMETACARPEL SUSPENSION  PLASTY  08/2015   with abductor pollicis longus tendon transfer and suspensionplasty  . DE QUERVAIN'S RELEASE  08/2015  . DILATATION & CURRETTAGE/HYSTEROSCOPY WITH RESECTOCOPE  2007  . ESOPHAGOGASTRODUODENOSCOPY N/A 11/30/2015   Procedure: ESOPHAGOGASTRODUODENOSCOPY (EGD);  Surgeon: Richmond Campbell, MD;  Location:  [facility];  Service: Endoscopy;  Laterality: N/A;  . HERNIA REPAIR Left 06/2012  . INGUINAL HERNIA REPAIR Right 05/03/2013   Procedure: HERNIA REPAIR INGUINAL ADULT;  Surgeon: Harl Bowie, MD;  Location: [facility];  Service: General;  Laterality: Right;  . INGUINAL HERNIA REPAIR Right 08/23/2013   Procedure: LAPAROSCOPIC INGUINAL HERNIA;  Surgeon: Harl Bowie, MD;  Location: [facility];  Service: General;  Laterality: Right;  . INSERTION OF MESH Right 05/03/2013   Procedure: INSERTION OF MESH;  Surgeon: Harl Bowie, MD;  Location: [facility];  Service: General;  Laterality: Right;  . INSERTION OF MESH Right 08/23/2013   Procedure: INSERTION OF MESH;  Surgeon: Harl Bowie, MD;  Location: [facility];  Service: General;  Laterality: Right;  . METACARPOPHALANGEAL JOINT ARTHRODESIS  08/2015   with local bone graft  . TONSILLECTOMY    . TOTAL HIP ARTHROPLASTY  06/24/2012   Procedure: TOTAL HIP ARTHROPLASTY ANTERIOR APPROACH;  Surgeon: Mcarthur Rossetti, MD;  Location: [facility];  Service: Orthopedics;  Laterality: Left;  Left total hip arthroplasty, anterior approach  . UTERINE FIBROID SURGERY  2007  . WISDOM TOOTH EXTRACTION      Current Outpatient Medications  Medication Sig Dispense Refill  . CALCIUM PO Take 2 tablets by mouth daily.     . cholecalciferol (VITAMIN D) 1000 UNITS tablet Take 2,000 Units by mouth daily.    . clorazepate (TRANXENE) 7.5 MG tablet Take 15 mg by mouth at bedtime.     . DULoxetine (CYMBALTA) 60 MG capsule Take 60 mg by mouth at bedtime.    . gabapentin (NEURONTIN) 300 MG capsule Take 300 mg by mouth 2 (two) times daily.    Marland Kitchen  glucosamine-chondroitin 500-400 MG tablet Take 1 tablet by mouth 3 (three) times daily.    Marland Kitchen L-Methylfolate-Algae (DEPLIN 15 PO) Take 15 mg by mouth at bedtime.    . Multiple Vitamins-Minerals (HAIR/SKIN/NAILS) TABS Take 1 tablet by mouth daily.    . multivitamin-lutein (OCUVITE-LUTEIN) CAPS Take 1 capsule by mouth daily.    Marland Kitchen omega-3 acid ethyl esters (LOVAZA) 1 G capsule Take 2 g by mouth daily.    . pravastatin (PRAVACHOL) 20 MG tablet Take 20 mg by mouth daily.    . Probiotic Product (PROBIOTIC DAILY PO) Take 1 capsule by mouth 2 (two) times daily.      No current facility-administered medications for this visit.     Allergies as of 06/20/2018 - Review Complete 06/20/2018  Allergen Reaction Noted  . Penicillins Other (See Comments) 06/13/2012    Vitals: BP 118/87   Pulse 88   Ht 5\' 2"  (1.575 m)   Wt 168 lb (76.2 kg)   BMI 30.73 kg/m  Last Weight:  Wt Readings from Last 1 Encounters:  06/20/18 168 lb (76.2 kg)   RDE:YCXK mass index is 30.73 kg/m.     Last Height:   Ht Readings from Last 1 Encounters:  06/20/18 5\' 2"  (1.575 m)    Physical exam:  General: The patient is awake, alert and appears not in acute distress. The patient is well groomed.  Head: Normocephalic, atraumatic. Neck is supple. Mallampati 3-4 , biological teeth.   neck circumference: 13". Nasal airflow, Retrognathia is seen.  Cardiovascular:  Regular rate and rhythm, without  murmurs or carotid bruit, and without distended neck veins. Respiratory: Lungs are clear to auscultation. She does interrupt her speech to breath-  Skin:  Without evidence of edema, or rash Trunk: BMI is 30.7 . The  patient's posture is stooped,   Neurologic exam : The patient is awake and alert, oriented to place and time.   Memory subjective described as intact.   Attention span & concentration ability appears normal.  Speech is fluent,  without  dysarthria, but dysphonia .  Mood and affect are appropriate.  Cranial  nerves: Pupils are equal and briskly reactive to light. Funduscopic exam status post cataract surgery.   Extraocular movements  in vertical and horizontal planes intact and without nystagmus. Visual fields by finger perimetry are intact. Hearing to finger rub intact. Facial sensation intact to fine touch.  Facial motor strength is symmetric and tongue and uvula move midline. Shoulder shrug was symmetrical.   Motor exam:  Normal tone, muscle bulk and symmetric strength in all extremities. Sensory:  Fine touch, pinprick and vibration were normal. Coordination: Rapid alternating movements in the fingers/hands was normal. Finger-to-nose maneuver  normal without evidence of ataxia, dysmetria or tremor.  Gait and station: Patient walks without assistive device and is able unassisted to climb up to the exam table. Strength within normal limits. Stance is stable and normal.  Deep tendon reflexes: in the  upper and lower extremities are symmetric and intact. Babinski maneuver response is downgoing.  Assessment:  After physical and neurologic examination, review of laboratory studies,  Personal review of imaging studies, reports of other /same  Imaging studies, results of polysomnography and / or neurophysiology testing and pre-existing records as far as provided in visit., my assessment is   1)  We will need to repeat a sleep study- attended, mainly because there is a possibility of central apnea, hypercapnia and these contribute to fatigue.   2)  She is not sure if she still snores- nobody has been with her for a long time.  3)  Weight loss - she fluctuates.   The patient was advised of the nature of the diagnosed disorder , the treatment options and the  risks for general health and wellness arising from not treating the condition.   I spent more than 45 minutes of face to face time with the patient.  Greater than 50% of time was spent in counseling and coordination of care. We have discussed the  diagnosis and differential and I answered the patient's questions.    Plan:  Treatment plan and additional workup :  attended SPLIT night polysomnography with Co2.  Please sue the study for desensitization, she has never used a nasal dream wear mask.  Rv after test.   Larey Seat, MD 05/15/8674, 4:49 AM  Certified in Neurology by ABPN Certified in [place] by [facility] [address] [place], [place] [address]

## 2018-06-20 NOTE — Patient Instructions (Signed)

## 2018-06-29 ENCOUNTER — Ambulatory Visit (INDEPENDENT_AMBULATORY_CARE_PROVIDER_SITE_OTHER): Payer: Medicare Other | Admitting: Neurology

## 2018-06-29 DIAGNOSIS — G4733 Obstructive sleep apnea (adult) (pediatric): Secondary | ICD-10-CM

## 2018-06-29 DIAGNOSIS — F329 Major depressive disorder, single episode, unspecified: Secondary | ICD-10-CM

## 2018-06-29 DIAGNOSIS — F32A Depression, unspecified: Secondary | ICD-10-CM

## 2018-06-29 DIAGNOSIS — R5383 Other fatigue: Secondary | ICD-10-CM

## 2018-06-29 DIAGNOSIS — R0602 Shortness of breath: Secondary | ICD-10-CM

## 2018-06-29 DIAGNOSIS — G4719 Other hypersomnia: Secondary | ICD-10-CM

## 2018-07-04 NOTE — Procedures (Signed)
PATIENT'S NAME:  Molly Lucas, Molly Lucas DOB:      1946/04/15      MR#:    [id number]     DATE OF RECORDING: 06/29/2018 REFERRING M.D.:  [name], M.D. 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 Philip Aspen for a new sleep apnea evaluation, almost 10 years after her last visit with me. She has moved to [place] a year ago but misses a lot of friends, reporting that she still getting used to that [employer] environment. She 9s not sure if she still snores, lives and sleeps alone.   She had a sleep study at [facility] in 03/2009, AHI was 13.2/h, and she was placed on CPAP at 7 cm water where she experienced a good reduction in AHI -but never could tolerate the nasal pillows - She did not sleep the first study in the lab. She had been separated from her spouse of 30 years around that time 10 years ago, there was anxiety and depression contributed to insomnia. A dental device was then made for her around 8 years ago, by [employer]. She has lost a lot of weight since the last sleep study.   Dr. Sharlett Iles ordered a pulse oximetry through his office. He also mentioned a medical history of chronic depression or dysthymia, anxiety, irritable bowel syndrome, essential hypertension well-controlled, sleep study in December 2009 showed mild obstructive sleep apnea, she has a history of degenerative disc disease, spinal stenosis C5-6 and some lower back pain. Cardiac work-up in 2017 showed normal ejection fraction and no evidence of ischemia.  The patient endorsed the Epworth Sleepiness Scale at 6/24 points, and FSS at 39/63 points, depression score was not applied. The patient's weight 168 pounds with a height of 62 (inches), resulting in a BMI of 30.8 kg/m2. The patient's neck circumference measured 13 inches.  CURRENT MEDICATIONS: Tranxene, Cymbalta, Neurontin, Deplin, Pravachol    PROCEDURE:  This is a multichannel digital polysomnogram utilizing the Somnostar 11.2 system.  Electrodes and sensors were applied and monitored per AASM Specifications.   EEG, EOG, Chin and Limb EMG, were sampled at 200 Hz.  ECG, Snore and Nasal Pressure, Thermal Airflow, Respiratory Effort, CPAP Flow and Pressure, Oximetry was sampled at 50 Hz. Digital video and audio were recorded.      BASELINE STUDY: Lights Out was at 22:57 and Lights On at 04:59.  Total recording time (TRT) was 363 minutes, with a total sleep time (TST) of 191 minutes.  The patient's sleep latency was 34 minutes.  REM latency was 0 minutes. The sleep efficiency was 52.6 %.     SLEEP ARCHITECTURE: WASO (Wake after sleep onset) was 143.5 minutes.  There were 76 minutes in Stage N1, 93.5 minutes Stage N2, 21.5 minutes Stage N3 and 0 minutes in Stage REM.  The percentage of Stage N1 was 39.8%, Stage N2 was 49.%, Stage N3 was 11.3% and Stage R (REM sleep) was 0%.   RESPIRATORY ANALYSIS:  There were a total of 26 respiratory events:  1 obstructive apnea, 0 central apneas and 1 mixed apnea with a total of 2 apneas and an apnea index (AI) of 0.6 /hour. There were 24 hypopneas with a hypopnea index of 7.5 /hour. The patient also had 0 respiratory event related arousals (RERAs). The total APNEA/HYPOPNEA INDEX (AHI) was 8.2 /hour and the total RESPIRATORY DISTURBANCE INDEX was 8.2 /hour.  0 events occurred in REM sleep and 49 events in NREM. The REM AHI was 0.0/hour, versus a non-REM AHI of 8.2/h. The patient spent 153 minutes of total sleep time in the supine position and 38 minutes in non-supine. The supine AHI was 10.2/h versus a non-supine AHI of 0.0.  OXYGEN SATURATION & C02:  The Wake baseline 02 saturation was 90%, with the lowest being 85%. Time spent below 89% saturation equaled 3 minutes. Average End Tidal CO2 during sleep was 37.4 torr. Peak CO2 was 42 torr during NREM sleep, Total sleep time greater than 40 torr was 4.50  minutes.   AROUSALS:  The patient had a total of 0 Periodic Limb Movements. The arousals were noted as: 129 were spontaneous, 0 were associated with PLMs, and 18 were associated with respiratory events. Audio and video analysis did not show any abnormal or unusual movements, behaviors, phonations or vocalizations. Sleep was highly fragmented.   The patient took one bathroom break. Snoring was noted. EKG was in keeping with normal sinus rhythm (NSR).  IMPRESSION: 1. Mild Obstructive Sleep Apnea (OSA) with AHI of 8.2 and in supine 10.2/h.  2. Neither Hypoxemia nor Hypercapnia were documented. 3. Insomnia with severe sleep fragmentation due to spontaneous arousals and sleep void of REM stage sleep.   Post-study, the patient indicated that sleep was more difficult than usual, the room was too cool.   RECOMMENDATIONS: CPAP therapy is optional, the overall apnea is very mild, not complicated by gas exchange disorders and the main concern is the high degree of sleep fragmentation. A dental device could suffice in this type of apnea with some snoring.  The patient slept on her right side for the end of sleep study and had less fragmentation in that period.  I suspect pain or discomfort to cause arousals in supine- the patient may need to be evaluated by a pain specialist is this is confirmed I would consider trazodone as a sleep aid.    I certify that I have reviewed the entire raw data recording prior to the issuance of this report in accordance with the Standards of Accreditation of the American Academy of Sleep Medicine (AASM)    Larey Seat, MD    07-04-2018  Diplomat, American Board of Psychiatry and Neurology  Diplomat, American Board of [place] Director, [employer] [facility] [employer]

## 2018-07-05 ENCOUNTER — Encounter: Payer: Self-pay | Admitting: Neurology

## 2018-07-05 ENCOUNTER — Telehealth: Payer: Self-pay | Admitting: Neurology

## 2018-07-05 ENCOUNTER — Other Ambulatory Visit: Payer: Self-pay | Admitting: Neurology

## 2018-07-05 MED ORDER — TRAZODONE HCL 50 MG PO TABS: 25.0000 mg | ORAL_TABLET | Freq: Every day | ORAL | 5 refills | Status: DC

## 2018-07-05 NOTE — Telephone Encounter (Signed)
Called patient to discuss sleep study results. No answer at this time. LVM for the patient to call back.   

## 2018-07-05 NOTE — Telephone Encounter (Signed)
Patient returning a call regarding sleep results.

## 2018-07-05 NOTE — Telephone Encounter (Signed)
-----   Message from Larey Seat, MD sent at 07/04/2018  4:55 PM EDT ----- IMPRESSION: 1. Mild Obstructive Sleep Apnea (OSA) with AHI of 8.2 and in  supine 10.2/h.  2. Neither Hypoxemia nor Hypercapnia were documented. 3. Insomnia with severe sleep fragmentation due to spontaneous  arousals and sleep void of REM stage sleep.   Post-study, the patient indicated that sleep was more difficult  than usual, the room was too cool.   RECOMMENDATIONS: CPAP therapy is optional, the overall apnea is  very mild, not complicated by gas exchange disorders and the main  concern is the high degree of sleep fragmentation. A dental device could suffice in this type of apnea with some  snoring.  The patient slept on her right side for the end of sleep study  and had less fragmentation in that period.  I suspect pain or discomfort to cause arousals in supine- the  patient may need to be evaluated by a pain specialist is this is  confirmed I would consider trazodone as a sleep aid.

## 2018-07-05 NOTE — Telephone Encounter (Signed)
Called the patient back and advised her of the sleep study results. Informed her that the sleep study showed mild apnea but was ok with oxygenation and PLM's. Informed her that insomnia was noted in the study. Pt states that she knew she didn't sleep as good as she normally does. She states that she was cold and the tech turned the heat up and gave extra blanket but she was unable to get comfortable. Pt saw the my chart and noted Dr Dohmeier's recommendation to a medication trazadone to help with the insomnia. Patient is agreeable to trying this and would like to know how often Dr Brett Fairy would recommend the patient take this medication. Informed her that I would let Dr Brett Fairy know that she is interested in trying the medication to see if this would help. Informed the patient of her recommendations of treating the mild apnea. Pt states that she has a dental device that she normally uses already. I have advised the patient since she has had this for several years to have the dental device assessed by the dentist that provides care for it and see if it is still working effectively. Pt verbalized understanding. Pt had no questions at this time but was encouraged to call back if questions arise. Pt has asked the medication be called into [facility] with the instructions on how she should take it. She was appreciative for the call back and the information.

## 2018-07-06 DIAGNOSIS — F331 Major depressive disorder, recurrent, moderate: Secondary | ICD-10-CM | POA: Diagnosis not present

## 2018-07-13 DIAGNOSIS — F331 Major depressive disorder, recurrent, moderate: Secondary | ICD-10-CM | POA: Diagnosis not present

## 2018-07-20 DIAGNOSIS — F331 Major depressive disorder, recurrent, moderate: Secondary | ICD-10-CM | POA: Diagnosis not present

## 2018-08-10 DIAGNOSIS — F331 Major depressive disorder, recurrent, moderate: Secondary | ICD-10-CM | POA: Diagnosis not present

## 2018-08-15 DIAGNOSIS — K449 Diaphragmatic hernia without obstruction or gangrene: Secondary | ICD-10-CM | POA: Diagnosis not present

## 2018-08-15 DIAGNOSIS — D509 Iron deficiency anemia, unspecified: Secondary | ICD-10-CM | POA: Diagnosis not present

## 2018-08-15 DIAGNOSIS — Z86010 Personal history of colonic polyps: Secondary | ICD-10-CM | POA: Diagnosis not present

## 2018-08-15 DIAGNOSIS — K210 Gastro-esophageal reflux disease with esophagitis: Secondary | ICD-10-CM | POA: Diagnosis not present

## 2018-08-23 DIAGNOSIS — Z23 Encounter for immunization: Secondary | ICD-10-CM | POA: Diagnosis not present

## 2018-08-24 DIAGNOSIS — F331 Major depressive disorder, recurrent, moderate: Secondary | ICD-10-CM | POA: Diagnosis not present

## 2018-08-31 DIAGNOSIS — F331 Major depressive disorder, recurrent, moderate: Secondary | ICD-10-CM | POA: Diagnosis not present

## 2018-09-07 DIAGNOSIS — F331 Major depressive disorder, recurrent, moderate: Secondary | ICD-10-CM | POA: Diagnosis not present

## 2018-09-14 DIAGNOSIS — F331 Major depressive disorder, recurrent, moderate: Secondary | ICD-10-CM | POA: Diagnosis not present

## 2018-09-15 DIAGNOSIS — K648 Other hemorrhoids: Secondary | ICD-10-CM | POA: Diagnosis not present

## 2018-09-15 DIAGNOSIS — Z86010 Personal history of colonic polyps: Secondary | ICD-10-CM | POA: Diagnosis not present

## 2018-09-15 DIAGNOSIS — Z8719 Personal history of other diseases of the digestive system: Secondary | ICD-10-CM | POA: Diagnosis not present

## 2018-09-15 DIAGNOSIS — D509 Iron deficiency anemia, unspecified: Secondary | ICD-10-CM | POA: Diagnosis not present

## 2018-09-19 DIAGNOSIS — D649 Anemia, unspecified: Secondary | ICD-10-CM | POA: Diagnosis not present

## 2018-09-19 DIAGNOSIS — D509 Iron deficiency anemia, unspecified: Secondary | ICD-10-CM | POA: Diagnosis not present

## 2018-09-19 DIAGNOSIS — G4733 Obstructive sleep apnea (adult) (pediatric): Secondary | ICD-10-CM | POA: Diagnosis not present

## 2018-09-19 DIAGNOSIS — R0602 Shortness of breath: Secondary | ICD-10-CM | POA: Diagnosis not present

## 2018-09-19 DIAGNOSIS — E7849 Other hyperlipidemia: Secondary | ICD-10-CM | POA: Diagnosis not present

## 2018-09-19 DIAGNOSIS — I10 Essential (primary) hypertension: Secondary | ICD-10-CM | POA: Diagnosis not present

## 2018-10-05 DIAGNOSIS — F331 Major depressive disorder, recurrent, moderate: Secondary | ICD-10-CM | POA: Diagnosis not present

## 2018-10-12 DIAGNOSIS — F331 Major depressive disorder, recurrent, moderate: Secondary | ICD-10-CM | POA: Diagnosis not present

## 2018-10-17 DIAGNOSIS — R82998 Other abnormal findings in urine: Secondary | ICD-10-CM | POA: Diagnosis not present

## 2018-10-17 DIAGNOSIS — I10 Essential (primary) hypertension: Secondary | ICD-10-CM | POA: Diagnosis not present

## 2018-10-17 DIAGNOSIS — E7849 Other hyperlipidemia: Secondary | ICD-10-CM | POA: Diagnosis not present

## 2018-10-19 ENCOUNTER — Other Ambulatory Visit (HOSPITAL_COMMUNITY): Payer: Self-pay

## 2018-10-19 DIAGNOSIS — F331 Major depressive disorder, recurrent, moderate: Secondary | ICD-10-CM | POA: Diagnosis not present

## 2018-10-19 DIAGNOSIS — Z124 Encounter for screening for malignant neoplasm of cervix: Secondary | ICD-10-CM | POA: Diagnosis not present

## 2018-10-19 DIAGNOSIS — Z1231 Encounter for screening mammogram for malignant neoplasm of breast: Secondary | ICD-10-CM | POA: Diagnosis not present

## 2018-10-19 DIAGNOSIS — Z01419 Encounter for gynecological examination (general) (routine) without abnormal findings: Secondary | ICD-10-CM | POA: Diagnosis not present

## 2018-10-20 ENCOUNTER — Inpatient Hospital Stay (HOSPITAL_COMMUNITY): Admission: RE | Admit: 2018-10-20 | Payer: Medicare Other | Source: Ambulatory Visit

## 2018-10-21 ENCOUNTER — Ambulatory Visit (HOSPITAL_COMMUNITY)
Admission: RE | Admit: 2018-10-21 | Discharge: 2018-10-21 | Disposition: A | Discharge status: Home / Self Care | Payer: Medicare Other | Source: Ambulatory Visit | Attending: Internal Medicine | Admitting: Internal Medicine

## 2018-10-21 DIAGNOSIS — D649 Anemia, unspecified: Secondary | ICD-10-CM | POA: Insufficient documentation

## 2018-10-21 MED ORDER — SODIUM CHLORIDE 0.9 % IV SOLN
510.0000 mg | INTRAVENOUS | Status: DC
  Administered 2018-10-21: 510 mg via INTRAVENOUS
  Filled 2018-10-21: qty 17

## 2018-10-24 DIAGNOSIS — Z0000 Encounter for general adult medical examination without abnormal findings: Secondary | ICD-10-CM | POA: Diagnosis not present

## 2018-10-24 DIAGNOSIS — M25511 Pain in right shoulder: Secondary | ICD-10-CM | POA: Diagnosis not present

## 2018-10-24 DIAGNOSIS — D6489 Other specified anemias: Secondary | ICD-10-CM | POA: Diagnosis not present

## 2018-10-24 DIAGNOSIS — F411 Generalized anxiety disorder: Secondary | ICD-10-CM | POA: Diagnosis not present

## 2018-10-27 ENCOUNTER — Ambulatory Visit (HOSPITAL_COMMUNITY)
Admission: RE | Admit: 2018-10-27 | Discharge: 2018-10-27 | Disposition: A | Discharge status: Home / Self Care | Payer: Medicare Other | Source: Ambulatory Visit | Attending: Internal Medicine | Admitting: Internal Medicine

## 2018-10-27 DIAGNOSIS — D649 Anemia, unspecified: Secondary | ICD-10-CM | POA: Insufficient documentation

## 2018-10-27 MED ORDER — SODIUM CHLORIDE 0.9 % IV SOLN
510.0000 mg | INTRAVENOUS | Status: DC
  Administered 2018-10-27: 510 mg via INTRAVENOUS
  Filled 2018-10-27: qty 510

## 2018-11-17 DIAGNOSIS — H5213 Myopia, bilateral: Secondary | ICD-10-CM | POA: Diagnosis not present

## 2018-11-17 DIAGNOSIS — H04123 Dry eye syndrome of bilateral lacrimal glands: Secondary | ICD-10-CM | POA: Diagnosis not present

## 2018-11-17 DIAGNOSIS — H16103 Unspecified superficial keratitis, bilateral: Secondary | ICD-10-CM | POA: Diagnosis not present

## 2018-11-30 DIAGNOSIS — G8929 Other chronic pain: Secondary | ICD-10-CM | POA: Diagnosis not present

## 2018-11-30 DIAGNOSIS — M542 Cervicalgia: Secondary | ICD-10-CM | POA: Diagnosis not present

## 2018-11-30 DIAGNOSIS — M549 Dorsalgia, unspecified: Secondary | ICD-10-CM | POA: Diagnosis not present

## 2018-11-30 DIAGNOSIS — N62 Hypertrophy of breast: Secondary | ICD-10-CM | POA: Diagnosis not present

## 2018-12-03 IMAGING — NM NM BONE WHOLE BODY
2 series · 2 of 2 positions shown · non-contrast
Comparison: CT 02/17/2018.

CLINICAL DATA: Hypercalcemia.  Weight loss.

EXAM:
NUCLEAR MEDICINE WHOLE BODY BONE SCAN
TECHNIQUE: Whole body anterior and posterior images were obtained approximately
3 hours after intravenous injection of radiopharmaceutical.
RADIOPHARMACEUTICALS:  18.9 millicuries mCi Pechnetium-IIm MDP IV

[Series 1: whole body · 2.66mm/px · 1 of 1 slices shown (1 of 2)]
[im 1/1]
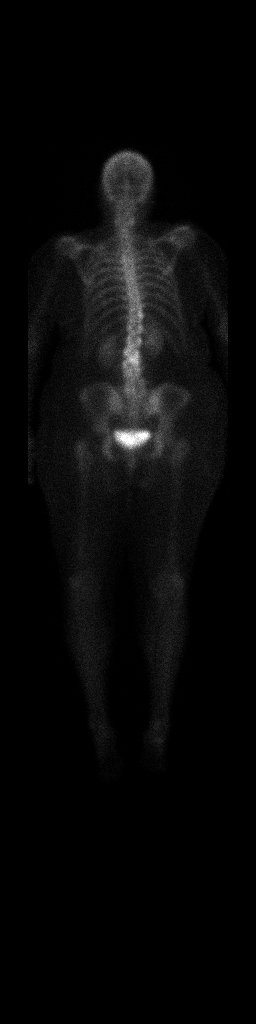

[Series 1: whole body · 2.66mm/px · 1 of 1 slices shown (2 of 2)]
[im 1/1]
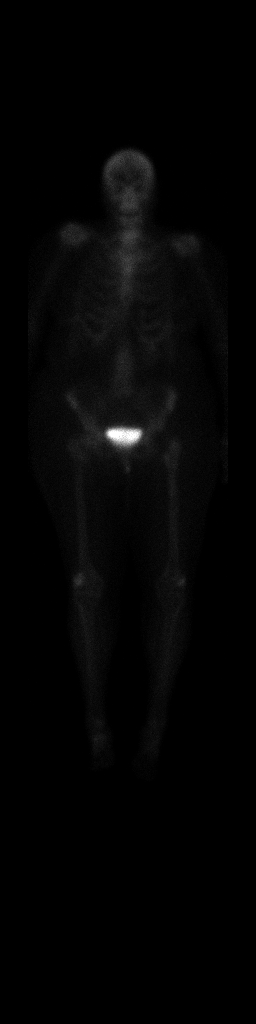

[2 of 2 positions shown; findings below may reference images not displayed]

FINDINGS: Bilateral renal function and excretion. Mild increased activity
noted in the thoracic and lumbar spine, most likely degenerative.
Mild increased activity noted over both knees most likely
degenerative. No other focal abnormalities identified.
IMPRESSION: Mild increased activity thoracic and lumbar spine and both knees,
most likely degenerative. No other focal abnormalities identified.

## 2018-12-03 IMAGING — CT CT ABD-PELV W/ CM
2 of 5 series · 15 of 46 positions shown, 17 images · IV contrast (APPLIED)
Comparison: None.

CLINICAL DATA: Anemia, weight loss. Elevated hemoglobin and
hematocrit

EXAM:
CT CHEST, ABDOMEN, AND PELVIS WITH CONTRAST
TECHNIQUE: Multidetector CT imaging of the chest, abdomen and pelvis was
performed following the standard protocol during bolus
administration of intravenous contrast.
CONTRAST:  100mL OMNIPAQUE IOHEXOL 300 MG/ML  SOLN

[Series 2: cap with · axial · 0.83mm/px · z∈[-556,-56]mm · 12 of 120 slices shown, 14 images]
[im 10/120  soft-tissue]
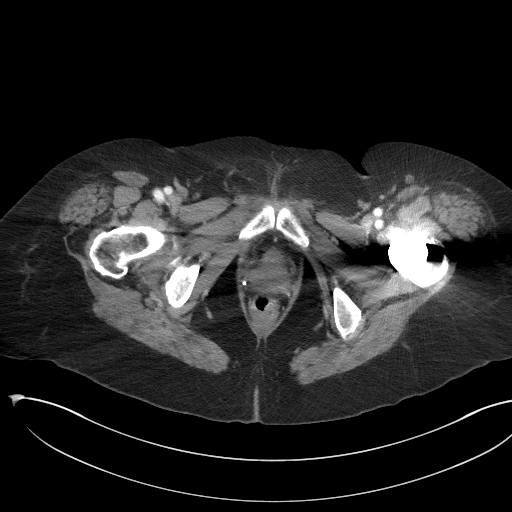
[im 10/120  bone]
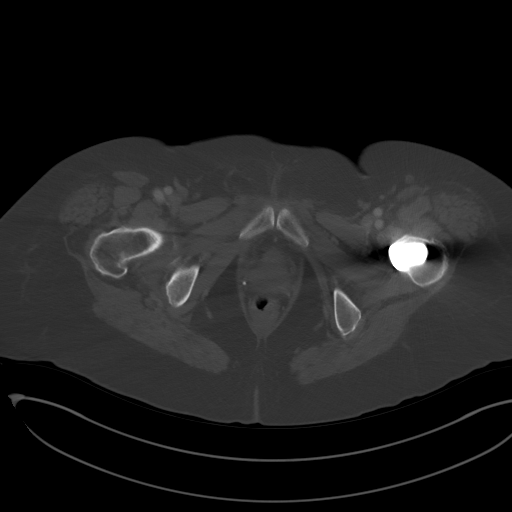
[im 19/120  soft-tissue]
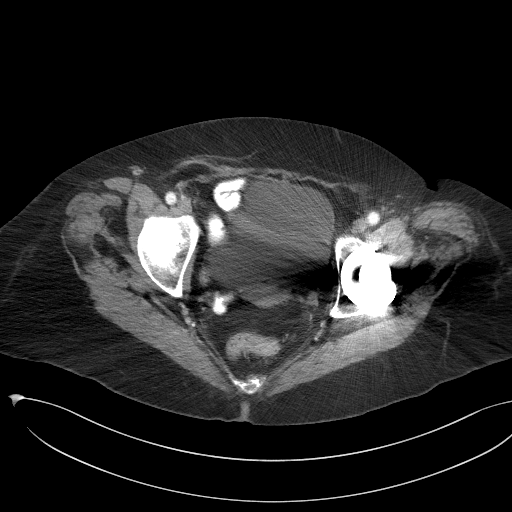
[im 28/120  soft-tissue]
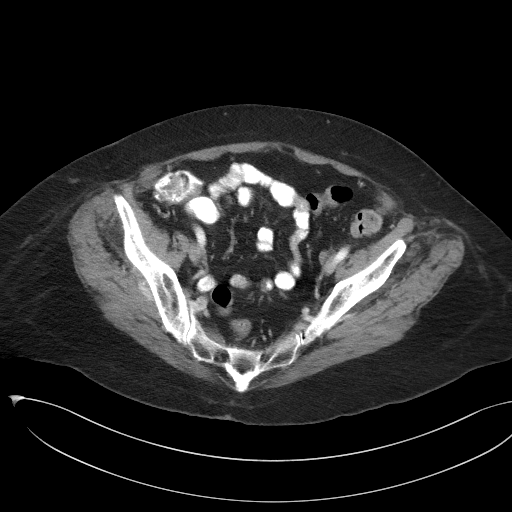
[im 37/120  soft-tissue]
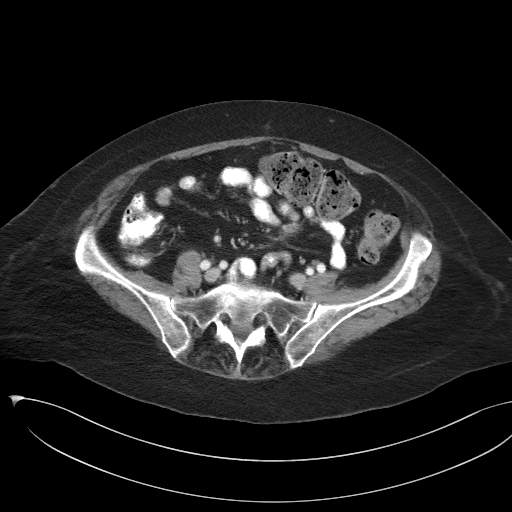
[im 46/120  soft-tissue]
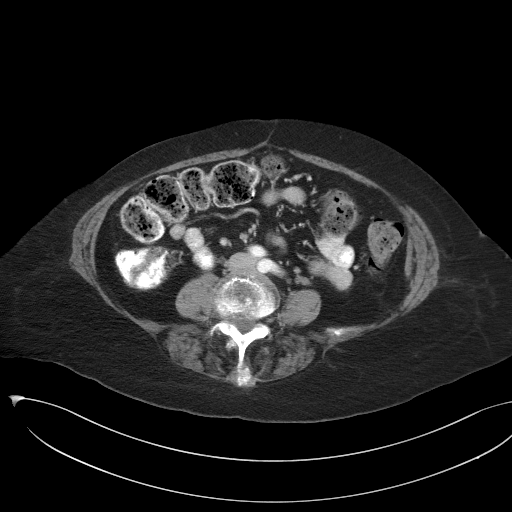
[im 55/120  soft-tissue]
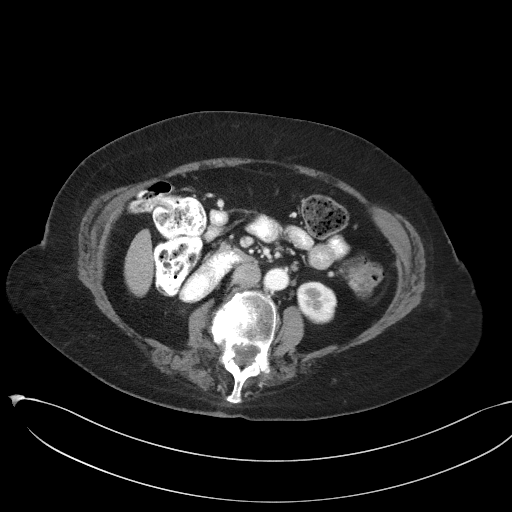
[im 65/120  soft-tissue]
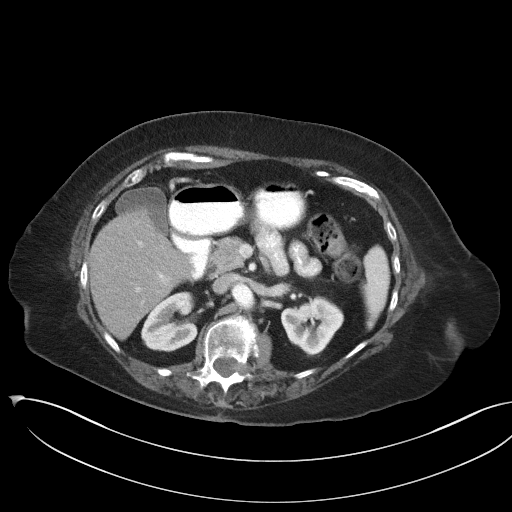
[im 74/120  soft-tissue]
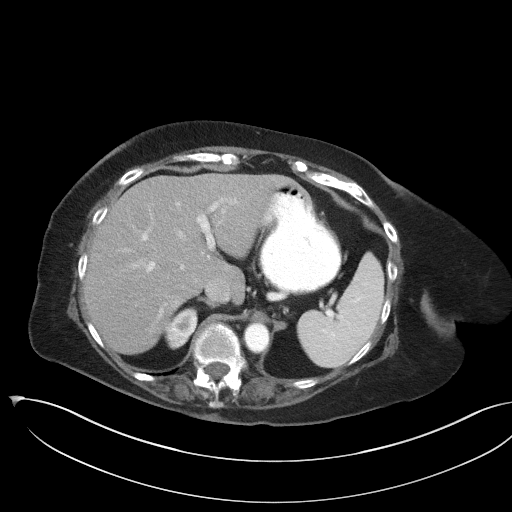
[im 83/120  soft-tissue]
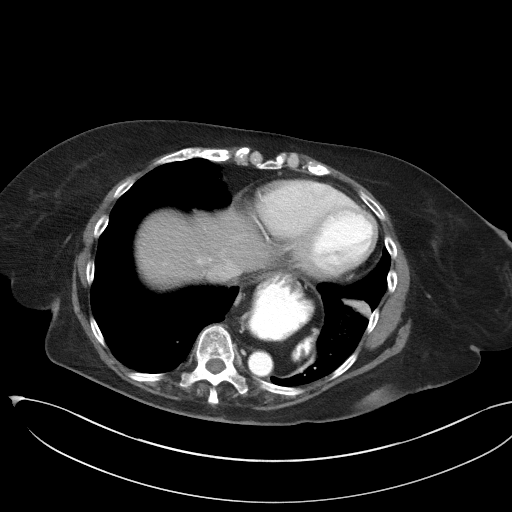
[im 83/120  bone]
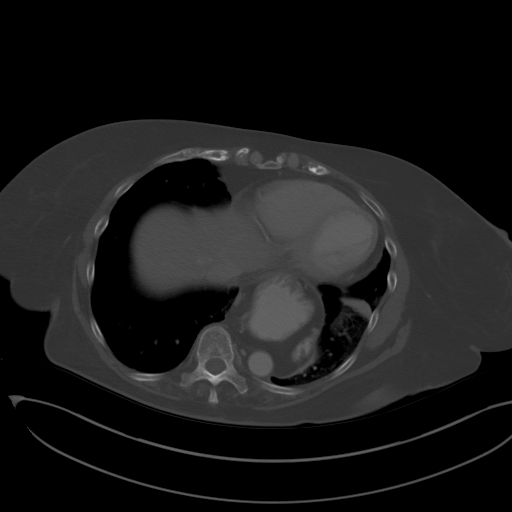
[im 92/120  soft-tissue]
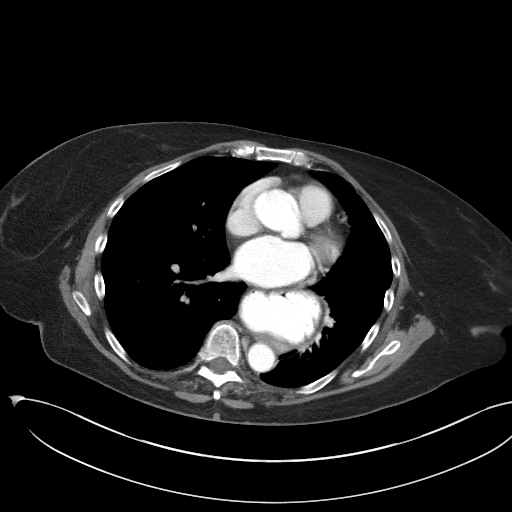
[im 101/120  soft-tissue]
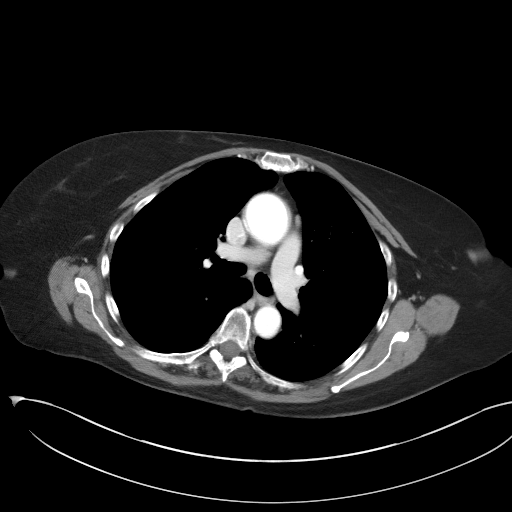
[im 110/120  soft-tissue]
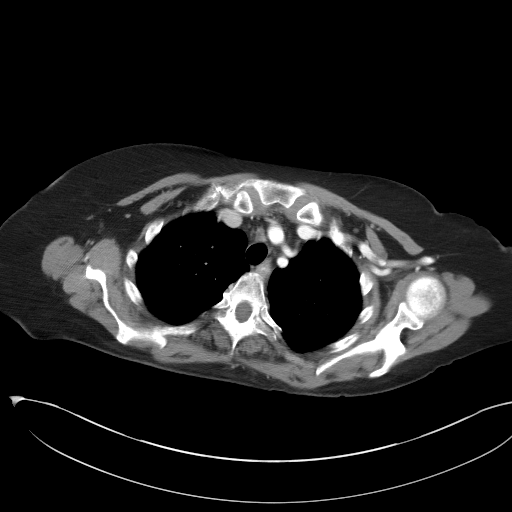

[Series 5: coronals · coronal · 0.83mm/px · 3 of 128 slices shown]
[im 43/128  soft-tissue]
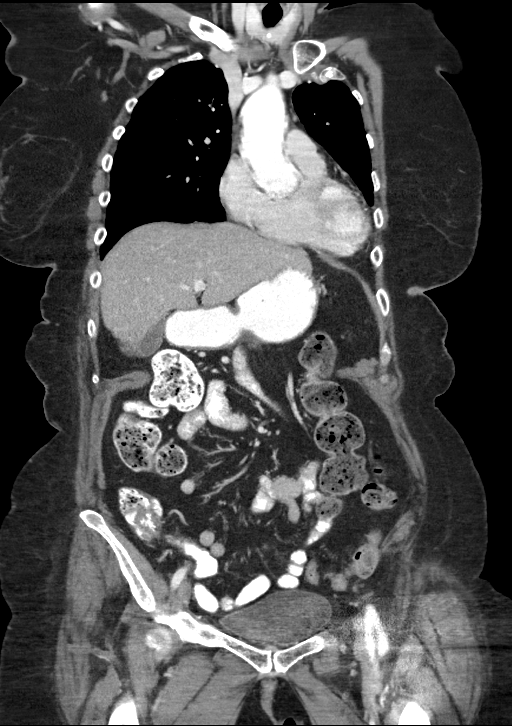
[im 57/128  soft-tissue]
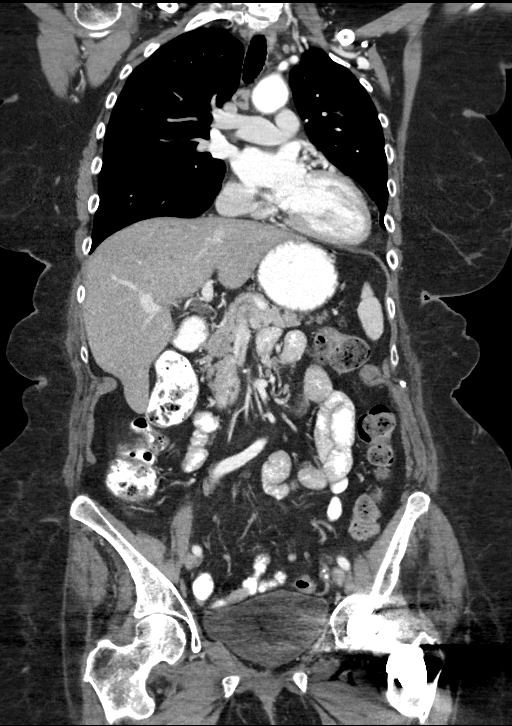
[im 71/128  soft-tissue]
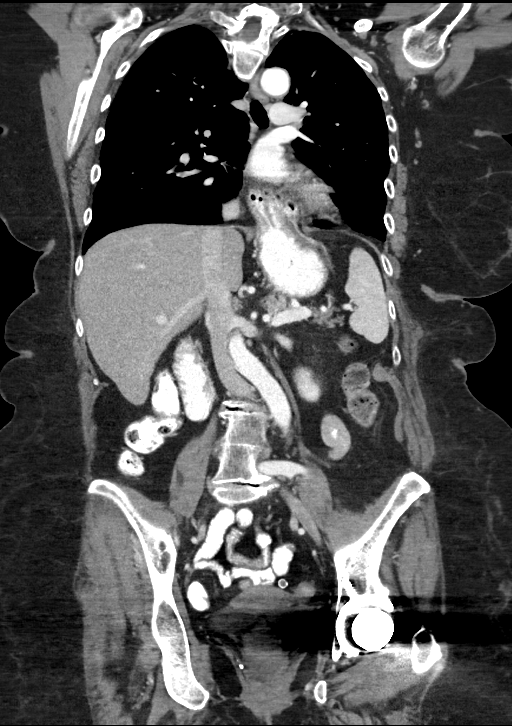

[15 of 46 positions shown; findings below may reference images not displayed]

FINDINGS: CT CHEST FINDINGS

Cardiovascular: No significant vascular findings. Normal heart size.
No pericardial effusion.

Mediastinum/Nodes: No axillary supraclavicular adenopathy. No
mediastinal hilar adenopathy effusion

Large hiatal hernia with approximately half the stomach within the
posterior mediastinum behind the heart.

Lungs/Pleura: No suspicious pulmonary nodules. Mild atelectasis in
LEFT lower lobe related to the large hiatal hernia.

Musculoskeletal: No aggressive osseous lesion.

CT ABDOMEN AND PELVIS FINDINGS

Hepatobiliary: No focal hepatic lesion. No biliary ductal
dilatation. Gallbladder is normal. Common bile duct is normal.

Pancreas: Pancreas is normal. No ductal dilatation. No pancreatic
inflammation.

Spleen: Normal spleen

Adrenals/urinary tract: Adrenal glands and kidneys are normal. The
ureters and bladder normal.

Stomach/Bowel: Large hiatal hernia described in the chest section.
Duodenum and small-bowel normal. Appendix not identified. Ascending
transverse colon contains a moderate volume stool. Descending colon
collapse. No obstructing lesion.

Vascular/Lymphatic: Abdominal aorta is normal caliber. There is no
retroperitoneal or periportal lymphadenopathy. No pelvic
lymphadenopathy.

Reproductive: Uterus and ovaries normal

Other: No free fluid.

Musculoskeletal: No aggressive osseous lesion.
IMPRESSION: Chest Impression:

1. No thoracic adenopathy or malignancy identified.
2. Large hiatal hernia with half the stomach above the hemidiaphragm

Abdomen / Pelvis Impression:

1. No hepatosplenomegaly.
2. No lymphadenopathy.
3. No aggressive osseous lesion.  No plasmacytoma

## 2018-12-14 DIAGNOSIS — F411 Generalized anxiety disorder: Secondary | ICD-10-CM | POA: Diagnosis not present

## 2018-12-28 DIAGNOSIS — F411 Generalized anxiety disorder: Secondary | ICD-10-CM | POA: Diagnosis not present

## 2019-01-26 DIAGNOSIS — D6489 Other specified anemias: Secondary | ICD-10-CM | POA: Diagnosis not present

## 2019-02-02 DIAGNOSIS — F411 Generalized anxiety disorder: Secondary | ICD-10-CM | POA: Diagnosis not present

## 2019-02-14 ENCOUNTER — Other Ambulatory Visit: Payer: Self-pay | Admitting: Obstetrics & Gynecology

## 2019-02-14 DIAGNOSIS — E2839 Other primary ovarian failure: Secondary | ICD-10-CM

## 2019-02-16 DIAGNOSIS — F411 Generalized anxiety disorder: Secondary | ICD-10-CM | POA: Diagnosis not present

## 2019-03-02 DIAGNOSIS — F411 Generalized anxiety disorder: Secondary | ICD-10-CM | POA: Diagnosis not present

## 2019-03-07 DIAGNOSIS — H43811 Vitreous degeneration, right eye: Secondary | ICD-10-CM | POA: Diagnosis not present

## 2019-03-17 DIAGNOSIS — F411 Generalized anxiety disorder: Secondary | ICD-10-CM | POA: Diagnosis not present

## 2019-03-24 ENCOUNTER — Encounter (HOSPITAL_BASED_OUTPATIENT_CLINIC_OR_DEPARTMENT_OTHER): Payer: Self-pay | Admitting: *Deleted

## 2019-03-24 ENCOUNTER — Other Ambulatory Visit: Payer: Self-pay

## 2019-03-28 ENCOUNTER — Other Ambulatory Visit (HOSPITAL_COMMUNITY)
Admission: RE | Admit: 2019-03-28 | Discharge: 2019-03-28 | Disposition: A | Discharge status: Home / Self Care | Payer: Medicare Other | Source: Ambulatory Visit | Attending: Plastic Surgery | Admitting: Plastic Surgery

## 2019-03-28 ENCOUNTER — Encounter (HOSPITAL_BASED_OUTPATIENT_CLINIC_OR_DEPARTMENT_OTHER)
Admission: RE | Admit: 2019-03-28 | Discharge: 2019-03-28 | Disposition: A | Discharge status: Home / Self Care | Payer: Medicare Other | Source: Ambulatory Visit | Attending: Plastic Surgery | Admitting: Plastic Surgery

## 2019-03-28 ENCOUNTER — Other Ambulatory Visit: Payer: Self-pay

## 2019-03-28 DIAGNOSIS — Z1159 Encounter for screening for other viral diseases: Secondary | ICD-10-CM | POA: Insufficient documentation

## 2019-03-28 DIAGNOSIS — Z01818 Encounter for other preprocedural examination: Secondary | ICD-10-CM | POA: Insufficient documentation

## 2019-03-28 DIAGNOSIS — F411 Generalized anxiety disorder: Secondary | ICD-10-CM | POA: Diagnosis not present

## 2019-03-28 LAB — BASIC METABOLIC PANEL
Anion gap: 8 (ref 5–15)
BUN: 19 mg/dL (ref 8–23)
CO2: 24 mmol/L (ref 22–32)
Calcium: 9.7 mg/dL (ref 8.9–10.3)
Chloride: 104 mmol/L (ref 98–111)
Creatinine, Ser: 0.96 mg/dL (ref 0.44–1.00)
GFR calc Af Amer: >60 mL/min (ref >60)
GFR calc non Af Amer: 58 mL/min — ABNORMAL LOW (ref >60)
Glucose, Bld: 108 mg/dL — ABNORMAL HIGH (ref 70–99)
Potassium: 4.4 mmol/L (ref 3.5–5.1)
Sodium: 136 mmol/L (ref 135–145)

## 2019-03-28 LAB — CBC WITH DIFFERENTIAL/PLATELET
Abs Immature Granulocytes: 0.04 10*3/uL (ref 0.00–0.07)
Basophils Absolute: 0.1 10*3/uL (ref 0.0–0.1)
Basophils Relative: 1 %
Eosinophils Absolute: 0.2 10*3/uL (ref 0.0–0.5)
Eosinophils Relative: 2 %
HCT: 23.6 % — ABNORMAL LOW (ref 36.0–46.0)
Hemoglobin: 6.6 g/dL — CL (ref 12.0–15.0)
Immature Granulocytes: 1 %
Lymphocytes Relative: 22 %
Lymphs Abs: 1.7 10*3/uL (ref 0.7–4.0)
MCH: 21.6 pg — ABNORMAL LOW (ref 26.0–34.0)
MCHC: 28 g/dL — ABNORMAL LOW (ref 30.0–36.0)
MCV: 77.4 fL — ABNORMAL LOW (ref 80.0–100.0)
Monocytes Absolute: 0.9 10*3/uL (ref 0.1–1.0)
Monocytes Relative: 11 %
Neutro Abs: 5.2 10*3/uL (ref 1.7–7.7)
Neutrophils Relative %: 63 %
Platelets: 311 10*3/uL (ref 150–400)
RBC: 3.05 MIL/uL — ABNORMAL LOW (ref 3.87–5.11)
RDW: 18.5 % — ABNORMAL HIGH (ref 11.5–15.5)
WBC: 8.1 10*3/uL (ref 4.0–10.5)
nRBC: 0.2 % (ref 0.0–0.2)

## 2019-03-28 NOTE — Progress Notes (Signed)
Ensure pre surgery drink given with instructions to complete by [facility], surgical soap given with instructions, pt verbalized understanding.

## 2019-03-28 NOTE — Progress Notes (Signed)
Lab called critical H&H of 6.6 and 23.6. IM sent to Dr Iran Planas to make her aware. Surgery 03/31/19.

## 2019-03-29 ENCOUNTER — Encounter (HOSPITAL_COMMUNITY): Payer: Self-pay | Admitting: Emergency Medicine

## 2019-03-29 ENCOUNTER — Other Ambulatory Visit: Payer: Self-pay

## 2019-03-29 ENCOUNTER — Inpatient Hospital Stay (HOSPITAL_COMMUNITY)
Admission: EM | Admit: 2019-03-29 | Discharge: 2019-03-31 | DRG: 378 | Disposition: A | Discharge status: Home / Self Care | Payer: Medicare Other | Attending: Family Medicine | Admitting: Family Medicine

## 2019-03-29 DIAGNOSIS — G4700 Insomnia, unspecified: Secondary | ICD-10-CM | POA: Diagnosis not present

## 2019-03-29 DIAGNOSIS — D509 Iron deficiency anemia, unspecified: Secondary | ICD-10-CM | POA: Diagnosis present

## 2019-03-29 DIAGNOSIS — D62 Acute posthemorrhagic anemia: Secondary | ICD-10-CM | POA: Diagnosis not present

## 2019-03-29 DIAGNOSIS — Z1159 Encounter for screening for other viral diseases: Secondary | ICD-10-CM

## 2019-03-29 DIAGNOSIS — K922 Gastrointestinal hemorrhage, unspecified: Secondary | ICD-10-CM | POA: Diagnosis not present

## 2019-03-29 DIAGNOSIS — E785 Hyperlipidemia, unspecified: Secondary | ICD-10-CM | POA: Diagnosis not present

## 2019-03-29 DIAGNOSIS — E669 Obesity, unspecified: Secondary | ICD-10-CM | POA: Diagnosis present

## 2019-03-29 DIAGNOSIS — F329 Major depressive disorder, single episode, unspecified: Secondary | ICD-10-CM | POA: Diagnosis present

## 2019-03-29 DIAGNOSIS — M1990 Unspecified osteoarthritis, unspecified site: Secondary | ICD-10-CM | POA: Diagnosis present

## 2019-03-29 DIAGNOSIS — Z8711 Personal history of peptic ulcer disease: Secondary | ICD-10-CM

## 2019-03-29 DIAGNOSIS — G4733 Obstructive sleep apnea (adult) (pediatric): Secondary | ICD-10-CM | POA: Diagnosis present

## 2019-03-29 DIAGNOSIS — M810 Age-related osteoporosis without current pathological fracture: Secondary | ICD-10-CM | POA: Diagnosis present

## 2019-03-29 DIAGNOSIS — D649 Anemia, unspecified: Secondary | ICD-10-CM | POA: Diagnosis present

## 2019-03-29 DIAGNOSIS — I5032 Chronic diastolic (congestive) heart failure: Secondary | ICD-10-CM | POA: Diagnosis present

## 2019-03-29 DIAGNOSIS — Z6833 Body mass index (BMI) 33.0-33.9, adult: Secondary | ICD-10-CM

## 2019-03-29 DIAGNOSIS — Z8719 Personal history of other diseases of the digestive system: Secondary | ICD-10-CM | POA: Diagnosis not present

## 2019-03-29 DIAGNOSIS — I340 Nonrheumatic mitral (valve) insufficiency: Secondary | ICD-10-CM | POA: Diagnosis present

## 2019-03-29 DIAGNOSIS — Z880 Allergy status to penicillin: Secondary | ICD-10-CM

## 2019-03-29 DIAGNOSIS — Z96642 Presence of left artificial hip joint: Secondary | ICD-10-CM | POA: Diagnosis present

## 2019-03-29 DIAGNOSIS — K449 Diaphragmatic hernia without obstruction or gangrene: Secondary | ICD-10-CM | POA: Diagnosis present

## 2019-03-29 DIAGNOSIS — Z8262 Family history of osteoporosis: Secondary | ICD-10-CM

## 2019-03-29 DIAGNOSIS — Z8249 Family history of ischemic heart disease and other diseases of the circulatory system: Secondary | ICD-10-CM

## 2019-03-29 DIAGNOSIS — K921 Melena: Secondary | ICD-10-CM | POA: Diagnosis not present

## 2019-03-29 DIAGNOSIS — K210 Gastro-esophageal reflux disease with esophagitis: Secondary | ICD-10-CM | POA: Diagnosis present

## 2019-03-29 DIAGNOSIS — Z79899 Other long term (current) drug therapy: Secondary | ICD-10-CM

## 2019-03-29 DIAGNOSIS — Z8261 Family history of arthritis: Secondary | ICD-10-CM

## 2019-03-29 DIAGNOSIS — I110 Hypertensive heart disease with heart failure: Secondary | ICD-10-CM | POA: Diagnosis present

## 2019-03-29 LAB — ABO/RH: ABO/RH(D): O POS

## 2019-03-29 LAB — COMPREHENSIVE METABOLIC PANEL
ALT: 19 U/L (ref 0–44)
AST: 16 U/L (ref 15–41)
Albumin: 3.5 g/dL (ref 3.5–5.0)
Alkaline Phosphatase: 70 U/L (ref 38–126)
Anion gap: 7 (ref 5–15)
BUN: 19 mg/dL (ref 8–23)
CO2: 24 mmol/L (ref 22–32)
Calcium: 9.5 mg/dL (ref 8.9–10.3)
Chloride: 108 mmol/L (ref 98–111)
Creatinine, Ser: 0.83 mg/dL (ref 0.44–1.00)
GFR calc Af Amer: >60 mL/min (ref >60)
GFR calc non Af Amer: >60 mL/min (ref >60)
Glucose, Bld: 102 mg/dL — ABNORMAL HIGH (ref 70–99)
Potassium: 4.2 mmol/L (ref 3.5–5.1)
Sodium: 139 mmol/L (ref 135–145)
Total Bilirubin: 1 mg/dL (ref 0.3–1.2)
Total Protein: 6.1 g/dL — ABNORMAL LOW (ref 6.5–8.1)

## 2019-03-29 LAB — CBC WITH DIFFERENTIAL/PLATELET
Abs Immature Granulocytes: 0.03 10*3/uL (ref 0.00–0.07)
Basophils Absolute: 0 10*3/uL (ref 0.0–0.1)
Basophils Relative: 1 %
Eosinophils Absolute: 0.2 10*3/uL (ref 0.0–0.5)
Eosinophils Relative: 3 %
HCT: 22.8 % — ABNORMAL LOW (ref 36.0–46.0)
Hemoglobin: 6.3 g/dL — CL (ref 12.0–15.0)
Immature Granulocytes: 1 %
Lymphocytes Relative: 18 %
Lymphs Abs: 1.2 10*3/uL (ref 0.7–4.0)
MCH: 21.6 pg — ABNORMAL LOW (ref 26.0–34.0)
MCHC: 27.6 g/dL — ABNORMAL LOW (ref 30.0–36.0)
MCV: 78.1 fL — ABNORMAL LOW (ref 80.0–100.0)
Monocytes Absolute: 0.6 10*3/uL (ref 0.1–1.0)
Monocytes Relative: 9 %
Neutro Abs: 4.6 10*3/uL (ref 1.7–7.7)
Neutrophils Relative %: 68 %
Platelets: 279 10*3/uL (ref 150–400)
RBC: 2.92 MIL/uL — ABNORMAL LOW (ref 3.87–5.11)
RDW: 18.6 % — ABNORMAL HIGH (ref 11.5–15.5)
WBC: 6.6 10*3/uL (ref 4.0–10.5)
nRBC: 0 % (ref 0.0–0.2)

## 2019-03-29 LAB — NOVEL CORONAVIRUS, NAA (HOSP ORDER, SEND-OUT TO REF LAB; TAT 18-24 HRS): SARS-CoV-2, NAA: NOT DETECTED

## 2019-03-29 LAB — PREPARE RBC (CROSSMATCH)

## 2019-03-29 LAB — POC OCCULT BLOOD, ED: Fecal Occult Bld: POSITIVE — AB

## 2019-03-29 MED ORDER — ONDANSETRON HCL 4 MG PO TABS: 4.0000 mg | ORAL_TABLET | Freq: Four times a day (QID) | ORAL | Status: DC | PRN

## 2019-03-29 MED ORDER — SODIUM CHLORIDE 0.9 % IV SOLN: 10.0000 mL/h | Freq: Once | INTRAVENOUS | Status: DC

## 2019-03-29 MED ORDER — DULOXETINE HCL 60 MG PO CPEP
60.0000 mg | ORAL_CAPSULE | Freq: Every day | ORAL | Status: DC
  Administered 2019-03-29 – 2019-03-30 (×2): 60 mg via ORAL
  Filled 2019-03-29 (×2): qty 1

## 2019-03-29 MED ORDER — ALBUTEROL SULFATE (2.5 MG/3ML) 0.083% IN NEBU: 2.5000 mg | INHALATION_SOLUTION | Freq: Four times a day (QID) | RESPIRATORY_TRACT | Status: DC | PRN

## 2019-03-29 MED ORDER — DOXEPIN HCL 10 MG/ML PO CONC
6.0000 mg | Freq: Every day | ORAL | Status: DC
  Administered 2019-03-30: 6 mg via ORAL
  Filled 2019-03-29 (×3): qty 0.6

## 2019-03-29 MED ORDER — ACETAMINOPHEN 325 MG PO TABS: 650.0000 mg | ORAL_TABLET | Freq: Four times a day (QID) | ORAL | Status: DC | PRN

## 2019-03-29 MED ORDER — ONDANSETRON HCL 4 MG/2ML IJ SOLN: 4.0000 mg | Freq: Four times a day (QID) | INTRAMUSCULAR | Status: DC | PRN

## 2019-03-29 MED ORDER — FAMOTIDINE 20 MG PO TABS
20.0000 mg | ORAL_TABLET | Freq: Two times a day (BID) | ORAL | Status: DC
  Administered 2019-03-29 – 2019-03-31 (×4): 20 mg via ORAL
  Filled 2019-03-29 (×4): qty 1

## 2019-03-29 MED ORDER — PRAVASTATIN SODIUM 10 MG PO TABS
20.0000 mg | ORAL_TABLET | Freq: Every day | ORAL | Status: DC
  Administered 2019-03-30: 20 mg via ORAL
  Filled 2019-03-29: qty 2

## 2019-03-29 MED ORDER — PRAVASTATIN SODIUM 10 MG PO TABS: 20.0000 mg | ORAL_TABLET | Freq: Every day | ORAL | Status: DC

## 2019-03-29 MED ORDER — DULOXETINE HCL 60 MG PO CPEP: 60.0000 mg | ORAL_CAPSULE | Freq: Every day | ORAL | Status: DC

## 2019-03-29 MED ORDER — ACETAMINOPHEN 650 MG RE SUPP: 650.0000 mg | Freq: Four times a day (QID) | RECTAL | Status: DC | PRN

## 2019-03-29 MED ORDER — DOXEPIN HCL 6 MG PO TABS: 1.0000 | ORAL_TABLET | Freq: Every day | ORAL | Status: DC

## 2019-03-29 MED ORDER — FAMOTIDINE 20 MG PO TABS: 20.0000 mg | ORAL_TABLET | Freq: Two times a day (BID) | ORAL | Status: DC

## 2019-03-29 MED ORDER — SODIUM CHLORIDE 0.9% FLUSH
3.0000 mL | Freq: Two times a day (BID) | INTRAVENOUS | Status: DC
  Administered 2019-03-29 – 2019-03-31 (×3): 3 mL via INTRAVENOUS

## 2019-03-29 MED ORDER — PANTOPRAZOLE SODIUM 40 MG PO TBEC: 40.0000 mg | DELAYED_RELEASE_TABLET | Freq: Two times a day (BID) | ORAL | Status: DC

## 2019-03-29 NOTE — H&P (View-Only) (Signed)
[facility] Consult: 2:22 PM 03/29/2019  LOS: 0 days    Referring Provider: Dr Stark Jock MD.  Molly Lucas.    Primary Care Physician:  [name], MD Primary Gastroenterologist:  Dr. Angela Nevin.  Unassigned for this admission    Reason for Consultation:  Anemia.  FOBT +   HPI: Molly Lucas is a 73 y.o. female.  Hx mild OSA on sleep study of 06/2018. Iron deficiency anemia.  Treated in the past with parenteral iron infusion.  S/p R inguinal hernia repairs 04/2013, 08/2013.  .   2D echo 11/2015 showed LVEF 55 to 78%, grade 2 diastolic dysfunction, mild to moderate mitral valve regurgitation and trivial pericardial effusion.  Colonoscopy: 1) 2005: Negative. 2) 2010: Negative. 3) 2015: Negative. Esophago-gastro-duodenoscopy (EGD): 1) 2015: Medium - sized hiatus hernia. Duodenal biopsy - negative. 2) 11/2015 for IDA, Hgb 5.4.  Nonbleeding ulcer at GE junction.  Mild gastritis, mild duodenitis, medium hiatal hernia. 3) 2019: Esophagitis, hiatal hernia with paraesophageal component, erosive gastritis of fundus and antrum, non-erosive duodenitis 02/2018 CT abdomen pelvis revealed large hiatal hernia with half the stomach above hemidiaphragm Patient previously treated with PPI but now takes Pepcid 40 mg daily.  Does not use aspirin, NSAIDs. Last parenteral iron infusion was 10/27/2018, arranged by her PMD.  She has not tolerated p.o. iron due to GI upset in the past.  Planning for breast reduction surgery on 5/22. Pre-op labs yesterday with  Hgb 6.6 >> 6.3 today. Was 10 early 01/2019 just prior to Covid sequestering . BUN not elevated, no renal insufficiency. On DRE stool is scant, dark, not melenic.  FOBT positive. Within the last week or more, has seen some formed, dark stool.  These were not tarry/melenic as  they were when she had GI bleed in 2017 No nausea, anorexia.  Infrequent, mild heartburn.  No weight loss.  No abdominal pain.  Some weakness, minor lightheadedness, minor DOE.    Past Medical History:  Diagnosis Date  . Anemia    Takes iron supplement  . Anxiety   . Arthritis   . Chronic pain of left thumb   . Constipation   . 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 Depression   . Fibroid, uterine   . GERD (gastroesophageal reflux disease)   . History of blood transfusion   . Hx of seasonal allergies   . Hyperlipemia   . Hypertension    hx of off medication now.  . OA (osteoarthritis) of hip    left  . Sleep apnea    dentist made mouthipiece     Past Surgical History:  Procedure Laterality Date  . CARPOMETACARPEL SUSPENSION PLASTY  08/2015   with abductor pollicis longus tendon transfer and suspensionplasty  . DE QUERVAIN'S RELEASE  08/2015  . DILATATION & CURRETTAGE/HYSTEROSCOPY WITH RESECTOCOPE  2007  . ESOPHAGOGASTRODUODENOSCOPY N/A 11/30/2015   Procedure: ESOPHAGOGASTRODUODENOSCOPY (EGD);  Surgeon: Richmond Campbell, MD;  Location: [facility];  Service: Endoscopy;  Laterality: N/A;  . HERNIA REPAIR Left 06/2012  .  INGUINAL HERNIA REPAIR Right 05/03/2013   Procedure: HERNIA REPAIR INGUINAL ADULT;  Surgeon: Harl Bowie, MD;  Location: [place];  Service: General;  Laterality: Right;  . INGUINAL HERNIA REPAIR Right 08/23/2013   Procedure: LAPAROSCOPIC INGUINAL HERNIA;  Surgeon: Harl Bowie, MD;  Location: [place];  Service: General;  Laterality: Right;  . INSERTION OF MESH Right 05/03/2013   Procedure: INSERTION OF MESH;  Surgeon: Harl Bowie, MD;  Location: [place];  Service: General;  Laterality: Right;  . INSERTION OF MESH Right 08/23/2013   Procedure: INSERTION OF MESH;  Surgeon: Harl Bowie, MD;  Location: [place];  Service: General;  Laterality: Right;  . METACARPOPHALANGEAL JOINT ARTHRODESIS  08/2015    with local bone graft  . TONSILLECTOMY    . TOTAL HIP ARTHROPLASTY  06/24/2012   Procedure: TOTAL HIP ARTHROPLASTY ANTERIOR APPROACH;  Surgeon: Mcarthur Rossetti, MD;  Location: [facility];  Service: Orthopedics;  Laterality: Left;  Left total hip arthroplasty, anterior approach  . UTERINE FIBROID SURGERY  2007  . WISDOM TOOTH EXTRACTION      Prior to Admission medications   Medication Sig Start Date End Date Taking? Authorizing Provider  CALCIUM PO Take 2 tablets by mouth daily.     [provider]  cholecalciferol (VITAMIN D) 1000 UNITS tablet Take 2,000 Units by mouth daily.    [provider]  Cinnamon 500 MG capsule Take 500 mg by mouth daily.    [provider]  Doxepin HCl (SILENOR) 6 MG TABS Take 1 tablet by mouth at bedtime.    [provider]  DULoxetine (CYMBALTA) 60 MG capsule Take 60 mg by mouth at bedtime.    [provider]  famotidine (PEPCID) 20 MG tablet Take 20 mg by mouth at bedtime.    [provider]  Garlic 3546 MG CAPS Take by mouth.    [provider]  Multiple Vitamins-Minerals (HAIR/SKIN/NAILS) TABS Take 1 tablet by mouth daily.    [provider]  multivitamin-lutein (OCUVITE-LUTEIN) CAPS Take 1 capsule by mouth daily.    [provider]  omega-3 acid ethyl esters (LOVAZA) 1 G capsule Take 2 g by mouth daily.    [provider]  pravastatin (PRAVACHOL) 20 MG tablet Take 20 mg by mouth daily.    [provider]  Probiotic Product (PROBIOTIC DAILY PO) Take 1 capsule by mouth 2 (two) times daily.     [provider]  Turmeric 500 MG CAPS Take by mouth.    [provider]  vitamin C (ASCORBIC ACID) 500 MG tablet Take 500 mg by mouth daily.    [provider]    Scheduled Meds:  Infusions: . sodium chloride     PRN Meds:    Allergies as of 03/29/2019 - Review Complete 03/29/2019  Allergen Reaction Noted  . Penicillins Other (See  Comments) 06/13/2012    Family History  Problem Relation Age of Onset  . Heart failure Mother   . Osteoporosis Mother   . Arthritis Mother   . CAD Maternal Grandfather   . Heart failure Maternal Grandfather   . Dementia Maternal Grandfather   . CAD Paternal Grandmother   . CAD Paternal Grandfather   . Arthritis Sister     Social History   Socioeconomic History  . Marital status: Widowed    Spouse name: Not on file  . Number of children: Not on file  . Years of education: Not on file  . Highest education  level: Not on file  Occupational History  . Not on file  Social Needs  . Financial resource strain: Not on file  . Food insecurity:    Worry: Not on file    Inability: Not on file  . Transportation needs:    Medical: Not on file    Non-medical: Not on file  Tobacco Use  . Smoking status: Never Smoker  . Smokeless tobacco: Never Used  Substance and Sexual Activity  . Alcohol use: Yes    Alcohol/week: 2.0 standard drinks    Types: 2 Glasses of wine per week    Comment: occasional glass of wine   . Drug use: No  . Sexual activity: Not on file  Lifestyle  . Physical activity:    Days per week: Not on file    Minutes per session: Not on file  . Stress: Not on file  Relationships  . Social connections:    Talks on phone: Not on file    Gets together: Not on file    Attends religious service: Not on file    Active member of club or organization: Not on file    Attends meetings of clubs or organizations: Not on file    Relationship status: Not on file  . Intimate partner violence:    Fear of current or ex partner: Not on file    Emotionally abused: Not on file    Physically abused: Not on file    Forced sexual activity: Not on file  Other Topics Concern  . Not on file  Social History Narrative  . Not on file    REVIEW OF SYSTEMS: Constitutional: Per HPI. ENT:  No nose bleeds Pulm: Per HPI. CV:  No palpitations, no LE edema.  No chest pain GU:  No  hematuria, no frequency GI: Per HPI. Heme: No unusual or excessive bleeding or bruising. Transfusions: Has not required blood transfusions since 2017 but receives parenteral iron infusions about once a year. Neuro:  No headaches, no peripheral tingling or numbness.  Slight lightheadedness.  No syncope. Derm:  No itching, no rash or sores.  Endocrine:  No sweats or chills.  No polyuria or dysuria Immunization: Not queried. Travel:  None beyond local counties in last few months.    PHYSICAL EXAM: Vital signs in last 24 hours: Vitals:   03/29/19 1215 03/29/19 1230  BP: (!) 105/58 114/71  Pulse: 75 80  Resp: 16 15  SpO2: 97% 99%   Wt Readings from Last 3 Encounters:  10/27/18 79.4 kg  10/21/18 79.4 kg  06/20/18 76.2 kg    General: Obese, comfortable, non-ill-appearing slightly pale WF. Head: No facial asymmetry or swelling.  No signs of head trauma. Eyes: No conjunctival pallor.  EOMI. Ears: Not hard of hearing. Nose: No discharge or congestion. Mouth: Moist, clear, pink oral mucosa.  Tongue midline. Neck: No JVD, no masses, Lungs: Clear bilaterally. Heart: RRR.  No MRG.  S1, S2 present. Abdomen: Soft. NT. ND.  Active bowel sounds.  No masses, no HSM. Rectal: DRE performed by ED staff.  There was scant dark stool that tested FOBT positive.  No masses. Musc/Skeltl: No joint redness, swelling, gross deformity. Extremities: No CCE. Neurologic: Fully alert and oriented.  Good historian.  Moves all 4 limbs.  No limb weakness or tremor.  No gross deficits. Skin: Rashes, no telangiectasia, no suspicious lesions. Tattoos: None Nodes: No cervical adenopathy. Psych: Cooperative, calm, pleasant.  Intake/Output from previous day: No intake/output data recorded. Intake/Output this shift:  No intake/output data recorded.  LAB RESULTS: Recent Labs    03/28/19 1523 03/29/19 1241  WBC 8.1 6.6  HGB 6.6* 6.3*  HCT 23.6* 22.8*  PLT 311 279   BMET Lab Results  Component Value Date    NA 139 03/29/2019   NA 136 03/28/2019   NA 141 02/01/2018   K 4.2 03/29/2019   K 4.4 03/28/2019   K 3.7 02/01/2018   CL 108 03/29/2019   CL 104 03/28/2019   CL 105 02/01/2018   CO2 24 03/29/2019   CO2 24 03/28/2019   CO2 28 02/01/2018   GLUCOSE 102 (H) 03/29/2019   GLUCOSE 108 (H) 03/28/2019   GLUCOSE 104 02/01/2018   BUN 19 03/29/2019   BUN 19 03/28/2019   BUN 12 02/01/2018   CREATININE 0.83 03/29/2019   CREATININE 0.96 03/28/2019   CREATININE 1.07 02/01/2018   CALCIUM 9.5 03/29/2019   CALCIUM 9.7 03/28/2019   CALCIUM 10.9 (H) 02/01/2018   CALCIUM 11.0 (H) 02/01/2018   LFT Recent Labs    03/29/19 1241  PROT 6.1*  ALBUMIN 3.5  AST 16  ALT 19  ALKPHOS 70  BILITOT 1.0   PT/INR Lab Results  Component Value Date   INR 1.16 07/05/2012   INR 1.12 07/04/2012   INR 1.02 07/03/2012     RADIOLOGY STUDIES: No results found.    IMPRESSION:   *    Acute recurrence of anemia.  Microcytic.   FOBT positive dark stool. Longstanding history IDA.  Treated with occasional parenteral iron, last dose 10/2018. gastric ulcer 2017, hiatal hernia which is large by CT and EGD 1 year ago .  Esophago-gastro-duodenitis in 2019. Current acid reduction regimen of famotidine 40 mg daily. Suspect she may be bleeding from Cameron's erosions.    PLAN:     *   Obtain iron studies prior to receiving 1 PRBC which was ordered by ED staff.  *     She does not need IV Protonix, will add Protonix 40 mg p.o. twice daily  *     Repeat hgb in the morning  *     May eat tonight and n.p.o. after midnight  *      EGD arranged for 945 tomorrow morning, discussed with patient who is agreeable to proceed.   Azucena Freed  03/29/2019, 2:22 PM Phone [phone]  Attending Attestation:  I have independently seen and evaluated the patient today. No family was present at the time of my evaluation. I discussed the patient's care with PA Gribbin and I agree with her documentation below.    Mrs. Derstine has a longstanding history of iron deficiency anemia. Has had multiple prior endoscopies with Dr. Allyn Kenner. Admitted now with progressive microcytic anemia with FOBT + stools without over bleeding. No localizing signs or symptoms.   Originally discussed EGD and colonoscopy with patient. However, without enough time to prep for the colonoscopy and suspected upper etiologies such as Cameron's erosions, I have recommended an EGD 03/30/19 and a possible colonoscopy 03/31/19 if no source is identified on EGD.   I consented the patient at the bedside today discussing the risks, benefits, and alternatives to endoscopic evaluation. In particular, we discussed the risks that include, but are not limited to, reaction to medication, cardiopulmonary compromise, bleeding requiring blood transfusion, aspiration resulting in pneumonia, perforation requiring surgery, lack of diagnosis, severe illness requiring hospitalization, and even death. We reviewed the risk of missed lesion including polyps or even cancer. The patient acknowledges these  risks and asks that we proceed.  Thornton Park, MD, MPH [facility]

## 2019-03-29 NOTE — ED Notes (Signed)
REport given to [facility] on 5W, patient alert, oriented and stable for transport.

## 2019-03-29 NOTE — ED Triage Notes (Signed)
w as to have breast reduction yesterday , had her labs drawn yesterday and was called today and told her her HGB is  Molly Lucas

## 2019-03-29 NOTE — ED Provider Notes (Signed)
[facility] EMERGENCY DEPARTMENT Provider Note   CSN: [id number] Arrival date & time: 03/29/19  1139    History   Chief Complaint Chief Complaint  Patient presents with  . Anemia    HPI Molly Lucas is a 73 y.o. female.     Patient is a 73 year old female with past medical history of hypertension, hyperlipidemia, and anemia presenting to the emergency department for abnormal lab found.  Patient was scheduled to have breast reduction surgery on Friday and had routine labs drawn yesterday.  Reports that her hemoglobin was near 6.  Patient reports that she has had a history of anemia in the past requiring blood transfusions.  Reports that in the past she did have a GI bleed that was causing the anemia.  Reports that she continued to have anemia even after her GI bleed resolved and was receiving iron infusions as well.  Reports that she last received an iron infusion in December and had a hemoglobin of 10 in March.  Reports not having her hemoglobin checked again until yesterday.  She does report that she has been feeling more fatigued, short of breath and a little bit lightheaded with standing.  Denies any dark, tarry stools, hematemesis, diarrhea, blood in her stool.     Past Medical History:  Diagnosis Date  . Anemia    Takes iron supplement  . Anxiety   . Arthritis   . Chronic pain of left thumb   . Constipation   . 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 Depression   . Fibroid, uterine   . GERD (gastroesophageal reflux disease)   . History of blood transfusion   . Hx of seasonal allergies   . Hyperlipemia   . Hypertension    hx of off medication now.  . OA (osteoarthritis) of hip    left  . Sleep apnea    dentist made mouthipiece     Patient Active Problem List   Diagnosis Date Noted  . Excessive daytime sleepiness 06/20/2018  . SOB (shortness of breath) on exertion 06/20/2018  . Fatigue due to  depression 06/20/2018  . OSA (obstructive sleep apnea) 06/20/2018  . Trigger thumb of right hand 04/29/2017  . Iron deficiency anemia 04/09/2016  . Hyperlipidemia 02/26/2016  . Family history of heart disease 02/26/2016  . Trivial Pericardial effusion 02/26/2016  . Anemia 11/29/2015  . Symptomatic anemia 11/29/2015  . Dyspnea   . Melena   . Occult blood in stools   . Recurrent right inguinal hernia 08/15/2013  . Right inguinal hernia 03/14/2013  . Degenerative arthritis of hip 06/24/2012    Past Surgical History:  Procedure Laterality Date  . CARPOMETACARPEL SUSPENSION PLASTY  08/2015   with abductor pollicis longus tendon transfer and suspensionplasty  . DE QUERVAIN'S RELEASE  08/2015  . DILATATION & CURRETTAGE/HYSTEROSCOPY WITH RESECTOCOPE  2007  . ESOPHAGOGASTRODUODENOSCOPY N/A 11/30/2015   Procedure: ESOPHAGOGASTRODUODENOSCOPY (EGD);  Surgeon: Richmond Campbell, MD;  Location: [facility];  Service: Endoscopy;  Laterality: N/A;  . HERNIA REPAIR Left 06/2012  . INGUINAL HERNIA REPAIR Right 05/03/2013   Procedure: HERNIA REPAIR INGUINAL ADULT;  Surgeon: Harl Bowie, MD;  Location: [facility];  Service: General;  Laterality: Right;  . INGUINAL HERNIA REPAIR Right 08/23/2013   Procedure: LAPAROSCOPIC INGUINAL HERNIA;  Surgeon: Harl Bowie, MD;  Location: [facility];  Service: General;  Laterality: Right;  . INSERTION OF MESH Right 05/03/2013   Procedure:  INSERTION OF MESH;  Surgeon: Harl Bowie, MD;  Location: [facility];  Service: General;  Laterality: Right;  . INSERTION OF MESH Right 08/23/2013   Procedure: INSERTION OF MESH;  Surgeon: Harl Bowie, MD;  Location: [facility];  Service: General;  Laterality: Right;  . METACARPOPHALANGEAL JOINT ARTHRODESIS  08/2015   with local bone graft  . TONSILLECTOMY    . TOTAL HIP ARTHROPLASTY  06/24/2012   Procedure: TOTAL HIP ARTHROPLASTY ANTERIOR APPROACH;  Surgeon: Mcarthur Rossetti, MD;  Location: [facility];  Service: Orthopedics;   Laterality: Left;  Left total hip arthroplasty, anterior approach  . UTERINE FIBROID SURGERY  2007  . WISDOM TOOTH EXTRACTION       OB History   No obstetric history on file.      Home Medications    Prior to Admission medications   Medication Sig Start Date End Date Taking? Authorizing Provider  Camphor-Eucalyptus-Menthol (VICKS VAPORUB) 1.8-5.6-3.1 % OINT 1 application See admin instructions. Apply under the nostrils at bedtime   Yes [provider]  Doxepin HCl (SILENOR) 6 MG TABS Take 6 mg by mouth at bedtime.    Yes [provider]  DULoxetine (CYMBALTA) 30 MG capsule Take 60 mg by mouth at bedtime. 03/12/19  Yes [provider]  famotidine (PEPCID) 20 MG tablet Take 20 mg by mouth at bedtime.   Yes [provider]  pravastatin (PRAVACHOL) 20 MG tablet Take 20 mg by mouth at bedtime.    Yes [provider]  Probiotic Product (PROBIOTIC DAILY PO) Take 1 capsule by mouth daily.    Yes [provider]  CALCIUM PO Take 2 tablets by mouth daily.     [provider]  Cholecalciferol (VITAMIN D3) 250 MCG (10000 UT) TABS Take 10,000 Units by mouth daily.     [provider]  Cinnamon 500 MG capsule Take 500 mg by mouth daily.    [provider]  Garlic 4970 MG CAPS Take 2,000 mg by mouth daily.     [provider]  Multiple Vitamins-Minerals (HAIR/SKIN/NAILS) TABS Take 1 tablet by mouth daily.    [provider]  multivitamin-lutein (OCUVITE-LUTEIN) CAPS Take 1 capsule by mouth daily.    [provider]  NON FORMULARY Take 1 capsule by mouth See admin instructions. Black [place] oil capsules: Take 1 capsule by mouth once a day    [provider]  Omega-3 1000 MG CAPS Take 2,000 mg by mouth daily.     [provider]  Turmeric 500 MG CAPS Take 500 mg by mouth daily.     [provider]  vitamin C (ASCORBIC ACID) 500 MG tablet Take 500 mg by mouth daily.     [provider]    Family History Family History  Problem Relation Age of Onset  . Heart failure Mother   . Osteoporosis Mother   . Arthritis Mother   . CAD Maternal Grandfather   . Heart failure Maternal Grandfather   . Dementia Maternal Grandfather   . CAD Paternal Grandmother   . CAD Paternal Grandfather   . Arthritis Sister     Social History Social History   Tobacco Use  . Smoking status: Never Smoker  . Smokeless tobacco: Never Used  Substance Use Topics  . Alcohol use: Yes    Alcohol/week: 2.0 standard drinks    Types: 2 Glasses of wine per week    Comment: occasional glass of wine   . Drug use: No  Allergies   Penicillins   Review of Systems Review of Systems  Constitutional: Negative.   HENT: Negative for nosebleeds and rhinorrhea.   Respiratory: Negative for cough and shortness of breath.   Gastrointestinal: Negative for anal bleeding, blood in stool, diarrhea and vomiting.  Genitourinary: Negative for dysuria.  Musculoskeletal: Negative for arthralgias and back pain.  Skin: Negative for pallor.  Allergic/Immunologic: Negative for immunocompromised state.  Neurological: Positive for light-headedness. Negative for dizziness and headaches.  Hematological: Does not bruise/bleed easily.     Physical Exam Updated Vital Signs BP 111/75   Pulse 76   Temp 98.3 F (36.8 C) (Oral)   Resp 16   SpO2 100%   Physical Exam Vitals signs and nursing note reviewed.  Constitutional:      Appearance: Normal appearance.  HENT:     Head: Normocephalic.     Mouth/Throat:     Mouth: Mucous membranes are moist.  Eyes:     Conjunctiva/sclera: Conjunctivae normal.  Pulmonary:     Effort: Pulmonary effort is normal.  Skin:    General: Skin is dry.     Capillary Refill: Capillary refill takes less than 2 seconds.  Neurological:     Mental Status: She is alert.  Psychiatric:        Mood and Affect: Mood normal.      ED Treatments / Results   Labs (all labs ordered are listed, but only abnormal results are displayed) Labs Reviewed  CBC WITH DIFFERENTIAL/PLATELET - Abnormal; Notable for the following components:      Result Value   RBC 2.92 (*)    Hemoglobin 6.3 (*)    HCT 22.8 (*)    MCV 78.1 (*)    MCH 21.6 (*)    MCHC 27.6 (*)    RDW 18.6 (*)    All other components within normal limits  COMPREHENSIVE METABOLIC PANEL - Abnormal; Notable for the following components:   Glucose, Bld 102 (*)    Total Protein 6.1 (*)    All other components within normal limits  POC OCCULT BLOOD, ED - Abnormal; Notable for the following components:   Fecal Occult Bld POSITIVE (*)    All other components within normal limits  SARS CORONAVIRUS 2 (HOSPITAL ORDER, [facility])  VITAMIN B12  FOLATE  IRON AND TIBC  FERRITIN  RETICULOCYTES  CBC  BASIC METABOLIC PANEL  TYPE AND SCREEN  PREPARE RBC (CROSSMATCH)  ABO/RH    EKG None  Radiology No results found.  Procedures Procedures (including critical care time)  Medications Ordered in ED Medications  0.9 %  sodium chloride infusion (has no administration in time range)  sodium chloride flush (NS) 0.9 % injection 3 mL (has no administration in time range)  ondansetron (ZOFRAN) tablet 4 mg (has no administration in time range)    Or  ondansetron (ZOFRAN) injection 4 mg (has no administration in time range)  acetaminophen (TYLENOL) tablet 650 mg (has no administration in time range)    Or  acetaminophen (TYLENOL) suppository 650 mg (has no administration in time range)  albuterol (PROVENTIL) (2.5 MG/3ML) 0.083% nebulizer solution 2.5 mg (has no administration in time range)  Doxepin HCl TABS 6 mg (has no administration in time range)  DULoxetine (CYMBALTA) DR capsule 60 mg (has no administration in time range)  famotidine (PEPCID) tablet 20 mg (has no administration in time range)  pravastatin (PRAVACHOL) tablet 20 mg (has no administration in time  range)     Initial  Impression / Assessment and Plan / ED Course  I have reviewed the triage vital signs and the nursing notes.  Pertinent labs & imaging results that were available during my care of the patient were reviewed by me and considered in my medical decision making (see chart for details).  Clinical Course as of Mar 28 1821  Wed Mar 29, 2019  1412 Patient with history of anemia presents to the emergency department for the same.  Hemoglobin of 6.3.  Hemoccult positive.  Hemodynamically stable with a blood pressure 125/70.  Patient alert and reading on her phone.  Will initiate transfusion and call GI.  Has had a history of a gastric ulcer in the past.  Not on anticoagulation or taking aspirin or NSAIDs.   [KM]  0964 Patient remains hemodynamically stable.  Consulted with GI as well as hospitalist who will admit the patient and GI will consult on the patient.  Waiting on transfusion at this time.   [KM]    Clinical Course User Index [KM] Alveria Apley, PA-C       CRITICAL CARE Performed by: Alveria Apley   Total critical care time: 40 minutes  Critical care time was exclusive of separately billable procedures and treating other patients.  Critical care was necessary to treat or prevent imminent or life-threatening deterioration.  Critical care was time spent personally by me on the following activities: development of treatment plan with patient and/or surrogate as well as nursing, discussions with consultants, evaluation of patient's response to treatment, examination of patient, obtaining history from patient or surrogate, ordering and performing treatments and interventions, ordering and review of laboratory studies, ordering and review of radiographic studies, pulse oximetry and re-evaluation of patient's condition.   Final Clinical Impressions(s) / ED Diagnoses   Final diagnoses:  Symptomatic anemia  Gastrointestinal hemorrhage, unspecified gastrointestinal  hemorrhage type    ED Discharge Orders    None       Kristine Royal 03/29/19 Frances Furbish, MD 03/29/19 [phone]

## 2019-03-29 NOTE — Consult Note (Signed)
[facility] Consult: 2:22 PM 03/29/2019  LOS: 0 days    Referring Provider: Dr Stark Jock MD.  Molly Lucas.    Primary Care Physician:  [name], MD Primary Gastroenterologist:  Dr. Angela Nevin.  Unassigned for this admission    Reason for Consultation:  Anemia.  FOBT +   HPI: Molly Lucas is a 73 y.o. female.  Hx mild OSA on sleep study of 06/2018. Iron deficiency anemia.  Treated in the past with parenteral iron infusion.  S/p R inguinal hernia repairs 04/2013, 08/2013.  .   2D echo 11/2015 showed LVEF 55 to 66%, grade 2 diastolic dysfunction, mild to moderate mitral valve regurgitation and trivial pericardial effusion.  Colonoscopy: 1) 2005: Negative. 2) 2010: Negative. 3) 2015: Negative. Esophago-gastro-duodenoscopy (EGD): 1) 2015: Medium - sized hiatus hernia. Duodenal biopsy - negative. 2) 11/2015 for IDA, Hgb 5.4.  Nonbleeding ulcer at GE junction.  Mild gastritis, mild duodenitis, medium hiatal hernia. 3) 2019: Esophagitis, hiatal hernia with paraesophageal component, erosive gastritis of fundus and antrum, non-erosive duodenitis 02/2018 CT abdomen pelvis revealed large hiatal hernia with half the stomach above hemidiaphragm Patient previously treated with PPI but now takes Pepcid 40 mg daily.  Does not use aspirin, NSAIDs. Last parenteral iron infusion was 10/27/2018, arranged by her PMD.  She has not tolerated p.o. iron due to GI upset in the past.  Planning for breast reduction surgery on 5/22. Pre-op labs yesterday with  Hgb 6.6 >> 6.3 today. Was 10 early 01/2019 just prior to Covid sequestering . BUN not elevated, no renal insufficiency. On DRE stool is scant, dark, not melenic.  FOBT positive. Within the last week or more, has seen some formed, dark stool.  These were not tarry/melenic as  they were when she had GI bleed in 2017 No nausea, anorexia.  Infrequent, mild heartburn.  No weight loss.  No abdominal pain.  Some weakness, minor lightheadedness, minor DOE.    Past Medical History:  Diagnosis Date  . Anemia    Takes iron supplement  . Anxiety   . Arthritis   . Chronic pain of left thumb   . Constipation   . 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 Depression   . Fibroid, uterine   . GERD (gastroesophageal reflux disease)   . History of blood transfusion   . Hx of seasonal allergies   . Hyperlipemia   . Hypertension    hx of off medication now.  . OA (osteoarthritis) of hip    left  . Sleep apnea    dentist made mouthipiece     Past Surgical History:  Procedure Laterality Date  . CARPOMETACARPEL SUSPENSION PLASTY  08/2015   with abductor pollicis longus tendon transfer and suspensionplasty  . DE QUERVAIN'S RELEASE  08/2015  . DILATATION & CURRETTAGE/HYSTEROSCOPY WITH RESECTOCOPE  2007  . ESOPHAGOGASTRODUODENOSCOPY N/A 11/30/2015   Procedure: ESOPHAGOGASTRODUODENOSCOPY (EGD);  Surgeon: Richmond Campbell, MD;  Location: [facility];  Service: Endoscopy;  Laterality: N/A;  . HERNIA REPAIR Left 06/2012  .  INGUINAL HERNIA REPAIR Right 05/03/2013   Procedure: HERNIA REPAIR INGUINAL ADULT;  Surgeon: Harl Bowie, MD;  Location: [facility];  Service: General;  Laterality: Right;  . INGUINAL HERNIA REPAIR Right 08/23/2013   Procedure: LAPAROSCOPIC INGUINAL HERNIA;  Surgeon: Harl Bowie, MD;  Location: [place];  Service: General;  Laterality: Right;  . INSERTION OF MESH Right 05/03/2013   Procedure: INSERTION OF MESH;  Surgeon: Harl Bowie, MD;  Location: [place];  Service: General;  Laterality: Right;  . INSERTION OF MESH Right 08/23/2013   Procedure: INSERTION OF MESH;  Surgeon: Harl Bowie, MD;  Location: [place];  Service: General;  Laterality: Right;  . METACARPOPHALANGEAL JOINT ARTHRODESIS  08/2015    with local bone graft  . TONSILLECTOMY    . TOTAL HIP ARTHROPLASTY  06/24/2012   Procedure: TOTAL HIP ARTHROPLASTY ANTERIOR APPROACH;  Surgeon: Mcarthur Rossetti, MD;  Location: [facility];  Service: Orthopedics;  Laterality: Left;  Left total hip arthroplasty, anterior approach  . UTERINE FIBROID SURGERY  2007  . WISDOM TOOTH EXTRACTION      Prior to Admission medications   Medication Sig Start Date End Date Taking? Authorizing Provider  CALCIUM PO Take 2 tablets by mouth daily.     [provider]  cholecalciferol (VITAMIN D) 1000 UNITS tablet Take 2,000 Units by mouth daily.    [provider]  Cinnamon 500 MG capsule Take 500 mg by mouth daily.    [provider]  Doxepin HCl (SILENOR) 6 MG TABS Take 1 tablet by mouth at bedtime.    [provider]  DULoxetine (CYMBALTA) 60 MG capsule Take 60 mg by mouth at bedtime.    [provider]  famotidine (PEPCID) 20 MG tablet Take 20 mg by mouth at bedtime.    [provider]  Garlic 6144 MG CAPS Take by mouth.    [provider]  Multiple Vitamins-Minerals (HAIR/SKIN/NAILS) TABS Take 1 tablet by mouth daily.    [provider]  multivitamin-lutein (OCUVITE-LUTEIN) CAPS Take 1 capsule by mouth daily.    [provider]  omega-3 acid ethyl esters (LOVAZA) 1 G capsule Take 2 g by mouth daily.    [provider]  pravastatin (PRAVACHOL) 20 MG tablet Take 20 mg by mouth daily.    [provider]  Probiotic Product (PROBIOTIC DAILY PO) Take 1 capsule by mouth 2 (two) times daily.     [provider]  Turmeric 500 MG CAPS Take by mouth.    [provider]  vitamin C (ASCORBIC ACID) 500 MG tablet Take 500 mg by mouth daily.    [provider]    Scheduled Meds:  Infusions: . sodium chloride     PRN Meds:    Allergies as of 03/29/2019 - Review Complete 03/29/2019  Allergen Reaction Noted  . Penicillins Other (See  Comments) 06/13/2012    Family History  Problem Relation Age of Onset  . Heart failure Mother   . Osteoporosis Mother   . Arthritis Mother   . CAD Maternal Grandfather   . Heart failure Maternal Grandfather   . Dementia Maternal Grandfather   . CAD Paternal Grandmother   . CAD Paternal Grandfather   . Arthritis Sister     Social History   Socioeconomic History  . Marital status: Widowed    Spouse name: Not on file  . Number of children: Not on file  . Years of education: Not on file  . Highest education  level: Not on file  Occupational History  . Not on file  Social Needs  . Financial resource strain: Not on file  . Food insecurity:    Worry: Not on file    Inability: Not on file  . Transportation needs:    Medical: Not on file    Non-medical: Not on file  Tobacco Use  . Smoking status: Never Smoker  . Smokeless tobacco: Never Used  Substance and Sexual Activity  . Alcohol use: Yes    Alcohol/week: 2.0 standard drinks    Types: 2 Glasses of wine per week    Comment: occasional glass of wine   . Drug use: No  . Sexual activity: Not on file  Lifestyle  . Physical activity:    Days per week: Not on file    Minutes per session: Not on file  . Stress: Not on file  Relationships  . Social connections:    Talks on phone: Not on file    Gets together: Not on file    Attends religious service: Not on file    Active member of club or organization: Not on file    Attends meetings of clubs or organizations: Not on file    Relationship status: Not on file  . Intimate partner violence:    Fear of current or ex partner: Not on file    Emotionally abused: Not on file    Physically abused: Not on file    Forced sexual activity: Not on file  Other Topics Concern  . Not on file  Social History Narrative  . Not on file    REVIEW OF SYSTEMS: Constitutional: Per HPI. ENT:  No nose bleeds Pulm: Per HPI. CV:  No palpitations, no LE edema.  No chest pain GU:  No  hematuria, no frequency GI: Per HPI. Heme: No unusual or excessive bleeding or bruising. Transfusions: Has not required blood transfusions since 2017 but receives parenteral iron infusions about once a year. Neuro:  No headaches, no peripheral tingling or numbness.  Slight lightheadedness.  No syncope. Derm:  No itching, no rash or sores.  Endocrine:  No sweats or chills.  No polyuria or dysuria Immunization: Not queried. Travel:  None beyond local counties in last few months.    PHYSICAL EXAM: Vital signs in last 24 hours: Vitals:   03/29/19 1215 03/29/19 1230  BP: (!) 105/58 114/71  Pulse: 75 80  Resp: 16 15  SpO2: 97% 99%   Wt Readings from Last 3 Encounters:  10/27/18 79.4 kg  10/21/18 79.4 kg  06/20/18 76.2 kg    General: Obese, comfortable, non-ill-appearing slightly pale WF. Head: No facial asymmetry or swelling.  No signs of head trauma. Eyes: No conjunctival pallor.  EOMI. Ears: Not hard of hearing. Nose: No discharge or congestion. Mouth: Moist, clear, pink oral mucosa.  Tongue midline. Neck: No JVD, no masses, Lungs: Clear bilaterally. Heart: RRR.  No MRG.  S1, S2 present. Abdomen: Soft. NT. ND.  Active bowel sounds.  No masses, no HSM. Rectal: DRE performed by ED staff.  There was scant dark stool that tested FOBT positive.  No masses. Musc/Skeltl: No joint redness, swelling, gross deformity. Extremities: No CCE. Neurologic: Fully alert and oriented.  Good historian.  Moves all 4 limbs.  No limb weakness or tremor.  No gross deficits. Skin: Rashes, no telangiectasia, no suspicious lesions. Tattoos: None Nodes: No cervical adenopathy. Psych: Cooperative, calm, pleasant.  Intake/Output from previous day: No intake/output data recorded. Intake/Output this shift:  No intake/output data recorded.  LAB RESULTS: Recent Labs    03/28/19 1523 03/29/19 1241  WBC 8.1 6.6  HGB 6.6* 6.3*  HCT 23.6* 22.8*  PLT 311 279   BMET Lab Results  Component Value Date    NA 139 03/29/2019   NA 136 03/28/2019   NA 141 02/01/2018   K 4.2 03/29/2019   K 4.4 03/28/2019   K 3.7 02/01/2018   CL 108 03/29/2019   CL 104 03/28/2019   CL 105 02/01/2018   CO2 24 03/29/2019   CO2 24 03/28/2019   CO2 28 02/01/2018   GLUCOSE 102 (H) 03/29/2019   GLUCOSE 108 (H) 03/28/2019   GLUCOSE 104 02/01/2018   BUN 19 03/29/2019   BUN 19 03/28/2019   BUN 12 02/01/2018   CREATININE 0.83 03/29/2019   CREATININE 0.96 03/28/2019   CREATININE 1.07 02/01/2018   CALCIUM 9.5 03/29/2019   CALCIUM 9.7 03/28/2019   CALCIUM 10.9 (H) 02/01/2018   CALCIUM 11.0 (H) 02/01/2018   LFT Recent Labs    03/29/19 1241  PROT 6.1*  ALBUMIN 3.5  AST 16  ALT 19  ALKPHOS 70  BILITOT 1.0   PT/INR Lab Results  Component Value Date   INR 1.16 07/05/2012   INR 1.12 07/04/2012   INR 1.02 07/03/2012     RADIOLOGY STUDIES: No results found.    IMPRESSION:   *    Acute recurrence of anemia.  Microcytic.   FOBT positive dark stool. Longstanding history IDA.  Treated with occasional parenteral iron, last dose 10/2018. gastric ulcer 2017, hiatal hernia which is large by CT and EGD 1 year ago .  Esophago-gastro-duodenitis in 2019. Current acid reduction regimen of famotidine 40 mg daily. Suspect she may be bleeding from Cameron's erosions.    PLAN:     *   Obtain iron studies prior to receiving 1 PRBC which was ordered by ED staff.  *     She does not need IV Protonix, will add Protonix 40 mg p.o. twice daily  *     Repeat hgb in the morning  *     May eat tonight and n.p.o. after midnight  *      EGD arranged for 945 tomorrow morning, discussed with patient who is agreeable to proceed.   Azucena Freed  03/29/2019, 2:22 PM Phone [phone]  Attending Attestation:  I have independently seen and evaluated the patient today. No family was present at the time of my evaluation. I discussed the patient's care with PA Gribbin and I agree with her documentation below.    Molly Lucas has a longstanding history of iron deficiency anemia. Has had multiple prior endoscopies with Dr. Allyn Kenner. Admitted now with progressive microcytic anemia with FOBT + stools without over bleeding. No localizing signs or symptoms.   Originally discussed EGD and colonoscopy with patient. However, without enough time to prep for the colonoscopy and suspected upper etiologies such as Cameron's erosions, I have recommended an EGD 03/30/19 and a possible colonoscopy 03/31/19 if no source is identified on EGD.   I consented the patient at the bedside today discussing the risks, benefits, and alternatives to endoscopic evaluation. In particular, we discussed the risks that include, but are not limited to, reaction to medication, cardiopulmonary compromise, bleeding requiring blood transfusion, aspiration resulting in pneumonia, perforation requiring surgery, lack of diagnosis, severe illness requiring hospitalization, and even death. We reviewed the risk of missed lesion including polyps or even cancer. The patient acknowledges these  risks and asks that we proceed.  Thornton Park, MD, MPH [facility]

## 2019-03-29 NOTE — H&P (Signed)
History and Physical    Molly Lucas QQI:[id number] DOB: 15-Jul-1946 DOA: 03/29/2019  Referring MD/NP/PA: Madilyn Hook, PA-C PCP: [name], MD  Patient coming from: Home  Chief Complaint: Low blood count  I have personally briefly reviewed patient's old medical records in [place]   HPI: Molly Lucas is a 73 y.o. female with medical history significant of HTN, HLD, iron deficiency anemia, gastric ulcer, and sleep apnea; who presents after being instructed by her surgeon for a low hemoglobin.  Patient was scheduled to have a breast reduction surgery on 5/22 with Dr. Iran Planas.  They had performed routine lab work yesterday along with [facility] 19 testing.  She was called this morning as hemoglobin resulted back at 6.6 g/dL and ground virus testing was negative.  Over the last few weeks she has been social distancing.  When going from her living room to her kitchen she reports feeling a little winded and short of breath.  Associated symptoms include dizziness intermittent abdominal discomfort, and  intermittent dark stools.  Denies any recent falls, chest pain, nausea, vomiting, or diarrhea.  She has a history of  GE junction ulcer and gastritis seen on EGD from in January 2017.  He has been on Pepcid and takes 20 mg nightly.  Denied any significant NSAID use.  ED Course: Upon admission into the emergency department patient was noted to have vital signs relatively within normal limits.  Labs revealed hemoglobin 6.3 g/dL.  Stool guaiac was positive.  Patient was typed and crossed along with ordered to be transfused 1 unit of blood.  [facility] was consulted to see the patient.  [facility] called to admit.   Review of Systems  Constitutional: Positive for malaise/fatigue. Negative for chills and fever.  HENT: Negative for congestion and sore throat.   Eyes: Positive for double vision. Negative for photophobia.  Respiratory: Positive for shortness of breath. Negative for cough.    Cardiovascular: Negative for chest pain and orthopnea.  Gastrointestinal: Positive for abdominal pain and melena. Negative for diarrhea, nausea and vomiting.  Genitourinary: Negative for dysuria and hematuria.  Musculoskeletal: Negative for joint pain and myalgias.  Skin: Negative for itching and rash.  Neurological: Positive for dizziness. Negative for focal weakness and loss of consciousness.  Psychiatric/Behavioral: Negative for memory loss and substance abuse.    Past Medical History:  Diagnosis Date  . Anemia    Takes iron supplement  . Anxiety   . Arthritis   . Chronic pain of left thumb   . Constipation   . 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 Depression   . Fibroid, uterine   . GERD (gastroesophageal reflux disease)   . History of blood transfusion   . Hx of seasonal allergies   . Hyperlipemia   . Hypertension    hx of off medication now.  . OA (osteoarthritis) of hip    left  . Sleep apnea    dentist made mouthipiece     Past Surgical History:  Procedure Laterality Date  . CARPOMETACARPEL SUSPENSION PLASTY  08/2015   with abductor pollicis longus tendon transfer and suspensionplasty  . DE QUERVAIN'S RELEASE  08/2015  . DILATATION & CURRETTAGE/HYSTEROSCOPY WITH RESECTOCOPE  2007  . ESOPHAGOGASTRODUODENOSCOPY N/A 11/30/2015   Procedure: ESOPHAGOGASTRODUODENOSCOPY (EGD);  Surgeon: Richmond Campbell, MD;  Location: [facility];  Service: Endoscopy;  Laterality: N/A;  . HERNIA REPAIR Left 06/2012  . INGUINAL HERNIA REPAIR Right 05/03/2013  Procedure: HERNIA REPAIR INGUINAL ADULT;  Surgeon: Harl Bowie, MD;  Location: [facility];  Service: General;  Laterality: Right;  . INGUINAL HERNIA REPAIR Right 08/23/2013   Procedure: LAPAROSCOPIC INGUINAL HERNIA;  Surgeon: Harl Bowie, MD;  Location: [facility];  Service: General;  Laterality: Right;  . INSERTION OF MESH Right 05/03/2013   Procedure: INSERTION OF MESH;  Surgeon:  Harl Bowie, MD;  Location: [facility];  Service: General;  Laterality: Right;  . INSERTION OF MESH Right 08/23/2013   Procedure: INSERTION OF MESH;  Surgeon: Harl Bowie, MD;  Location: [facility];  Service: General;  Laterality: Right;  . METACARPOPHALANGEAL JOINT ARTHRODESIS  08/2015   with local bone graft  . TONSILLECTOMY    . TOTAL HIP ARTHROPLASTY  06/24/2012   Procedure: TOTAL HIP ARTHROPLASTY ANTERIOR APPROACH;  Surgeon: Mcarthur Rossetti, MD;  Location: [facility];  Service: Orthopedics;  Laterality: Left;  Left total hip arthroplasty, anterior approach  . UTERINE FIBROID SURGERY  2007  . WISDOM TOOTH EXTRACTION       reports that she has never smoked. She has never used smokeless tobacco. She reports current alcohol use of about 2.0 standard drinks of alcohol per week. She reports that she does not use drugs.  Allergies  Allergen Reactions  . Penicillins Other (See Comments)    Tongue swelling  Has patient had a PCN reaction causing immediate rash, facial/tongue/throat swelling, SOB or lightheadedness with hypotension: Yes     Family History  Problem Relation Age of Onset  . Heart failure Mother   . Osteoporosis Mother   . Arthritis Mother   . CAD Maternal Grandfather   . Heart failure Maternal Grandfather   . Dementia Maternal Grandfather   . CAD Paternal Grandmother   . CAD Paternal Grandfather   . Arthritis Sister     Prior to Admission medications   Medication Sig Start Date End Date Taking? Authorizing Provider  CALCIUM PO Take 2 tablets by mouth daily.     [provider]  cholecalciferol (VITAMIN D) 1000 UNITS tablet Take 2,000 Units by mouth daily.    [provider]  Cinnamon 500 MG capsule Take 500 mg by mouth daily.    [provider]  Doxepin HCl (SILENOR) 6 MG TABS Take 1 tablet by mouth at bedtime.    [provider]  DULoxetine (CYMBALTA) 60 MG capsule Take 60 mg by mouth at bedtime.    [provider]   famotidine (PEPCID) 20 MG tablet Take 20 mg by mouth at bedtime.    [provider]  Garlic 1017 MG CAPS Take by mouth.    [provider]  Multiple Vitamins-Minerals (HAIR/SKIN/NAILS) TABS Take 1 tablet by mouth daily.    [provider]  multivitamin-lutein (OCUVITE-LUTEIN) CAPS Take 1 capsule by mouth daily.    [provider]  omega-3 acid ethyl esters (LOVAZA) 1 G capsule Take 2 g by mouth daily.    [provider]  pravastatin (PRAVACHOL) 20 MG tablet Take 20 mg by mouth daily.    [provider]  Probiotic Product (PROBIOTIC DAILY PO) Take 1 capsule by mouth 2 (two) times daily.     [provider]  Turmeric 500 MG CAPS Take by mouth.    [provider]  vitamin C (ASCORBIC ACID) 500 MG tablet Take 500 mg by mouth daily.    [provider]    Physical Exam:  Constitutional: Elderly female who appears to be in  no acute distress at this time Vitals:   03/29/19 1200 03/29/19 1215 03/29/19 1230  BP: 125/70 (!) 105/58 114/71  Pulse: 86 75 80  Resp:  16 15  SpO2: 100% 97% 99%   Eyes: PERRL, lids and conjunctivae normal ENMT: Mucous membranes are dry. Posterior pharynx clear of any exudate or lesions.   Neck: normal, supple, no masses, no thyromegaly Respiratory: clear to auscultation bilaterally, no wheezing, no crackles. Normal respiratory effort. No accessory muscle use.  Cardiovascular: Regular rate and rhythm, no murmurs / rubs / gallops. No extremity edema. 2+ pedal pulses. No carotid bruits.  Abdomen: no tenderness, no masses palpated. No hepatosplenomegaly. Bowel sounds positive.  Musculoskeletal: no clubbing / cyanosis. No joint deformity upper and lower extremities. Good ROM, no contractures. Normal muscle tone.  Skin: Pallor noted.  No rashes, lesions, ulcers. No induration Neurologic: CN 2-12 grossly intact. Sensation intact, DTR normal. Strength 5/5 in all 4.  Psychiatric: Normal judgment and  insight. Alert and oriented x 3. Normal mood.     Labs on Admission: I have personally reviewed following labs and imaging studies  CBC: Recent Labs  Lab 03/28/19 1523 03/29/19 1241  WBC 8.1 6.6  NEUTROABS 5.2 4.6  HGB 6.6* 6.3*  HCT 23.6* 22.8*  MCV 77.4* 78.1*  PLT 311 409   Basic Metabolic Panel: Recent Labs  Lab 03/28/19 1523 03/29/19 1241  NA 136 139  K 4.4 4.2  CL 104 108  CO2 24 24  GLUCOSE 108* 102*  BUN 19 19  CREATININE 0.96 0.83  CALCIUM 9.7 9.5   GFR: Estimated Creatinine Clearance: 56.7 mL/min (by C-G formula based on SCr of 0.83 mg/dL). Liver Function Tests: Recent Labs  Lab 03/29/19 1241  AST 16  ALT 19  ALKPHOS 70  BILITOT 1.0  PROT 6.1*  ALBUMIN 3.5   No results for input(s): LIPASE, AMYLASE in the last 168 hours. No results for input(s): AMMONIA in the last 168 hours. Coagulation Profile: No results for input(s): INR, PROTIME in the last 168 hours. Cardiac Enzymes: No results for input(s): CKTOTAL, CKMB, CKMBINDEX, TROPONINI in the last 168 hours. BNP (last 3 results) No results for input(s): PROBNP in the last 8760 hours. HbA1C: No results for input(s): HGBA1C in the last 72 hours. CBG: No results for input(s): GLUCAP in the last 168 hours. Lipid Profile: No results for input(s): CHOL, HDL, LDLCALC, TRIG, CHOLHDL, LDLDIRECT in the last 72 hours. Thyroid Function Tests: No results for input(s): TSH, T4TOTAL, FREET4, T3FREE, THYROIDAB in the last 72 hours. Anemia Panel: No results for input(s): VITAMINB12, FOLATE, FERRITIN, TIBC, IRON, RETICCTPCT in the last 72 hours. Urine analysis:    Component Value Date/Time   COLORURINE YELLOW 06/16/2012 1025   APPEARANCEUR CLOUDY (A) 06/16/2012 1025   LABSPEC 1.025 06/16/2012 1025   PHURINE 5.5 06/16/2012 1025   GLUCOSEU NEGATIVE 06/16/2012 1025   HGBUR NEGATIVE 06/16/2012 1025   BILIRUBINUR NEGATIVE 06/16/2012 1025   KETONESUR NEGATIVE 06/16/2012 1025   PROTEINUR NEGATIVE 06/16/2012  1025   UROBILINOGEN 0.2 06/16/2012 1025   NITRITE NEGATIVE 06/16/2012 1025   LEUKOCYTESUR MODERATE (A) 06/16/2012 1025   Sepsis Labs: Recent Results (from the past 240 hour(s))  Novel Coronavirus, NAA (hospital order; send-out to ref lab)     Status: None   Collection Time: 03/28/19  1:51 PM  Result Value Ref Range Status   SARS-CoV-2, NAA NOT DETECTED NOT DETECTED Final    Comment: (NOTE) Testing was performed using the cobas(R) SARS-CoV-2 test. This test was  developed and its performance characteristics determined by [employer]. This test has not been FDA cleared or approved. This test has been authorized by FDA under an Emergency Use Authorization (EUA). This test is only authorized for the duration of time the declaration that circumstances exist justifying the authorization of the emergency use of in vitro diagnostic tests for detection of SARS-CoV-2 virus and/or diagnosis of COVID-19 infection under section 564(b)(1) of the Act, 21 U.S.C. 710GYI-9(S)(8), unless the authorization is terminated or revoked sooner. When diagnostic testing is negative, the possibility of a false negative result should be considered in the context of a patient's recent exposures and the presence of clinical signs and symptoms consistent with COVID-19. An individual without symptoms of COVID-19 and who is not shedding SARS-CoV-2 virus would expect to have  a negative (not detected) result in this assay. Performed At: [facility] [address] [place], [place] [id number] Rush Farmer MD KX:[id number]    [place]  Final    Comment: Performed at [facility], [place] [address] [place], [place] [address]     Radiological Exams on Admission: No results found.  EKG: Independently reviewed from 5/19.  Normal sinus rhythm 91 bpm.  Assessment/Plan Acute blood loss anemia,  GI bleed, history of gastric ulcer: Patient presents with hemoglobin 6.3 g/dL.   Stool guaiacs were noted to be positive.  Previous history of GE junction ulcer and gastritis noted on EGD from 11/2015 patient was typed and screened and ordered to be transfused 1 unit of packed red blood cells.  [facility] consulted  -Admit to a telemetry bed -Clear liquid diet as tolerated -Per patient request Pepcid 20 mg p.o. twice daily using home medication -Follow-up anemia panel results -Continue transfusion of 1 unit of packed red blood cells -Appreciate GI consultative services  Depression -Continue Cymbalta  Insomnia -Continue doxepin  Hyperlipidemia -Continue pravastatin   GERD -Pepcid DVT prophylaxis: Scds Code Status: Full Family Communication: No family present at bedside Disposition Plan: Possible discharge home in a.m. Consults called: GI Admission status: Observation  Norval Morton MD [facility] Pager [phone]   If 7PM-7AM, please contact night-coverage [email or web address] Password [facility]  03/29/2019, 2:41 PM

## 2019-03-30 ENCOUNTER — Observation Stay (HOSPITAL_COMMUNITY): Payer: Medicare Other | Admitting: Certified Registered Nurse Anesthetist

## 2019-03-30 ENCOUNTER — Encounter (HOSPITAL_COMMUNITY): Payer: Self-pay | Admitting: Certified Registered Nurse Anesthetist

## 2019-03-30 ENCOUNTER — Encounter (HOSPITAL_COMMUNITY)
Admission: EM | Disposition: A | Discharge status: Home / Self Care | Payer: Self-pay | Source: Home / Self Care | Attending: Family Medicine

## 2019-03-30 DIAGNOSIS — K210 Gastro-esophageal reflux disease with esophagitis: Secondary | ICD-10-CM | POA: Diagnosis present

## 2019-03-30 DIAGNOSIS — K449 Diaphragmatic hernia without obstruction or gangrene: Secondary | ICD-10-CM

## 2019-03-30 DIAGNOSIS — E669 Obesity, unspecified: Secondary | ICD-10-CM | POA: Diagnosis present

## 2019-03-30 DIAGNOSIS — F419 Anxiety disorder, unspecified: Secondary | ICD-10-CM | POA: Diagnosis not present

## 2019-03-30 DIAGNOSIS — Z1159 Encounter for screening for other viral diseases: Secondary | ICD-10-CM | POA: Diagnosis not present

## 2019-03-30 DIAGNOSIS — M810 Age-related osteoporosis without current pathological fracture: Secondary | ICD-10-CM | POA: Diagnosis present

## 2019-03-30 DIAGNOSIS — Z8261 Family history of arthritis: Secondary | ICD-10-CM | POA: Diagnosis not present

## 2019-03-30 DIAGNOSIS — G4700 Insomnia, unspecified: Secondary | ICD-10-CM | POA: Diagnosis present

## 2019-03-30 DIAGNOSIS — I110 Hypertensive heart disease with heart failure: Secondary | ICD-10-CM | POA: Diagnosis present

## 2019-03-30 DIAGNOSIS — I340 Nonrheumatic mitral (valve) insufficiency: Secondary | ICD-10-CM | POA: Diagnosis present

## 2019-03-30 DIAGNOSIS — Z8249 Family history of ischemic heart disease and other diseases of the circulatory system: Secondary | ICD-10-CM | POA: Diagnosis not present

## 2019-03-30 DIAGNOSIS — K922 Gastrointestinal hemorrhage, unspecified: Secondary | ICD-10-CM | POA: Diagnosis present

## 2019-03-30 DIAGNOSIS — D649 Anemia, unspecified: Secondary | ICD-10-CM | POA: Diagnosis not present

## 2019-03-30 DIAGNOSIS — R195 Other fecal abnormalities: Secondary | ICD-10-CM | POA: Diagnosis not present

## 2019-03-30 DIAGNOSIS — F329 Major depressive disorder, single episode, unspecified: Secondary | ICD-10-CM | POA: Diagnosis present

## 2019-03-30 DIAGNOSIS — K279 Peptic ulcer, site unspecified, unspecified as acute or chronic, without hemorrhage or perforation: Secondary | ICD-10-CM | POA: Diagnosis not present

## 2019-03-30 DIAGNOSIS — D62 Acute posthemorrhagic anemia: Secondary | ICD-10-CM | POA: Diagnosis present

## 2019-03-30 DIAGNOSIS — Z8711 Personal history of peptic ulcer disease: Secondary | ICD-10-CM | POA: Diagnosis not present

## 2019-03-30 DIAGNOSIS — Z8262 Family history of osteoporosis: Secondary | ICD-10-CM | POA: Diagnosis not present

## 2019-03-30 DIAGNOSIS — Z880 Allergy status to penicillin: Secondary | ICD-10-CM | POA: Diagnosis not present

## 2019-03-30 DIAGNOSIS — Z79899 Other long term (current) drug therapy: Secondary | ICD-10-CM | POA: Diagnosis not present

## 2019-03-30 DIAGNOSIS — Z96642 Presence of left artificial hip joint: Secondary | ICD-10-CM | POA: Diagnosis present

## 2019-03-30 DIAGNOSIS — I5032 Chronic diastolic (congestive) heart failure: Secondary | ICD-10-CM | POA: Diagnosis present

## 2019-03-30 DIAGNOSIS — M1990 Unspecified osteoarthritis, unspecified site: Secondary | ICD-10-CM | POA: Diagnosis not present

## 2019-03-30 DIAGNOSIS — G4733 Obstructive sleep apnea (adult) (pediatric): Secondary | ICD-10-CM | POA: Diagnosis present

## 2019-03-30 DIAGNOSIS — D509 Iron deficiency anemia, unspecified: Secondary | ICD-10-CM | POA: Diagnosis present

## 2019-03-30 DIAGNOSIS — Z6833 Body mass index (BMI) 33.0-33.9, adult: Secondary | ICD-10-CM | POA: Diagnosis not present

## 2019-03-30 DIAGNOSIS — I10 Essential (primary) hypertension: Secondary | ICD-10-CM | POA: Diagnosis not present

## 2019-03-30 DIAGNOSIS — E785 Hyperlipidemia, unspecified: Secondary | ICD-10-CM | POA: Diagnosis present

## 2019-03-30 HISTORY — PX: ESOPHAGOGASTRODUODENOSCOPY (EGD) WITH PROPOFOL: SHX5813

## 2019-03-30 HISTORY — PX: BIOPSY: SHX5522

## 2019-03-30 LAB — BASIC METABOLIC PANEL
Anion gap: 8 (ref 5–15)
BUN: 18 mg/dL (ref 8–23)
CO2: 23 mmol/L (ref 22–32)
Calcium: 9.6 mg/dL (ref 8.9–10.3)
Chloride: 107 mmol/L (ref 98–111)
Creatinine, Ser: 0.76 mg/dL (ref 0.44–1.00)
GFR calc Af Amer: >60 mL/min (ref >60)
GFR calc non Af Amer: >60 mL/min (ref >60)
Glucose, Bld: 127 mg/dL — ABNORMAL HIGH (ref 70–99)
Potassium: 3.9 mmol/L (ref 3.5–5.1)
Sodium: 138 mmol/L (ref 135–145)

## 2019-03-30 LAB — TYPE AND SCREEN
ABO/RH(D): O POS
Antibody Screen: NEGATIVE
Unit division: 0

## 2019-03-30 LAB — BPAM RBC
Blood Product Expiration Date: 202006202359
ISSUE DATE / TIME: 202005201806
Unit Type and Rh: 5100

## 2019-03-30 LAB — HEMOGLOBIN AND HEMATOCRIT, BLOOD
HCT: 25.5 % — ABNORMAL LOW (ref 36.0–46.0)
Hemoglobin: 8 g/dL — ABNORMAL LOW (ref 12.0–15.0)

## 2019-03-30 LAB — CBC
HCT: 26.6 % — ABNORMAL LOW (ref 36.0–46.0)
Hemoglobin: 7.8 g/dL — ABNORMAL LOW (ref 12.0–15.0)
MCH: 23.2 pg — ABNORMAL LOW (ref 26.0–34.0)
MCHC: 29.3 g/dL — ABNORMAL LOW (ref 30.0–36.0)
MCV: 79.2 fL — ABNORMAL LOW (ref 80.0–100.0)
Platelets: 263 10*3/uL (ref 150–400)
RBC: 3.36 MIL/uL — ABNORMAL LOW (ref 3.87–5.11)
RDW: 19 % — ABNORMAL HIGH (ref 11.5–15.5)
WBC: 7.4 10*3/uL (ref 4.0–10.5)
nRBC: 0 % (ref 0.0–0.2)

## 2019-03-30 LAB — RETICULOCYTES
Immature Retic Fract: 4.3 % (ref 2.3–15.9)
RBC.: 3.35 MIL/uL — ABNORMAL LOW (ref 3.87–5.11)
Retic Count, Absolute: 61.5 10*3/uL (ref 19.0–186.0)
Retic Ct Pct: 1.8 % (ref 0.4–3.1)

## 2019-03-30 LAB — FOLATE: Folate: 31.9 ng/mL (ref >5.9)

## 2019-03-30 LAB — IRON AND TIBC
Iron: 11 ug/dL — ABNORMAL LOW (ref 28–170)
Saturation Ratios: 2 % — ABNORMAL LOW (ref 10.4–31.8)
TIBC: 540 ug/dL — ABNORMAL HIGH (ref 250–450)
UIBC: 529 ug/dL

## 2019-03-30 LAB — FERRITIN: Ferritin: 3 ng/mL — ABNORMAL LOW (ref 11–307)

## 2019-03-30 LAB — VITAMIN B12: Vitamin B-12: 486 pg/mL (ref 180–914)

## 2019-03-30 SURGERY — ESOPHAGOGASTRODUODENOSCOPY (EGD) WITH PROPOFOL
Anesthesia: Monitor Anesthesia Care

## 2019-03-30 MED ORDER — METOCLOPRAMIDE HCL 5 MG/ML IJ SOLN
10.0000 mg | Freq: Once | INTRAMUSCULAR | Status: AC
  Administered 2019-03-30: 10 mg via INTRAVENOUS
  Filled 2019-03-30: qty 2

## 2019-03-30 MED ORDER — PROPOFOL 500 MG/50ML IV EMUL
INTRAVENOUS | Status: DC | PRN
  Administered 2019-03-30: 125 ug/kg/min via INTRAVENOUS

## 2019-03-30 MED ORDER — PROPOFOL 10 MG/ML IV BOLUS
INTRAVENOUS | Status: DC | PRN
  Administered 2019-03-30: 20 mg via INTRAVENOUS

## 2019-03-30 MED ORDER — LIDOCAINE 2% (20 MG/ML) 5 ML SYRINGE
INTRAMUSCULAR | Status: DC | PRN
  Administered 2019-03-30: 40 mg via INTRAVENOUS

## 2019-03-30 MED ORDER — SODIUM CHLORIDE 0.9 % IV SOLN
510.0000 mg | Freq: Once | INTRAVENOUS | Status: AC
  Administered 2019-03-30: 510 mg via INTRAVENOUS
  Filled 2019-03-30: qty 17

## 2019-03-30 MED ORDER — PEG-KCL-NACL-NASULF-NA ASC-C 100 G PO SOLR
1.0000 | Freq: Once | ORAL | Status: AC
  Administered 2019-03-30: 200 g via ORAL
  Filled 2019-03-30: qty 1

## 2019-03-30 MED ORDER — LACTATED RINGERS IV SOLN
INTRAVENOUS | Status: DC
  Administered 2019-03-30: 09:00:00 via INTRAVENOUS

## 2019-03-30 MED ORDER — BISACODYL 5 MG PO TBEC
20.0000 mg | DELAYED_RELEASE_TABLET | Freq: Once | ORAL | Status: AC
  Administered 2019-03-30: 20 mg via ORAL
  Filled 2019-03-30: qty 4

## 2019-03-30 SURGICAL SUPPLY — 15 items

## 2019-03-30 NOTE — Interval H&P Note (Signed)
History and Physical Interval Note:  03/30/2019 8:54 AM  Molly Lucas  has presented today for surgery, with the diagnosis of Anemia, FOBT positive..  The various methods of treatment have been discussed with the patient and family. After consideration of risks, benefits and other options for treatment, the patient has consented to  Procedure(s): ESOPHAGOGASTRODUODENOSCOPY (EGD) WITH PROPOFOL (N/A) as a surgical intervention.  The patient's history has been reviewed, patient examined, no change in status, stable for surgery.  I have reviewed the patient's chart and labs.  Questions were answered to the patient's satisfaction.     Thornton Park

## 2019-03-30 NOTE — Transfer of Care (Signed)
Immediate Anesthesia Transfer of Care Note  Patient: Molly Lucas  Procedure(s) Performed: ESOPHAGOGASTRODUODENOSCOPY (EGD) WITH PROPOFOL (N/A ) BIOPSY  Patient Location: Endoscopy Unit  Anesthesia Type:MAC  Level of Consciousness: awake, alert  and oriented  Airway & Oxygen Therapy: Patient Spontanous Breathing and Patient connected to nasal cannula oxygen  Post-op Assessment: Report given to RN and Post -op Vital signs reviewed and stable  Post vital signs: Reviewed and stable  Last Vitals:  Vitals Value Taken Time  BP 132/78   Temp    Pulse 78   Resp 14   SpO2 100%     Last Pain:  Vitals:   03/30/19 0855  TempSrc: Oral  PainSc: 0-No pain         Complications: No apparent anesthesia complications

## 2019-03-30 NOTE — Op Note (Signed)
[facility] Patient Name: Molly Lucas Procedure Date : 03/30/2019 MRN: [id number] Attending MD: Thornton Park MD, MD Date of Birth: 07-Apr-1946 CSN: [id number] Age: 73 Admit Type: Inpatient Procedure:                Upper GI endoscopy Indications:              Heme positive stool, Gastrointestinal bleeding of                            unknown origin Providers:                Thornton Park MD, MD, Raynelle Bring, RN, Elspeth Cho Tech., Technician, Ladona Ridgel,                            Technician Referring MD:              Medicines:                See the Anesthesia note for documentation of the                            administered medications Complications:            No immediate complications. Estimated blood loss:                            Minimal. Estimated Blood Loss:     Estimated blood loss was minimal. Procedure:                Pre-Anesthesia Assessment:                           - Prior to the procedure, a History and Physical                            was performed, and patient medications and                            allergies were reviewed. The patient's tolerance of                            previous anesthesia was also reviewed. The risks                            and benefits of the procedure and the sedation                            options and risks were discussed with the patient.                            All questions were answered, and informed consent                            was obtained. Prior Anticoagulants: The patient  has                            taken no previous anticoagulant or antiplatelet                            agents. ASA Grade Assessment: III - A patient with                            severe systemic disease. After reviewing the risks                            and benefits, the patient was deemed in                            satisfactory condition to undergo the  procedure.                           After obtaining informed consent, the endoscope was                            passed under direct vision. Throughout the                            procedure, the patient's blood pressure, pulse, and                            oxygen saturations were monitored continuously. The                            GIF-H190 ([id number]) Olympus gastroscope was                            introduced through the mouth, and advanced to the                            third part of duodenum. The upper GI endoscopy was                            accomplished without difficulty. The patient                            tolerated the procedure well. Scope In: Scope Out: Findings:      The examined esophagus was normal.      A large paraesophageal hernia was present. No Cameron's erosions       present. No blood seen in the stomach.      The stomach was normal. No blood seen in the stomach.      The examined duodenum was normal. Biopsies were taken with a cold       forceps for histology. Impression:               - Normal esophagus.                           - Large hiatal  hernia. No Cameron's erosions.                           - Normal stomach.                           - Normal examined duodenum. Biopsied.                           - Source of anemia not identified on this                            examination. Recommendation:           - Await pathology results.                           - I did not make any changes in her medications                            today.                           - Clear liquid diet today. Bowel prep tonight in                            preparation for colonoscopy tomorrow. Procedure Code(s):        --- Professional ---                           [phone], Esophagogastroduodenoscopy, flexible,                            transoral; with biopsy, single or multiple Diagnosis Code(s):        --- Professional ---                            K44.9, Diaphragmatic hernia without obstruction or                            gangrene                           R19.5, Other fecal abnormalities                           K92.2, Gastrointestinal hemorrhage, unspecified CPT copyright 2019 American Medical Association. All rights reserved. The codes documented in this report are preliminary and upon coder review may  be revised to meet current compliance requirements. Thornton Park MD, MD 03/30/2019 10:02:51 AM This report has been signed electronically. Number of Addenda: 0

## 2019-03-30 NOTE — Anesthesia Postprocedure Evaluation (Signed)
Anesthesia Post Note  Patient: Molly Lucas  Procedure(s) Performed: ESOPHAGOGASTRODUODENOSCOPY (EGD) WITH PROPOFOL (N/A ) BIOPSY     Patient location during evaluation: Endoscopy Anesthesia Type: MAC Level of consciousness: awake and alert Pain management: pain level controlled Vital Signs Assessment: post-procedure vital signs reviewed and stable Respiratory status: spontaneous breathing, nonlabored ventilation, respiratory function stable and patient connected to nasal cannula oxygen Cardiovascular status: stable and blood pressure returned to baseline Postop Assessment: no apparent nausea or vomiting Anesthetic complications: no    Last Vitals:  Vitals:   03/30/19 1009 03/30/19 1327  BP: (!) 105/47 116/68  Pulse: 71 77  Resp: 15   Temp:  (!) 36.4 C  SpO2: 100% 99%    Last Pain:  Vitals:   03/30/19 1327  TempSrc: Oral  PainSc:                  Hilary Milks COKER

## 2019-03-30 NOTE — Progress Notes (Signed)
[facility] PROGRESS NOTE  Molly Lucas FBP:[id number] DOB: 08-24-1946 DOA: 03/29/2019 PCP: [name], MD   Narrative: 73 year old white ?, resident of [facility] Known sleep apnea follows with Dr. Brett Fairy Prior upper GI bleed 11/30/2015-GE junction ulcer-transfused 4 units at that time Chronic compensated diastolic heart failure Prior total hip 06/28/2012 Prior major depressive disorder   Prior total hip 06/28/2012 admitted to [facility] 5/20 after having routine preop lab work done-hemoglobin found to be 6.6, + dizzy, + abdominal discomfort, + intermittent dark stools-- vomiting of blood  A & Plan Probable lower GI bleed Unlikely to be upper given endoscopy 5/21-further work-up deferred to GI-?  Colonoscopy in a.m.?  Capsule Follow biopsies done 5/21 Chronic iron def exacerbated by GIB  Had Low Iron level ~ 3 yrs ago--had infusions 2018, 2019  Iron levels are low now--would give IV iron today Sleep apnea Chronic compensated HFpEF Doesn't use CPAP now--dental mouthpiece She is clinically compensated at that time Prior depression Clinically is stable-resume doxepin 6 mg at bedtime, Cymbalta 60 at bedtime Hyperlipidemia Continue Pravachol 20 at bedtime Osteoporosis Moderate obesity BMI 33 Stable clinical problems at this time  SCD, inpatient, needs another midnight to rule out lower GI bleed and further work-up Discussed with patient alone   Verlon Au, MD  [facility] Via [employer] app OR [email or web address] 7PM-7AM contact night coverage as above 03/30/2019, 10:40 AM  LOS: 0 days   Consultants:  Gastroenterology  Procedures: Upper endoscopy 5/21 Impression:               - Normal esophagus.                           - Large hiatal hernia. No Cameron's erosions.                           - Normal stomach.                           - Normal examined duodenum. Biopsied.                           - Source of anemia not identified on this                  examination. Recommendation:           - Await pathology results.                           - I did not make any changes in her medications                            today.                           - Clear liquid diet today. Bowel prep tonight in                            preparation for colonoscopy tomorrow.  Antimicrobials:  None  Interval history/Subjective: Awake alert pleasant no distress just back from colonoscopy no fever no chills dizziness is less  Objective:  Vitals:  Vitals:   03/30/19 0959 03/30/19 1009  BP: (!) 113/50 Marland Kitchen)  105/47  Pulse: 81 71  Resp: 18 15  Temp: 97.8 F (36.6 C)   SpO2: 100% 100%    Exam:  EOMI NCAT no distress no pallor thick neck Mallampati 4 Chest clinically clear no added sound Abdomen soft no rebound no guarding no epigastric tenderness No lower extremity edema Range of motion is intact Skin soft supple Patient has the Z-lining of fingers    I have personally reviewed the following:  DATA   Labs:  Iron 11 sat ratio was 2  Hemoglobin up from 6.3 up to 8.0   Scheduled Meds: . Doxepin HCl  1 tablet Oral QHS  . DULoxetine  60 mg Oral QHS  . famotidine  20 mg Oral BID  . pravastatin  20 mg Oral QHS  . sodium chloride flush  3 mL Intravenous Q12H   Continuous Infusions: . sodium chloride      Principal Problem:   Symptomatic anemia Active Problems:   HLD (hyperlipidemia)   History of gastric ulcer   Insomnia   GI bleed   LOS: 0 days

## 2019-03-30 NOTE — Anesthesia Preprocedure Evaluation (Signed)
Anesthesia Evaluation  Patient identified by MRN, date of birth, ID band Patient awake    Reviewed: Allergy & Precautions, NPO status , Patient's Chart, lab work & pertinent test results  Airway Mallampati: II  TM Distance: >3 FB Neck ROM: Full    Dental no notable dental hx.    Pulmonary neg pulmonary ROS,    Pulmonary exam normal breath sounds clear to auscultation       Cardiovascular negative cardio ROS Normal cardiovascular exam Rhythm:Regular Rate:Normal     Neuro/Psych negative neurological ROS  negative psych ROS   GI/Hepatic Neg liver ROS, GERD  ,  Endo/Other  negative endocrine ROS  Renal/GU negative Renal ROS  negative genitourinary   Musculoskeletal negative musculoskeletal ROS (+)   Abdominal   Peds negative pediatric ROS (+)  Hematology  (+) anemia ,   Anesthesia Other Findings   Reproductive/Obstetrics negative OB ROS                             Anesthesia Physical Anesthesia Plan  ASA: II  Anesthesia Plan: MAC   Post-op Pain Management:    Induction: Intravenous  PONV Risk Score and Plan: 0  Airway Management Planned: Simple Face Mask  Additional Equipment:   Intra-op Plan:   Post-operative Plan:   Informed Consent: I have reviewed the patients History and Physical, chart, labs and discussed the procedure including the risks, benefits and alternatives for the proposed anesthesia with the patient or authorized representative who has indicated his/her understanding and acceptance.     Dental advisory given  Plan Discussed with: CRNA and Surgeon  Anesthesia Plan Comments:         Anesthesia Quick Evaluation

## 2019-03-30 NOTE — Anesthesia Preprocedure Evaluation (Addendum)
Anesthesia Evaluation  Patient identified by MRN, date of birth, ID band Patient awake    Reviewed: Allergy & Precautions, NPO status , Patient's Chart, lab work & pertinent test results  History of Anesthesia Complications Negative for: history of anesthetic complications  Airway Mallampati: I  TM Distance: >3 FB Neck ROM: Full    Dental  (+) Caps, Dental Advisory Given, Teeth Intact   Pulmonary shortness of breath,    breath sounds clear to auscultation       Cardiovascular hypertension,  Rhythm:Regular Rate:Normal  '17 ECHO: EF 55-60%, mild-mod MR, mild TR '17 Stress: EF 65%, mod fixed inferior defect consistent with diaphragmatic attenuation, no ischemia   Neuro/Psych Anxiety Depression negative neurological ROS     GI/Hepatic Neg liver ROS, PUD, GERD  Medicated and Controlled,  Endo/Other  obese  Renal/GU negative Renal ROS     Musculoskeletal  (+) Arthritis ,   Abdominal (+) + obese,   Peds  Hematology  (+) Blood dyscrasia (Hb 7.9), anemia ,   Anesthesia Other Findings   Reproductive/Obstetrics                            Anesthesia Physical Anesthesia Plan  ASA: II  Anesthesia Plan: MAC   Post-op Pain Management:    Induction:   PONV Risk Score and Plan: 2 and Treatment may vary due to age or medical condition  Airway Management Planned: Natural Airway and Simple Face Mask  Additional Equipment:   Intra-op Plan:   Post-operative Plan:   Informed Consent: I have reviewed the patients History and Physical, chart, labs and discussed the procedure including the risks, benefits and alternatives for the proposed anesthesia with the patient or authorized representative who has indicated his/her understanding and acceptance.     Dental advisory given  Plan Discussed with: CRNA and Surgeon  Anesthesia Plan Comments:        Anesthesia Quick Evaluation

## 2019-03-31 ENCOUNTER — Ambulatory Visit (HOSPITAL_BASED_OUTPATIENT_CLINIC_OR_DEPARTMENT_OTHER): Admit: 2019-03-31 | Payer: Medicare Other | Admitting: Plastic Surgery

## 2019-03-31 ENCOUNTER — Encounter (HOSPITAL_COMMUNITY): Payer: Self-pay | Admitting: *Deleted

## 2019-03-31 ENCOUNTER — Encounter: Payer: Self-pay | Admitting: Gastroenterology

## 2019-03-31 ENCOUNTER — Inpatient Hospital Stay (HOSPITAL_COMMUNITY): Payer: Medicare Other | Admitting: Anesthesiology

## 2019-03-31 ENCOUNTER — Encounter (HOSPITAL_BASED_OUTPATIENT_CLINIC_OR_DEPARTMENT_OTHER): Payer: Self-pay

## 2019-03-31 ENCOUNTER — Ambulatory Visit (HOSPITAL_BASED_OUTPATIENT_CLINIC_OR_DEPARTMENT_OTHER): Admission: RE | Admit: 2019-03-31 | Payer: Medicare Other | Source: Home / Self Care | Admitting: Plastic Surgery

## 2019-03-31 ENCOUNTER — Encounter (HOSPITAL_COMMUNITY)
Admission: EM | Disposition: A | Discharge status: Home / Self Care | Payer: Self-pay | Source: Home / Self Care | Attending: Family Medicine

## 2019-03-31 DIAGNOSIS — R195 Other fecal abnormalities: Secondary | ICD-10-CM

## 2019-03-31 HISTORY — PX: COLONOSCOPY WITH PROPOFOL: SHX5780

## 2019-03-31 HISTORY — DX: Gastro-esophageal reflux disease without esophagitis: K21.9

## 2019-03-31 LAB — CBC WITH DIFFERENTIAL/PLATELET
Abs Immature Granulocytes: 0.02 10*3/uL (ref 0.00–0.07)
Basophils Absolute: 0 10*3/uL (ref 0.0–0.1)
Basophils Relative: 1 %
Eosinophils Absolute: 0.2 10*3/uL (ref 0.0–0.5)
Eosinophils Relative: 4 %
HCT: 27.3 % — ABNORMAL LOW (ref 36.0–46.0)
Hemoglobin: 7.9 g/dL — ABNORMAL LOW (ref 12.0–15.0)
Immature Granulocytes: 0 %
Lymphocytes Relative: 23 %
Lymphs Abs: 1.2 10*3/uL (ref 0.7–4.0)
MCH: 22.8 pg — ABNORMAL LOW (ref 26.0–34.0)
MCHC: 28.9 g/dL — ABNORMAL LOW (ref 30.0–36.0)
MCV: 78.7 fL — ABNORMAL LOW (ref 80.0–100.0)
Monocytes Absolute: 0.6 10*3/uL (ref 0.1–1.0)
Monocytes Relative: 12 %
Neutro Abs: 3.2 10*3/uL (ref 1.7–7.7)
Neutrophils Relative %: 60 %
Platelets: 277 10*3/uL (ref 150–400)
RBC: 3.47 MIL/uL — ABNORMAL LOW (ref 3.87–5.11)
RDW: 19.5 % — ABNORMAL HIGH (ref 11.5–15.5)
WBC: 5.3 10*3/uL (ref 4.0–10.5)
nRBC: 0 % (ref 0.0–0.2)

## 2019-03-31 LAB — BASIC METABOLIC PANEL
Anion gap: 9 (ref 5–15)
BUN: 9 mg/dL (ref 8–23)
CO2: 22 mmol/L (ref 22–32)
Calcium: 9.8 mg/dL (ref 8.9–10.3)
Chloride: 110 mmol/L (ref 98–111)
Creatinine, Ser: 0.75 mg/dL (ref 0.44–1.00)
GFR calc Af Amer: >60 mL/min (ref >60)
GFR calc non Af Amer: >60 mL/min (ref >60)
Glucose, Bld: 99 mg/dL (ref 70–99)
Potassium: 3.8 mmol/L (ref 3.5–5.1)
Sodium: 141 mmol/L (ref 135–145)

## 2019-03-31 SURGERY — MAMMOPLASTY, REDUCTION
Anesthesia: General | Site: Breast | Laterality: Bilateral

## 2019-03-31 SURGERY — COLONOSCOPY WITH PROPOFOL
Anesthesia: Monitor Anesthesia Care

## 2019-03-31 MED ORDER — PROPOFOL 10 MG/ML IV BOLUS
INTRAVENOUS | Status: DC | PRN
  Administered 2019-03-31: 20 mg via INTRAVENOUS
  Administered 2019-03-31: 30 mg via INTRAVENOUS
  Administered 2019-03-31 (×2): 20 mg via INTRAVENOUS

## 2019-03-31 MED ORDER — PHENYLEPHRINE 40 MCG/ML (10ML) SYRINGE FOR IV PUSH (FOR BLOOD PRESSURE SUPPORT)
PREFILLED_SYRINGE | INTRAVENOUS | Status: DC | PRN
  Administered 2019-03-31: 120 ug via INTRAVENOUS

## 2019-03-31 MED ORDER — FAMOTIDINE 20 MG PO TABS: 20.0000 mg | ORAL_TABLET | Freq: Two times a day (BID) | ORAL | Status: DC

## 2019-03-31 MED ORDER — PANTOPRAZOLE SODIUM 40 MG PO TBEC: 40.0000 mg | DELAYED_RELEASE_TABLET | Freq: Two times a day (BID) | ORAL | 1 refills | Status: DC

## 2019-03-31 MED ORDER — PROPOFOL 500 MG/50ML IV EMUL
INTRAVENOUS | Status: DC | PRN
  Administered 2019-03-31: 100 ug/kg/min via INTRAVENOUS

## 2019-03-31 MED ORDER — LACTATED RINGERS IV SOLN
INTRAVENOUS | Status: DC
  Administered 2019-03-31: 07:00:00 via INTRAVENOUS

## 2019-03-31 SURGICAL SUPPLY — 21 items

## 2019-03-31 NOTE — Transfer of Care (Signed)
Immediate Anesthesia Transfer of Care Note  Patient: Molly Lucas  Procedure(s) Performed: COLONOSCOPY WITH PROPOFOL (N/A )  Patient Location: Endoscopy Unit  Anesthesia Type:MAC  Level of Consciousness: awake, alert  and oriented  Airway & Oxygen Therapy: Patient Spontanous Breathing and Patient connected to face mask oxygen  Post-op Assessment: Report given to RN and Post -op Vital signs reviewed and stable  Post vital signs: Reviewed and stable  Last Vitals:  Vitals Value Taken Time  BP    Temp    Pulse 80 03/31/2019  8:33 AM  Resp 25 03/31/2019  8:33 AM  SpO2 98 % 03/31/2019  8:33 AM  Vitals shown include unvalidated device data.  Last Pain:  Vitals:   03/31/19 0801  TempSrc:   PainSc: 0-No pain         Complications: No apparent anesthesia complications

## 2019-03-31 NOTE — Op Note (Addendum)
[facility] Patient Name: Molly Lucas Procedure Date : 03/31/2019 MRN: [id number] Attending MD: Thornton Park MD, MD Date of Birth: [birth year] CSN: [id number] Age: 73 Admit Type: Inpatient Procedure:                Colonoscopy Indications:              Heme positive stool. Hemoglobin 6.6. Longstanding                            iron deficiency anemia. Last colonoscopy 2015. EGD                            03/30/19 revealed no source for anemia and heme +                            stools. Providers:                Thornton Park MD, MD, Jobe Igo, RN,                            Ladona Ridgel, Technician, Charolette Child,                            Technician Referring MD:              Medicines:                See the Anesthesia note for documentation of the                            administered medications Complications:            No immediate complications. Estimated Blood Loss:     Estimated blood loss: none. Procedure:                Pre-Anesthesia Assessment:                           - Prior to the procedure, a History and Physical                            was performed, and patient medications and                            allergies were reviewed. The patient's tolerance of                            previous anesthesia was also reviewed. The risks                            and benefits of the procedure and the sedation                            options and risks were discussed with the patient.                            All questions were answered, and informed  consent                            was obtained. Prior Anticoagulants: The patient has                            taken no previous anticoagulant or antiplatelet                            agents. ASA Grade Assessment: III - A patient with                            severe systemic disease. After reviewing the risks                            and benefits, the patient was deemed in                          satisfactory condition to undergo the procedure.                           After obtaining informed consent, the colonoscope                            was passed under direct vision. Throughout the                            procedure, the patient's blood pressure, pulse, and                            oxygen saturations were monitored continuously. The                            CF-HQ190L ([id number] Olympus colonoscope was                            introduced through the anus and advanced to the the                            terminal ileum, with identification of the                            appendiceal orifice and IC valve. The colonoscopy                            was performed without difficulty. The patient                            tolerated the procedure well. The quality of the                            bowel preparation was good. The terminal ileum,  ileocecal valve, appendiceal orifice, and rectum                            were photographed. Scope In: 8:09:49 AM Scope Out: 8:27:49 AM Scope Withdrawal Time: 0 hours 13 minutes 33 seconds  Total Procedure Duration: 0 hours 18 minutes 0 seconds  Findings:      The perianal and digital rectal examinations were normal.      The entire examined colon and distal terminal ileum appeared normal on       direct and retroflexion views. No polyps, mass, or diverticulosis. Impression:               - The entire examined colon is normal on direct and                            retroflexion views.                           - Source of anemia not identified.                           - No specimens collected. Recommendation:           - Return to the hospital ward for further                            monitoring.                           - Resume regular diet.                           - Follow-up with Dr. Allyn Kenner, her primary                            gastroenterologist, for an  outpatient capsule                            endoscopy.                           - No further inpatient GI evaluation planned at                            this time. Please call the on-call                            gastroenterologist with any additional questions or                            concerns during this hospitalization. Procedure Code(s):        --- Professional ---                           [phone], Colonoscopy, flexible; diagnostic, including                            collection of specimen(s) by  brushing or washing,                            when performed (separate procedure) Diagnosis Code(s):        --- Professional ---                           R19.5, Other fecal abnormalities CPT copyright 2019 American Medical Association. All rights reserved. The codes documented in this report are preliminary and upon coder review may  be revised to meet current compliance requirements. Thornton Park MD, MD 03/31/2019 8:40:15 AM This report has been signed electronically. Number of Addenda: 0

## 2019-03-31 NOTE — Interval H&P Note (Signed)
History and Physical Interval Note:  03/31/2019 7:57 AM  Molly Lucas  has presented today for surgery, with the diagnosis of Anemia.  FOBT positive..  The various methods of treatment have been discussed with the patient and family. After consideration of risks, benefits and other options for treatment, the patient has consented to  Procedure(s): COLONOSCOPY WITH PROPOFOL (N/A) as a surgical intervention.  The patient's history has been reviewed, patient examined, no change in status, stable for surgery.  I have reviewed the patient's chart and labs.  Questions were answered to the patient's satisfaction.     Thornton Park

## 2019-03-31 NOTE — Discharge Summary (Signed)
Physician Discharge Summary  Molly Lucas ZOX:[id number] DOB: 04-22-1946 DOA: 03/29/2019  PCP: [name], MD  Admit date: 03/29/2019 Discharge date: 03/31/2019  Time spent: 40 minutes  Recommendations for Outpatient Follow-up:  1. Follow UGI biopsy from 5/21 2. Needs IV irojn arranged as OP 3. recommend cbc and bmet ~ 1 week   Discharge Diagnoses:  Principal Problem:   Symptomatic anemia Active Problems:   HLD (hyperlipidemia)   History of gastric ulcer   Insomnia   GI bleed   Discharge Condition: improved  Diet recommendation: soft and then reg after 48 hr  Filed Weights   03/30/19 0855  Weight: 79.4 kg    History of present illness:  73 year old white ?, resident of [facility] Known sleep apnea follows with Dr. Brett Fairy Prior upper GI bleed 11/30/2015-GE junction ulcer-transfused 4 units at that time Chronic compensated diastolic heart failure Prior total hip 06/28/2012 Prior major depressive disorder   Prior total hip 06/28/2012 admitted to [facility] 5/20 after having routine preop lab work done-hemoglobin found to be 6.6, + dizzy, + abdominal discomfort, + intermittent dark stools-- vomiting of blood  Hospital Course:  Probable lower GI bleed  upper given endoscopy 5/21 was neg for bleed Colonoscopy 5/22 neg GI discussed with patient nad will get Capsule as OP Dr. Allyn Kenner in ~ 1 week Cont Pepcid-sending patient home on protonix 40 bid Follow biopsies done 5/21 as OP Chronic iron def exacerbated by GIB             Had Low Iron level ~ 3 yrs ago--had infusions 2018, 2019             Iron levels are low now--received IV iron 5/21 Sleep apnea Chronic compensated HFpEF Doesn't use CPAP now--dental mouthpiece She is clinically compensated at that time Prior depression Clinically is stable-resume doxepin 6 mg at bedtime, Cymbalta 60 at bedtime Hyperlipidemia Continue Pravachol 20 at bedtime Osteoporosis Moderate obesity BMI 33 Stable  clinical problems at this time  Consultants:  Gastroenterology  Procedures: Upper endoscopy 5/21 Impression: - Normal esophagus. - Large hiatal hernia. No Cameron's erosions. - Normal stomach. - Normal examined duodenum. Biopsied. - Source of anemia not identified on this  examination. Recommendation: - Await pathology results. - I did not make any changes in her medications  today. - Clear liquid diet today. Bowel prep tonight in  preparation for colonoscopy tomorrow.  COLONOSCOPY 5/22 Impression:               - The entire examined colon is normal on direct and                            retroflexion views.                           - Source of anemia not identified.                           - No specimens collected. Recommendation:           - Return to the hospital ward for further                            monitoring.                           -  Resume regular diet.                           - Follow-up with Dr. Allyn Kenner, her primary                            gastroenterologist, for an outpatient capsule                            endoscopy.                           - No further inpatient GI evaluation planned at                            this time. Please call the on-call                            gastroenterologist with any additional questions or                            concerns during this hospitalization.  Discharge Exam: Vitals:   03/31/19 0845 03/31/19 0846  BP: 90/67 111/66  Pulse: 78 77  Resp: 18 14  Temp:    SpO2: 95% 97%    General: awake alert pleasant in nad, no distress no pallor no ict Cardiovascular: s1 s2 no m/r/g Respiratory: clear no added sound abd soft nt nd no rebound no  guard No le edema Neuro intact euthymic, congruent  Discharge Instructions   Discharge Instructions    Diet - low sodium heart healthy   Complete by:  As directed    Discharge instructions   Complete by:  As directed    Please continue your meds without change except your pepcid which has been changed to 2 x a day In addition have added short term acid reducer protonix for 1 month and Dr. Allyn Kenner can guide you as to when to stop this Please ensure that you follow up with primary MD regarding Iron infusions as we discussed Would recommend blood work in about 1 week Start activity slowly and carefully and take your time with your meals for the next 24-48 hours All the best and have a happy summer   Increase activity slowly   Complete by:  As directed      Allergies as of 03/31/2019      Reactions   Penicillins Swelling, Other (See Comments)   Tongue became swollen and turned black Did it involve swelling of the face/tongue/throat, SOB, or low BP? Yes Did it involve sudden or severe rash/hives, skin peeling, or any reaction on the inside of your mouth or nose? Yes Did you need to seek medical attention at a hospital or doctor's office? Yes When did it last happen? "I was a child" If all above answers are "NO", may proceed with cephalosporin use.      Medication List    TAKE these medications   CALCIUM PO Take 2 tablets by mouth daily.   Cinnamon 500 MG capsule Take 500 mg by mouth daily.   DULoxetine 30 MG capsule Commonly known as:  CYMBALTA Take 60 mg by mouth at bedtime.   famotidine 20 MG tablet Commonly known as:  PEPCID Take  1 tablet (20 mg total) by mouth 2 (two) times daily. What changed:  when to take this   Garlic 4268 MG Caps Take 2,000 mg by mouth daily.   Hair/Skin/Nails Tabs Take 1 tablet by mouth daily.   multivitamin-lutein Caps capsule Take 1 capsule by mouth daily.   NON FORMULARY Take 1 capsule by mouth See admin instructions. Black Cumiun  oil capsules: Take 1 capsule by mouth once a day   Omega-3 1000 MG Caps Take 2,000 mg by mouth daily.   pantoprazole 40 MG tablet Commonly known as:  Protonix Take 1 tablet (40 mg total) by mouth 2 (two) times daily for 30 days.   pravastatin 20 MG tablet Commonly known as:  PRAVACHOL Take 20 mg by mouth at bedtime.   PROBIOTIC DAILY PO Take 1 capsule by mouth daily.   Silenor 6 MG Tabs Generic drug:  Doxepin HCl Take 6 mg by mouth at bedtime.   Turmeric 500 MG Caps Take 500 mg by mouth daily.   Vicks VapoRub 3.4-1.9-6.2 % Oint 1 application See admin instructions. Apply under the nostrils at bedtime   vitamin C 500 MG tablet Commonly known as:  ASCORBIC ACID Take 500 mg by mouth daily.   Vitamin D3 250 MCG (10000 UT) Tabs Take 10,000 Units by mouth daily.      Allergies  Allergen Reactions  . Penicillins Swelling and Other (See Comments)    Tongue became swollen and turned black Did it involve swelling of the face/tongue/throat, SOB, or low BP? Yes Did it involve sudden or severe rash/hives, skin peeling, or any reaction on the inside of your mouth or nose? Yes Did you need to seek medical attention at a hospital or doctor's office? Yes When did it last happen? "I was a child" If all above answers are "NO", may proceed with cephalosporin use.       The results of significant diagnostics from this hospitalization (including imaging, microbiology, ancillary and laboratory) are listed below for reference.    Significant Diagnostic Studies: No results found.  Microbiology: Recent Results (from the past 240 hour(s))  Novel Coronavirus, NAA (hospital order; send-out to ref lab)     Status: None   Collection Time: 03/28/19  1:51 PM  Result Value Ref Range Status   SARS-CoV-2, NAA NOT DETECTED NOT DETECTED Final    Comment: (NOTE) Testing was performed using the cobas(R) SARS-CoV-2 test. This test was developed and its performance characteristics determined  by [employer]. This test has not been FDA cleared or approved. This test has been authorized by FDA under an Emergency Use Authorization (EUA). This test is only authorized for the duration of time the declaration that circumstances exist justifying the authorization of the emergency use of in vitro diagnostic tests for detection of SARS-CoV-2 virus and/or diagnosis of COVID-19 infection under section 564(b)(1) of the Act, 21 U.S.C. 229NLG-9(Q)(1), unless the authorization is terminated or revoked sooner. When diagnostic testing is negative, the possibility of a false negative result should be considered in the context of a patient's recent exposures and the presence of clinical signs and symptoms consistent with COVID-19. An individual without symptoms of COVID-19 and who is not shedding SARS-CoV-2 virus would expect to have  a negative (not detected) result in this assay. Performed At: [facility] [address] [place], [place] [id number] Rush Farmer MD KG:[id number]    [place]  Final    Comment: Performed at [facility], [place],  [place] [address]     Labs: Basic Metabolic Panel: Recent Labs  Lab 03/28/19 1523 03/29/19 1241 03/30/19 0013 03/31/19 0349  NA 136 139 138 141  K 4.4 4.2 3.9 3.8  CL 104 108 107 110  CO2 24 24 23 22   GLUCOSE 108* 102* 127* 99  BUN 19 19 18 9   CREATININE 0.96 0.83 0.76 0.75  CALCIUM 9.7 9.5 9.6 9.8   Liver Function Tests: Recent Labs  Lab 03/29/19 1241  AST 16  ALT 19  ALKPHOS 70  BILITOT 1.0  PROT 6.1*  ALBUMIN 3.5   No results for input(s): LIPASE, AMYLASE in the last 168 hours. No results for input(s): AMMONIA in the last 168 hours. CBC: Recent Labs  Lab 03/28/19 1523 03/29/19 1241 03/30/19 0013 03/31/19 0349  WBC 8.1 6.6 7.4 5.3  NEUTROABS 5.2 4.6  --  3.2  HGB 6.6* 6.3* 8.0*  7.8* 7.9*  HCT 23.6* 22.8* 25.5*  26.6* 27.3*  MCV 77.4* 78.1* 79.2*  78.7*  PLT 311 279 263 277   Cardiac Enzymes: No results for input(s): CKTOTAL, CKMB, CKMBINDEX, TROPONINI in the last 168 hours. BNP: BNP (last 3 results) No results for input(s): BNP in the last 8760 hours.  ProBNP (last 3 results) No results for input(s): PROBNP in the last 8760 hours.  CBG: No results for input(s): GLUCAP in the last 168 hours.     Signed:  Nita Sells MD   [facility] 03/31/2019, 1:54 PM

## 2019-03-31 NOTE — Anesthesia Postprocedure Evaluation (Signed)
Anesthesia Post Note  Patient: RENU ASBY  Procedure(s) Performed: COLONOSCOPY WITH PROPOFOL (N/A )     Patient location during evaluation: Endoscopy Anesthesia Type: MAC Level of consciousness: awake and alert, patient cooperative and oriented Pain management: pain level controlled Vital Signs Assessment: post-procedure vital signs reviewed and stable Respiratory status: spontaneous breathing, nonlabored ventilation and respiratory function stable Cardiovascular status: blood pressure returned to baseline and stable Postop Assessment: no apparent nausea or vomiting Anesthetic complications: no    Last Vitals:  Vitals:   03/31/19 0845 03/31/19 0846  BP: 90/67 111/66  Pulse: 78 77  Resp: 18 14  Temp:    SpO2: 95% 97%    Last Pain:  Vitals:   03/31/19 0845  TempSrc:   PainSc: 2                  Shakai Dolley,E. Lynetta Tomczak

## 2019-04-03 ENCOUNTER — Encounter (HOSPITAL_COMMUNITY): Payer: Self-pay | Admitting: Gastroenterology

## 2019-04-06 DIAGNOSIS — R195 Other fecal abnormalities: Secondary | ICD-10-CM | POA: Diagnosis not present

## 2019-04-06 DIAGNOSIS — D509 Iron deficiency anemia, unspecified: Secondary | ICD-10-CM | POA: Diagnosis not present

## 2019-04-07 DIAGNOSIS — F411 Generalized anxiety disorder: Secondary | ICD-10-CM | POA: Diagnosis not present

## 2019-04-10 DIAGNOSIS — R1310 Dysphagia, unspecified: Secondary | ICD-10-CM | POA: Diagnosis not present

## 2019-04-10 DIAGNOSIS — D509 Iron deficiency anemia, unspecified: Secondary | ICD-10-CM | POA: Diagnosis not present

## 2019-04-10 DIAGNOSIS — K6389 Other specified diseases of intestine: Secondary | ICD-10-CM | POA: Diagnosis not present

## 2019-04-12 DIAGNOSIS — H43811 Vitreous degeneration, right eye: Secondary | ICD-10-CM | POA: Diagnosis not present

## 2019-04-12 DIAGNOSIS — H16101 Unspecified superficial keratitis, right eye: Secondary | ICD-10-CM | POA: Diagnosis not present

## 2019-04-12 DIAGNOSIS — Z961 Presence of intraocular lens: Secondary | ICD-10-CM | POA: Diagnosis not present

## 2019-04-12 DIAGNOSIS — H04123 Dry eye syndrome of bilateral lacrimal glands: Secondary | ICD-10-CM | POA: Diagnosis not present

## 2019-04-14 DIAGNOSIS — F411 Generalized anxiety disorder: Secondary | ICD-10-CM | POA: Diagnosis not present

## 2019-04-20 DIAGNOSIS — F411 Generalized anxiety disorder: Secondary | ICD-10-CM | POA: Diagnosis not present

## 2019-04-21 DIAGNOSIS — I10 Essential (primary) hypertension: Secondary | ICD-10-CM | POA: Diagnosis not present

## 2019-04-21 DIAGNOSIS — D649 Anemia, unspecified: Secondary | ICD-10-CM | POA: Diagnosis not present

## 2019-04-21 DIAGNOSIS — M25511 Pain in right shoulder: Secondary | ICD-10-CM | POA: Diagnosis not present

## 2019-04-21 DIAGNOSIS — Z1331 Encounter for screening for depression: Secondary | ICD-10-CM | POA: Diagnosis not present

## 2019-04-21 DIAGNOSIS — G4733 Obstructive sleep apnea (adult) (pediatric): Secondary | ICD-10-CM | POA: Diagnosis not present

## 2019-05-08 DIAGNOSIS — F411 Generalized anxiety disorder: Secondary | ICD-10-CM | POA: Diagnosis not present

## 2019-05-18 ENCOUNTER — Ambulatory Visit
Admission: RE | Admit: 2019-05-18 | Discharge: 2019-05-18 | Disposition: A | Discharge status: Home / Self Care | Payer: Medicare Other | Source: Ambulatory Visit | Attending: Obstetrics & Gynecology | Admitting: Obstetrics & Gynecology

## 2019-05-18 ENCOUNTER — Other Ambulatory Visit: Payer: Self-pay

## 2019-05-18 DIAGNOSIS — M810 Age-related osteoporosis without current pathological fracture: Secondary | ICD-10-CM | POA: Diagnosis not present

## 2019-05-18 DIAGNOSIS — E2839 Other primary ovarian failure: Secondary | ICD-10-CM

## 2019-05-18 DIAGNOSIS — Z780 Asymptomatic menopausal state: Secondary | ICD-10-CM | POA: Diagnosis not present

## 2019-05-18 DIAGNOSIS — M85851 Other specified disorders of bone density and structure, right thigh: Secondary | ICD-10-CM | POA: Diagnosis not present

## 2019-05-19 ENCOUNTER — Encounter (HOSPITAL_COMMUNITY): Payer: Self-pay

## 2019-05-19 ENCOUNTER — Observation Stay (HOSPITAL_COMMUNITY)
Admission: EM | Admit: 2019-05-19 | Discharge: 2019-05-20 | Disposition: A | Discharge status: Home / Self Care | Payer: Medicare Other | Attending: Family Medicine | Admitting: Family Medicine

## 2019-05-19 ENCOUNTER — Other Ambulatory Visit: Payer: Self-pay

## 2019-05-19 DIAGNOSIS — Z79899 Other long term (current) drug therapy: Secondary | ICD-10-CM | POA: Insufficient documentation

## 2019-05-19 DIAGNOSIS — Z20828 Contact with and (suspected) exposure to other viral communicable diseases: Secondary | ICD-10-CM | POA: Diagnosis not present

## 2019-05-19 DIAGNOSIS — Q2739 Arteriovenous malformation, other site: Secondary | ICD-10-CM | POA: Insufficient documentation

## 2019-05-19 DIAGNOSIS — D509 Iron deficiency anemia, unspecified: Principal | ICD-10-CM | POA: Diagnosis present

## 2019-05-19 DIAGNOSIS — F329 Major depressive disorder, single episode, unspecified: Secondary | ICD-10-CM | POA: Diagnosis not present

## 2019-05-19 DIAGNOSIS — Z96642 Presence of left artificial hip joint: Secondary | ICD-10-CM | POA: Diagnosis not present

## 2019-05-19 DIAGNOSIS — E785 Hyperlipidemia, unspecified: Secondary | ICD-10-CM | POA: Diagnosis not present

## 2019-05-19 DIAGNOSIS — Z880 Allergy status to penicillin: Secondary | ICD-10-CM | POA: Insufficient documentation

## 2019-05-19 DIAGNOSIS — K219 Gastro-esophageal reflux disease without esophagitis: Secondary | ICD-10-CM | POA: Diagnosis present

## 2019-05-19 DIAGNOSIS — Z1159 Encounter for screening for other viral diseases: Secondary | ICD-10-CM | POA: Diagnosis not present

## 2019-05-19 DIAGNOSIS — F419 Anxiety disorder, unspecified: Secondary | ICD-10-CM | POA: Insufficient documentation

## 2019-05-19 DIAGNOSIS — Z8249 Family history of ischemic heart disease and other diseases of the circulatory system: Secondary | ICD-10-CM | POA: Insufficient documentation

## 2019-05-19 DIAGNOSIS — I10 Essential (primary) hypertension: Secondary | ICD-10-CM | POA: Insufficient documentation

## 2019-05-19 DIAGNOSIS — D500 Iron deficiency anemia secondary to blood loss (chronic): Secondary | ICD-10-CM | POA: Diagnosis not present

## 2019-05-19 DIAGNOSIS — F32A Depression, unspecified: Secondary | ICD-10-CM | POA: Diagnosis present

## 2019-05-19 DIAGNOSIS — G4733 Obstructive sleep apnea (adult) (pediatric): Secondary | ICD-10-CM | POA: Diagnosis not present

## 2019-05-19 DIAGNOSIS — D649 Anemia, unspecified: Secondary | ICD-10-CM | POA: Diagnosis not present

## 2019-05-19 DIAGNOSIS — K449 Diaphragmatic hernia without obstruction or gangrene: Secondary | ICD-10-CM | POA: Diagnosis not present

## 2019-05-19 DIAGNOSIS — M1612 Unilateral primary osteoarthritis, left hip: Secondary | ICD-10-CM | POA: Diagnosis not present

## 2019-05-19 DIAGNOSIS — R0602 Shortness of breath: Secondary | ICD-10-CM | POA: Diagnosis not present

## 2019-05-19 DIAGNOSIS — H6982 Other specified disorders of Eustachian tube, left ear: Secondary | ICD-10-CM | POA: Diagnosis not present

## 2019-05-19 LAB — COMPREHENSIVE METABOLIC PANEL
ALT: 19 U/L (ref 0–44)
AST: 20 U/L (ref 15–41)
Albumin: 3.8 g/dL (ref 3.5–5.0)
Alkaline Phosphatase: 82 U/L (ref 38–126)
Anion gap: 10 (ref 5–15)
BUN: 22 mg/dL (ref 8–23)
CO2: 22 mmol/L (ref 22–32)
Calcium: 10 mg/dL (ref 8.9–10.3)
Chloride: 104 mmol/L (ref 98–111)
Creatinine, Ser: 0.87 mg/dL (ref 0.44–1.00)
GFR calc Af Amer: >60 mL/min (ref >60)
GFR calc non Af Amer: >60 mL/min (ref >60)
Glucose, Bld: 109 mg/dL — ABNORMAL HIGH (ref 70–99)
Potassium: 4.1 mmol/L (ref 3.5–5.1)
Sodium: 136 mmol/L (ref 135–145)
Total Bilirubin: 0.9 mg/dL (ref 0.3–1.2)
Total Protein: 6.4 g/dL — ABNORMAL LOW (ref 6.5–8.1)

## 2019-05-19 LAB — CBC
HCT: 20.4 % — ABNORMAL LOW (ref 36.0–46.0)
Hemoglobin: 5.5 g/dL — CL (ref 12.0–15.0)
MCH: 21 pg — ABNORMAL LOW (ref 26.0–34.0)
MCHC: 27 g/dL — ABNORMAL LOW (ref 30.0–36.0)
MCV: 77.9 fL — ABNORMAL LOW (ref 80.0–100.0)
Platelets: 287 10*3/uL (ref 150–400)
RBC: 2.62 MIL/uL — ABNORMAL LOW (ref 3.87–5.11)
RDW: 21.2 % — ABNORMAL HIGH (ref 11.5–15.5)
WBC: 6.4 10*3/uL (ref 4.0–10.5)
nRBC: 0.3 % — ABNORMAL HIGH (ref 0.0–0.2)

## 2019-05-19 LAB — PREPARE RBC (CROSSMATCH)

## 2019-05-19 LAB — SARS CORONAVIRUS 2 BY RT PCR (HOSPITAL ORDER, PERFORMED IN ~~LOC~~ HOSPITAL LAB): SARS Coronavirus 2: NEGATIVE

## 2019-05-19 MED ORDER — OMEGA-3-ACID ETHYL ESTERS 1 G PO CAPS
1000.0000 mg | ORAL_CAPSULE | Freq: Every day | ORAL | Status: DC
  Administered 2019-05-20: 1000 mg via ORAL
  Filled 2019-05-19: qty 1

## 2019-05-19 MED ORDER — ACETAMINOPHEN 650 MG RE SUPP: 650.0000 mg | Freq: Four times a day (QID) | RECTAL | Status: DC | PRN

## 2019-05-19 MED ORDER — DULOXETINE HCL 60 MG PO CPEP
60.0000 mg | ORAL_CAPSULE | Freq: Every day | ORAL | Status: DC
  Administered 2019-05-20: 60 mg via ORAL
  Filled 2019-05-19: qty 1

## 2019-05-19 MED ORDER — SODIUM CHLORIDE 0.9 % IV SOLN
10.0000 mL/h | Freq: Once | INTRAVENOUS | Status: AC
  Administered 2019-05-20: 10 mL/h via INTRAVENOUS

## 2019-05-19 MED ORDER — GARLIC 2000 MG PO CAPS: 2000.0000 mg | ORAL_CAPSULE | Freq: Every day | ORAL | Status: DC

## 2019-05-19 MED ORDER — TURMERIC 500 MG PO CAPS: 500.0000 mg | ORAL_CAPSULE | Freq: Every day | ORAL | Status: DC

## 2019-05-19 MED ORDER — LORATADINE 10 MG PO TABS
10.0000 mg | ORAL_TABLET | Freq: Every day | ORAL | Status: DC
  Administered 2019-05-20: 10 mg via ORAL
  Filled 2019-05-19: qty 1

## 2019-05-19 MED ORDER — DOXEPIN HCL 10 MG PO CAPS
10.0000 mg | ORAL_CAPSULE | Freq: Every day | ORAL | Status: DC
  Administered 2019-05-20: 10 mg via ORAL
  Filled 2019-05-19: qty 1

## 2019-05-19 MED ORDER — PROSIGHT PO TABS: 1.0000 | ORAL_TABLET | Freq: Every day | ORAL | Status: DC

## 2019-05-19 MED ORDER — CINNAMON 500 MG PO CAPS: 500.0000 mg | ORAL_CAPSULE | Freq: Every day | ORAL | Status: DC

## 2019-05-19 MED ORDER — VITAMIN D 25 MCG (1000 UNIT) PO TABS
10000.0000 [IU] | ORAL_TABLET | Freq: Every day | ORAL | Status: DC
  Filled 2019-05-19: qty 10

## 2019-05-19 MED ORDER — RISAQUAD PO CAPS
1.0000 | ORAL_CAPSULE | Freq: Every day | ORAL | Status: DC
  Administered 2019-05-20: 1 via ORAL
  Filled 2019-05-19: qty 1

## 2019-05-19 MED ORDER — CALCIUM CARBONATE 1250 (500 CA) MG PO TABS
1.0000 | ORAL_TABLET | Freq: Every day | ORAL | Status: DC
  Administered 2019-05-20: 500 mg via ORAL
  Filled 2019-05-19: qty 1

## 2019-05-19 MED ORDER — SODIUM CHLORIDE 0.9 % IV BOLUS
500.0000 mL | Freq: Once | INTRAVENOUS | Status: AC
  Administered 2019-05-19: 500 mL via INTRAVENOUS

## 2019-05-19 MED ORDER — ONDANSETRON HCL 4 MG/2ML IJ SOLN: 4.0000 mg | Freq: Four times a day (QID) | INTRAMUSCULAR | Status: DC | PRN

## 2019-05-19 MED ORDER — VITAMIN C 500 MG PO TABS
500.0000 mg | ORAL_TABLET | Freq: Every day | ORAL | Status: DC
  Administered 2019-05-20: 500 mg via ORAL
  Filled 2019-05-19: qty 1

## 2019-05-19 MED ORDER — ONDANSETRON HCL 4 MG PO TABS: 4.0000 mg | ORAL_TABLET | Freq: Four times a day (QID) | ORAL | Status: DC | PRN

## 2019-05-19 MED ORDER — ACETAMINOPHEN 325 MG PO TABS: 650.0000 mg | ORAL_TABLET | Freq: Four times a day (QID) | ORAL | Status: DC | PRN

## 2019-05-19 MED ORDER — PANTOPRAZOLE SODIUM 40 MG IV SOLR: 40.0000 mg | Freq: Two times a day (BID) | INTRAVENOUS | Status: DC

## 2019-05-19 MED ORDER — PRAVASTATIN SODIUM 20 MG PO TABS
20.0000 mg | ORAL_TABLET | Freq: Every day | ORAL | Status: DC
  Administered 2019-05-20: 20 mg via ORAL
  Filled 2019-05-19: qty 1

## 2019-05-19 MED ORDER — ADULT MULTIVITAMIN W/MINERALS CH
1.0000 | ORAL_TABLET | Freq: Every day | ORAL | Status: DC
  Administered 2019-05-20: 1 via ORAL
  Filled 2019-05-19: qty 1

## 2019-05-19 NOTE — H&P (Signed)
History and Physical    Molly Lucas [facility] ZJQ:[id number] DOB: [birth year] DOA: 05/19/2019  Referring MD/NP/PA:   PCP: [name], MD   Patient coming from:  The patient is coming from home.  At baseline, pt is independent for most of ADL.        Chief Complaint: Shortness of breath, lightheadedness  HPI: Molly Lucas is a 73 y.o. female with medical history significant of hyperlipidemia, OSA not on CPAP, iron deficiency anemia, GERD, depression, anxiety, GI bleeding, AVM, gastric ulcer, who presents with shortness breath and lightheadedness.  Patient states that she has been having mild shortness of breath on exertion, and lightheadedness in the past several days.  She went to PCPs office this AM, and was found to have hemoglobin 6.0.  Patient denies any nausea, vomiting, diarrhea or abdominal pain.  Denies any dark stool or rectal bleeding.  Patient does not have chest pain, cough, fever or chills.  No symptoms of UTI or unilateral weakness.  Patient states that her GI doctor, Dr. Earlean Shawl in [facility] told her that she should not take Pepcid and Protonix. She is not taking iron supplement.  She states if her iron is low, she usually needs IV iron infusion. She states that she had follow-up at [facility] for a pill swallow study, which revealed AVMs of the small bowel. Of note, pt [facility] in [facility] due to GIB, and had negative coloscopy and endoscopy by Dr. Tarri Glenn.  ED Course: pt was found to have hemoglobin 5.5 (7.9 on 03/31/2019), pending FOBT, pending COVID-19 test, electrolytes renal function okay, temperature normal heart rate 86, oxygen saturation 96% on room air, blood pressure 129/85.  Patient is placed on telemetry bed for observation.  Review of Systems:   General: no fevers, chills, no body weight gain, has fatigue HEENT: no blurry vision, hearing changes or sore throat Respiratory: has dyspnea, no coughing, wheezing CV: no chest pain, no palpitations GI:  no nausea, vomiting, abdominal pain, diarrhea, constipation GU: no dysuria, burning on urination, increased urinary frequency, hematuria  Ext: no leg edema Neuro: no unilateral weakness, numbness, or tingling, no vision change or hearing loss.  Has lightheadedness. Skin: no rash, no skin tear. MSK: No muscle spasm, no deformity, no limitation of range of movement in spin Heme: No easy bruising.  Travel history: No recent long distant travel.  Allergy:  Allergies  Allergen Reactions  . Penicillins Swelling and Other (See Comments)    Tongue became swollen and turned black Did it involve swelling of the face/tongue/throat, SOB, or low BP? Yes Did it involve sudden or severe rash/hives, skin peeling, or any reaction on the inside of your mouth or nose? Yes Did you need to seek medical attention at a hospital or doctor's office? Yes When did it last happen? "I was a child" If all above answers are "NO", may proceed with cephalosporin use.     Past Medical History:  Diagnosis Date  . Anemia    Takes iron supplement  . Anxiety   . Arthritis   . Chronic pain of left thumb   . Constipation   . 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 Depression   . Fibroid, uterine   . GERD (gastroesophageal reflux disease)   . History of blood transfusion   . Hx of seasonal allergies   . Hyperlipemia   . Hypertension    hx of off medication now.  Marland Kitchen  OA (osteoarthritis) of hip    left  . Sleep apnea    dentist made mouthipiece     Past Surgical History:  Procedure Laterality Date  . BIOPSY  03/30/2019   Procedure: BIOPSY;  Surgeon: Thornton Park, MD;  Location: [place];  Service: Gastroenterology;;  . [place]  08/2015   with abductor pollicis longus tendon transfer and suspensionplasty  . COLONOSCOPY WITH PROPOFOL N/A 03/31/2019   Procedure: COLONOSCOPY WITH PROPOFOL;  Surgeon: Thornton Park, MD;  Location: [place];  Service: Gastroenterology;  Laterality: N/A;  . DE QUERVAIN'S RELEASE  08/2015  . DILATATION & CURRETTAGE/HYSTEROSCOPY WITH RESECTOCOPE  2007  . ESOPHAGOGASTRODUODENOSCOPY N/A 11/30/2015   Procedure: ESOPHAGOGASTRODUODENOSCOPY (EGD);  Surgeon: Richmond Campbell, MD;  Location: [facility];  Service: Endoscopy;  Laterality: N/A;  . ESOPHAGOGASTRODUODENOSCOPY (EGD) WITH PROPOFOL N/A 03/30/2019   Procedure: ESOPHAGOGASTRODUODENOSCOPY (EGD) WITH PROPOFOL;  Surgeon: Thornton Park, MD;  Location: [place];  Service: Gastroenterology;  Laterality: N/A;  . HERNIA REPAIR Left 06/2012  . INGUINAL HERNIA REPAIR Right 05/03/2013   Procedure: HERNIA REPAIR INGUINAL ADULT;  Surgeon: Harl Bowie, MD;  Location: [place];  Service: General;  Laterality: Right;  . INGUINAL HERNIA REPAIR Right 08/23/2013   Procedure: LAPAROSCOPIC INGUINAL HERNIA;  Surgeon: Harl Bowie, MD;  Location: [place];  Service: General;  Laterality: Right;  . INSERTION OF MESH Right 05/03/2013   Procedure: INSERTION OF MESH;  Surgeon: Harl Bowie, MD;  Location: [place];  Service: General;  Laterality: Right;  . INSERTION OF MESH Right 08/23/2013   Procedure: INSERTION OF MESH;  Surgeon: Harl Bowie, MD;  Location: [place];  Service: General;  Laterality: Right;  . METACARPOPHALANGEAL JOINT ARTHRODESIS  08/2015   with local bone graft  . TONSILLECTOMY    . TOTAL HIP ARTHROPLASTY  06/24/2012   Procedure: TOTAL HIP ARTHROPLASTY ANTERIOR APPROACH;  Surgeon: Mcarthur Rossetti, MD;  Location: [facility];  Service: Orthopedics;  Laterality: Left;  Left total hip arthroplasty, anterior approach  . UTERINE FIBROID SURGERY  2007  . WISDOM TOOTH EXTRACTION      Social History:  reports that she has never smoked. She has never used smokeless tobacco. She reports current alcohol use of about 2.0 standard drinks of alcohol per week. She reports that she does not use drugs.  Family History:  Family History   Problem Relation Age of Onset  . Heart failure Mother   . Osteoporosis Mother   . Arthritis Mother   . CAD Maternal Grandfather   . Heart failure Maternal Grandfather   . Dementia Maternal Grandfather   . CAD Paternal Grandmother   . CAD Paternal Grandfather   . Arthritis Sister      Prior to Admission medications   Medication Sig Start Date End Date Taking? Authorizing Provider  CALCIUM PO Take 2 tablets by mouth daily.     [provider]  Camphor-Eucalyptus-Menthol (VICKS VAPORUB) 4.2-6.8-3.4 % OINT 1 application See admin instructions. Apply under the nostrils at bedtime    [provider]  Cholecalciferol (VITAMIN D3) 250 MCG (10000 UT) TABS Take 10,000 Units by mouth daily.     [provider]  Cinnamon 500 MG capsule Take 500 mg by mouth daily.    [provider]  Doxepin HCl (SILENOR) 6 MG TABS Take 6 mg by mouth at bedtime.     [provider]  DULoxetine (CYMBALTA) 30 MG capsule Take 60 mg by mouth at bedtime. 03/12/19  [provider]  famotidine (PEPCID) 20 MG tablet Take 1 tablet (20 mg total) by mouth 2 (two) times daily. 03/31/19   Nita Sells, MD  Garlic 6767 MG CAPS Take 2,000 mg by mouth daily.     [provider]  Multiple Vitamins-Minerals (HAIR/SKIN/NAILS) TABS Take 1 tablet by mouth daily.    [provider]  multivitamin-lutein (OCUVITE-LUTEIN) CAPS Take 1 capsule by mouth daily.    [provider]  NON FORMULARY Take 1 capsule by mouth See admin instructions. Black [place] oil capsules: Take 1 capsule by mouth once a day    [provider]  Omega-3 1000 MG CAPS Take 2,000 mg by mouth daily.     [provider]  pantoprazole (PROTONIX) 40 MG tablet Take 1 tablet (40 mg total) by mouth 2 (two) times daily for 30 days. 03/31/19 04/30/19  Nita Sells, MD  pravastatin (PRAVACHOL) 20 MG tablet Take 20 mg by mouth at bedtime.     [provider]   Probiotic Product (PROBIOTIC DAILY PO) Take 1 capsule by mouth daily.     [provider]  Turmeric 500 MG CAPS Take 500 mg by mouth daily.     [provider]  vitamin C (ASCORBIC ACID) 500 MG tablet Take 500 mg by mouth daily.    [provider]    Physical Exam: Vitals:   05/19/19 1840 05/19/19 2037  BP: 129/85 127/74  Pulse: 86 85  Resp: 18 16  Temp: 98.6 F (37 C)   TempSrc: Oral   SpO2: 97% 95%   General: Not in acute distress.  Pale looking. HEENT:       Eyes: PERRL, EOMI, no scleral icterus.       ENT: No discharge from the ears and nose, no pharynx injection, no tonsillar enlargement.        Neck: No JVD, no bruit, no mass felt. Heme: No neck lymph node enlargement. Cardiac: S1/S2, RRR, No murmurs, No gallops or rubs. Respiratory: Good air movement bilaterally. No rales, wheezing, rhonchi or rubs. GI: Soft, nondistended, nontender, no rebound pain, no organomegaly, BS present. GU: No hematuria Ext: No pitting leg edema bilaterally. 2+DP/PT pulse bilaterally. Musculoskeletal: No joint deformities, No joint redness or warmth, no limitation of ROM in spin. Skin: No rashes.  Neuro: Alert, oriented X3, cranial nerves II-XII grossly intact, moves all extremities normally. Psych: Patient is not psychotic, no suicidal or hemocidal ideation.  Labs on Admission: I have personally reviewed following labs and imaging studies  CBC: Recent Labs  Lab 05/19/19 1908  WBC 6.4  HGB 5.5*  HCT 20.4*  MCV 77.9*  PLT 209   Basic Metabolic Panel: Recent Labs  Lab 05/19/19 1908  NA 136  K 4.1  CL 104  CO2 22  GLUCOSE 109*  BUN 22  CREATININE 0.87  CALCIUM 10.0   GFR: CrCl cannot be calculated (Unknown ideal weight.). Liver Function Tests: Recent Labs  Lab 05/19/19 1908  AST 20  ALT 19  ALKPHOS 82  BILITOT 0.9  PROT 6.4*  ALBUMIN 3.8   No results for input(s): LIPASE, AMYLASE in the last 168 hours. No results for input(s): AMMONIA in  the last 168 hours. Coagulation Profile: No results for input(s): INR, PROTIME in the last 168 hours. Cardiac Enzymes: No results for input(s): CKTOTAL, CKMB, CKMBINDEX, TROPONINI in the last 168 hours. BNP (last 3 results) No results for input(s): PROBNP in the last 8760 hours. HbA1C: No results for input(s): HGBA1C in the  last 72 hours. CBG: No results for input(s): GLUCAP in the last 168 hours. Lipid Profile: No results for input(s): CHOL, HDL, LDLCALC, TRIG, CHOLHDL, LDLDIRECT in the last 72 hours. Thyroid Function Tests: No results for input(s): TSH, T4TOTAL, FREET4, T3FREE, THYROIDAB in the last 72 hours. Anemia Panel: No results for input(s): VITAMINB12, FOLATE, FERRITIN, TIBC, IRON, RETICCTPCT in the last 72 hours. Urine analysis:    Component Value Date/Time   COLORURINE YELLOW 06/16/2012 1025   APPEARANCEUR CLOUDY (A) 06/16/2012 1025   LABSPEC 1.025 06/16/2012 1025   PHURINE 5.5 06/16/2012 1025   GLUCOSEU NEGATIVE 06/16/2012 1025   HGBUR NEGATIVE 06/16/2012 1025   [place] 06/16/2012 1025   KETONESUR NEGATIVE 06/16/2012 1025   PROTEINUR NEGATIVE 06/16/2012 1025   UROBILINOGEN 0.2 06/16/2012 1025   NITRITE NEGATIVE 06/16/2012 1025   LEUKOCYTESUR MODERATE (A) 06/16/2012 1025   Sepsis Labs: @LABRCNTIP (procalcitonin:4,lacticidven:4) )No results found for this or any previous visit (from the past 240 hour(s)).   Radiological Exams on Admission: Dg Bone Density (dxa)  Result Date: 05/18/2019 EXAM: DUAL X-RAY ABSORPTIOMETRY (DXA) FOR BONE MINERAL DENSITY IMPRESSION: Referring Physician:  MODY, [place] Your patient completed a BMD test using Lunar IDXA DXA system ( analysis version: 16 ) manufactured by [employer]. Technologist: KT PATIENT: Name: YANNIS, GUMBS Patient ID: [id number] Birth Date: [birth year] Height: 61.5 in. Sex: Female Measured: 05/18/2019 Weight: 201.8 lbs. Indications: Advanced Age, Caucasian, cymbalta, Depression, Estrogen Deficient, Family  History of Osteoporosis, Height Loss (781.91), Left hip replaced, Postmenopausal Fractures: None Treatments: Calcium (E943.0), Vitamin D (E933.5) ASSESSMENT: The BMD measured at Forearm Radius 33% is 0.569 g/cm2 with a T-score of -3.6. This patient's diagnostic category is OSTEOPOROSIS according to [place] [facility]) criteria. There has been a statistically significant decrease in BMD of right hip since prior exam dated 06/14/2012. The scan quality is good. Left femur was excluded due to surgical hardware. Lumbar spine was not utilized due to scoliosis. Site Region Measured Date Measured Age YA BMD Significant CHANGE T-score Left Forearm Radius 33% 05/18/2019 74.7 -3.6 0.569 g/cm2 Right Femur Neck 05/18/2019 74.7 -1.1 0.883 g/cm2 * Right Femur Neck 06/14/2012 67.7 -0.5 0.975 g/cm2 World Health Organization [facility]) criteria for post-menopausal, Caucasian Women: Normal       T-score at or above -1 SD Osteopenia   T-score between -1 and -2.5 SD Osteoporosis T-score at or below -2.5 SD RECOMMENDATION: 1. All patients should optimize calcium and vitamin D intake. 2. Consider FDA approved medical therapies in postmenopausal women and men aged 15 years and older, based on the following: a. A hip or vertebral (clinical or morphometric) fracture b. T- score < or = -2.5 at the femoral neck or spine after appropriate evaluation to exclude secondary causes c. Low bone mass (T-score between -1.0 and -2.5 at the femoral neck or spine) and a 10 year probability of a hip fracture > or = 3% or a 10 year probability of a major osteoporosis-related fracture > or = 20% based on the [place]-adapted WHO algorithm d. Clinician judgment and/or patient preferences may indicate treatment for people with 10-year fracture probabilities above or below these levels FOLLOW-UP: Patients with diagnosis of osteoporosis or at high risk for fracture should have regular bone mineral density tests. For patients eligible for Medicare, routine  testing is allowed once every 2 years. The testing frequency can be increased to one year for patients who have rapidly progressing disease, those who are receiving or discontinuing medical therapy to restore bone mass, or have additional  risk factors. I have reviewed this report and agree with the above findings. [facility] Radiology Electronically Signed   By: Margarette Canada M.D.   On: 05/18/2019 12:02     EKG:  Not done in ED, will get one.   Assessment/Plan Principal Problem:   Symptomatic anemia Active Problems:   HLD (hyperlipidemia)   Iron deficiency anemia   GERD (gastroesophageal reflux disease)   Depression   Symptomatic anemia: likely due to occult GIB. Has hx of AVM. Also has hx of gastric ulcer, but recent EGD in May was negative for ulcer. Hgb dropped from 7.9 on 03/31/19 to 5.5. has shortness of breath on exertion and lightheadedness.  Currently hemodynamically stable.  Denies dark stool or rectal bleeding. Pending FOBT.  - will place in tele bed for obs - transfuse 2 units of blood now - NPO  - IVF: 500 L NS bolus - No protonix or pepcid as pt's GI doctor recommded - Zofran IV for nausea - Avoid NSAIDs and SQ heparin - Maintain IV access (2 large bore IVs if possible). - Monitor closely and follow q6h cbc, transfuse as necessary, if Hgb<7.0 - La[facility]: INR, PTT and type screen - check Anemia panel - please call GI in AM  HLD (hyperlipidemia): -Pravastatin  Iron deficiency anemia: not taking iron supplement.  Does not want to take oral iron supplement. - anemia panel.  If confirms rion deficiencies, will give IV iron.  GERD (gastroesophageal reflux disease): -Does not want to take Pepcid or Protonix as his GI doctor recommended.  Depression: Stable, no suicidal or homicidal ideations. -Continue home medications: Doxepin and Cymbalta    DVT ppx: SCD Code Status: Full code Family Communication: None at bed side.    Disposition Plan:  Anticipate discharge back to  previous home environment Consults called:  none Admission status: Obs / tele    Date of Service 05/19/2019    [facility]   If 7PM-7AM, please contact night-coverage [email or web address] Password [email or web address] 05/19/2019, 9:11 PM

## 2019-05-19 NOTE — ED Triage Notes (Signed)
Patient states she had blood drawn this morning.   Patient states her hemoglobin is 6.  Patient states it was at that level in May and was admitted at [facility] and given transfusion, coloscopy, and endoscopy, and did not find source form bleeding.    Hx. Anemia    A/ox4 Ambulatory in triage.

## 2019-05-19 NOTE — ED Notes (Signed)
Pt signed for blood transfusion electronically.

## 2019-05-19 NOTE — ED Notes (Signed)
Date and time results received: 05/19/19 7:49 PM (use smartphrase ".now" to insert current time)  Test: Hemoglobin Critical Value: 5.5  Name of Provider Notified: Bero  Orders Received? Or Actions Taken?: Provider made aware

## 2019-05-19 NOTE — ED Notes (Signed)
ED TO INPATIENT HANDOFF REPORT  ED Nurse Name and Phone #: Noni Saupe Name/Age/Gender [place] 73 y.o. female Room/Bed: WA07/WA07  Code Status   Code Status: Prior  Home/SNF/Other Given to floor Patient oriented to: self, place, time and situation Is this baseline? Yes   Triage Complete: Triage complete  Chief Complaint Low Hemoglobin abnormal labs.    Triage Note Patient states she had blood drawn this morning.   Patient states her hemoglobin is 6.  Patient states it was at that level in May and was [facility] and given transfusion, coloscopy, and endoscopy, and did not find source form bleeding.    Hx. Anemia    A/ox4 Ambulatory in triage.    Allergies Allergies  Allergen Reactions  . Penicillins Swelling and Other (See Comments)    Tongue became swollen and turned black Did it involve swelling of the face/tongue/throat, SOB, or low BP? Yes Did it involve sudden or severe rash/hives, skin peeling, or any reaction on the inside of your mouth or nose? Yes Did you need to seek medical attention at a hospital or doctor's office? Yes When did it last happen? "I was a child" If all above answers are "NO", may proceed with cephalosporin use.     Level of Care/Admitting Diagnosis ED Disposition    ED Disposition Condition Comment   Admit  Hospital Area: [place] [[id number]]  Level of Care: Telemetry [5]  Admit to tele based on following criteria: Other see comments  Comments: Symptomatic anemia  Covid Evaluation: Asymptomatic Screening Protocol (No Symptoms)  Diagnosis: Symptomatic anemia [2703500]  Admitting Physician: Ivor Costa [[id number]]  Attending Physician: Ivor Costa [[id number]]  PT Class (Do Not Modify): Observation [104]  PT Acc Code (Do Not Modify): Observation [10022]       B Medical/Surgery History Past Medical History:  Diagnosis Date  . Anemia    Takes iron supplement  . Anxiety   . Arthritis   .  Chronic pain of left thumb   . Constipation   . 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 Depression   . Fibroid, uterine   . GERD (gastroesophageal reflux disease)   . History of blood transfusion   . Hx of seasonal allergies   . Hyperlipemia   . Hypertension    hx of off medication now.  . OA (osteoarthritis) of hip    left  . Sleep apnea    dentist made mouthipiece    Past Surgical History:  Procedure Laterality Date  . BIOPSY  03/30/2019   Procedure: BIOPSY;  Surgeon: Thornton Park, MD;  Location: [place];  Service: Gastroenterology;;  . [place]  08/2015   with abductor pollicis longus tendon transfer and suspensionplasty  . COLONOSCOPY WITH PROPOFOL N/A 03/31/2019   Procedure: COLONOSCOPY WITH PROPOFOL;  Surgeon: Thornton Park, MD;  Location: [place];  Service: Gastroenterology;  Laterality: N/A;  . DE QUERVAIN'S RELEASE  08/2015  . DILATATION & CURRETTAGE/HYSTEROSCOPY WITH RESECTOCOPE  2007  . ESOPHAGOGASTRODUODENOSCOPY N/A 11/30/2015   Procedure: ESOPHAGOGASTRODUODENOSCOPY (EGD);  Surgeon: Richmond Campbell, MD;  [facility];  Service: Endoscopy;  Laterality: N/A;  . ESOPHAGOGASTRODUODENOSCOPY (EGD) WITH PROPOFOL N/A 03/30/2019   Procedure: ESOPHAGOGASTRODUODENOSCOPY (EGD) WITH PROPOFOL;  Surgeon: Thornton Park, MD;  Location: [place];  Service: Gastroenterology;  Laterality: N/A;  . HERNIA REPAIR Left 06/2012  . INGUINAL HERNIA REPAIR Right 05/03/2013   Procedure: HERNIA REPAIR INGUINAL  ADULT;  Surgeon: Harl Bowie, MD;  Location: [place];  Service: General;  Laterality: Right;  . INGUINAL HERNIA REPAIR Right 08/23/2013   Procedure: LAPAROSCOPIC INGUINAL HERNIA;  Surgeon: Harl Bowie, MD;  Location: [place];  Service: General;  Laterality: Right;  . INSERTION OF MESH Right 05/03/2013   Procedure: INSERTION OF MESH;  Surgeon: Harl Bowie, MD;  Location: [place];   Service: General;  Laterality: Right;  . INSERTION OF MESH Right 08/23/2013   Procedure: INSERTION OF MESH;  Surgeon: Harl Bowie, MD;  Location: [place];  Service: General;  Laterality: Right;  . METACARPOPHALANGEAL JOINT ARTHRODESIS  08/2015   with local bone graft  . TONSILLECTOMY    . TOTAL HIP ARTHROPLASTY  06/24/2012   Procedure: TOTAL HIP ARTHROPLASTY ANTERIOR APPROACH;  Surgeon: Mcarthur Rossetti, MD;  [facility];  Service: Orthopedics;  Laterality: Left;  Left total hip arthroplasty, anterior approach  . UTERINE FIBROID SURGERY  2007  . WISDOM TOOTH EXTRACTION       A IV Location/Drains/Wounds Patient Lines/Drains/Airways Status   Active Line/Drains/Airways    Name:   Placement date:   Placement time:   Site:   Days:   Peripheral IV 05/19/19 Left Forearm   05/19/19    1914    Forearm   less than 1   Peripheral IV 05/19/19 Left Antecubital   05/19/19    2109    Antecubital   less than 1          Intake/Output Last 24 hours No intake or output data in the 24 hours ending 05/19/19 2150  Labs/Imaging Results for orders placed or performed during the hospital encounter of 05/19/19 (from the past 48 hour(s))  Comprehensive metabolic panel     Status: Abnormal   Collection Time: 05/19/19  7:08 PM  Result Value Ref Range   Sodium 136 135 - 145 mmol/L   Potassium 4.1 3.5 - 5.1 mmol/L   Chloride 104 98 - 111 mmol/L   CO2 22 22 - 32 mmol/L   Glucose, Bld 109 (H) 70 - 99 mg/dL   BUN 22 8 - 23 mg/dL   Creatinine, Ser 0.87 0.44 - 1.00 mg/dL   Calcium 10.0 8.9 - 10.3 mg/dL   Total Protein 6.4 (L) 6.5 - 8.1 g/dL   Albumin 3.8 3.5 - 5.0 g/dL   AST 20 15 - 41 U/L   ALT 19 0 - 44 U/L   Alkaline Phosphatase 82 38 - 126 U/L   Total Bilirubin 0.9 0.3 - 1.2 mg/dL   GFR calc non Af Amer >60 >60 mL/min   GFR calc Af Amer >60 >60 mL/min   Anion gap 10 5 - 15    Comment: Performed at [facility], [place] [address], [place], [place] [address]  CBC      Status: Abnormal   Collection Time: 05/19/19  7:08 PM  Result Value Ref Range   WBC 6.4 4.0 - 10.5 K/uL   RBC 2.62 (L) 3.87 - 5.11 MIL/uL   Hemoglobin 5.5 (LL) 12.0 - 15.0 g/dL    Comment: Reticulocyte Hemoglobin testing may be clinically indicated, consider ordering this additional test [id number] THIS CRITICAL RESULT HAS VERIFIED AND BEEN CALLED TO LACIVITA,H. RN BY KATHLEEN COHEN ON 07 10 2020 AT 1948, AND HAS BEEN READ BACK. CRITICAL RESULT VERIFIED    HCT 20.4 (L) 36.0 - 46.0 %   MCV 77.9 (L) 80.0 - 100.0 fL   MCH 21.0 (L)  26.0 - 34.0 pg   MCHC 27.0 (L) 30.0 - 36.0 g/dL   RDW 21.2 (H) 11.5 - 15.5 %   Platelets 287 150 - 400 K/uL   nRBC 0.3 (H) 0.0 - 0.2 %    Comment: Performed at [facility], [place] [address], [place], [place] [address]  Type and screen [place]     Status: None (Preliminary result)   Collection Time: 05/19/19  7:08 PM  Result Value Ref Range   ABO/RH(D) O POS    Antibody Screen NEG    Sample Expiration 05/22/2019,2359    Unit Number [id number]    Blood Component Type RBC LR PHER1    Unit division 00    Status of Unit ALLOCATED    Transfusion Status OK TO TRANSFUSE    Crossmatch Result      Compatible Performed at [facility], [place] [address], [place], [place] [address]    Unit Number [id number]    Blood Component Type RED CELLS,LR    Unit division 00    Status of Unit ALLOCATED    Transfusion Status OK TO TRANSFUSE    Crossmatch Result Compatible   Prepare RBC     Status: None   Collection Time: 05/19/19  8:22 PM  Result Value Ref Range   Order Confirmation      ORDER PROCESSED BY BLOOD BANK Performed at [facility], [place] [address], [place], [place] [address]    Dg Bone Density (dxa)  Result Date: 05/18/2019 EXAM: DUAL X-RAY ABSORPTIOMETRY (DXA) FOR BONE MINERAL DENSITY IMPRESSION: Referring Physician:  MODY, [place] Your patient completed a BMD test using Lunar  IDXA DXA system ( analysis version: 16 ) manufactured by [employer]. Technologist: KT PATIENT: Name: JACQUEL, REDDITT Patient ID: [id number] Birth Date: 1946/06/04 Height: 61.5 in. Sex: Female Measured: 05/18/2019 Weight: 201.8 lbs. Indications: Advanced Age, Caucasian, cymbalta, Depression, Estrogen Deficient, Family History of Osteoporosis, Height Loss (781.91), Left hip replaced, Postmenopausal Fractures: None Treatments: Calcium (E943.0), Vitamin D (E933.5) ASSESSMENT: The BMD measured at Forearm Radius 33% is 0.569 g/cm2 with a T-score of -3.6. This patient's diagnostic category is OSTEOPOROSIS according to [facility]) criteria. There has been a statistically significant decrease in BMD of right hip since prior exam dated 06/14/2012. The scan quality is good. Left femur was excluded due to surgical hardware. Lumbar spine was not utilized due to scoliosis. Site Region Measured Date Measured Age YA BMD Significant CHANGE T-score Left Forearm Radius 33% 05/18/2019 74.7 -3.6 0.569 g/cm2 Right Femur Neck 05/18/2019 74.7 -1.1 0.883 g/cm2 * Right Femur Neck 06/14/2012 67.7 -0.5 0.975 g/cm2 World Health Organization [facility]) criteria for post-menopausal, Caucasian Women: Normal       T-score at or above -1 SD Osteopenia   T-score between -1 and -2.5 SD Osteoporosis T-score at or below -2.5 SD RECOMMENDATION: 1. All patients should optimize calcium and vitamin D intake. 2. Consider FDA approved medical therapies in postmenopausal women and men aged 81 years and older, based on the following: a. A hip or vertebral (clinical or morphometric) fracture b. T- score < or = -2.5 at the femoral neck or spine after appropriate evaluation to exclude secondary causes c. Low bone mass (T-score between -1.0 and -2.5 at the femoral neck or spine) and a 10 year probability of a hip fracture > or = 3% or a 10 year probability of a major osteoporosis-related fracture > or = 20% based on the [place]-adapted WHO algorithm  d.  Clinician judgment and/or patient preferences may indicate treatment for people with 10-year fracture probabilities above or below these levels FOLLOW-UP: Patients with diagnosis of osteoporosis or at high risk for fracture should have regular bone mineral density tests. For patients eligible for Medicare, routine testing is allowed once every 2 years. The testing frequency can be increased to one year for patients who have rapidly progressing disease, those who are receiving or discontinuing medical therapy to restore bone mass, or have additional risk factors. I have reviewed this report and agree with the above findings. [facility] Radiology Electronically Signed   By: Margarette Canada M.D.   On: 05/18/2019 12:02    Pending Labs Unresulted Labs (From admission, onward)    Start     Ordered   05/19/19 2031  Vitamin B12  (Anemia Panel (PNL))  Once,   STAT     05/19/19 2030   05/19/19 2031  Folate  (Anemia Panel (PNL))  Once,   STAT     05/19/19 2030   05/19/19 2031  Iron and TIBC  (Anemia Panel (PNL))  Once,   STAT     05/19/19 2030   05/19/19 2031  Ferritin  (Anemia Panel (PNL))  Once,   STAT     05/19/19 2030   05/19/19 2031  Reticulocytes  (Anemia Panel (PNL))  Once,   STAT     05/19/19 2030   05/19/19 2022  SARS Coronavirus 2 (CEPHEID - Performed in [place] hospital lab), Hosp Order  (Asymptomatic Patients Labs)  Once,   STAT    Question:  Rule Out  Answer:  Yes   05/19/19 2022   Signed and Held  CBC  Now then every 6 hours,   R     Signed and Held   Signed and Held  Basic metabolic panel  Tomorrow morning,   R     Signed and Held          Vitals/Pain Today's Vitals   05/19/19 1836 05/19/19 1840 05/19/19 2037  BP:  129/85 127/74  Pulse:  86 85  Resp:  18 16  Temp:  98.6 F (37 C)   TempSrc:  Oral   SpO2:  97% 95%  PainSc: 0-No pain      Isolation Precautions No active isolations  Medications Medications  0.9 %  sodium chloride infusion (has no administration in time  range)  sodium chloride 0.9 % bolus 500 mL (has no administration in time range)    Mobility walks Low fall risk   Focused Assessments Cardiac Assessment Handoff:    No results found for: CKTOTAL, CKMB, CKMBINDEX, TROPONINI No results found for: DDIMER Does the Patient currently have chest pain? No      R Recommendations: See Admitting Provider Note  Report given to:   Additional Notes: Low hemoglobin

## 2019-05-19 NOTE — ED Provider Notes (Signed)
[facility] Emergency Department Provider Note MRN:  [id number]  Arrival date & time: 05/19/19     Chief Complaint   low hemoglobin (6.0)   History of Present Illness   Molly Lucas is a 73 y.o. year-old female with a history of GI bleeding presenting to the ED with chief complaint of low hemoglobin.  For the past 2 to 3 weeks, patient has been experiencing progressively worsening dyspnea on exertion, lightheadedness upon standing.  She was seen by PCP this morning, confirmed a hemoglobin of 6.0.  She explains that she has had GI bleeding in the past and has had a difficult time getting a diagnosis.  She had to follow-up at [facility] for a pill swallow study, which revealed AVMs of the small bowel.  Currently denies pain.  Review of Systems  A complete 10 system review of systems was obtained and all systems are negative except as noted in the HPI and PMH.   Patient's Health History    Past Medical History:  Diagnosis Date  . Anemia    Takes iron supplement  . Anxiety   . Arthritis   . Chronic pain of left thumb   . Constipation   . 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 Depression   . Fibroid, uterine   . GERD (gastroesophageal reflux disease)   . History of blood transfusion   . Hx of seasonal allergies   . Hyperlipemia   . Hypertension    hx of off medication now.  . OA (osteoarthritis) of hip    left  . Sleep apnea    dentist made mouthipiece     Past Surgical History:  Procedure Laterality Date  . BIOPSY  03/30/2019   Procedure: BIOPSY;  Surgeon: Thornton Park, MD;  Location: [place];  Service: Gastroenterology;;  . [place]  08/2015   with abductor pollicis longus tendon transfer and suspensionplasty  . COLONOSCOPY WITH PROPOFOL N/A 03/31/2019   Procedure: COLONOSCOPY WITH PROPOFOL;  Surgeon: Thornton Park, MD;  Location: [place];  Service:  Gastroenterology;  Laterality: N/A;  . DE QUERVAIN'S RELEASE  08/2015  . DILATATION & CURRETTAGE/HYSTEROSCOPY WITH RESECTOCOPE  2007  . ESOPHAGOGASTRODUODENOSCOPY N/A 11/30/2015   Procedure: ESOPHAGOGASTRODUODENOSCOPY (EGD);  Surgeon: Richmond Campbell, MD;  [facility];  Service: Endoscopy;  Laterality: N/A;  . ESOPHAGOGASTRODUODENOSCOPY (EGD) WITH PROPOFOL N/A 03/30/2019   Procedure: ESOPHAGOGASTRODUODENOSCOPY (EGD) WITH PROPOFOL;  Surgeon: Thornton Park, MD;  Location: [place];  Service: Gastroenterology;  Laterality: N/A;  . HERNIA REPAIR Left 06/2012  . INGUINAL HERNIA REPAIR Right 05/03/2013   Procedure: HERNIA REPAIR INGUINAL ADULT;  Surgeon: Harl Bowie, MD;  Location: [place];  Service: General;  Laterality: Right;  . INGUINAL HERNIA REPAIR Right 08/23/2013   Procedure: LAPAROSCOPIC INGUINAL HERNIA;  Surgeon: Harl Bowie, MD;  Location: [place];  Service: General;  Laterality: Right;  . INSERTION OF MESH Right 05/03/2013   Procedure: INSERTION OF MESH;  Surgeon: Harl Bowie, MD;  Location: [place];  Service: General;  Laterality: Right;  . INSERTION OF MESH Right 08/23/2013   Procedure: INSERTION OF MESH;  Surgeon: Harl Bowie, MD;  Location: [place];  Service: General;  Laterality: Right;  . METACARPOPHALANGEAL JOINT ARTHRODESIS  08/2015   with local bone graft  . TONSILLECTOMY    . TOTAL HIP ARTHROPLASTY  06/24/2012   Procedure: TOTAL HIP ARTHROPLASTY ANTERIOR APPROACH;  Surgeon: Mcarthur Rossetti,  MD;  [facility];  Service: Orthopedics;  Laterality: Left;  Left total hip arthroplasty, anterior approach  . UTERINE FIBROID SURGERY  2007  . WISDOM TOOTH EXTRACTION      Family History  Problem Relation Age of Onset  . Heart failure Mother   . Osteoporosis Mother   . Arthritis Mother   . CAD Maternal Grandfather   . Heart failure Maternal Grandfather   . Dementia Maternal Grandfather   . CAD Paternal Grandmother   . CAD Paternal  Grandfather   . Arthritis Sister     Social History   Socioeconomic History  . Marital status: Widowed    Spouse name: Not on file  . Number of children: Not on file  . Years of education: Not on file  . Highest education level: Not on file  Occupational History  . Not on file  Social Needs  . Financial resource strain: Not on file  . Food insecurity    Worry: Not on file    Inability: Not on file  . Transportation needs    Medical: Not on file    Non-medical: Not on file  Tobacco Use  . Smoking status: Never Smoker  . Smokeless tobacco: Never Used  Substance and Sexual Activity  . Alcohol use: Yes    Alcohol/week: 2.0 standard drinks    Types: 2 Glasses of wine per week    Comment: occasional glass of wine   . Drug use: No  . Sexual activity: Not on file  Lifestyle  . Physical activity    Days per week: Not on file    Minutes per session: Not on file  . Stress: Not on file  Relationships  . Social Herbalist on phone: Not on file    Gets together: Not on file    Attends religious service: Not on file    Active member of club or organization: Not on file    Attends meetings of clubs or organizations: Not on file    Relationship status: Not on file  . Intimate partner violence    Fear of current or ex partner: Not on file    Emotionally abused: Not on file    Physically abused: Not on file    Forced sexual activity: Not on file  Other Topics Concern  . Not on file  Social History Narrative  . Not on file     Physical Exam  Vital Signs and Nursing Notes reviewed Vitals:   05/19/19 1840  BP: 129/85  Pulse: 86  Resp: 18  Temp: 98.6 F (37 C)  SpO2: 97%    CONSTITUTIONAL: Well-appearing, NAD, slightly pale NEURO:  Alert and oriented x 3, no focal deficits EYES:  eyes equal and reactive ENT/NECK:  no LAD, no JVD CARDIO: Regular rate, well-perfused, normal S1 and S2 PULM:  CTAB no wheezing or rhonchi GI/GU:  normal bowel sounds,  non-distended, non-tender MSK/SPINE:  No gross deformities, no edema SKIN:  no rash, atraumatic PSYCH:  Appropriate speech and behavior  Diagnostic and Interventional Summary    Labs Reviewed  COMPREHENSIVE METABOLIC PANEL - Abnormal; Notable for the following components:      Result Value   Glucose, Bld 109 (*)    Total Protein 6.4 (*)    All other components within normal limits  CBC - Abnormal; Notable for the following components:   RBC 2.62 (*)    Hemoglobin 5.5 (*)    HCT 20.4 (*)  MCV 77.9 (*)    MCH 21.0 (*)    MCHC 27.0 (*)    RDW 21.2 (*)    nRBC 0.3 (*)    All other components within normal limits  POC OCCULT BLOOD, ED  TYPE AND SCREEN    No orders to display    Medications - No data to display   Procedures Critical Care Critical Care Documentation Critical care time provided by me (excluding procedures): 35 minutes  Condition necessitating critical care: Symptomatic anemia requiring blood transfusion  Components of critical care management: reviewing of prior records, laboratory and imaging interpretation, frequent re-examination and reassessment of vital signs, administration of packed red blood cells, discussion with consulting services.    ED Course and Medical Decision Making  I have reviewed the triage vital signs and the nursing notes.  Pertinent labs & imaging results that were available during my care of the patient were reviewed by me and considered in my medical decision making (see below for details).  Symptomatic anemia, presumed recurrence of GI bleeding, more specifically AVMs of the small bowel.  Patient prefers admission at this hospital rather than [facility], will transfuse and admit.   Barth Kirks. [place] Small, [place] [email or web address]  Final Clinical Impressions(s) / ED Diagnoses     ICD-10-CM   1. Symptomatic anemia  D64.9     ED Discharge Orders    None         Maudie Flakes, MD 05/19/19 2020

## 2019-05-20 DIAGNOSIS — G4733 Obstructive sleep apnea (adult) (pediatric): Secondary | ICD-10-CM | POA: Diagnosis not present

## 2019-05-20 DIAGNOSIS — Z79899 Other long term (current) drug therapy: Secondary | ICD-10-CM | POA: Diagnosis not present

## 2019-05-20 DIAGNOSIS — I10 Essential (primary) hypertension: Secondary | ICD-10-CM | POA: Diagnosis not present

## 2019-05-20 DIAGNOSIS — F419 Anxiety disorder, unspecified: Secondary | ICD-10-CM | POA: Diagnosis not present

## 2019-05-20 DIAGNOSIS — D500 Iron deficiency anemia secondary to blood loss (chronic): Secondary | ICD-10-CM

## 2019-05-20 DIAGNOSIS — Z96642 Presence of left artificial hip joint: Secondary | ICD-10-CM | POA: Diagnosis not present

## 2019-05-20 DIAGNOSIS — D649 Anemia, unspecified: Secondary | ICD-10-CM | POA: Diagnosis not present

## 2019-05-20 DIAGNOSIS — Q2739 Arteriovenous malformation, other site: Secondary | ICD-10-CM | POA: Diagnosis not present

## 2019-05-20 DIAGNOSIS — F329 Major depressive disorder, single episode, unspecified: Secondary | ICD-10-CM | POA: Diagnosis not present

## 2019-05-20 DIAGNOSIS — Z1159 Encounter for screening for other viral diseases: Secondary | ICD-10-CM | POA: Diagnosis not present

## 2019-05-20 DIAGNOSIS — K219 Gastro-esophageal reflux disease without esophagitis: Secondary | ICD-10-CM | POA: Diagnosis not present

## 2019-05-20 DIAGNOSIS — M1612 Unilateral primary osteoarthritis, left hip: Secondary | ICD-10-CM | POA: Diagnosis not present

## 2019-05-20 DIAGNOSIS — E785 Hyperlipidemia, unspecified: Secondary | ICD-10-CM | POA: Diagnosis not present

## 2019-05-20 DIAGNOSIS — D509 Iron deficiency anemia, unspecified: Secondary | ICD-10-CM | POA: Diagnosis not present

## 2019-05-20 LAB — CBC
HCT: 32.1 % — ABNORMAL LOW (ref 36.0–46.0)
Hemoglobin: 9.4 g/dL — ABNORMAL LOW (ref 12.0–15.0)
MCH: 23.7 pg — ABNORMAL LOW (ref 26.0–34.0)
MCHC: 29.3 g/dL — ABNORMAL LOW (ref 30.0–36.0)
MCV: 80.9 fL (ref 80.0–100.0)
Platelets: 251 10*3/uL (ref 150–400)
RBC: 3.97 MIL/uL (ref 3.87–5.11)
RDW: 19 % — ABNORMAL HIGH (ref 11.5–15.5)
WBC: 5.7 10*3/uL (ref 4.0–10.5)
nRBC: 0 % (ref 0.0–0.2)

## 2019-05-20 LAB — RETICULOCYTES
Immature Retic Fract: 23.5 % — ABNORMAL HIGH (ref 2.3–15.9)
RBC.: 3.19 MIL/uL — ABNORMAL LOW (ref 3.87–5.11)
Retic Count, Absolute: 63.2 10*3/uL (ref 19.0–186.0)
Retic Ct Pct: 2 % (ref 0.4–3.1)

## 2019-05-20 LAB — BASIC METABOLIC PANEL
Anion gap: 7 (ref 5–15)
BUN: 19 mg/dL (ref 8–23)
CO2: 23 mmol/L (ref 22–32)
Calcium: 9.1 mg/dL (ref 8.9–10.3)
Chloride: 111 mmol/L (ref 98–111)
Creatinine, Ser: 0.69 mg/dL (ref 0.44–1.00)
GFR calc Af Amer: >60 mL/min (ref >60)
GFR calc non Af Amer: >60 mL/min (ref >60)
Glucose, Bld: 95 mg/dL (ref 70–99)
Potassium: 3.9 mmol/L (ref 3.5–5.1)
Sodium: 141 mmol/L (ref 135–145)

## 2019-05-20 LAB — FOLATE: Folate: 36.4 ng/mL (ref >5.9)

## 2019-05-20 LAB — IRON AND TIBC
Iron: 19 ug/dL — ABNORMAL LOW (ref 28–170)
Saturation Ratios: 4 % — ABNORMAL LOW (ref 10.4–31.8)
TIBC: 475 ug/dL — ABNORMAL HIGH (ref 250–450)
UIBC: 456 ug/dL

## 2019-05-20 LAB — GLUCOSE, CAPILLARY: Glucose-Capillary: 95 mg/dL (ref 70–99)

## 2019-05-20 LAB — VITAMIN B12: Vitamin B-12: 273 pg/mL (ref 180–914)

## 2019-05-20 LAB — PREPARE RBC (CROSSMATCH)

## 2019-05-20 LAB — FERRITIN: Ferritin: 3 ng/mL — ABNORMAL LOW (ref 11–307)

## 2019-05-20 MED ORDER — SODIUM CHLORIDE 0.9% IV SOLUTION
Freq: Once | INTRAVENOUS | Status: AC
  Administered 2019-05-20: 10:00:00 via INTRAVENOUS

## 2019-05-20 NOTE — [place] Notes (Signed)
PHARMACIST - PHYSICIAN ORDER COMMUNICATION  CONCERNING: P&T Medication Policy on Herbal Medications  DESCRIPTION:  This patient's order for:  Cinnamon, turmeric and garlic  has been noted.  This product(s) is classified as an "herbal" or natural product. Due to a lack of definitive safety studies or FDA approval, nonstandard manufacturing practices, plus the potential risk of unknown drug-drug interactions while on inpatient medications, the Pharmacy and Therapeutics Committee does not permit the use of "herbal" or natural products of this type within [facility].   ACTION TAKEN: The pharmacy department is unable to verify this order at this time and your patient has been informed of this safety policy. Please reevaluate patient's clinical condition at discharge and address if the herbal or natural product(s) should be resumed at that time.   Thanks Dorrene German 05/20/2019 12:01 AM

## 2019-05-20 NOTE — Discharge Instructions (Signed)

## 2019-05-20 NOTE — [place] Notes (Signed)
D/cing to home w/ family. All d/c instructions given w/ verbal understanding. Voices no c/o and  NAD noted.

## 2019-05-20 NOTE — Discharge Summary (Signed)
Physician Discharge Summary  Molly Lucas [facility] QAS:[id number] DOB: 03/30/1946 DOA: 05/19/2019  PCP: [name], MD GI: Allyn Kenner   Admit date: 05/19/2019 Discharge date: 05/20/2019  Admitted From: Home  Disposition: Home   Recommendations for Outpatient Follow-up:  1. Follow up with GI in 1 weeks (Dr.Medhoff) 2. Follow up with PCP in 2 weeks 3. Please obtain CBC in one week to follow up hemoglobin  Discharge Condition: STABLE   CODE STATUS: FULL    Brief Hospitalization Summary: Please see all hospital notes, images, labs for full details of the hospitalization. HPI: Molly Lucas is a 73 y.o. female with medical history significant of hyperlipidemia, OSA not on CPAP, iron deficiency anemia, GERD, depression, anxiety, GI bleeding, AVM, gastric ulcer, who presents with shortness breath and lightheadedness.  Patient states that she has been having mild shortness of breath on exertion, and lightheadedness in the past several days.  She went to PCPs office this AM, and was found to have hemoglobin 6.0.  Patient denies any nausea, vomiting, diarrhea or abdominal pain.  Denies any dark stool or rectal bleeding.  Patient does not have chest pain, cough, fever or chills.  No symptoms of UTI or unilateral weakness.  Patient states that her GI doctor, Dr. Earlean Shawl in [facility] told her that she should not take Pepcid and Protonix. She is not taking iron supplement.  She states if her iron is low, she usually needs IV iron infusion. She states that she had follow-up at [facility] for a pill swallow study, which revealed AVMs of the small bowel. Of note, [facility], and had negative coloscopy and endoscopy by Dr. Tarri Glenn.  ED Course: pt was found to have hemoglobin 5.5 (7.9 on 03/31/2019), pending FOBT, pending COVID-19 test, electrolytes renal function okay, temperature normal heart rate 86, oxygen saturation 96% on room air, blood pressure 129/85.  Patient is  placed on telemetry bed for observation.  The patient was admitted to the hospital for observation.  The patient had a symptomatic anemia with a hemoglobin of 5.5.  She has had extensive work-up for her GI bleeding.  She is followed closely by her GI Dr. Earlean Shawl.  The patient was transfused 3 units of packed red blood cells for her symptomatic anemia and her hemoglobin has improved to 9.4.  I discussed with GI on call who reviewed her case and they said that there was no further work-up that needed to be done in the hospital.  They said to stabilize her improve her hemoglobin and have her to follow-up with her primary GI doctor made off.  I discussed this with the patient and she was agreeable.  She will follow-up with Dr. Earlean Shawl in next week for recheck and for CBC retesting.   After the transfusion the patient was feeling much better and wanted to go home.  She is stable to discharge home today with outpatient follow-up as noted.  Discharge Diagnoses:  Principal Problem:   Symptomatic anemia Active Problems:   HLD (hyperlipidemia)   Iron deficiency anemia   GERD (gastroesophageal reflux disease)   Depression   Discharge Instructions: Discharge Instructions    Call MD for:  difficulty breathing, headache or visual disturbances   Complete by: As directed    Call MD for:  extreme fatigue   Complete by: As directed    Call MD for:  persistant dizziness or light-headedness   Complete by: As directed    Call MD for:  persistant nausea and vomiting   Complete by: As directed    Call MD for:  severe uncontrolled pain   Complete by: As directed    Increase activity slowly   Complete by: As directed      Allergies as of 05/20/2019      Reactions   Penicillins Swelling, Other (See Comments)   Tongue became swollen and turned black Did it involve swelling of the face/tongue/throat, SOB, or low BP? Yes Did it involve sudden or severe rash/hives, skin peeling, or any reaction on the inside of  your mouth or nose? Yes Did you need to seek medical attention at a hospital or doctor's office? Yes When did it last happen? "I was a child" If all above answers are "NO", may proceed with cephalosporin use.      Medication List    STOP taking these medications   famotidine 20 MG tablet Commonly known as: PEPCID   pantoprazole 40 MG tablet Commonly known as: Protonix     TAKE these medications   CALCIUM PO Take 2 tablets by mouth daily.   cetirizine 10 MG tablet Commonly known as: ZYRTEC Take 10 mg by mouth daily.   Cinnamon 500 MG capsule Take 500 mg by mouth daily.   DULoxetine 30 MG capsule Commonly known as: CYMBALTA Take 60 mg by mouth at bedtime.   Garlic 9983 MG Caps Take 2,000 mg by mouth daily.   Hair/Skin/Nails Tabs Take 1 tablet by mouth daily.   multivitamin-lutein Caps capsule Take 1 capsule by mouth daily.   NON FORMULARY Take 1 capsule by mouth See admin instructions. Black Cumiun oil capsules: Take 1 capsule by mouth once a day   Omega-3 1000 MG Caps Take 2,000 mg by mouth daily.   pravastatin 20 MG tablet Commonly known as: PRAVACHOL Take 20 mg by mouth at bedtime.   PROBIOTIC DAILY PO Take 1 capsule by mouth daily.   Silenor 6 MG Tabs Generic drug: Doxepin HCl Take 6 mg by mouth at bedtime.   Turmeric 500 MG Caps Take 500 mg by mouth daily.   Vicks VapoRub 3.8-2.5-0.5 % Oint 1 application See admin instructions. Apply under the nostrils at bedtime   vitamin C 500 MG tablet Commonly known as: ASCORBIC ACID Take 500 mg by mouth daily.   Vitamin D3 250 MCG (10000 UT) Tabs Take 10,000 Units by mouth daily.      Follow-up Information    Medoff, Dellis Filbert, MD. Schedule an appointment as soon as possible for a visit in 1 week(s).   Specialty: Gastroenterology Why: Hospital Follow Up  Contact information: [address] [place] [address] [phone]          Allergies  Allergen Reactions  . Penicillins Swelling  and Other (See Comments)    Tongue became swollen and turned black Did it involve swelling of the face/tongue/throat, SOB, or low BP? Yes Did it involve sudden or severe rash/hives, skin peeling, or any reaction on the inside of your mouth or nose? Yes Did you need to seek medical attention at a hospital or doctor's office? Yes When did it last happen? "I was a child" If all above answers are "NO", may proceed with cephalosporin use.    Allergies as of 05/20/2019      Reactions   Penicillins Swelling, Other (See Comments)   Tongue became swollen and turned black Did it involve swelling of the face/tongue/throat, SOB, or low BP? Yes Did it involve sudden or severe rash/hives, skin  peeling, or any reaction on the inside of your mouth or nose? Yes Did you need to seek medical attention at a hospital or doctor's office? Yes When did it last happen? "I was a child" If all above answers are "NO", may proceed with cephalosporin use.      Medication List    STOP taking these medications   famotidine 20 MG tablet Commonly known as: PEPCID   pantoprazole 40 MG tablet Commonly known as: Protonix     TAKE these medications   CALCIUM PO Take 2 tablets by mouth daily.   cetirizine 10 MG tablet Commonly known as: ZYRTEC Take 10 mg by mouth daily.   Cinnamon 500 MG capsule Take 500 mg by mouth daily.   DULoxetine 30 MG capsule Commonly known as: CYMBALTA Take 60 mg by mouth at bedtime.   Garlic 1540 MG Caps Take 2,000 mg by mouth daily.   Hair/Skin/Nails Tabs Take 1 tablet by mouth daily.   multivitamin-lutein Caps capsule Take 1 capsule by mouth daily.   NON FORMULARY Take 1 capsule by mouth See admin instructions. Black Cumiun oil capsules: Take 1 capsule by mouth once a day   Omega-3 1000 MG Caps Take 2,000 mg by mouth daily.   pravastatin 20 MG tablet Commonly known as: PRAVACHOL Take 20 mg by mouth at bedtime.   PROBIOTIC DAILY PO Take 1 capsule by mouth  daily.   Silenor 6 MG Tabs Generic drug: Doxepin HCl Take 6 mg by mouth at bedtime.   Turmeric 500 MG Caps Take 500 mg by mouth daily.   Vicks VapoRub 0.8-6.7-6.1 % Oint 1 application See admin instructions. Apply under the nostrils at bedtime   vitamin C 500 MG tablet Commonly known as: ASCORBIC ACID Take 500 mg by mouth daily.   Vitamin D3 250 MCG (10000 UT) Tabs Take 10,000 Units by mouth daily.       Procedures/Studies: Dg Bone Density (dxa)  Result Date: 05/18/2019 EXAM: DUAL X-RAY ABSORPTIOMETRY (DXA) FOR BONE MINERAL DENSITY IMPRESSION: Referring Physician:  MODY, [place] Your patient completed a BMD test using Lunar IDXA DXA system ( analysis version: 16 ) manufactured by [employer]. Technologist: KT PATIENT: Name: AAIRA, OESTREICHER Patient ID: [id number] Birth Date: 1946-06-17 Height: 61.5 in. Sex: Female Measured: 05/18/2019 Weight: 201.8 lbs. Indications: Advanced Age, Caucasian, cymbalta, Depression, Estrogen Deficient, Family History of Osteoporosis, Height Loss (781.91), Left hip replaced, Postmenopausal Fractures: None Treatments: Calcium (E943.0), Vitamin D (E933.5) ASSESSMENT: The BMD measured at Forearm Radius 33% is 0.569 g/cm2 with a T-score of -3.6. This patient's diagnostic category is OSTEOPOROSIS according to [place] [facility]) criteria. There has been a statistically significant decrease in BMD of right hip since prior exam dated 06/14/2012. The scan quality is good. Left femur was excluded due to surgical hardware. Lumbar spine was not utilized due to scoliosis. Site Region Measured Date Measured Age YA BMD Significant CHANGE T-score Left Forearm Radius 33% 05/18/2019 74.7 -3.6 0.569 g/cm2 Right Femur Neck 05/18/2019 74.7 -1.1 0.883 g/cm2 * Right Femur Neck 06/14/2012 67.7 -0.5 0.975 g/cm2 World Health Organization [facility]) criteria for post-menopausal, Caucasian Women: Normal       T-score at or above -1 SD Osteopenia   T-score between -1 and -2.5 SD  Osteoporosis T-score at or below -2.5 SD RECOMMENDATION: 1. All patients should optimize calcium and vitamin D intake. 2. Consider FDA approved medical therapies in postmenopausal women and men aged 61 years and older, based on the following: a. A hip or vertebral (  clinical or morphometric) fracture b. T- score < or = -2.5 at the femoral neck or spine after appropriate evaluation to exclude secondary causes c. Low bone mass (T-score between -1.0 and -2.5 at the femoral neck or spine) and a 10 year probability of a hip fracture > or = 3% or a 10 year probability of a major osteoporosis-related fracture > or = 20% based on the [place]-adapted WHO algorithm d. Clinician judgment and/or patient preferences may indicate treatment for people with 10-year fracture probabilities above or below these levels FOLLOW-UP: Patients with diagnosis of osteoporosis or at high risk for fracture should have regular bone mineral density tests. For patients eligible for Medicare, routine testing is allowed once every 2 years. The testing frequency can be increased to one year for patients who have rapidly progressing disease, those who are receiving or discontinuing medical therapy to restore bone mass, or have additional risk factors. I have reviewed this report and agree with the above findings. [facility] Electronically Signed   By: Margarette Canada M.D.   On: 05/18/2019 12:02      Subjective: Patient feels much better after transfusion.  Her hemoglobin is up to 9.4.  She has had no black stools.  She has had no hematemesis.  Discharge Exam: Vitals:   05/20/19 1033 05/20/19 1242  BP: 121/70 129/77  Pulse: 80 72  Resp: 15 18  Temp: 98.4 F (36.9 C) 98.4 F (36.9 C)  SpO2: 97%    Vitals:   05/20/19 0637 05/20/19 1016 05/20/19 1033 05/20/19 1242  BP: 123/82 137/76 121/70 129/77  Pulse: 79 98 80 72  Resp: 12 15 15 18   Temp: 98.6 F (37 C) 97.8 F (36.6 C) 98.4 F (36.9 C) 98.4 F (36.9 C)  TempSrc: Oral Oral  Oral Oral  SpO2: 99% 99% 97%     General: Pt is alert, awake, not in acute distress Cardiovascular: RRR, S1/S2 +, no rubs, no gallops Respiratory: CTA bilaterally, no wheezing, no rhonchi Abdominal: Soft, NT, ND, bowel sounds + Extremities: no edema, no cyanosis   The results of significant diagnostics from this hospitalization (including imaging, microbiology, ancillary and laboratory) are listed below for reference.     Microbiology: Recent Results (from the past 240 hour(s))  SARS Coronavirus 2 (CEPHEID - Performed in [place] hospital lab), Hosp Order     Status: None   Collection Time: 05/19/19  8:22 PM   Specimen: Nasopharyngeal Swab  Result Value Ref Range Status   SARS Coronavirus 2 NEGATIVE NEGATIVE Final    Comment: (NOTE) If result is NEGATIVE SARS-CoV-2 target nucleic acids are NOT DETECTED. The SARS-CoV-2 RNA is generally detectable in upper and lower  respiratory specimens during the acute phase of infection. The lowest  concentration of SARS-CoV-2 viral copies this assay can detect is 250  copies / mL. A negative result does not preclude SARS-CoV-2 infection  and should not be used as the sole basis for treatment or other  patient management decisions.  A negative result may occur with  improper specimen collection / handling, submission of specimen other  than nasopharyngeal swab, presence of viral mutation(s) within the  areas targeted by this assay, and inadequate number of viral copies  (<250 copies / mL). A negative result must be combined with clinical  observations, patient history, and epidemiological information. If result is POSITIVE SARS-CoV-2 target nucleic acids are DETECTED. The SARS-CoV-2 RNA is generally detectable in upper and lower  respiratory specimens dur ing the acute phase of  infection.  Positive  results are indicative of active infection with SARS-CoV-2.  Clinical  correlation with patient history and other diagnostic information is   necessary to determine patient infection status.  Positive results do  not rule out bacterial infection or co-infection with other viruses. If result is PRESUMPTIVE POSTIVE SARS-CoV-2 nucleic acids MAY BE PRESENT.   A presumptive positive result was obtained on the submitted specimen  and confirmed on repeat testing.  While 2019 novel coronavirus  (SARS-CoV-2) nucleic acids may be present in the submitted sample  additional confirmatory testing may be necessary for epidemiological  and / or clinical management purposes  to differentiate between  SARS-CoV-2 and other Sarbecovirus currently known to infect humans.  If clinically indicated additional testing with an alternate test  methodology [phone]) is advised. The SARS-CoV-2 RNA is generally  detectable in upper and lower respiratory sp ecimens during the acute  phase of infection. The expected result is Negative. Fact Sheet for Patients:  StrictlyIdeas.no Fact Sheet for Healthcare Providers: BankingDealers.co.za This test is not yet approved or cleared by the [place] FDA and has been authorized for detection and/or diagnosis of SARS-CoV-2 by FDA under an Emergency Use Authorization (EUA).  This EUA will remain in effect (meaning this test can be used) for the duration of the COVID-19 declaration under Section 564(b)(1) of the Act, 21 U.S.C. section 360bbb-3(b)(1), unless the authorization is terminated or revoked sooner. Performed at [facility], [place] [address], [place], [place] [address]      Labs: BNP (last 3 results) No results for input(s): BNP in the last 8760 hours. Basic Metabolic Panel: Recent Labs  Lab 05/19/19 1908 05/20/19 0810  NA 136 141  K 4.1 3.9  CL 104 111  CO2 22 23  GLUCOSE 109* 95  BUN 22 19  CREATININE 0.87 0.69  CALCIUM 10.0 9.1   Liver Function Tests: Recent Labs  Lab 05/19/19 1908  AST 20  ALT 19  ALKPHOS 82   BILITOT 0.9  PROT 6.4*  ALBUMIN 3.8   No results for input(s): LIPASE, AMYLASE in the last 168 hours. No results for input(s): AMMONIA in the last 168 hours. CBC: Recent Labs  Lab 05/19/19 1908 05/20/19 1342  WBC 6.4 5.7  HGB 5.5* 9.4*  HCT 20.4* 32.1*  MCV 77.9* 80.9  PLT 287 251   Cardiac Enzymes: No results for input(s): CKTOTAL, CKMB, CKMBINDEX, TROPONINI in the last 168 hours. BNP: Invalid input(s): POCBNP CBG: Recent Labs  Lab 05/20/19 0827  GLUCAP 95   D-Dimer No results for input(s): DDIMER in the last 72 hours. Hgb A1c No results for input(s): HGBA1C in the last 72 hours. Lipid Profile No results for input(s): CHOL, HDL, LDLCALC, TRIG, CHOLHDL, LDLDIRECT in the last 72 hours. Thyroid function studies No results for input(s): TSH, T4TOTAL, T3FREE, THYROIDAB in the last 72 hours.  Invalid input(s): FREET3 Anemia work up Recent Labs    05/20/19 0810  VITAMINB12 273  FOLATE 36.4  FERRITIN 3*  TIBC 475*  IRON 19*  RETICCTPCT 2.0   Urinalysis    Component Value Date/Time   COLORURINE YELLOW 06/16/2012 1025   APPEARANCEUR CLOUDY (A) 06/16/2012 1025   LABSPEC 1.025 06/16/2012 1025   PHURINE 5.5 06/16/2012 [place] 06/16/2012 1025   [place] 06/16/2012 [place] 06/16/2012 1025   [place] 06/16/2012 1025   PROTEINUR NEGATIVE 06/16/2012 1025   UROBILINOGEN 0.2 06/16/2012 1025   NITRITE NEGATIVE 06/16/2012 1025  LEUKOCYTESUR MODERATE (A) 06/16/2012 1025   Sepsis Labs Invalid input(s): PROCALCITONIN,  WBC,  LACTICIDVEN Microbiology Recent Results (from the past 240 hour(s))  SARS Coronavirus 2 (CEPHEID - Performed in [place] hospital lab), Hosp Order     Status: None   Collection Time: 05/19/19  8:22 PM   Specimen: Nasopharyngeal Swab  Result Value Ref Range Status   SARS Coronavirus 2 NEGATIVE NEGATIVE Final    Comment: (NOTE) If result is NEGATIVE SARS-CoV-2 target nucleic acids are NOT  DETECTED. The SARS-CoV-2 RNA is generally detectable in upper and lower  respiratory specimens during the acute phase of infection. The lowest  concentration of SARS-CoV-2 viral copies this assay can detect is 250  copies / mL. A negative result does not preclude SARS-CoV-2 infection  and should not be used as the sole basis for treatment or other  patient management decisions.  A negative result may occur with  improper specimen collection / handling, submission of specimen other  than nasopharyngeal swab, presence of viral mutation(s) within the  areas targeted by this assay, and inadequate number of viral copies  (<250 copies / mL). A negative result must be combined with clinical  observations, patient history, and epidemiological information. If result is POSITIVE SARS-CoV-2 target nucleic acids are DETECTED. The SARS-CoV-2 RNA is generally detectable in upper and lower  respiratory specimens dur ing the acute phase of infection.  Positive  results are indicative of active infection with SARS-CoV-2.  Clinical  correlation with patient history and other diagnostic information is  necessary to determine patient infection status.  Positive results do  not rule out bacterial infection or co-infection with other viruses. If result is PRESUMPTIVE POSTIVE SARS-CoV-2 nucleic acids MAY BE PRESENT.   A presumptive positive result was obtained on the submitted specimen  and confirmed on repeat testing.  While 2019 novel coronavirus  (SARS-CoV-2) nucleic acids may be present in the submitted sample  additional confirmatory testing may be necessary for epidemiological  and / or clinical management purposes  to differentiate between  SARS-CoV-2 and other Sarbecovirus currently known to infect humans.  If clinically indicated additional testing with an alternate test  methodology [phone]) is advised. The SARS-CoV-2 RNA is generally  detectable in upper and lower respiratory sp ecimens during  the acute  phase of infection. The expected result is Negative. Fact Sheet for Patients:  StrictlyIdeas.no Fact Sheet for Healthcare Providers: BankingDealers.co.za This test is not yet approved or cleared by the [place] FDA and has been authorized for detection and/or diagnosis of SARS-CoV-2 by FDA under an Emergency Use Authorization (EUA).  This EUA will remain in effect (meaning this test can be used) for the duration of the COVID-19 declaration under Section 564(b)(1) of the Act, 21 U.S.C. section 360bbb-3(b)(1), unless the authorization is terminated or revoked sooner. Performed at [facility], [place] [address], [place], [place] [address]    Time coordinating discharge:   SIGNED:  Irwin Brakeman, MD  [facility] 05/20/2019, 2:09 PM How to contact the [facility] Attending or Consulting provider [place] or covering provider during after hours [place], for this patient?  1. Check the care team in [facility] and look for a) attending/consulting [facility] provider listed and b) the [facility] team listed 2. Log into [email or web address] and use [place] universal password to access. If you do not have the password, please contact the hospital operator. 3. Locate the [facility] provider you are looking for under [facility] and page to a  number that you can be directly reached. 4. If you still have difficulty reaching the provider, please page the [facility] (Director on Call) for the Hospitalists listed on amion for assistance.

## 2019-05-21 LAB — TYPE AND SCREEN
ABO/RH(D): O POS
Antibody Screen: NEGATIVE
Unit division: 0
Unit division: 0
Unit division: 0

## 2019-05-21 LAB — BPAM RBC
Blood Product Expiration Date: 202008112359
Blood Product Expiration Date: 202008112359
Blood Product Expiration Date: 202008112359
ISSUE DATE / TIME: 202007110033
ISSUE DATE / TIME: 202007110355
ISSUE DATE / TIME: 202007111009
Unit Type and Rh: 5100
Unit Type and Rh: 5100
Unit Type and Rh: 5100

## 2019-05-26 DIAGNOSIS — K922 Gastrointestinal hemorrhage, unspecified: Secondary | ICD-10-CM | POA: Diagnosis not present

## 2019-05-26 DIAGNOSIS — D500 Iron deficiency anemia secondary to blood loss (chronic): Secondary | ICD-10-CM | POA: Diagnosis not present

## 2019-05-26 DIAGNOSIS — K5520 Angiodysplasia of colon without hemorrhage: Secondary | ICD-10-CM | POA: Diagnosis not present

## 2019-06-01 DIAGNOSIS — F411 Generalized anxiety disorder: Secondary | ICD-10-CM | POA: Diagnosis not present

## 2019-06-08 DIAGNOSIS — K449 Diaphragmatic hernia without obstruction or gangrene: Secondary | ICD-10-CM | POA: Diagnosis not present

## 2019-06-08 DIAGNOSIS — M810 Age-related osteoporosis without current pathological fracture: Secondary | ICD-10-CM | POA: Diagnosis not present

## 2019-06-09 ENCOUNTER — Other Ambulatory Visit (HOSPITAL_COMMUNITY): Payer: Self-pay | Admitting: *Deleted

## 2019-06-12 ENCOUNTER — Ambulatory Visit (HOSPITAL_COMMUNITY)
Admission: RE | Admit: 2019-06-12 | Discharge: 2019-06-12 | Disposition: A | Discharge status: Home / Self Care | Payer: Medicare Other | Source: Ambulatory Visit | Attending: Internal Medicine | Admitting: Internal Medicine

## 2019-06-12 ENCOUNTER — Other Ambulatory Visit: Payer: Self-pay

## 2019-06-12 DIAGNOSIS — D509 Iron deficiency anemia, unspecified: Secondary | ICD-10-CM | POA: Insufficient documentation

## 2019-06-12 MED ORDER — SODIUM CHLORIDE 0.9 % IV SOLN
510.0000 mg | INTRAVENOUS | Status: DC
  Administered 2019-06-12: 09:00:00 510 mg via INTRAVENOUS
  Filled 2019-06-12: qty 17

## 2019-06-13 DIAGNOSIS — Z9989 Dependence on other enabling machines and devices: Secondary | ICD-10-CM | POA: Diagnosis not present

## 2019-06-13 DIAGNOSIS — D62 Acute posthemorrhagic anemia: Secondary | ICD-10-CM | POA: Diagnosis not present

## 2019-06-13 DIAGNOSIS — F329 Major depressive disorder, single episode, unspecified: Secondary | ICD-10-CM | POA: Diagnosis not present

## 2019-06-13 DIAGNOSIS — E785 Hyperlipidemia, unspecified: Secondary | ICD-10-CM | POA: Diagnosis not present

## 2019-06-13 DIAGNOSIS — G4733 Obstructive sleep apnea (adult) (pediatric): Secondary | ICD-10-CM | POA: Diagnosis not present

## 2019-06-13 DIAGNOSIS — W19XXXA Unspecified fall, initial encounter: Secondary | ICD-10-CM | POA: Diagnosis not present

## 2019-06-13 DIAGNOSIS — R9431 Abnormal electrocardiogram [ECG] [EKG]: Secondary | ICD-10-CM | POA: Diagnosis not present

## 2019-06-13 DIAGNOSIS — D509 Iron deficiency anemia, unspecified: Secondary | ICD-10-CM | POA: Diagnosis not present

## 2019-06-13 DIAGNOSIS — K5520 Angiodysplasia of colon without hemorrhage: Secondary | ICD-10-CM | POA: Diagnosis not present

## 2019-06-13 DIAGNOSIS — F419 Anxiety disorder, unspecified: Secondary | ICD-10-CM | POA: Diagnosis present

## 2019-06-13 DIAGNOSIS — Z20828 Contact with and (suspected) exposure to other viral communicable diseases: Secondary | ICD-10-CM | POA: Diagnosis present

## 2019-06-13 DIAGNOSIS — Q2733 Arteriovenous malformation of digestive system vessel: Secondary | ICD-10-CM | POA: Diagnosis not present

## 2019-06-14 DIAGNOSIS — Z9989 Dependence on other enabling machines and devices: Secondary | ICD-10-CM | POA: Diagnosis not present

## 2019-06-14 DIAGNOSIS — Q2733 Arteriovenous malformation of digestive system vessel: Secondary | ICD-10-CM | POA: Diagnosis not present

## 2019-06-14 DIAGNOSIS — E785 Hyperlipidemia, unspecified: Secondary | ICD-10-CM | POA: Diagnosis not present

## 2019-06-14 DIAGNOSIS — F419 Anxiety disorder, unspecified: Secondary | ICD-10-CM | POA: Diagnosis not present

## 2019-06-14 DIAGNOSIS — F329 Major depressive disorder, single episode, unspecified: Secondary | ICD-10-CM | POA: Diagnosis not present

## 2019-06-14 DIAGNOSIS — D62 Acute posthemorrhagic anemia: Secondary | ICD-10-CM | POA: Diagnosis not present

## 2019-06-14 DIAGNOSIS — W19XXXA Unspecified fall, initial encounter: Secondary | ICD-10-CM | POA: Diagnosis not present

## 2019-06-14 DIAGNOSIS — G4733 Obstructive sleep apnea (adult) (pediatric): Secondary | ICD-10-CM | POA: Diagnosis not present

## 2019-06-16 DIAGNOSIS — K449 Diaphragmatic hernia without obstruction or gangrene: Secondary | ICD-10-CM | POA: Diagnosis not present

## 2019-06-16 DIAGNOSIS — D500 Iron deficiency anemia secondary to blood loss (chronic): Secondary | ICD-10-CM | POA: Diagnosis not present

## 2019-06-16 DIAGNOSIS — R1310 Dysphagia, unspecified: Secondary | ICD-10-CM | POA: Diagnosis not present

## 2019-06-16 DIAGNOSIS — K5520 Angiodysplasia of colon without hemorrhage: Secondary | ICD-10-CM | POA: Diagnosis not present

## 2019-06-16 DIAGNOSIS — Z8719 Personal history of other diseases of the digestive system: Secondary | ICD-10-CM | POA: Diagnosis not present

## 2019-06-16 DIAGNOSIS — D509 Iron deficiency anemia, unspecified: Secondary | ICD-10-CM | POA: Diagnosis not present

## 2019-06-16 DIAGNOSIS — K222 Esophageal obstruction: Secondary | ICD-10-CM | POA: Diagnosis not present

## 2019-06-19 ENCOUNTER — Encounter (HOSPITAL_COMMUNITY)
Admission: RE | Admit: 2019-06-19 | Discharge: 2019-06-19 | Disposition: A | Discharge status: Home / Self Care | Payer: Medicare Other | Source: Ambulatory Visit | Attending: Internal Medicine | Admitting: Internal Medicine

## 2019-06-19 ENCOUNTER — Other Ambulatory Visit: Payer: Self-pay

## 2019-06-19 DIAGNOSIS — D509 Iron deficiency anemia, unspecified: Secondary | ICD-10-CM | POA: Insufficient documentation

## 2019-06-19 MED ORDER — SODIUM CHLORIDE 0.9 % IV SOLN
510.0000 mg | INTRAVENOUS | Status: DC
  Administered 2019-06-19: 510 mg via INTRAVENOUS
  Filled 2019-06-19: qty 17

## 2019-06-26 DIAGNOSIS — F411 Generalized anxiety disorder: Secondary | ICD-10-CM | POA: Diagnosis not present

## 2019-07-10 DIAGNOSIS — F411 Generalized anxiety disorder: Secondary | ICD-10-CM | POA: Diagnosis not present

## 2019-07-11 DIAGNOSIS — D500 Iron deficiency anemia secondary to blood loss (chronic): Secondary | ICD-10-CM | POA: Diagnosis not present

## 2019-07-19 ENCOUNTER — Other Ambulatory Visit: Payer: Self-pay | Admitting: Obstetrics & Gynecology

## 2019-07-20 ENCOUNTER — Other Ambulatory Visit: Payer: Self-pay | Admitting: Obstetrics and Gynecology

## 2019-07-26 DIAGNOSIS — K5520 Angiodysplasia of colon without hemorrhage: Secondary | ICD-10-CM | POA: Diagnosis not present

## 2019-07-26 DIAGNOSIS — D500 Iron deficiency anemia secondary to blood loss (chronic): Secondary | ICD-10-CM | POA: Diagnosis not present

## 2019-07-27 ENCOUNTER — Other Ambulatory Visit: Payer: Self-pay

## 2019-07-27 ENCOUNTER — Inpatient Hospital Stay (HOSPITAL_COMMUNITY)
Admission: EM | Admit: 2019-07-27 | Discharge: 2019-07-29 | DRG: 811 | Disposition: A | Discharge status: Home / Self Care | Payer: Medicare Other | Source: Ambulatory Visit | Attending: Internal Medicine | Admitting: Internal Medicine

## 2019-07-27 ENCOUNTER — Emergency Department (HOSPITAL_COMMUNITY): Payer: Medicare Other

## 2019-07-27 ENCOUNTER — Encounter (HOSPITAL_COMMUNITY): Payer: Self-pay | Admitting: Emergency Medicine

## 2019-07-27 DIAGNOSIS — Z20828 Contact with and (suspected) exposure to other viral communicable diseases: Secondary | ICD-10-CM | POA: Diagnosis present

## 2019-07-27 DIAGNOSIS — E785 Hyperlipidemia, unspecified: Secondary | ICD-10-CM | POA: Diagnosis not present

## 2019-07-27 DIAGNOSIS — G4733 Obstructive sleep apnea (adult) (pediatric): Secondary | ICD-10-CM | POA: Diagnosis present

## 2019-07-27 DIAGNOSIS — D649 Anemia, unspecified: Secondary | ICD-10-CM | POA: Diagnosis not present

## 2019-07-27 DIAGNOSIS — I10 Essential (primary) hypertension: Secondary | ICD-10-CM | POA: Diagnosis not present

## 2019-07-27 DIAGNOSIS — K219 Gastro-esophageal reflux disease without esophagitis: Secondary | ICD-10-CM | POA: Diagnosis present

## 2019-07-27 DIAGNOSIS — E669 Obesity, unspecified: Secondary | ICD-10-CM | POA: Diagnosis present

## 2019-07-27 DIAGNOSIS — K921 Melena: Secondary | ICD-10-CM | POA: Diagnosis not present

## 2019-07-27 DIAGNOSIS — R0602 Shortness of breath: Secondary | ICD-10-CM | POA: Diagnosis not present

## 2019-07-27 DIAGNOSIS — Z8262 Family history of osteoporosis: Secondary | ICD-10-CM

## 2019-07-27 DIAGNOSIS — Z03818 Encounter for observation for suspected exposure to other biological agents ruled out: Secondary | ICD-10-CM | POA: Diagnosis not present

## 2019-07-27 DIAGNOSIS — K5521 Angiodysplasia of colon with hemorrhage: Secondary | ICD-10-CM | POA: Diagnosis present

## 2019-07-27 DIAGNOSIS — Z6837 Body mass index (BMI) 37.0-37.9, adult: Secondary | ICD-10-CM

## 2019-07-27 DIAGNOSIS — G4700 Insomnia, unspecified: Secondary | ICD-10-CM | POA: Diagnosis present

## 2019-07-27 DIAGNOSIS — Z880 Allergy status to penicillin: Secondary | ICD-10-CM

## 2019-07-27 DIAGNOSIS — D62 Acute posthemorrhagic anemia: Principal | ICD-10-CM | POA: Diagnosis present

## 2019-07-27 DIAGNOSIS — Z8261 Family history of arthritis: Secondary | ICD-10-CM

## 2019-07-27 DIAGNOSIS — Z96642 Presence of left artificial hip joint: Secondary | ICD-10-CM | POA: Diagnosis present

## 2019-07-27 DIAGNOSIS — Z8249 Family history of ischemic heart disease and other diseases of the circulatory system: Secondary | ICD-10-CM

## 2019-07-27 DIAGNOSIS — K922 Gastrointestinal hemorrhage, unspecified: Secondary | ICD-10-CM | POA: Diagnosis present

## 2019-07-27 DIAGNOSIS — K449 Diaphragmatic hernia without obstruction or gangrene: Secondary | ICD-10-CM | POA: Diagnosis present

## 2019-07-27 LAB — CBC WITH DIFFERENTIAL/PLATELET
Abs Immature Granulocytes: 0 10*3/uL (ref 0.00–0.07)
Basophils Absolute: 0.1 10*3/uL (ref 0.0–0.1)
Basophils Relative: 1 %
Eosinophils Absolute: 0.2 10*3/uL (ref 0.0–0.5)
Eosinophils Relative: 2 %
HCT: 23.6 % — ABNORMAL LOW (ref 36.0–46.0)
Hemoglobin: 7.1 g/dL — ABNORMAL LOW (ref 12.0–15.0)
Lymphocytes Relative: 19 %
Lymphs Abs: 1.7 10*3/uL (ref 0.7–4.0)
MCH: 26.7 pg (ref 26.0–34.0)
MCHC: 30.1 g/dL (ref 30.0–36.0)
MCV: 88.7 fL (ref 80.0–100.0)
Monocytes Absolute: 0.3 10*3/uL (ref 0.1–1.0)
Monocytes Relative: 3 %
Neutro Abs: 6.7 10*3/uL (ref 1.7–7.7)
Neutrophils Relative %: 75 %
Platelets: 293 10*3/uL (ref 150–400)
RBC: 2.66 MIL/uL — ABNORMAL LOW (ref 3.87–5.11)
RDW: 21.8 % — ABNORMAL HIGH (ref 11.5–15.5)
WBC: 8.9 10*3/uL (ref 4.0–10.5)
nRBC: 0 % (ref 0.0–0.2)
nRBC: 0 /100 WBC

## 2019-07-27 LAB — COMPREHENSIVE METABOLIC PANEL
ALT: 22 U/L (ref 0–44)
AST: 20 U/L (ref 15–41)
Albumin: 3.5 g/dL (ref 3.5–5.0)
Alkaline Phosphatase: 69 U/L (ref 38–126)
Anion gap: 8 (ref 5–15)
BUN: 25 mg/dL — ABNORMAL HIGH (ref 8–23)
CO2: 22 mmol/L (ref 22–32)
Calcium: 9.8 mg/dL (ref 8.9–10.3)
Chloride: 105 mmol/L (ref 98–111)
Creatinine, Ser: 0.78 mg/dL (ref 0.44–1.00)
GFR calc Af Amer: >60 mL/min (ref >60)
GFR calc non Af Amer: >60 mL/min (ref >60)
Glucose, Bld: 109 mg/dL — ABNORMAL HIGH (ref 70–99)
Potassium: 3.8 mmol/L (ref 3.5–5.1)
Sodium: 135 mmol/L (ref 135–145)
Total Bilirubin: 0.6 mg/dL (ref 0.3–1.2)
Total Protein: 5.9 g/dL — ABNORMAL LOW (ref 6.5–8.1)

## 2019-07-27 NOTE — ED Triage Notes (Signed)
Pt to ED with c/o abnormal labs.  Pt st's she has anemia and had blood drawn yesterday and was told her hgb was low and told to come to ED.  Pt st's she has shortness of breath and slight dizziness.

## 2019-07-28 ENCOUNTER — Telehealth: Payer: Self-pay | Admitting: Hematology

## 2019-07-28 ENCOUNTER — Observation Stay (HOSPITAL_COMMUNITY): Payer: Medicare Other

## 2019-07-28 DIAGNOSIS — K922 Gastrointestinal hemorrhage, unspecified: Secondary | ICD-10-CM | POA: Diagnosis not present

## 2019-07-28 DIAGNOSIS — E785 Hyperlipidemia, unspecified: Secondary | ICD-10-CM | POA: Diagnosis not present

## 2019-07-28 DIAGNOSIS — D649 Anemia, unspecified: Secondary | ICD-10-CM

## 2019-07-28 DIAGNOSIS — G4733 Obstructive sleep apnea (adult) (pediatric): Secondary | ICD-10-CM | POA: Diagnosis not present

## 2019-07-28 DIAGNOSIS — D62 Acute posthemorrhagic anemia: Secondary | ICD-10-CM | POA: Diagnosis not present

## 2019-07-28 LAB — PREPARE RBC (CROSSMATCH)

## 2019-07-28 LAB — POC OCCULT BLOOD, ED: Fecal Occult Bld: POSITIVE — AB

## 2019-07-28 LAB — SARS CORONAVIRUS 2 (TAT 6-24 HRS): SARS Coronavirus 2: NEGATIVE

## 2019-07-28 LAB — HEMOGLOBIN AND HEMATOCRIT, BLOOD
HCT: 32.2 % — ABNORMAL LOW (ref 36.0–46.0)
Hemoglobin: 10.3 g/dL — ABNORMAL LOW (ref 12.0–15.0)

## 2019-07-28 MED ORDER — ONDANSETRON HCL 4 MG/2ML IJ SOLN: 4.0000 mg | Freq: Four times a day (QID) | INTRAMUSCULAR | Status: DC | PRN

## 2019-07-28 MED ORDER — ACETAMINOPHEN 650 MG RE SUPP: 650.0000 mg | Freq: Four times a day (QID) | RECTAL | Status: DC | PRN

## 2019-07-28 MED ORDER — SODIUM CHLORIDE 0.9 % IV SOLN
10.0000 mL/h | Freq: Once | INTRAVENOUS | Status: AC
  Administered 2019-07-28: 10 mL/h via INTRAVENOUS

## 2019-07-28 MED ORDER — DOXEPIN HCL 6 MG PO TABS: 6.0000 mg | ORAL_TABLET | Freq: Every day | ORAL | Status: DC

## 2019-07-28 MED ORDER — ALBUTEROL SULFATE (2.5 MG/3ML) 0.083% IN NEBU: 2.5000 mg | INHALATION_SOLUTION | Freq: Four times a day (QID) | RESPIRATORY_TRACT | Status: DC | PRN

## 2019-07-28 MED ORDER — DOXEPIN HCL 10 MG/ML PO CONC
6.0000 mg | Freq: Every day | ORAL | Status: DC
  Administered 2019-07-28: 6 mg via ORAL
  Filled 2019-07-28 (×2): qty 0.6

## 2019-07-28 MED ORDER — SODIUM CHLORIDE 0.9 % IV SOLN
8.0000 mg/h | INTRAVENOUS | Status: DC
  Filled 2019-07-28: qty 80

## 2019-07-28 MED ORDER — LORATADINE 10 MG PO TABS
10.0000 mg | ORAL_TABLET | Freq: Every day | ORAL | Status: DC
  Administered 2019-07-29: 10 mg via ORAL
  Filled 2019-07-28: qty 1

## 2019-07-28 MED ORDER — PANTOPRAZOLE SODIUM 40 MG IV SOLR: 40.0000 mg | Freq: Two times a day (BID) | INTRAVENOUS | Status: DC

## 2019-07-28 MED ORDER — TECHNETIUM TC 99M-LABELED RED BLOOD CELLS IV KIT
25.2000 | PACK | Freq: Once | INTRAVENOUS | Status: AC | PRN
  Administered 2019-07-28: 25.2 via INTRAVENOUS

## 2019-07-28 MED ORDER — ACETAMINOPHEN 325 MG PO TABS: 650.0000 mg | ORAL_TABLET | Freq: Four times a day (QID) | ORAL | Status: DC | PRN

## 2019-07-28 MED ORDER — SODIUM CHLORIDE 0.9% FLUSH
3.0000 mL | Freq: Two times a day (BID) | INTRAVENOUS | Status: DC
  Administered 2019-07-28 – 2019-07-29 (×3): 3 mL via INTRAVENOUS

## 2019-07-28 MED ORDER — ONDANSETRON HCL 4 MG PO TABS: 4.0000 mg | ORAL_TABLET | Freq: Four times a day (QID) | ORAL | Status: DC | PRN

## 2019-07-28 MED ORDER — DULOXETINE HCL 60 MG PO CPEP
60.0000 mg | ORAL_CAPSULE | Freq: Every day | ORAL | Status: DC
  Administered 2019-07-28: 60 mg via ORAL
  Filled 2019-07-28: qty 1

## 2019-07-28 MED ORDER — PRAVASTATIN SODIUM 10 MG PO TABS
20.0000 mg | ORAL_TABLET | Freq: Every day | ORAL | Status: DC
  Administered 2019-07-28: 20 mg via ORAL
  Filled 2019-07-28: qty 2

## 2019-07-28 MED ORDER — SODIUM CHLORIDE 0.9 % IV SOLN
80.0000 mg | Freq: Once | INTRAVENOUS | Status: DC
  Filled 2019-07-28: qty 80

## 2019-07-28 MED ORDER — FLUTICASONE PROPIONATE 50 MCG/ACT NA SUSP
2.0000 | Freq: Every day | NASAL | Status: DC
  Administered 2019-07-29: 2 via NASAL
  Filled 2019-07-28: qty 16

## 2019-07-28 NOTE — Consult Note (Addendum)
[facility] CONSULT Reason for consult: Anemia and GI bleeding Referring Physician: [facility] hospitalist.  PCP: Dr. Bevelyn Buckles.  Primary GI: Dr. Richmond Campbell  Molly Lucas is an 73 y.o. female.  HPI: She has a history of chronic low-grade GI bleeding and anemia.  She has been followed for some time by Dr. Earlean Shawl.  Had a normal colonoscopy and EGD 8/15 by Dr. Earlean Shawl.  This was due to chronic anemia.  She had a small hiatal hernia.  Her iron levels and hemoglobin had fallen and she has required iron infusions.  She has had rare passage of black stools.She had EGD by Dr. Earlean Shawl showing some esophageal and gastric ulcerations at that time and a capsule endoscopy was to be performed in his office.The patient was subsequently admitted 5/20 with GI bleeding.  She was seen by Dr. Tarri Glenn.  She had had a subsequent CT scan showing a large hiatal hernia with estimated one half of the stomach up in the chest.  EGD and colonoscopy repeated on that admission 5/20 just a few months ago.  Colonoscopy was negative EGD was negative as well other than a large hiatal hernia.  Patient reported she had had a capsule endoscopy showing AVMs.  This apparently was done in Dr. Liliane Channel office. She was admitted 7/20 for a few days had a low hemoglobin and was transfused 3 units of PRBCs and discharged home to follow-up with Dr. Earlean Shawl. She then was referred to [facility] and underwent small bowel enteroscopy just 1 month ago by Dr. Olegario Messier which according to the patient did not show any clear bleeding AVMs.  The patient has been on PPIs as well as Pepcid and was told to stop taking any of these things somewhere along the way.  She is continued to have iron infusions. She reports that she has been doing fairly well and at the beginning of this month her hemoglobin was around 11.  She had begun feeling more dizzy over the past several days short of breath with dark stools that started a couple days ago.  No  bright red blood.  No abdominal pain or dyspepsia.  She denies taking aspirin or any other NSAIDs.  She apparently was told by some GI doctor that she should not take acid reducing medications because they can make her bleeding worse.  She is being admitted for blood transfusion after her hemoglobin was found to be 7.1.  Were asked to see her regarding any further needed work-up. Her other problems include hypertension, sleep apnea.  Past Medical History:  Diagnosis Date  . Anemia    Takes iron supplement  . Anxiety   . Arthritis   . Chronic pain of left thumb   . Constipation   . 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 Depression   . Fibroid, uterine   . GERD (gastroesophageal reflux disease)   . History of blood transfusion   . Hx of seasonal allergies   . Hyperlipemia   . Hypertension    hx of off medication now.  . OA (osteoarthritis) of hip    left  . Sleep apnea    dentist made mouthipiece     Past Surgical History:  Procedure Laterality Date  . BIOPSY  03/30/2019   Procedure: BIOPSY;  Surgeon: Thornton Park, MD;  Location: [place];  Service: Gastroenterology;;  . Mortimer Fries SUSPENSION PLASTY  08/2015   with abductor pollicis longus tendon transfer  and suspensionplasty  . COLONOSCOPY WITH PROPOFOL N/A 03/31/2019   Procedure: COLONOSCOPY WITH PROPOFOL;  Surgeon: Thornton Park, MD;  Location: [place];  Service: Gastroenterology;  Laterality: N/A;  . DE QUERVAIN'S RELEASE  08/2015  . DILATATION & CURRETTAGE/HYSTEROSCOPY WITH RESECTOCOPE  2007  . ESOPHAGOGASTRODUODENOSCOPY N/A 11/30/2015   Procedure: ESOPHAGOGASTRODUODENOSCOPY (EGD);  Surgeon: Richmond Campbell, MD;  [facility];  Service: Endoscopy;  Laterality: N/A;  . ESOPHAGOGASTRODUODENOSCOPY (EGD) WITH PROPOFOL N/A 03/30/2019   Procedure: ESOPHAGOGASTRODUODENOSCOPY (EGD) WITH PROPOFOL;  Surgeon: Thornton Park, MD;  Location: [place];   Service: Gastroenterology;  Laterality: N/A;  . HERNIA REPAIR Left 06/2012  . INGUINAL HERNIA REPAIR Right 05/03/2013   Procedure: HERNIA REPAIR INGUINAL ADULT;  Surgeon: Harl Bowie, MD;  Location: [place];  Service: General;  Laterality: Right;  . INGUINAL HERNIA REPAIR Right 08/23/2013   Procedure: LAPAROSCOPIC INGUINAL HERNIA;  Surgeon: Harl Bowie, MD;  Location: [place];  Service: General;  Laterality: Right;  . INSERTION OF MESH Right 05/03/2013   Procedure: INSERTION OF MESH;  Surgeon: Harl Bowie, MD;  Location: [place];  Service: General;  Laterality: Right;  . INSERTION OF MESH Right 08/23/2013   Procedure: INSERTION OF MESH;  Surgeon: Harl Bowie, MD;  Location: [place];  Service: General;  Laterality: Right;  . METACARPOPHALANGEAL JOINT ARTHRODESIS  08/2015   with local bone graft  . TONSILLECTOMY    . TOTAL HIP ARTHROPLASTY  06/24/2012   Procedure: TOTAL HIP ARTHROPLASTY ANTERIOR APPROACH;  Surgeon: Mcarthur Rossetti, MD;  Location: [facility];  Service: Orthopedics;  Laterality: Left;  Left total hip arthroplasty, anterior approach  . UTERINE FIBROID SURGERY  2007  . WISDOM TOOTH EXTRACTION      Family History  Problem Relation Age of Onset  . Heart failure Mother   . Osteoporosis Mother   . Arthritis Mother   . CAD Maternal Grandfather   . Heart failure Maternal Grandfather   . Dementia Maternal Grandfather   . CAD Paternal Grandmother   . CAD Paternal Grandfather   . Arthritis Sister     Social History:  reports that she has never smoked. She has never used smokeless tobacco. She reports current alcohol use of about 2.0 standard drinks of alcohol per week. She reports that she does not use drugs.  Allergies:  Allergies  Allergen Reactions  . Penicillins Swelling and Other (See Comments)    Tongue became swollen and turned black Did it involve swelling of the face/tongue/throat, SOB, or low BP? Yes Did it involve sudden or severe rash/hives,  skin peeling, or any reaction on the inside of your mouth or nose? Yes Did you need to seek medical attention at a hospital or doctor's office? Yes When did it last happen? "I was a child" If all above answers are "NO", may proceed with cephalosporin use.     Medications; Prior to Admission medications   Medication Sig Start Date End Date Taking? Authorizing Provider  CALCIUM PO Take 2 tablets by mouth daily.    Yes [provider]  cetirizine (ZYRTEC) 10 MG tablet Take 10 mg by mouth daily.   Yes [provider]  Cholecalciferol (VITAMIN D3) 250 MCG (10000 UT) TABS Take 10,000 Units by mouth daily.    Yes [provider]  Cinnamon 500 MG capsule Take 1,000 mg by mouth daily.    Yes [provider]  Doxepin HCl (SILENOR) 6 MG TABS Take 6 mg by mouth at bedtime.  Yes [provider]  DULoxetine (CYMBALTA) 60 MG capsule Take 60 mg by mouth at bedtime.  03/12/19  Yes [provider]  fluticasone (FLONASE) 50 MCG/ACT nasal spray Place 2 sprays into both nostrils daily.   Yes [provider]  Multiple Vitamins-Minerals (HAIR SKIN AND NAILS FORMULA PO) Take 1 tablet by mouth daily.   Yes [provider]  multivitamin-lutein (OCUVITE-LUTEIN) CAPS Take 1 capsule by mouth daily.   Yes [provider]  NON FORMULARY Take 1 capsule by mouth See admin instructions. Black Cumiun oil capsules: Take 1 capsule by mouth once a day   Yes [provider]  Omega-3 1000 MG CAPS Take 2,000 mg by mouth daily.    Yes [provider]  pravastatin (PRAVACHOL) 20 MG tablet Take 20 mg by mouth at bedtime.    Yes [provider]  Turmeric 500 MG CAPS Take 500 mg by mouth daily.    Yes [provider]  vitamin C (ASCORBIC ACID) 500 MG tablet Take 500 mg by mouth daily.   Yes [provider]   . Doxepin HCl  6 mg Oral QHS  . DULoxetine  60 mg Oral QHS  . fluticasone  2 spray Each Nare Daily  .  loratadine  10 mg Oral Daily  . pravastatin  20 mg Oral QHS  . sodium chloride flush  3 mL Intravenous Q12H   PRN Meds acetaminophen **OR** acetaminophen, albuterol, ondansetron **OR** ondansetron (ZOFRAN) IV Results for orders placed or performed during the hospital encounter of 07/27/19 (from the past 48 hour(s))  Type and screen [place]     Status: None (Preliminary result)   Collection Time: 07/27/19  5:06 PM  Result Value Ref Range   ABO/RH(D) O POS    Antibody Screen POS    Sample Expiration 07/30/2019,2359    Antibody Identification ANTI K    Unit Number [id number]    Blood Component Type RED CELLS,LR    Unit division 00    Status of Unit ISSUED    Donor AG Type ANTI K    Transfusion Status OK TO TRANSFUSE    Crossmatch Result COMPATIBLE    Unit Number [id number]    Blood Component Type RED CELLS,LR    Unit division 00    Status of Unit ISSUED    Donor AG Type ANTI K    Transfusion Status OK TO TRANSFUSE    Crossmatch Result COMPATIBLE   CBC with Differential     Status: Abnormal   Collection Time: 07/27/19  5:29 PM  Result Value Ref Range   WBC 8.9 4.0 - 10.5 K/uL   RBC 2.66 (L) 3.87 - 5.11 MIL/uL   Hemoglobin 7.1 (L) 12.0 - 15.0 g/dL   HCT 23.6 (L) 36.0 - 46.0 %   MCV 88.7 80.0 - 100.0 fL   MCH 26.7 26.0 - 34.0 pg   MCHC 30.1 30.0 - 36.0 g/dL   RDW 21.8 (H) 11.5 - 15.5 %   Platelets 293 150 - 400 K/uL   nRBC 0.0 0.0 - 0.2 %   Neutrophils Relative % 75 %   Neutro Abs 6.7 1.7 - 7.7 K/uL   Lymphocytes Relative 19 %   Lymphs Abs 1.7 0.7 - 4.0 K/uL   Monocytes Relative 3 %   Monocytes Absolute 0.3 0.1 - 1.0 K/uL   Eosinophils Relative 2 %   Eosinophils Absolute 0.2 0.0 - 0.5 K/uL   Basophils Relative 1 %   Basophils Absolute  0.1 0.0 - 0.1 K/uL   nRBC 0 0 /100 WBC   Abs Immature Granulocytes 0.00 0.00 - 0.07 K/uL   Polychromasia PRESENT    Ovalocytes PRESENT     Comment: Performed at [facility], [place] [address]  [place], [place] [address]  Comprehensive metabolic panel     Status: Abnormal   Collection Time: 07/27/19  5:29 PM  Result Value Ref Range   Sodium 135 135 - 145 mmol/L   Potassium 3.8 3.5 - 5.1 mmol/L   Chloride 105 98 - 111 mmol/L   CO2 22 22 - 32 mmol/L   Glucose, Bld 109 (H) 70 - 99 mg/dL   BUN 25 (H) 8 - 23 mg/dL   Creatinine, Ser 0.78 0.44 - 1.00 mg/dL   Calcium 9.8 8.9 - 10.3 mg/dL   Total Protein 5.9 (L) 6.5 - 8.1 g/dL   Albumin 3.5 3.5 - 5.0 g/dL   AST 20 15 - 41 U/L   ALT 22 0 - 44 U/L   Alkaline Phosphatase 69 38 - 126 U/L   Total Bilirubin 0.6 0.3 - 1.2 mg/dL   GFR calc non Af Amer >60 >60 mL/min   GFR calc Af Amer >60 >60 mL/min   Anion gap 8 5 - 15    Comment: Performed at [facility] [address] [place], [place] [address]  Prepare RBC     Status: None   Collection Time: 07/28/19  8:00 AM  Result Value Ref Range   Order Confirmation      ORDER PROCESSED BY BLOOD BANK Performed at [facility], [place] [address], [place], [place] [address]   POC occult blood, ED     Status: Abnormal   Collection Time: 07/28/19 10:51 AM  Result Value Ref Range   Fecal Occult Bld POSITIVE (A) NEGATIVE    Dg Chest 2 View  Result Date: 07/27/2019 CLINICAL DATA:  Pt came in with sob and a low blood count,hemoglobin yesterday was 8, has had a past ulcer in her hiatal hernia, and today she is hoping to be admitted for further testing, hx non smoker, no known heart hx, no htn or diabetes EXAM: CHEST - 2 VIEW COMPARISON:  11/29/2015 FINDINGS: Cardiac silhouette is normal in size. Moderate to large hiatal hernia. No mediastinal or hilar masses or evidence of adenopathy. Clear lungs.  No pleural effusion or pneumothorax. Skeletal structures are intact. IMPRESSION: No active cardiopulmonary disease. Electronically Signed   By: Lajean Manes M.D.   On: 07/27/2019 18:12               Blood pressure 123/76, pulse 77, temperature 100.2 F (37.9 C), temperature source Oral,  resp. rate 20, height 5\' 2"  (1.575 m), weight 83.9 kg, SpO2 100 %.  Physical exam:   General--somewhat obese white female in no distress ENT--nonicteric Neck--supple with no lymphadenopathy Heart--regular rate and rhythm without murmurs or gallops Lungs--clear anteriorly and posteriorly Abdomen--soft and nontender Psych--alert and oriented answers questions appropriately mood and affect appropriate   Assessment: 1.  Chronic GI bleeding with secondary anemia.  Source most likely AVMs, large hiatal hernia with CT showing approximately half the stomach in the chest or a combination of these 2.  She is not actively bleeding and her vital signs are stable  Plan: 1.  Would go ahead and transfuse her as you are.  Will order initially GI bleeding scan.  If this is negative a CT angiogram would not likely be positive.  2.  We will attempt to get her primary GI doctor to discuss with him.   Nancy Fetter 07/28/2019, 1:25 PM   This note was created using voice recognition software and minor errors may Have occurred unintentionally. Pager: [phone] If no answer or after hours call [phone] At the patient's request, we tried to call her primary gastroenterologist Dr. Earlean Shawl.  She had his personal phone number.  We call this number and left a message there was no answer.  Phone [phone]

## 2019-07-28 NOTE — Progress Notes (Signed)
New Admission Note: ? Arrival Method: via stretcher Mental Orientation: A/O x 4 Telemetry:  Assessment: Completed Skin: Refer to flowsheet IV: Right wrist Pain: none Tubes: Safety Measures: Safety Fall Prevention Plan discussed with patient. Admission: Completed [facility] [place] Orientation: Patient has been orientated to the room, unit and the staff. Family: Orders have been reviewed and are being implemented. Will continue to monitor the patient. Call light has been placed within reach and bed alarm has been activated.  ? [employer], [place]

## 2019-07-28 NOTE — ED Provider Notes (Signed)
[facility] EMERGENCY DEPARTMENT Provider Note   CSN: [id number] Arrival date & time: 07/27/19  1609     History   Chief Complaint Chief Complaint  Patient presents with  . Abnormal Lab    HPI Molly Lucas is a 73 y.o. female.     73 year old female with history of GI bleed presents with low hemoglobin.  Patient states that her hemoglobin last was 11 and that she went to her doctor's office yesterday and had blood drawn and it was 8.  Notes dark stools denies taking any iron pills.  She is currently receiving iron transfusions.  Has had similar symptoms in the past and has been worked up for upper as well as lower GI bleeding without an etiology.  Denies any hematemesis.  States that she has been dizzy as well as short of breath with exertion.  Denies any cough or congestion.  No fever or chills.  No treatment use prior to arrival.     Past Medical History:  Diagnosis Date  . Anemia    Takes iron supplement  . Anxiety   . Arthritis   . Chronic pain of left thumb   . Constipation   . 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 Depression   . Fibroid, uterine   . GERD (gastroesophageal reflux disease)   . History of blood transfusion   . Hx of seasonal allergies   . Hyperlipemia   . Hypertension    hx of off medication now.  . OA (osteoarthritis) of hip    left  . Sleep apnea    dentist made mouthipiece     Patient Active Problem List   Diagnosis Date Noted  . GERD (gastroesophageal reflux disease) 05/19/2019  . Depression 05/19/2019  . History of gastric ulcer 03/29/2019  . Insomnia 03/29/2019  . GI bleed 03/29/2019  . Excessive daytime sleepiness 06/20/2018  . SOB (shortness of breath) on exertion 06/20/2018  . Fatigue due to depression 06/20/2018  . OSA (obstructive sleep apnea) 06/20/2018  . Trigger thumb of right hand 04/29/2017  . Iron deficiency anemia 04/09/2016  . HLD (hyperlipidemia)  02/26/2016  . Family history of heart disease 02/26/2016  . Trivial Pericardial effusion 02/26/2016  . Anemia 11/29/2015  . Symptomatic anemia 11/29/2015  . Dyspnea   . Melena   . Occult blood in stools   . Recurrent right inguinal hernia 08/15/2013  . Right inguinal hernia 03/14/2013  . Degenerative arthritis of hip 06/24/2012    Past Surgical History:  Procedure Laterality Date  . BIOPSY  03/30/2019   Procedure: BIOPSY;  Surgeon: Thornton Park, MD;  Location: [place];  Service: Gastroenterology;;  . [place]  08/2015   with abductor pollicis longus tendon transfer and suspensionplasty  . COLONOSCOPY WITH PROPOFOL N/A 03/31/2019   Procedure: COLONOSCOPY WITH PROPOFOL;  Surgeon: Thornton Park, MD;  Location: [place];  Service: Gastroenterology;  Laterality: N/A;  . DE QUERVAIN'S RELEASE  08/2015  . DILATATION & CURRETTAGE/HYSTEROSCOPY WITH RESECTOCOPE  2007  . ESOPHAGOGASTRODUODENOSCOPY N/A 11/30/2015   Procedure: ESOPHAGOGASTRODUODENOSCOPY (EGD);  Surgeon: Richmond Campbell, MD;  [facility];  Service: Endoscopy;  Laterality: N/A;  . ESOPHAGOGASTRODUODENOSCOPY (EGD) WITH PROPOFOL N/A 03/30/2019   Procedure: ESOPHAGOGASTRODUODENOSCOPY (EGD) WITH PROPOFOL;  Surgeon: Thornton Park, MD;  Location: [place];  Service: Gastroenterology;  Laterality: N/A;  . HERNIA REPAIR Left 06/2012  . INGUINAL HERNIA REPAIR Right 05/03/2013   Procedure: HERNIA  REPAIR INGUINAL ADULT;  Surgeon: Harl Bowie, MD;  Location: [facility];  Service: General;  Laterality: Right;  . INGUINAL HERNIA REPAIR Right 08/23/2013   Procedure: LAPAROSCOPIC INGUINAL HERNIA;  Surgeon: Harl Bowie, MD;  Location: [place];  Service: General;  Laterality: Right;  . INSERTION OF MESH Right 05/03/2013   Procedure: INSERTION OF MESH;  Surgeon: Harl Bowie, MD;  Location: [place];  Service: General;  Laterality: Right;  . INSERTION OF MESH Right 08/23/2013    Procedure: INSERTION OF MESH;  Surgeon: Harl Bowie, MD;  Location: [facility] [place];  Service: General;  Laterality: Right;  . METACARPOPHALANGEAL JOINT ARTHRODESIS  08/2015   with local bone graft  . TONSILLECTOMY    . TOTAL HIP ARTHROPLASTY  06/24/2012   Procedure: TOTAL HIP ARTHROPLASTY ANTERIOR APPROACH;  Surgeon: Mcarthur Rossetti, MD;  Location: [facility];  Service: Orthopedics;  Laterality: Left;  Left total hip arthroplasty, anterior approach  . UTERINE FIBROID SURGERY  2007  . WISDOM TOOTH EXTRACTION       OB History   No obstetric history on file.      Home Medications    Prior to Admission medications   Medication Sig Start Date End Date Taking? Authorizing Provider  CALCIUM PO Take 2 tablets by mouth daily.     [provider]  Camphor-Eucalyptus-Menthol (VICKS VAPORUB) A999333 % OINT 1 application See admin instructions. Apply under the nostrils at bedtime    [provider]  cetirizine (ZYRTEC) 10 MG tablet Take 10 mg by mouth daily.    [provider]  Cholecalciferol (VITAMIN D3) 250 MCG (10000 UT) TABS Take 10,000 Units by mouth daily.     [provider]  Cinnamon 500 MG capsule Take 500 mg by mouth daily.    [provider]  Doxepin HCl (SILENOR) 6 MG TABS Take 6 mg by mouth at bedtime.     [provider]  DULoxetine (CYMBALTA) 30 MG capsule Take 60 mg by mouth at bedtime. 03/12/19   [provider]  Garlic [id number] MG CAPS Take 2,000 mg by mouth daily.     [provider]  Multiple Vitamins-Minerals (HAIR/SKIN/NAILS) TABS Take 1 tablet by mouth daily.    [provider]  multivitamin-lutein (OCUVITE-LUTEIN) CAPS Take 1 capsule by mouth daily.    [provider]  NON FORMULARY Take 1 capsule by mouth See admin instructions. Black Cumiun oil capsules: Take 1 capsule by mouth once a day    [provider]  Omega-3 1000 MG CAPS Take 2,000 mg by mouth daily.     [provider]  pravastatin (PRAVACHOL) 20 MG tablet Take 20 mg by mouth at bedtime.     [provider]  Probiotic Product (PROBIOTIC DAILY PO) Take 1 capsule by mouth daily.     [provider]  Turmeric 500 MG CAPS Take 500 mg by mouth daily.     [provider]  vitamin C (ASCORBIC ACID) 500 MG tablet Take 500 mg by mouth daily.    [provider]    Family History Family History  Problem Relation Age of Onset  . Heart failure Mother   . Osteoporosis Mother   . Arthritis Mother   . CAD Maternal Grandfather   . Heart failure Maternal Grandfather   . Dementia Maternal Grandfather   . CAD Paternal Grandmother   . CAD Paternal Grandfather   . Arthritis Sister     Social History Social History  Tobacco Use  . Smoking status: Never Smoker  . Smokeless tobacco: Never Used  Substance Use Topics  . Alcohol use: Yes    Alcohol/week: 2.0 standard drinks    Types: 2 Glasses of wine per week    Comment: occasional glass of wine   . Drug use: No     Allergies   Penicillins   Review of Systems Review of Systems  All other systems reviewed and are negative.    Physical Exam Updated Vital Signs BP 109/69 (BP Location: Right Arm)   Pulse 79   Temp 98.2 F (36.8 C) (Oral)   Resp 14   Ht 1.575 m (5\' 2" )   Wt 83.9 kg   SpO2 100%   BMI 33.84 kg/m   Physical Exam Vitals signs and nursing note reviewed.  Constitutional:      General: She is not in acute distress.    Appearance: Normal appearance. She is well-developed. She is not toxic-appearing.  HENT:     Head: Normocephalic and atraumatic.  Eyes:     General: Lids are normal.     Conjunctiva/sclera: Conjunctivae normal.     Pupils: Pupils are equal, round, and reactive to light.  Neck:     Musculoskeletal: Normal range of motion and neck supple.     Thyroid: No thyroid mass.     Trachea: No tracheal deviation.  Cardiovascular:     Rate and Rhythm: Normal rate and regular  rhythm.     Heart sounds: Normal heart sounds. No murmur. No gallop.   Pulmonary:     Effort: Pulmonary effort is normal. No respiratory distress.     Breath sounds: Normal breath sounds. No stridor. No decreased breath sounds, wheezing, rhonchi or rales.  Abdominal:     General: Bowel sounds are normal. There is no distension.     Palpations: Abdomen is soft.     Tenderness: There is no abdominal tenderness. There is no rebound.  Genitourinary:    Comments: Dark stools noted on digital rectal exam Musculoskeletal: Normal range of motion.        General: No tenderness.  Skin:    General: Skin is warm and dry.     Findings: No abrasion or rash.  Neurological:     Mental Status: She is alert and oriented to person, place, and time.     GCS: GCS eye subscore is 4. GCS verbal subscore is 5. GCS motor subscore is 6.     Cranial Nerves: No cranial nerve deficit.     Sensory: No sensory deficit.  Psychiatric:        Speech: Speech normal.        Behavior: Behavior normal.      ED Treatments / Results  Labs (all labs ordered are listed, but only abnormal results are displayed) Labs Reviewed  CBC WITH DIFFERENTIAL/PLATELET - Abnormal; Notable for the following components:      Result Value   RBC 2.66 (*)    Hemoglobin 7.1 (*)    HCT 23.6 (*)    RDW 21.8 (*)    All other components within normal limits  COMPREHENSIVE METABOLIC PANEL - Abnormal; Notable for the following components:   Glucose, Bld 109 (*)    BUN 25 (*)    Total Protein 5.9 (*)    All other components within normal limits  SARS CORONAVIRUS 2 (TAT 6-24 HRS)  TYPE AND SCREEN  PREPARE RBC (CROSSMATCH)    EKG None  Radiology Dg Chest 2 View  Result Date: 07/27/2019 CLINICAL DATA:  Pt came in with sob and a low blood count,hemoglobin yesterday was 8, has had a past ulcer in her hiatal hernia, and today she is hoping to be admitted for further testing, hx non smoker, no known heart hx, no htn or diabetes EXAM:  CHEST - 2 VIEW COMPARISON:  11/29/2015 FINDINGS: Cardiac silhouette is normal in size. Moderate to large hiatal hernia. No mediastinal or hilar masses or evidence of adenopathy. Clear lungs.  No pleural effusion or pneumothorax. Skeletal structures are intact. IMPRESSION: No active cardiopulmonary disease. Electronically Signed   By: Lajean Manes M.D.   On: 07/27/2019 18:12    Procedures Procedures (including critical care time)  Medications Ordered in ED Medications  0.9 %  sodium chloride infusion (has no administration in time range)     Initial Impression / Assessment and Plan / ED Course  I have reviewed the triage vital signs and the nursing notes.  Pertinent labs & imaging results that were available during my care of the patient were reviewed by me and considered in my medical decision making (see chart for details).        Patient's hemoglobin here is 7.1.  2 units of packed red blood cells ordered.  Will consult hospitalist for admission  Final Clinical Impressions(s) / ED Diagnoses   Final diagnoses:  None    ED Discharge Orders    None       Lacretia Leigh, MD 07/28/19 [phone]

## 2019-07-28 NOTE — ED Notes (Signed)
Clear liquid lunch tray ordered 

## 2019-07-28 NOTE — ED Notes (Signed)
Pt signed consent and verbalized understanding of plan of care.  At bedside.

## 2019-07-28 NOTE — ED Notes (Signed)
Pt to nuclear med at this time.

## 2019-07-28 NOTE — Telephone Encounter (Signed)
Returned patient's phone call regarding scheduling an appointment, left a voicemail. 

## 2019-07-28 NOTE — H&P (Addendum)
History and Physical    Molly Lucas [facility] [id number] DOB: [birth year] DOA: 07/27/2019  Referring MD/NP/PA: Lacretia Leigh, MD PCP: [name], MD  Patient coming from: Home  Chief Complaint: Abnormal lab  I have personally briefly reviewed patient's old medical records in [place]   HPI: Molly Lucas is a 73 y.o. female with medical history significant of hypertension, hyperlipidemia, arthritis, GI bleed secondary to AVM, osteoarthritis, OSA, and GERD; who presents at the advisement of her primary care provider after found to have abnormal labs.  She reports that her hemoglobin was around 11 g/dL at the beginning of this month.  However, over the last 5 days she has been begun feeling more short of breath with exertion.  Symptoms progressively worsened to the point even walking down her hallway in her home became difficult.  Yesterday she started having dark stools.  Reports having 2-3 bowel movements.  Denies being on any aspirin, NSAIDs, or blood thinners.  She was told previously by her GI doctor not to take Protonix as it could worsen bleeding. Patient is followed by Dr. Earlean Shawl at [facility].  Last had endoscopy and colonoscopy on 03/30/2019 by Dr. Tarri Glenn for anemia with heme positive stool.  Review of records noted patient to have normal esophagus, large hiatal hernia with no Cameron erosions visualized, normal stomach, normal duodenum, and normal colon without source of bleeding found.  Later on in May patient reports having a camera endoscopy which showed 2 small AVMs, and later had a enteroscopy in August did not show any acute as the cause for her bleeding.   ED Course: Upon admission into the emergency department patient was noted to have vital signs within normal limits.  Labs significant for hemoglobin 7.1, BUN 25, and creatinine 0.78.  X-ray was otherwise clear. Stools were reported to be melanotic.  Stool guaiac pending.   Patient was typed  and screened and ordered to be transfused packed red blood cells.  TRH called to admit.  Review of Systems  Constitutional: Positive for malaise/fatigue. Negative for fever.  HENT: Negative for congestion and ear discharge.   Eyes: Negative for photophobia and pain.  Respiratory: Positive for shortness of breath.   Cardiovascular: Negative for chest pain and leg swelling.  Gastrointestinal: Positive for melena. Negative for abdominal pain and nausea.  Genitourinary: Negative for dysuria and frequency.  Musculoskeletal: Positive for joint pain. Negative for falls.  Skin: Negative for itching.  Neurological: Positive for dizziness. Negative for tingling and loss of consciousness.  Psychiatric/Behavioral: Negative for memory loss and substance abuse.    Past Medical History:  Diagnosis Date  . Anemia    Takes iron supplement  . Anxiety   . Arthritis   . Chronic pain of left thumb   . Constipation   . 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 Depression   . Fibroid, uterine   . GERD (gastroesophageal reflux disease)   . History of blood transfusion   . Hx of seasonal allergies   . Hyperlipemia   . Hypertension    hx of off medication now.  . OA (osteoarthritis) of hip    left  . Sleep apnea    dentist made mouthipiece     Past Surgical History:  Procedure Laterality Date  . BIOPSY  03/30/2019   Procedure: BIOPSY;  Surgeon: Thornton Park, MD;  Location: [place];  Service: Gastroenterology;;  . Mortimer Fries SUSPENSION PLASTY  08/2015   with abductor pollicis longus tendon transfer and suspensionplasty  . COLONOSCOPY WITH PROPOFOL N/A 03/31/2019   Procedure: COLONOSCOPY WITH PROPOFOL;  Surgeon: Thornton Park, MD;  Location: [place];  Service: Gastroenterology;  Laterality: N/A;  . DE QUERVAIN'S RELEASE  08/2015  . DILATATION & CURRETTAGE/HYSTEROSCOPY WITH RESECTOCOPE  2007  . ESOPHAGOGASTRODUODENOSCOPY N/A  11/30/2015   Procedure: ESOPHAGOGASTRODUODENOSCOPY (EGD);  Surgeon: Richmond Campbell, MD;  Location: [facility];  Service: Endoscopy;  Laterality: N/A;  . ESOPHAGOGASTRODUODENOSCOPY (EGD) WITH PROPOFOL N/A 03/30/2019   Procedure: ESOPHAGOGASTRODUODENOSCOPY (EGD) WITH PROPOFOL;  Surgeon: Thornton Park, MD;  Location: [place];  Service: Gastroenterology;  Laterality: N/A;  . HERNIA REPAIR Left 06/2012  . INGUINAL HERNIA REPAIR Right 05/03/2013   Procedure: HERNIA REPAIR INGUINAL ADULT;  Surgeon: Harl Bowie, MD;  Location: [place];  Service: General;  Laterality: Right;  . INGUINAL HERNIA REPAIR Right 08/23/2013   Procedure: LAPAROSCOPIC INGUINAL HERNIA;  Surgeon: Harl Bowie, MD;  Location: [place];  Service: General;  Laterality: Right;  . INSERTION OF MESH Right 05/03/2013   Procedure: INSERTION OF MESH;  Surgeon: Harl Bowie, MD;  Location: [place];  Service: General;  Laterality: Right;  . INSERTION OF MESH Right 08/23/2013   Procedure: INSERTION OF MESH;  Surgeon: Harl Bowie, MD;  Location: [place];  Service: General;  Laterality: Right;  . METACARPOPHALANGEAL JOINT ARTHRODESIS  08/2015   with local bone graft  . TONSILLECTOMY    . TOTAL HIP ARTHROPLASTY  06/24/2012   Procedure: TOTAL HIP ARTHROPLASTY ANTERIOR APPROACH;  Surgeon: Mcarthur Rossetti, MD;  Location: [facility];  Service: Orthopedics;  Laterality: Left;  Left total hip arthroplasty, anterior approach  . UTERINE FIBROID SURGERY  2007  . WISDOM TOOTH EXTRACTION       reports that she has never smoked. She has never used smokeless tobacco. She reports current alcohol use of about 2.0 standard drinks of alcohol per week. She reports that she does not use drugs.  Allergies  Allergen Reactions  . Penicillins Swelling and Other (See Comments)    Tongue became swollen and turned black Did it involve swelling of the face/tongue/throat, SOB, or low BP? Yes Did it involve sudden or severe rash/hives, skin  peeling, or any reaction on the inside of your mouth or nose? Yes Did you need to seek medical attention at a hospital or doctor's office? Yes When did it last happen? "I was a child" If all above answers are "NO", may proceed with cephalosporin use.     Family History  Problem Relation Age of Onset  . Heart failure Mother   . Osteoporosis Mother   . Arthritis Mother   . CAD Maternal Grandfather   . Heart failure Maternal Grandfather   . Dementia Maternal Grandfather   . CAD Paternal Grandmother   . CAD Paternal Grandfather   . Arthritis Sister     Prior to Admission medications   Medication Sig Start Date End Date Taking? Authorizing Provider  CALCIUM PO Take 2 tablets by mouth daily.     [provider]  Camphor-Eucalyptus-Menthol (VICKS VAPORUB) A999333 % OINT 1 application See admin instructions. Apply under the nostrils at bedtime    [provider]  cetirizine (ZYRTEC) 10 MG tablet Take 10 mg by mouth daily.    [provider]  Cholecalciferol (VITAMIN D3) 250 MCG (10000 UT) TABS Take 10,000 Units by mouth daily.     [provider]  Cinnamon 500 MG capsule Take  500 mg by mouth daily.    [provider]  Doxepin HCl (SILENOR) 6 MG TABS Take 6 mg by mouth at bedtime.     [provider]  DULoxetine (CYMBALTA) 30 MG capsule Take 60 mg by mouth at bedtime. 03/12/19   [provider]  Garlic [id number] MG CAPS Take 2,000 mg by mouth daily.     [provider]  Multiple Vitamins-Minerals (HAIR/SKIN/NAILS) TABS Take 1 tablet by mouth daily.    [provider]  multivitamin-lutein (OCUVITE-LUTEIN) CAPS Take 1 capsule by mouth daily.    [provider]  NON FORMULARY Take 1 capsule by mouth See admin instructions. Black [place] oil capsules: Take 1 capsule by mouth once a day    [provider]  Omega-3 1000 MG CAPS Take 2,000 mg by mouth daily.     [provider]  pravastatin  (PRAVACHOL) 20 MG tablet Take 20 mg by mouth at bedtime.     [provider]  Probiotic Product (PROBIOTIC DAILY PO) Take 1 capsule by mouth daily.     [provider]  Turmeric 500 MG CAPS Take 500 mg by mouth daily.     [provider]  vitamin C (ASCORBIC ACID) 500 MG tablet Take 500 mg by mouth daily.    [provider]    Physical Exam:  Constitutional: Obese elderly female currently in NAD, calm, comfortable Vitals:   07/27/19 2045 07/27/19 2227 07/28/19 0336 07/28/19 0629  BP: 117/70 130/80 118/69 109/69  Pulse: 87 81 82 79  Resp: 18 17 14 14   Temp:   (!) 97.5 F (36.4 C) 98.2 F (36.8 C)  TempSrc:   Oral Oral  SpO2: 100% 100% 96% 100%  Weight:      Height:       Eyes: PERRL, lids and conjunctivae normal ENMT: Mucous membranes are moist. Posterior pharynx clear of any exudate or lesions.Normal dentition.  Neck: normal, supple, no masses, no thyromegaly Respiratory: clear to auscultation bilaterally, no wheezing, no crackles. Normal respiratory effort. No accessory muscle use.  Cardiovascular: Regular rate and rhythm, no murmurs / rubs / gallops. No extremity edema. 2+ pedal pulses. No carotid bruits.  Abdomen: no tenderness, no masses palpated. No hepatosplenomegaly. Bowel sounds positive.  Musculoskeletal: no clubbing / cyanosis. No joint deformity upper and lower extremities. Good ROM, no contractures. Normal muscle tone.  Skin: no rashes, lesions, ulcers. No induration Neurologic: CN 2-12 grossly intact. Sensation intact, DTR normal. Strength 5/5 in all 4.  Psychiatric: Normal judgment and insight. Alert and oriented x 3. Normal mood.     Labs on Admission: I have personally reviewed following labs and imaging studies  CBC: Recent Labs  Lab 07/27/19 1729  WBC 8.9  NEUTROABS 6.7  HGB 7.1*  HCT 23.6*  MCV 88.7  PLT 0000000   Basic Metabolic Panel: Recent Labs  Lab 07/27/19 1729  NA 135  K 3.8  CL 105  CO2 22  GLUCOSE 109*   BUN 25*  CREATININE 0.78  CALCIUM 9.8   GFR: Estimated Creatinine Clearance: 61.9 mL/min (by C-G formula based on SCr of 0.78 mg/dL). Liver Function Tests: Recent Labs  Lab 07/27/19 1729  AST 20  ALT 22  ALKPHOS 69  BILITOT 0.6  PROT 5.9*  ALBUMIN 3.5   No results for input(s): LIPASE, AMYLASE in the last 168 hours. No results for input(s): AMMONIA in the last 168 hours. Coagulation Profile: No results for input(s): INR, PROTIME in the last 168 hours.  Cardiac Enzymes: No results for input(s): CKTOTAL, CKMB, CKMBINDEX, TROPONINI in the last 168 hours. BNP (last 3 results) No results for input(s): PROBNP in the last 8760 hours. HbA1C: No results for input(s): HGBA1C in the last 72 hours. CBG: No results for input(s): GLUCAP in the last 168 hours. Lipid Profile: No results for input(s): CHOL, HDL, LDLCALC, TRIG, CHOLHDL, LDLDIRECT in the last 72 hours. Thyroid Function Tests: No results for input(s): TSH, T4TOTAL, FREET4, T3FREE, THYROIDAB in the last 72 hours. Anemia Panel: No results for input(s): VITAMINB12, FOLATE, FERRITIN, TIBC, IRON, RETICCTPCT in the last 72 hours. Urine analysis:    Component Value Date/Time   COLORURINE YELLOW 06/16/2012 1025   APPEARANCEUR CLOUDY (A) 06/16/2012 1025   LABSPEC 1.025 06/16/2012 1025   PHURINE 5.5 06/16/2012 1025   GLUCOSEU NEGATIVE 06/16/2012 1025   HGBUR NEGATIVE 06/16/2012 1025   [place] 06/16/2012 1025   KETONESUR NEGATIVE 06/16/2012 1025   PROTEINUR NEGATIVE 06/16/2012 1025   UROBILINOGEN 0.2 06/16/2012 1025   NITRITE NEGATIVE 06/16/2012 1025   LEUKOCYTESUR MODERATE (A) 06/16/2012 1025   Sepsis Labs: No results found for this or any previous visit (from the past 240 hour(s)).   Radiological Exams on Admission: Dg Chest 2 View  Result Date: 07/27/2019 CLINICAL DATA:  Pt came in with sob and a low blood count,hemoglobin yesterday was 8, has had a past ulcer in her hiatal hernia, and today she is hoping  to be admitted for further testing, hx non smoker, no known heart hx, no htn or diabetes EXAM: CHEST - 2 VIEW COMPARISON:  11/29/2015 FINDINGS: Cardiac silhouette is normal in size. Moderate to large hiatal hernia. No mediastinal or hilar masses or evidence of adenopathy. Clear lungs.  No pleural effusion or pneumothorax. Skeletal structures are intact. IMPRESSION: No active cardiopulmonary disease. Electronically Signed   By: Lajean Manes M.D.   On: 07/27/2019 18:12    Chest x-ray: Independently reviewed.  No acute abnormalities noted  Assessment/Plan Symptomatic anemia secondary to GI bleed: Acute.  Patient presents with complaints of fatigue and shortness of breath.  O2 saturations maintained on room air.  There was clear.  Hemoglobin noted to drop from 8 yesterday down to 7.1 today.  Labs also noted to have elevated BUN given concern for upper GI bleed.  Previous camera endoscopy back in May reportedly show 2 small AVMs could be because of current bleeding.  Patient not on any blood thinners or aspirin.  Patient GI doctor Dr. Earlean Shawl recommended CT angiogram of the abdomen and pelvis to try and locate source of bleeding. -Admit to a MedSurg bed  -Clear liquid diet, and n.p.o. after midnight -Continue with transfusion of 2 units of packed red blood cells -Protonix drip per protocol discontinued at patient's request due to her previous of being told that it would worsen bleeding -[facility] consulted, will follow-up for further recommendations including CT angiogram after their formal evaluation. -IR consultation may be warranted  Hyperlipidemia -Continue pravastatin  Insomnia -Continue doxepin  GERD/large hiatal hernia: As noted per previous EGD in May.  Patient not on Protonix as reports  OSA: Patient currently utilizes a specialty mouthpiece made by her dentist.  Obesity: BMI 33.84 kg/m  DVT prophylaxis:SCDs Code Status: Full  Family Communication: No family present at bedside  Disposition Plan: Likely discharge home in 1 to 2 days Consults called: GI Admission status: observation  Norval Morton MD [facility] Pager [phone]   If 7PM-7AM, please contact night-coverage [email or web address] Password [facility]  07/28/2019,  8:08 AM

## 2019-07-28 NOTE — Plan of Care (Signed)
  Problem: Education: Goal: Knowledge of General Education information will improve Description Including pain rating scale, medication(s)/side effects and non-pharmacologic comfort measures Outcome: Progressing   

## 2019-07-29 DIAGNOSIS — Z8262 Family history of osteoporosis: Secondary | ICD-10-CM | POA: Diagnosis not present

## 2019-07-29 DIAGNOSIS — E785 Hyperlipidemia, unspecified: Secondary | ICD-10-CM | POA: Diagnosis not present

## 2019-07-29 DIAGNOSIS — D649 Anemia, unspecified: Secondary | ICD-10-CM | POA: Diagnosis not present

## 2019-07-29 DIAGNOSIS — K449 Diaphragmatic hernia without obstruction or gangrene: Secondary | ICD-10-CM | POA: Diagnosis present

## 2019-07-29 DIAGNOSIS — K921 Melena: Secondary | ICD-10-CM | POA: Diagnosis not present

## 2019-07-29 DIAGNOSIS — K922 Gastrointestinal hemorrhage, unspecified: Secondary | ICD-10-CM | POA: Diagnosis not present

## 2019-07-29 DIAGNOSIS — G4733 Obstructive sleep apnea (adult) (pediatric): Secondary | ICD-10-CM | POA: Diagnosis not present

## 2019-07-29 DIAGNOSIS — Z96642 Presence of left artificial hip joint: Secondary | ICD-10-CM | POA: Diagnosis present

## 2019-07-29 DIAGNOSIS — K5521 Angiodysplasia of colon with hemorrhage: Secondary | ICD-10-CM | POA: Diagnosis present

## 2019-07-29 DIAGNOSIS — Z8261 Family history of arthritis: Secondary | ICD-10-CM | POA: Diagnosis not present

## 2019-07-29 DIAGNOSIS — Z880 Allergy status to penicillin: Secondary | ICD-10-CM | POA: Diagnosis not present

## 2019-07-29 DIAGNOSIS — Z6837 Body mass index (BMI) 37.0-37.9, adult: Secondary | ICD-10-CM | POA: Diagnosis not present

## 2019-07-29 DIAGNOSIS — I10 Essential (primary) hypertension: Secondary | ICD-10-CM | POA: Diagnosis present

## 2019-07-29 DIAGNOSIS — G4700 Insomnia, unspecified: Secondary | ICD-10-CM | POA: Diagnosis present

## 2019-07-29 DIAGNOSIS — K219 Gastro-esophageal reflux disease without esophagitis: Secondary | ICD-10-CM | POA: Diagnosis present

## 2019-07-29 DIAGNOSIS — E669 Obesity, unspecified: Secondary | ICD-10-CM | POA: Diagnosis present

## 2019-07-29 DIAGNOSIS — Z20828 Contact with and (suspected) exposure to other viral communicable diseases: Secondary | ICD-10-CM | POA: Diagnosis present

## 2019-07-29 DIAGNOSIS — Z8249 Family history of ischemic heart disease and other diseases of the circulatory system: Secondary | ICD-10-CM | POA: Diagnosis not present

## 2019-07-29 DIAGNOSIS — D62 Acute posthemorrhagic anemia: Secondary | ICD-10-CM | POA: Diagnosis present

## 2019-07-29 LAB — TYPE AND SCREEN
ABO/RH(D): O POS
Antibody Screen: POSITIVE
Unit division: 0
Unit division: 0

## 2019-07-29 LAB — BPAM RBC
Blood Product Expiration Date: 202010132359
Blood Product Expiration Date: 202010132359
ISSUE DATE / TIME: 202009180851
ISSUE DATE / TIME: 202009181155
Unit Type and Rh: 5100
Unit Type and Rh: 5100

## 2019-07-29 LAB — CBC
HCT: 28.8 % — ABNORMAL LOW (ref 36.0–46.0)
Hemoglobin: 9 g/dL — ABNORMAL LOW (ref 12.0–15.0)
MCH: 27 pg (ref 26.0–34.0)
MCHC: 31.3 g/dL (ref 30.0–36.0)
MCV: 86.5 fL (ref 80.0–100.0)
Platelets: 249 10*3/uL (ref 150–400)
RBC: 3.33 MIL/uL — ABNORMAL LOW (ref 3.87–5.11)
RDW: 19 % — ABNORMAL HIGH (ref 11.5–15.5)
WBC: 5.4 10*3/uL (ref 4.0–10.5)
nRBC: 0 % (ref 0.0–0.2)

## 2019-07-29 LAB — BASIC METABOLIC PANEL
Anion gap: 7 (ref 5–15)
BUN: 11 mg/dL (ref 8–23)
CO2: 26 mmol/L (ref 22–32)
Calcium: 9.4 mg/dL (ref 8.9–10.3)
Chloride: 107 mmol/L (ref 98–111)
Creatinine, Ser: 0.7 mg/dL (ref 0.44–1.00)
GFR calc Af Amer: >60 mL/min (ref >60)
GFR calc non Af Amer: >60 mL/min (ref >60)
Glucose, Bld: 103 mg/dL — ABNORMAL HIGH (ref 70–99)
Potassium: 4.1 mmol/L (ref 3.5–5.1)
Sodium: 140 mmol/L (ref 135–145)

## 2019-07-29 NOTE — Progress Notes (Signed)
[facility] Gastroenterology Progress Note  Molly Lucas [place] 73 y.o. [birth year]  CC: Recurrent/symptomatic anemia   Subjective: No further bleeding episodes.  Last bleeding episode 3 days ago.  Denies abdominal pain, nausea and vomiting.  Feeling hungry  ROS : Negative for chest pain and shortness of breath   Objective: Vital signs in last 24 hours: Vitals:   07/29/19 0404 07/29/19 0404  BP: 126/71 126/71  Pulse: 81 76  Resp: 18 18  Temp: 98.7 F (37.1 C) 98.7 F (37.1 C)  SpO2: 93% 94%    Physical Exam:  General:  Alert, cooperative, no distress, appears stated age  Head:  Normocephalic, without obvious abnormality, atraumatic  Eyes:  , EOM's intact,   Lungs:   Clear to auscultation bilaterally, respirations unlabored  Heart:  Regular rate and rhythm, S1, S2 normal  Abdomen:   Soft, non-tender, bowel sounds active all four quadrants,  no masses,   Extremities: Extremities normal, atraumatic, no  Edema, arthritis changes noted in upper extremities  Pulses: 2+ and symmetric    Lab Results: Recent Labs    07/27/19 1729 07/29/19 0657  NA 135 140  K 3.8 4.1  CL 105 107  CO2 22 26  GLUCOSE 109* 103*  BUN 25* 11  CREATININE 0.78 0.70  CALCIUM 9.8 9.4   Recent Labs    07/27/19 1729  AST 20  ALT 22  ALKPHOS 69  BILITOT 0.6  PROT 5.9*  ALBUMIN 3.5   Recent Labs    07/27/19 1729 07/28/19 1922 07/29/19 0657  WBC 8.9  --  5.4  NEUTROABS 6.7  --   --   HGB 7.1* 10.3* 9.0*  HCT 23.6* 32.2* 28.8*  MCV 88.7  --  86.5  PLT 293  --  249   No results for input(s): LABPROT, INR in the last 72 hours.    Assessment/Plan: -Recurrent anemia.  Previous EGD showed large hiatal hernia.  Negative colonoscopy.  Patient with intermittent melena.  This could be from Cameron's erosions.  Bleeding scan negative yesterday.  No bleeding in last 3 days. -Symptomatic anemia.  Resolved.  Status post blood transfusion. - Large hiatal hernia with approximately half the stomach  within the posterior mediastinum behind the heart as per CT scan in April 2019  Recommendations ------------------------ -Given negative bleeding scan, do not recommend CT angiography at this time particularly in absence of active GI bleeding.  -She may benefit from low-dose PPI use to prevent Cameron ulcers.  According to patient, her primary GI doctor has asked her not to use PPI.  I have advised patient to discuss with his primary GI doctor about this issue  -Recommend doing CT angiography during episode of active bleeding. -Start soft diet.  GI will sign off.  Call us back as needed.  -Recommend repeating CBC every 2 weeks until she stabilized her hemoglobin. -Recommend close follow-up with primary GI Dr. Thana Farr and hematology.  Patient had multiple questions today which were answered   Otis Brace MD, [place] 07/29/2019, 12:45 PM  Contact #  [phone]

## 2019-07-29 NOTE — Plan of Care (Signed)
  Problem: Education: Goal: Knowledge of General Education information will improve Description Including pain rating scale, medication(s)/side effects and non-pharmacologic comfort measures Outcome: Adequate for Discharge   Problem: Health Behavior/Discharge Planning: Goal: Ability to manage health-related needs will improve Outcome: Adequate for Discharge   Problem: Clinical Measurements: Goal: Ability to maintain clinical measurements within normal limits will improve Outcome: Adequate for Discharge Goal: Will remain free from infection Outcome: Adequate for Discharge Goal: Diagnostic test results will improve Outcome: Adequate for Discharge Goal: Respiratory complications will improve Outcome: Adequate for Discharge Goal: Cardiovascular complication will be avoided Outcome: Adequate for Discharge   Problem: Activity: Goal: Risk for activity intolerance will decrease Outcome: Adequate for Discharge   Problem: Nutrition: Goal: Adequate nutrition will be maintained Outcome: Adequate for Discharge   Problem: Coping: Goal: Level of anxiety will decrease Outcome: Adequate for Discharge   Problem: Pain Managment: Goal: General experience of comfort will improve Outcome: Adequate for Discharge   Problem: Safety: Goal: Ability to remain free from injury will improve Outcome: Adequate for Discharge   

## 2019-07-29 NOTE — Discharge Summary (Signed)
Discharge Summary  Molly Lucas [facility] [id number] DOB: 26-Nov-1945  PCP: [name], MD  Admit date: 07/27/2019 Discharge date: 07/29/2019  Time spent: 40 mins  Recommendations for Outpatient Follow-up:  1. PCP in 1 week for repeat labs 2. Follow-up with patient's GI as scheduled  Discharge Diagnoses:  Active Hospital Problems   Diagnosis Date Noted  . Symptomatic anemia 11/29/2015  . GI bleed 03/29/2019  . OSA (obstructive sleep apnea) 06/20/2018  . HLD (hyperlipidemia) 02/26/2016    Resolved Hospital Problems  No resolved problems to display.    Discharge Condition: Stable  Diet recommendation: Heart healthy  Vitals:   07/29/19 0404 07/29/19 0404  BP: 126/71 126/71  Pulse: 81 76  Resp: 18 18  Temp: 98.7 F (37.1 C) 98.7 F (37.1 C)  SpO2: 93% 94%    History of present illness:  Molly Lucas is a 73 y.o. female with medical history significant of hypertension, hyperlipidemia, arthritis, GI bleed secondary to AVM, osteoarthritis, OSA, and GERD; who presents to the ED, after her primary care provider found abnormal labs.  She reports that her hemoglobin was around 11 g/dL at the beginning of this month.  However, over the last 5 days she has been begun feeling more short of breath with exertion. Pt reported having dark stools, about 2-3 episode. Denies being on any aspirin, NSAIDs, or blood thinners.  She was told previously by her GI doctor not to take ??Protonix as it could worsen bleeding. Patient is followed by Dr. Earlean Shawl at [facility]. In May, patient reports having a camera endoscopy which showed 2 small AVMs, and later had a enteroscopy in August did not show any acute as the cause for her bleeding. In the ED, hemoglobin 7.1, BUN 25, and creatinine 0.78. Stools were reported to be melanotic. FOBT positive.  Patient admitted for further management.   Today, patient denies any new complaints, denies any abdominal pain, melena, bright  red blood per rectum, nausea/vomiting, fever/chills, chest pain, shortness of breath, dizziness.  Patient stable to be discharged with close follow-up with PCP with repeat labs in 1 week and patient's GI at [facility] for follow-up.   Hospital Course:  Principal Problem:   Symptomatic anemia Active Problems:   HLD (hyperlipidemia)   OSA (obstructive sleep apnea)   GI bleed  Symptomatic acute on chronic blood loss secondary to GI bleed, likely AVMs Hemoglobin 7.1 on admission Hemoglobin somewhat stable after transfusion of 2 PRBC Previous camera endoscopy back in May reportedly show 2 small AVMs  [facility] consulted, recommended a bleeding scan, which was negative.  GI did not recommend CT angiography at this time as there was no active bleeding.  GI recommended a low-dose PPI, but according to patient, her primary GI asked her not to use PPI as it may worsen her bleeding Advised to follow-up with her PCP in 1 week with repeat labs (GI recommends repeat CBC every 2 weeks until hemoglobin stabilized) Advised to follow-up with her GI doctor at [facility]  Hyperlipidemia Continue pravastatin  Insomnia Continue doxepin  GERD/large hiatal hernia As noted per previous EGD in May Patient not on Protonix as reports  OSA Patient currently utilizes a specialty mouthpiece made by her dentist.  Obesity BMI 33.84 kg/m          Malnutrition Type:      Malnutrition Characteristics:      Nutrition Interventions:      Estimated body mass index is 37.6 kg/m as calculated  from the following:   Height as of this encounter: 5\' 2"  (1.575 m).   Weight as of this encounter: 93.3 kg.    Procedures:  None  Consultations:  GI  Discharge Exam: BP 126/71 (BP Location: Left Arm)   Pulse 76   Temp 98.7 F (37.1 C) (Oral)   Resp 18   Ht 5\' 2"  (1.575 m)   Wt 93.3 kg   SpO2 94%   BMI 37.60 kg/m   General: NAD Cardiovascular: S1, S2 present  Respiratory: CTAB   Discharge Instructions You were cared for by a hospitalist during your hospital stay. If you have any questions about your discharge medications or the care you received while you were in the hospital after you are discharged, you can call the unit and asked to speak with the hospitalist on call if the hospitalist that took care of you is not available. Once you are discharged, your primary care physician will handle any further medical issues. Please note that NO REFILLS for any discharge medications will be authorized once you are discharged, as it is imperative that you return to your primary care physician (or establish a relationship with a primary care physician if you do not have one) for your aftercare needs so that they can reassess your need for medications and monitor your lab values.   Allergies as of 07/29/2019      Reactions   Penicillins Swelling, Other (See Comments)   Tongue became swollen and turned black Did it involve swelling of the face/tongue/throat, SOB, or low BP? Yes Did it involve sudden or severe rash/hives, skin peeling, or any reaction on the inside of your mouth or nose? Yes Did you need to seek medical attention at a hospital or doctor's office? Yes When did it last happen? "I was a child" If all above answers are "NO", may proceed with cephalosporin use.      Medication List    TAKE these medications   CALCIUM PO Take 2 tablets by mouth daily.   cetirizine 10 MG tablet Commonly known as: ZYRTEC Take 10 mg by mouth daily.   Cinnamon 500 MG capsule Take 1,000 mg by mouth daily.   DULoxetine 60 MG capsule Commonly known as: CYMBALTA Take 60 mg by mouth at bedtime.   fluticasone 50 MCG/ACT nasal spray Commonly known as: FLONASE Place 2 sprays into both nostrils daily.   HAIR SKIN AND NAILS FORMULA PO Take 1 tablet by mouth daily.   multivitamin-lutein Caps capsule Take 1 capsule by mouth daily.   NON FORMULARY Take 1 capsule by mouth See admin  instructions. Black Cumiun oil capsules: Take 1 capsule by mouth once a day   Omega-3 1000 MG Caps Take 2,000 mg by mouth daily.   pravastatin 20 MG tablet Commonly known as: PRAVACHOL Take 20 mg by mouth at bedtime.   Silenor 6 MG Tabs Generic drug: Doxepin HCl Take 6 mg by mouth at bedtime.   Turmeric 500 MG Caps Take 500 mg by mouth daily.   vitamin C 500 MG tablet Commonly known as: ASCORBIC ACID Take 500 mg by mouth daily.   Vitamin D3 250 MCG (10000 UT) Tabs Take 10,000 Units by mouth daily.      Allergies  Allergen Reactions  . Penicillins Swelling and Other (See Comments)    Tongue became swollen and turned black Did it involve swelling of the face/tongue/throat, SOB, or low BP? Yes Did it involve sudden or severe rash/hives, skin peeling, or any reaction  on the inside of your mouth or nose? Yes Did you need to seek medical attention at a hospital or doctor's office? Yes When did it last happen? "I was a child" If all above answers are "NO", may proceed with cephalosporin use.    Follow-up Information    [name], MD. Schedule an appointment as soon as possible for a visit in 1 week(s).   Specialty: Internal Medicine Contact information: [place] [address] [phone]            The results of significant diagnostics from this hospitalization (including imaging, microbiology, ancillary and laboratory) are listed below for reference.    Significant Diagnostic Studies: Dg Chest 2 View  Result Date: 07/27/2019 CLINICAL DATA:  Pt came in with sob and a low blood count,hemoglobin yesterday was 8, has had a past ulcer in her hiatal hernia, and today she is hoping to be admitted for further testing, hx non smoker, no known heart hx, no htn or diabetes EXAM: CHEST - 2 VIEW COMPARISON:  11/29/2015 FINDINGS: Cardiac silhouette is normal in size. Moderate to large hiatal hernia. No mediastinal or hilar masses or evidence of  adenopathy. Clear lungs.  No pleural effusion or pneumothorax. Skeletal structures are intact. IMPRESSION: No active cardiopulmonary disease. Electronically Signed   By: Lajean Manes M.D.   On: 07/27/2019 18:12   Nm Gi Blood Loss  Result Date: 07/28/2019 CLINICAL DATA:  GI bleed. EXAM: NUCLEAR MEDICINE GASTROINTESTINAL BLEEDING SCAN TECHNIQUE: Sequential abdominal images were obtained following intravenous administration of Tc-30m labeled red blood cells. RADIOPHARMACEUTICALS:  25.2 mCi Tc-87m pertechnetate in-vitro labeled red cells. COMPARISON:  None. FINDINGS: Normal radiotracer distribution is noted. There is no pooling of radiotracer within the bowel. IMPRESSION: No active GI bleeding identified on this exam. Electronically Signed   By: Constance Holster M.D.   On: 07/28/2019 19:49    Microbiology: Recent Results (from the past 240 hour(s))  SARS CORONAVIRUS 2 (TAT 6-24 HRS) Nasopharyngeal Nasopharyngeal Swab     Status: None   Collection Time: 07/28/19  7:33 AM   Specimen: Nasopharyngeal Swab  Result Value Ref Range Status   SARS Coronavirus 2 NEGATIVE NEGATIVE Final    Comment: (NOTE) SARS-CoV-2 target nucleic acids are NOT DETECTED. The SARS-CoV-2 RNA is generally detectable in upper and lower respiratory specimens during the acute phase of infection. Negative results do not preclude SARS-CoV-2 infection, do not rule out co-infections with other pathogens, and should not be used as the sole basis for treatment or other patient management decisions. Negative results must be combined with clinical observations, patient history, and epidemiological information. The expected result is Negative. Fact Sheet for Patients: SugarRoll.be Fact Sheet for Healthcare Providers: https://www.woods-mathews.com/ This test is not yet approved or cleared by the [place] FDA and  has been authorized for detection and/or diagnosis of SARS-CoV-2 by FDA  under an Emergency Use Authorization (EUA). This EUA will remain  in effect (meaning this test can be used) for the duration of the COVID-19 declaration under Section 56 4(b)(1) of the Act, 21 U.S.C. section 360bbb-3(b)(1), unless the authorization is terminated or revoked sooner. Performed at [facility], [place] [address], [place], [place] [address]      Labs: Basic Metabolic Panel: Recent Labs  Lab 07/27/19 1729 07/29/19 0657  NA 135 140  K 3.8 4.1  CL 105 107  CO2 22 26  GLUCOSE 109* 103*  BUN 25* 11  CREATININE 0.78 0.70  CALCIUM 9.8 9.4  Liver Function Tests: Recent Labs  Lab 07/27/19 1729  AST 20  ALT 22  ALKPHOS 69  BILITOT 0.6  PROT 5.9*  ALBUMIN 3.5   No results for input(s): LIPASE, AMYLASE in the last 168 hours. No results for input(s): AMMONIA in the last 168 hours. CBC: Recent Labs  Lab 07/27/19 1729 07/28/19 1922 07/29/19 0657  WBC 8.9  --  5.4  NEUTROABS 6.7  --   --   HGB 7.1* 10.3* 9.0*  HCT 23.6* 32.2* 28.8*  MCV 88.7  --  86.5  PLT 293  --  249   Cardiac Enzymes: No results for input(s): CKTOTAL, CKMB, CKMBINDEX, TROPONINI in the last 168 hours. BNP: BNP (last 3 results) No results for input(s): BNP in the last 8760 hours.  ProBNP (last 3 results) No results for input(s): PROBNP in the last 8760 hours.  CBG: No results for input(s): GLUCAP in the last 168 hours.     Signed:  Alma Friendly, MD [facility] 07/29/2019, 2:42 PM

## 2019-07-31 ENCOUNTER — Telehealth: Payer: Self-pay | Admitting: Hematology

## 2019-07-31 NOTE — Telephone Encounter (Signed)
Patient called requesting appointment with Dr. Irene Limbo due to worsening anemia. Per patient she has had 4 recent hospitalizations within our system and one with [facility]. Per patient Dr. Earlean Shawl was to contact our office. Confirmed with patient f/u for 9/24. Rescheduled April 2019 office visit.

## 2019-08-02 DIAGNOSIS — Z9189 Other specified personal risk factors, not elsewhere classified: Secondary | ICD-10-CM | POA: Diagnosis not present

## 2019-08-02 DIAGNOSIS — Z20828 Contact with and (suspected) exposure to other viral communicable diseases: Secondary | ICD-10-CM | POA: Diagnosis not present

## 2019-08-02 NOTE — Progress Notes (Signed)
HEMATOLOGY/ONCOLOGY CONSULTATION NOTE  Date of Service: 08/02/2019  Patient Care Team: [name], MD as PCP - General (Internal Medicine)  CHIEF COMPLAINTS/PURPOSE OF CONSULTATION:   Anemia   HISTORY OF PRESENTING ILLNESS:  Molly Lucas is a wonderful 73 y.o. female who has been referred to [place] by her PCP Dr. Bevelyn Buckles for evaluation and management of weight loss with elevated Hemoglobin and Hematocrit and hypercalcemia. She presents to the clinic today accompanied by her son.   She notes she has lost weight recently but this was due to her trouble eating. She has seen GI Dr. Thana Farr and had a endoscopy last week which showed a hiatal hernia which made it hard to swallow. A biopsy was done as well. She was given Prilosec and her eating has recently improved. She has had 3 meals in the past few days. For the past few months she had no appetite. She has lost 15 pounds over the last 2 months.  She notes she is prone to being anemic. She has had anemia episodes 3 times in the past few years. First time in 2014 she was treated with IV Feraheme. Another time in 11/2015 she had a ulcer with GI bleeding and her Hg went down to 4 and she was given blood transfusion. In 10/2017 she her Hg went down to again and was again treated with IV Feraheme. Her last colonoscopy was 2 years ago and was clear from polyps or concerns. She notes having black stool only once since her ulcer due to Pepto bismol. She notes she has not been on oral calcium for 2-3 months. She denies back pain, bone pain or kidney stones.   Today she notes family stress. She has had a mammogram in the last 2 years. She now lives in a retirement community.  She notes she had fluid around her heart once and went to see cardiologist who cleared her.  On review of symptoms, pt notes tightness in chest resolved with Prilosec. She denies back pain or bone pain. She has decreased appetite still. She notes since not eating much  she has felt feverish and moderately sweaty. This has not occurred in the past few days. She notes her bowel movements are random because she is not currently eating much. She is ale to pass urine fine. She denies cough, chest pain or difficulty breathing. She notes stress fracture b/w L3 and L4 which has arthritis around it.    INTERVAL HISTORY:  Molly Lucas is a 73 y.o. female here for evaluation and management of anemia. The patient's last visit with [place] was on 02/01/2018. The pt reports that she is doing well overall.  The pt reports that she had out patient IV iron in August. She had modified double balloon by Dr. Rolin Barry at [facility] mentioned she was advised to follow an iron rich diet. She also reports SOB with exercise.   Of note since the patient's last visit, pt has had a C/A/P CT completed on 02/17/2018 with results revealing no thoracic adenopathy or malignancy identified. Large hiatal hernia with half the stomach above the hemidiaphragm. No hepatosplenomegaly. No lymphadenopathy. No aggressive osseous lesion.  No plasmacytoma.  Pt had whole body bone scan completed on 02/17/2018 with results revealing Mild increased activity thoracic and lumbar spine and both knees, most likely degenerative. No other focal abnormalities identified.   She began having significant decreases in her HGB, first measured on 03/28/2019 at 6.6.  Lab Results  Component  Value Date   HGB 9.0 (L) 07/29/2019   HGB 10.3 (L) 07/28/2019   HGB 7.1 (L) 07/27/2019   HGB 9.4 (L) 05/20/2019   HGB 5.5 (LL) 05/19/2019   HGB 7.9 (L) 03/31/2019   HGB 7.8 (L) 03/30/2019   HGB 8.0 (L) 03/30/2019   HGB 6.3 (LL) 03/29/2019   HGB 6.6 (LL) 03/28/2019   She underwent 3 blood transfusions; one on 04/03/2019, 05/22/2019, and 07/31/2019.   Endoscopy on 03/30/2019 and Colonoscopy on 03/31/2019 revealed no sources of anemia.    On review of systems, pt reports slight dizziness and denies abdominal pain and any  other symptoms.   MEDICAL HISTORY:  Past Medical History:  Diagnosis Date   Anemia    Takes iron supplement   Anxiety    Arthritis    Chronic pain of left thumb    Constipation    Deformity of left thumb joint    Z collapse deformity to nondominant left thumb   Depression    Fibroid, uterine    GERD (gastroesophageal reflux disease)    History of blood transfusion    Hx of seasonal allergies    Hyperlipemia    Hypertension    hx of off medication now.   OA (osteoarthritis) of hip    left   Sleep apnea    dentist made mouthipiece     SURGICAL HISTORY: Past Surgical History:  Procedure Laterality Date   BIOPSY  03/30/2019   Procedure: BIOPSY;  Surgeon: Thornton Park, MD;  Location: [place];  Service: Gastroenterology;;   [place]  08/2015   with abductor pollicis longus tendon transfer and suspensionplasty   COLONOSCOPY WITH PROPOFOL N/A 03/31/2019   Procedure: COLONOSCOPY WITH PROPOFOL;  Surgeon: Thornton Park, MD;  Location: [place];  Service: Gastroenterology;  Laterality: N/A;   DE QUERVAIN'S RELEASE  08/2015   DILATATION & CURRETTAGE/HYSTEROSCOPY WITH RESECTOCOPE  2007   ESOPHAGOGASTRODUODENOSCOPY N/A 11/30/2015   Procedure: ESOPHAGOGASTRODUODENOSCOPY (EGD);  Surgeon: Richmond Campbell, MD;  Location: [facility];  Service: Endoscopy;  Laterality: N/A;   ESOPHAGOGASTRODUODENOSCOPY (EGD) WITH PROPOFOL N/A 03/30/2019   Procedure: ESOPHAGOGASTRODUODENOSCOPY (EGD) WITH PROPOFOL;  Surgeon: Thornton Park, MD;  Location: [place];  Service: Gastroenterology;  Laterality: N/A;   HERNIA REPAIR Left 06/2012   INGUINAL HERNIA REPAIR Right 05/03/2013   Procedure: HERNIA REPAIR INGUINAL ADULT;  Surgeon: Harl Bowie, MD;  Location: [place];  Service: General;  Laterality: Right;   INGUINAL HERNIA REPAIR Right 08/23/2013   Procedure: LAPAROSCOPIC INGUINAL HERNIA;  Surgeon: Harl Bowie, MD;  Location: [facility];  Service: General;  Laterality: Right;   INSERTION OF MESH Right 05/03/2013   Procedure: INSERTION OF MESH;  Surgeon: Harl Bowie, MD;  Location: [place];  Service: General;  Laterality: Right;   INSERTION OF MESH Right 08/23/2013   Procedure: INSERTION OF MESH;  Surgeon: Harl Bowie, MD;  Location: [facility];  Service: General;  Laterality: Right;   METACARPOPHALANGEAL JOINT ARTHRODESIS  08/2015   with local bone graft   TONSILLECTOMY     TOTAL HIP ARTHROPLASTY  06/24/2012   Procedure: TOTAL HIP ARTHROPLASTY ANTERIOR APPROACH;  Surgeon: Mcarthur Rossetti, MD;  Location: [facility];  Service: Orthopedics;  Laterality: Left;  Left total hip arthroplasty, anterior approach   UTERINE FIBROID SURGERY  2007   WISDOM TOOTH EXTRACTION      SOCIAL HISTORY: Social History   Socioeconomic History   Marital status: Widowed    Spouse name: Not on file  Number of children: Not on file   Years of education: Not on file   Highest education level: Not on file  Occupational History   Not on file  Social Needs   Financial resource strain: Not on file   Food insecurity    Worry: Not on file    Inability: Not on file   Transportation needs    Medical: Not on file    Non-medical: Not on file  Tobacco Use   Smoking status: Never Smoker   Smokeless tobacco: Never Used  Substance and Sexual Activity   Alcohol use: Yes    Alcohol/week: 2.0 standard drinks    Types: 2 Glasses of wine per week    Comment: occasional glass of wine    Drug use: No   Sexual activity: Not on file  Lifestyle   Physical activity    Days per week: Not on file    Minutes per session: Not on file   Stress: Not on file  Relationships   Social connections    Talks on phone: Not on file    Gets together: Not on file    Attends religious service: Not on file    Active member of club or organization: Not on file    Attends meetings of clubs or organizations: Not on file     Relationship status: Not on file   Intimate partner violence    Fear of current or ex partner: Not on file    Emotionally abused: Not on file    Physically abused: Not on file    Forced sexual activity: Not on file  Other Topics Concern   Not on file  Social History Narrative   Not on file    FAMILY HISTORY: Family History  Problem Relation Age of Onset   Heart failure Mother    Osteoporosis Mother    Arthritis Mother    CAD Maternal Grandfather    Heart failure Maternal Grandfather    Dementia Maternal Grandfather    CAD Paternal Grandmother    CAD Paternal Grandfather    Arthritis Sister     ALLERGIES:  is allergic to penicillins.  MEDICATIONS:  Current Outpatient Medications  Medication Sig Dispense Refill   CALCIUM PO Take 2 tablets by mouth daily.      cetirizine (ZYRTEC) 10 MG tablet Take 10 mg by mouth daily.     Cholecalciferol (VITAMIN D3) 250 MCG (10000 UT) TABS Take 10,000 Units by mouth daily.      Cinnamon 500 MG capsule Take 1,000 mg by mouth daily.      Doxepin HCl (SILENOR) 6 MG TABS Take 6 mg by mouth at bedtime.      DULoxetine (CYMBALTA) 60 MG capsule Take 60 mg by mouth at bedtime.      fluticasone (FLONASE) 50 MCG/ACT nasal spray Place 2 sprays into both nostrils daily.     Multiple Vitamins-Minerals (HAIR SKIN AND NAILS FORMULA PO) Take 1 tablet by mouth daily.     multivitamin-lutein (OCUVITE-LUTEIN) CAPS Take 1 capsule by mouth daily.     NON FORMULARY Take 1 capsule by mouth See admin instructions. Black Cumi[place] oil capsules: Take 1 capsule by mouth once a day     Omega-3 1000 MG CAPS Take 2,000 mg by mouth daily.      pravastatin (PRAVACHOL) 20 MG tablet Take 20 mg by mouth at bedtime.      Turmeric 500 MG CAPS Take 500 mg by mouth daily.  vitamin C (ASCORBIC ACID) 500 MG tablet Take 500 mg by mouth daily.     No current facility-administered medications for this visit.     REVIEW OF SYSTEMS: A 10+ POINT REVIEW  OF SYSTEMS WAS OBTAINED including neurology, dermatology, psychiatry, cardiac, respiratory, lymph, extremities, GI, GU, Musculoskeletal, constitutional, breasts, reproductive, HEENT.  All pertinent positives are noted in the HPI.  All others are negative.    PHYSICAL EXAMINATION: ECOG FS:2 - Symptomatic, <50% confined to bed  There were no vitals filed for this visit. Wt Readings from Last 3 Encounters:  07/29/19 205 lb 9.6 oz (93.3 kg)  06/19/19 185 lb (83.9 kg)  06/12/19 180 lb (81.6 kg)   There is no height or weight on file to calculate BMI.    GENERAL:alert, in no acute distress and comfortable SKIN: no acute rashes, no significant lesions EYES: conjunctiva are pink and non-injected, sclera anicteric OROPHARYNX: MMM, no exudates, no oropharyngeal erythema or ulceration NECK: supple, no JVD LYMPH:  no palpable lymphadenopathy in the cervical, axillary or inguinal regions LUNGS: clear to auscultation b/l with normal respiratory effort HEART: regular rate & rhythm ABDOMEN:  normoactive bowel sounds , non tender, not distended. Extremity: no pedal edema PSYCH: alert & oriented x 3 with fluent speech NEURO: no focal motor/sensory deficits   LABORATORY DATA:  I have reviewed the data as listed  . CBC Latest Ref Rng & Units 08/03/2019 07/29/2019 07/28/2019  WBC 4.0 - 10.5 K/uL 6.8 5.4 -  Hemoglobin 12.0 - 15.0 g/dL 9.7(L) 9.0(L) 10.3(L)  Hematocrit 36.0 - 46.0 % 32.3(L) 28.8(L) 32.2(L)  Platelets 150 - 400 K/uL 275 249 -    . CMP Latest Ref Rng & Units 07/29/2019 07/27/2019 05/20/2019  Glucose 70 - 99 mg/dL 103(H) 109(H) 95  BUN 8 - 23 mg/dL 11 25(H) 19  Creatinine 0.44 - 1.00 mg/dL 0.70 0.78 0.69  Sodium 135 - 145 mmol/L 140 135 141  Potassium 3.5 - 5.1 mmol/L 4.1 3.8 3.9  Chloride 98 - 111 mmol/L 107 105 111  CO2 22 - 32 mmol/L 26 22 23   Calcium 8.9 - 10.3 mg/dL 9.4 9.8 9.1  Total Protein 6.5 - 8.1 g/dL - 5.9(L) -  Total Bilirubin 0.3 - 1.2 mg/dL - 0.6 -  Alkaline Phos 38  - 126 U/L - 69 -  AST 15 - 41 U/L - 20 -  ALT 0 - 44 U/L - 22 -   . Lab Results  Component Value Date   IRON 19 (L) 08/03/2019   TIBC 468 (H) 08/03/2019   IRONPCTSAT 4 (L) 08/03/2019   (Iron and TIBC)  Lab Results  Component Value Date   FERRITIN 4 (L) 08/03/2019   Component     Latest Ref Rng & Units 02/01/2018  IgG (Immunoglobin G), Serum     700 - 1,600 mg/dL 864  IgA     64 - 422 mg/dL 194  IgM (Immunoglobulin M), Srm     26 - 217 mg/dL 71  Total Protein ELP     6.0 - 8.5 g/dL 6.8  Albumin SerPl Elph-Mcnc     2.9 - 4.4 g/dL 3.8  Alpha 1     0.0 - 0.4 g/dL 0.3  Alpha2 Glob SerPl Elph-Mcnc     0.4 - 1.0 g/dL 0.7  B-Globulin SerPl Elph-Mcnc     0.7 - 1.3 g/dL 1.1  Gamma Glob SerPl Elph-Mcnc     0.4 - 1.8 g/dL 0.9  M Protein SerPl Elph-Mcnc  Not Observed g/dL Not Observed  Globulin, Total     2.2 - 3.9 g/dL 3.0  Albumin/Glob SerPl     0.7 - 1.7 1.3  IFE 1      Comment  Please Note (HCV):      Comment  PTH, Intact     15 - 65 pg/mL 56  Calcium, Total (PTH)     8.7 - 10.3 mg/dL 10.9 (H)  PTH Interp      Comment  Kappa free light chain     3.3 - 19.4 mg/L 23.4 (H)  Lamda free light chains     5.7 - 26.3 mg/L 10.3  Kappa, lamda light chain ratio     0.26 - 1.65 2.27 (H)  Beta-2 Microglobulin     0.6 - 2.4 mg/L 2.9 (H)  Sed Rate     0 - 22 mm/hr 2  LDH     125 - 245 U/L 169    OUTSIDE LABS            With Ferritin of 5.7         RADIOGRAPHIC STUDIES: I have personally reviewed the radiological images as listed and agreed with the findings in the report. Dg Chest 2 View  Result Date: 07/27/2019 CLINICAL DATA:  Pt came in with sob and a low blood count,hemoglobin yesterday was 8, has had a past ulcer in her hiatal hernia, and today she is hoping to be admitted for further testing, hx non smoker, no known heart hx, no htn or diabetes EXAM: CHEST - 2 VIEW COMPARISON:  11/29/2015 FINDINGS: Cardiac silhouette is normal in size. Moderate  to large hiatal hernia. No mediastinal or hilar masses or evidence of adenopathy. Clear lungs.  No pleural effusion or pneumothorax. Skeletal structures are intact. IMPRESSION: No active cardiopulmonary disease. Electronically Signed   By: Lajean Manes M.D.   On: 07/27/2019 18:12   Nm Gi Blood Loss  Result Date: 07/28/2019 CLINICAL DATA:  GI bleed. EXAM: NUCLEAR MEDICINE GASTROINTESTINAL BLEEDING SCAN TECHNIQUE: Sequential abdominal images were obtained following intravenous administration of Tc-74m labeled red blood cells. RADIOPHARMACEUTICALS:  25.2 mCi Tc-102m pertechnetate in-vitro labeled red cells. COMPARISON:  None. FINDINGS: Normal radiotracer distribution is noted. There is no pooling of radiotracer within the bowel. IMPRESSION: No active GI bleeding identified on this exam. Electronically Signed   By: Constance Holster M.D.   On: 07/28/2019 19:49    ASSESSMENT & PLAN:  Molly Lucas is a 73 y.o. Caucasian female with  1. Severe iron deficiency Anemia due to GI bleeding  2. Hiatal Hernia with h/o cameron ulcers with previous recurrent Iron deficiency needing IV iron replacement in the past. -Found on GI workup and likely cause of GI bleeding leading to anemia.   3. Recurrent Anemia - secondary to GI blood loss -in 2014 treated with IV Feraheme, 2017 after GI ulcer and was treated with Blood transfusion, 11/2016 treated with IV Feraheme.   . Lab Results  Component Value Date   IRON 19 (L) 08/03/2019   TIBC 468 (H) 08/03/2019   IRONPCTSAT 4 (L) 08/03/2019   (Iron and TIBC)  Lab Results  Component Value Date   FERRITIN 4 (L) 08/03/2019   4. Weight loss - 15 pounds over 2 months -secondary to GI issues  -Improved eating with Prilosec 40mg  once a day  5. H/o Elevated Calcium at 11.7 rpt today improved to 11 PTH - WNL Myeloma panel - no M spike and neg IFE K/L ratio minimally  abnormal but only minimal increase in Kappa light chains B2MG  -borderline elevated.   PLAN:    -Discussed that her anemia and severe iron deficiency is related to  GI bleeding likely from AVM;s in small bowel. -this is bleeding issues not a primary hematologic disorder -Discussed  Need for close follow up with G.I. doctor and PCP for monitoring and mx of GI bleeding.. -she could discuss with GI regarding possible use of Sandostatin or antifibrinolytic to help control bleeding source control of bleeding not possible.. -Discussed negative history for heart attack and blood clot -Discussed possible starting Lysteda. -Recommended a vit. B complex and to return to the ER for active bleeds. -Reviewed what is addressed when receiving transfusion. -Advised to discuss physical activity and SOB set on by exercise with PCP. -Discussed IV Iron and labs once a month and to alternate labs with PCP.  -Labs today: Pending -Will see pt back in 2 months  FOLLOW UP: Labs today Plz schedule IV Injectafer weekly x 2 and then monthly x 3 Monthly labs, PRBC x 1 and IV INjectaferx 3 RTC with Dr Irene Limbo in 2 months  The total time spent in the appt was 25 minutes and more than 50% was on counseling and direct patient cares.  All of the patient's questions were answered with apparent satisfaction. The patient knows to call the clinic with any problems, questions or concerns.     Sullivan Lone MD [place] AAHIVMS [facility] Hematology/Oncology Physician [facility]  (Office):       [phone] (Work cell):  [phone] (Fax):           [phone]  08/02/2019 11:03 PM   I, Jacqualyn Posey am acting as a Education administrator for Chauncey Cruel, MD.   .I have reviewed the above documentation for accuracy and completeness, and I agree with the above. Brunetta Genera MD

## 2019-08-03 ENCOUNTER — Other Ambulatory Visit: Payer: Self-pay

## 2019-08-03 ENCOUNTER — Telehealth: Payer: Self-pay | Admitting: Hematology

## 2019-08-03 ENCOUNTER — Inpatient Hospital Stay: Payer: Medicare Other

## 2019-08-03 ENCOUNTER — Inpatient Hospital Stay: Payer: Medicare Other | Attending: Hematology | Admitting: Hematology

## 2019-08-03 VITALS — BP 140/79 | HR 92 | Temp 97.8°F | Resp 18 | Ht 62.0 in | Wt 205.1 lb

## 2019-08-03 DIAGNOSIS — Q2733 Arteriovenous malformation of digestive system vessel: Secondary | ICD-10-CM | POA: Diagnosis not present

## 2019-08-03 DIAGNOSIS — Z23 Encounter for immunization: Secondary | ICD-10-CM | POA: Diagnosis not present

## 2019-08-03 DIAGNOSIS — K219 Gastro-esophageal reflux disease without esophagitis: Secondary | ICD-10-CM | POA: Insufficient documentation

## 2019-08-03 DIAGNOSIS — E538 Deficiency of other specified B group vitamins: Secondary | ICD-10-CM

## 2019-08-03 DIAGNOSIS — K922 Gastrointestinal hemorrhage, unspecified: Secondary | ICD-10-CM

## 2019-08-03 DIAGNOSIS — G4730 Sleep apnea, unspecified: Secondary | ICD-10-CM | POA: Insufficient documentation

## 2019-08-03 DIAGNOSIS — Z8719 Personal history of other diseases of the digestive system: Secondary | ICD-10-CM | POA: Diagnosis not present

## 2019-08-03 DIAGNOSIS — E785 Hyperlipidemia, unspecified: Secondary | ICD-10-CM | POA: Diagnosis not present

## 2019-08-03 DIAGNOSIS — D500 Iron deficiency anemia secondary to blood loss (chronic): Secondary | ICD-10-CM

## 2019-08-03 DIAGNOSIS — M1612 Unilateral primary osteoarthritis, left hip: Secondary | ICD-10-CM | POA: Insufficient documentation

## 2019-08-03 DIAGNOSIS — Z79899 Other long term (current) drug therapy: Secondary | ICD-10-CM | POA: Insufficient documentation

## 2019-08-03 DIAGNOSIS — K449 Diaphragmatic hernia without obstruction or gangrene: Secondary | ICD-10-CM | POA: Insufficient documentation

## 2019-08-03 DIAGNOSIS — I10 Essential (primary) hypertension: Secondary | ICD-10-CM | POA: Diagnosis not present

## 2019-08-03 LAB — IRON AND TIBC
Iron: 19 ug/dL — ABNORMAL LOW (ref 41–142)
Saturation Ratios: 4 % — ABNORMAL LOW (ref 21–57)
TIBC: 468 ug/dL — ABNORMAL HIGH (ref 236–444)
UIBC: 448 ug/dL — ABNORMAL HIGH (ref 120–384)

## 2019-08-03 LAB — CBC WITH DIFFERENTIAL/PLATELET
Abs Immature Granulocytes: 0.02 10*3/uL (ref 0.00–0.07)
Basophils Absolute: 0.1 10*3/uL (ref 0.0–0.1)
Basophils Relative: 1 %
Eosinophils Absolute: 0.4 10*3/uL (ref 0.0–0.5)
Eosinophils Relative: 5 %
HCT: 32.3 % — ABNORMAL LOW (ref 36.0–46.0)
Hemoglobin: 9.7 g/dL — ABNORMAL LOW (ref 12.0–15.0)
Immature Granulocytes: 0 %
Lymphocytes Relative: 20 %
Lymphs Abs: 1.4 10*3/uL (ref 0.7–4.0)
MCH: 25.7 pg — ABNORMAL LOW (ref 26.0–34.0)
MCHC: 30 g/dL (ref 30.0–36.0)
MCV: 85.4 fL (ref 80.0–100.0)
Monocytes Absolute: 0.7 10*3/uL (ref 0.1–1.0)
Monocytes Relative: 11 %
Neutro Abs: 4.3 10*3/uL (ref 1.7–7.7)
Neutrophils Relative %: 63 %
Platelets: 275 10*3/uL (ref 150–400)
RBC: 3.78 MIL/uL — ABNORMAL LOW (ref 3.87–5.11)
RDW: 17.9 % — ABNORMAL HIGH (ref 11.5–15.5)
WBC: 6.8 10*3/uL (ref 4.0–10.5)
nRBC: 0 % (ref 0.0–0.2)

## 2019-08-03 LAB — VITAMIN B12: Vitamin B-12: 375 pg/mL (ref 180–914)

## 2019-08-03 LAB — FERRITIN: Ferritin: 4 ng/mL — ABNORMAL LOW (ref 11–307)

## 2019-08-03 NOTE — Telephone Encounter (Signed)
Scheduled appt per 9/24 los. ° °Printed calendar and avs. °

## 2019-08-09 DIAGNOSIS — F411 Generalized anxiety disorder: Secondary | ICD-10-CM | POA: Diagnosis not present

## 2019-08-10 DIAGNOSIS — D509 Iron deficiency anemia, unspecified: Secondary | ICD-10-CM | POA: Diagnosis not present

## 2019-08-10 DIAGNOSIS — I10 Essential (primary) hypertension: Secondary | ICD-10-CM | POA: Diagnosis not present

## 2019-08-10 DIAGNOSIS — R0602 Shortness of breath: Secondary | ICD-10-CM | POA: Diagnosis not present

## 2019-08-11 ENCOUNTER — Other Ambulatory Visit: Payer: Self-pay

## 2019-08-11 ENCOUNTER — Other Ambulatory Visit: Payer: Self-pay | Admitting: Hematology

## 2019-08-11 ENCOUNTER — Inpatient Hospital Stay: Payer: Medicare Other | Attending: Hematology

## 2019-08-11 VITALS — BP 140/86 | HR 94 | Temp 98.3°F | Resp 16

## 2019-08-11 DIAGNOSIS — Z8719 Personal history of other diseases of the digestive system: Secondary | ICD-10-CM | POA: Insufficient documentation

## 2019-08-11 DIAGNOSIS — D500 Iron deficiency anemia secondary to blood loss (chronic): Secondary | ICD-10-CM | POA: Diagnosis not present

## 2019-08-11 MED ORDER — ACETAMINOPHEN 325 MG PO TABS
650.0000 mg | ORAL_TABLET | Freq: Once | ORAL | Status: AC
  Administered 2019-08-11: 650 mg via ORAL

## 2019-08-11 MED ORDER — SODIUM CHLORIDE 0.9 % IV SOLN
750.0000 mg | Freq: Once | INTRAVENOUS | Status: AC
  Administered 2019-08-11: 750 mg via INTRAVENOUS
  Filled 2019-08-11: qty 15

## 2019-08-11 MED ORDER — ACETAMINOPHEN 325 MG PO TABS
ORAL_TABLET | ORAL | Status: AC
  Filled 2019-08-11: qty 2

## 2019-08-11 MED ORDER — LORATADINE 10 MG PO TABS
10.0000 mg | ORAL_TABLET | Freq: Once | ORAL | Status: AC
  Administered 2019-08-11: 10 mg via ORAL

## 2019-08-11 MED ORDER — SODIUM CHLORIDE 0.9 % IV SOLN
Freq: Once | INTRAVENOUS | Status: AC
  Administered 2019-08-11: 14:00:00 via INTRAVENOUS
  Filled 2019-08-11: qty 250

## 2019-08-11 MED ORDER — LORATADINE 10 MG PO TABS
ORAL_TABLET | ORAL | Status: AC
  Filled 2019-08-11: qty 1

## 2019-08-11 NOTE — Patient Instructions (Signed)

## 2019-08-18 ENCOUNTER — Inpatient Hospital Stay: Payer: Medicare Other

## 2019-08-18 ENCOUNTER — Other Ambulatory Visit: Payer: Self-pay

## 2019-08-18 VITALS — BP 124/82 | HR 86 | Temp 98.5°F | Resp 20

## 2019-08-18 DIAGNOSIS — D500 Iron deficiency anemia secondary to blood loss (chronic): Secondary | ICD-10-CM

## 2019-08-18 DIAGNOSIS — Z8719 Personal history of other diseases of the digestive system: Secondary | ICD-10-CM | POA: Diagnosis not present

## 2019-08-18 MED ORDER — SODIUM CHLORIDE 0.9 % IV SOLN
750.0000 mg | Freq: Once | INTRAVENOUS | Status: AC
  Administered 2019-08-18: 750 mg via INTRAVENOUS
  Filled 2019-08-18: qty 15

## 2019-08-18 MED ORDER — ACETAMINOPHEN 325 MG PO TABS
ORAL_TABLET | ORAL | Status: AC
  Filled 2019-08-18: qty 2

## 2019-08-18 MED ORDER — ACETAMINOPHEN 325 MG PO TABS
650.0000 mg | ORAL_TABLET | Freq: Once | ORAL | Status: AC
  Administered 2019-08-18: 650 mg via ORAL

## 2019-08-18 MED ORDER — SODIUM CHLORIDE 0.9 % IV SOLN
Freq: Once | INTRAVENOUS | Status: AC
  Administered 2019-08-18: 14:00:00 via INTRAVENOUS
  Filled 2019-08-18: qty 250

## 2019-08-18 MED ORDER — LORATADINE 10 MG PO TABS: 10.0000 mg | ORAL_TABLET | Freq: Once | ORAL | Status: DC

## 2019-08-18 NOTE — Patient Instructions (Signed)

## 2019-08-21 DIAGNOSIS — K922 Gastrointestinal hemorrhage, unspecified: Secondary | ICD-10-CM | POA: Diagnosis not present

## 2019-08-21 DIAGNOSIS — D500 Iron deficiency anemia secondary to blood loss (chronic): Secondary | ICD-10-CM | POA: Diagnosis not present

## 2019-08-23 DIAGNOSIS — F411 Generalized anxiety disorder: Secondary | ICD-10-CM | POA: Diagnosis not present

## 2019-09-13 ENCOUNTER — Other Ambulatory Visit: Payer: Self-pay | Admitting: Hematology

## 2019-09-15 DIAGNOSIS — D500 Iron deficiency anemia secondary to blood loss (chronic): Secondary | ICD-10-CM | POA: Diagnosis not present

## 2019-09-15 DIAGNOSIS — Z20828 Contact with and (suspected) exposure to other viral communicable diseases: Secondary | ICD-10-CM | POA: Diagnosis not present

## 2019-09-15 DIAGNOSIS — T80319A ABO incompatibility with hemolytic transfusion reaction, unspecified, initial encounter: Secondary | ICD-10-CM | POA: Diagnosis not present

## 2019-09-15 DIAGNOSIS — K922 Gastrointestinal hemorrhage, unspecified: Secondary | ICD-10-CM | POA: Diagnosis not present

## 2019-09-18 ENCOUNTER — Inpatient Hospital Stay: Payer: Medicare Other

## 2019-09-18 ENCOUNTER — Other Ambulatory Visit: Payer: Self-pay

## 2019-09-18 ENCOUNTER — Other Ambulatory Visit: Payer: Self-pay | Admitting: Hematology

## 2019-09-18 ENCOUNTER — Inpatient Hospital Stay: Payer: Medicare Other | Attending: Hematology

## 2019-09-18 VITALS — BP 136/88 | HR 71 | Temp 98.5°F | Resp 18

## 2019-09-18 DIAGNOSIS — Z79899 Other long term (current) drug therapy: Secondary | ICD-10-CM | POA: Insufficient documentation

## 2019-09-18 DIAGNOSIS — R42 Dizziness and giddiness: Secondary | ICD-10-CM | POA: Diagnosis not present

## 2019-09-18 DIAGNOSIS — K219 Gastro-esophageal reflux disease without esophagitis: Secondary | ICD-10-CM | POA: Insufficient documentation

## 2019-09-18 DIAGNOSIS — K921 Melena: Secondary | ICD-10-CM | POA: Diagnosis not present

## 2019-09-18 DIAGNOSIS — R5383 Other fatigue: Secondary | ICD-10-CM | POA: Insufficient documentation

## 2019-09-18 DIAGNOSIS — K254 Chronic or unspecified gastric ulcer with hemorrhage: Secondary | ICD-10-CM | POA: Diagnosis not present

## 2019-09-18 DIAGNOSIS — E785 Hyperlipidemia, unspecified: Secondary | ICD-10-CM | POA: Insufficient documentation

## 2019-09-18 DIAGNOSIS — D500 Iron deficiency anemia secondary to blood loss (chronic): Secondary | ICD-10-CM | POA: Diagnosis not present

## 2019-09-18 DIAGNOSIS — I10 Essential (primary) hypertension: Secondary | ICD-10-CM | POA: Insufficient documentation

## 2019-09-18 DIAGNOSIS — R768 Other specified abnormal immunological findings in serum: Secondary | ICD-10-CM | POA: Insufficient documentation

## 2019-09-18 DIAGNOSIS — G4730 Sleep apnea, unspecified: Secondary | ICD-10-CM | POA: Insufficient documentation

## 2019-09-18 DIAGNOSIS — R0609 Other forms of dyspnea: Secondary | ICD-10-CM | POA: Diagnosis not present

## 2019-09-18 DIAGNOSIS — M1612 Unilateral primary osteoarthritis, left hip: Secondary | ICD-10-CM | POA: Insufficient documentation

## 2019-09-18 DIAGNOSIS — K922 Gastrointestinal hemorrhage, unspecified: Secondary | ICD-10-CM

## 2019-09-18 LAB — CBC WITH DIFFERENTIAL/PLATELET
Abs Immature Granulocytes: 0.02 10*3/uL (ref 0.00–0.07)
Basophils Absolute: 0.1 10*3/uL (ref 0.0–0.1)
Basophils Relative: 1 %
Eosinophils Absolute: 0.3 10*3/uL (ref 0.0–0.5)
Eosinophils Relative: 5 %
HCT: 29.8 % — ABNORMAL LOW (ref 36.0–46.0)
Hemoglobin: 8.6 g/dL — ABNORMAL LOW (ref 12.0–15.0)
Immature Granulocytes: 0 %
Lymphocytes Relative: 21 %
Lymphs Abs: 1.3 10*3/uL (ref 0.7–4.0)
MCH: 25.7 pg — ABNORMAL LOW (ref 26.0–34.0)
MCHC: 28.9 g/dL — ABNORMAL LOW (ref 30.0–36.0)
MCV: 89.2 fL (ref 80.0–100.0)
Monocytes Absolute: 0.8 10*3/uL (ref 0.1–1.0)
Monocytes Relative: 12 %
Neutro Abs: 3.7 10*3/uL (ref 1.7–7.7)
Neutrophils Relative %: 61 %
Platelets: 286 10*3/uL (ref 150–400)
RBC: 3.34 MIL/uL — ABNORMAL LOW (ref 3.87–5.11)
RDW: 19.6 % — ABNORMAL HIGH (ref 11.5–15.5)
WBC: 6.2 10*3/uL (ref 4.0–10.5)
nRBC: 0 % (ref 0.0–0.2)

## 2019-09-18 LAB — FERRITIN: Ferritin: 19 ng/mL (ref 11–307)

## 2019-09-18 LAB — IRON AND TIBC
Iron: 20 ug/dL — ABNORMAL LOW (ref 41–142)
Saturation Ratios: 4 % — ABNORMAL LOW (ref 21–57)
TIBC: 481 ug/dL — ABNORMAL HIGH (ref 236–444)
UIBC: 462 ug/dL — ABNORMAL HIGH (ref 120–384)

## 2019-09-18 MED ORDER — SODIUM CHLORIDE 0.9 % IV SOLN
Freq: Once | INTRAVENOUS | Status: DC | PRN
  Filled 2019-09-18: qty 250

## 2019-09-18 MED ORDER — ACETAMINOPHEN 325 MG PO TABS
650.0000 mg | ORAL_TABLET | Freq: Once | ORAL | Status: AC
  Administered 2019-09-18: 650 mg via ORAL

## 2019-09-18 MED ORDER — FAMOTIDINE IN NACL 20-0.9 MG/50ML-% IV SOLN: 20.0000 mg | Freq: Once | INTRAVENOUS | Status: DC

## 2019-09-18 MED ORDER — DIPHENHYDRAMINE HCL 50 MG/ML IJ SOLN: 50.0000 mg | Freq: Once | INTRAMUSCULAR | Status: DC | PRN

## 2019-09-18 MED ORDER — SODIUM CHLORIDE 0.9 % IV SOLN
INTRAVENOUS | Status: DC
  Administered 2019-09-18: 13:00:00 via INTRAVENOUS
  Filled 2019-09-18: qty 250

## 2019-09-18 MED ORDER — METHYLPREDNISOLONE SODIUM SUCC 125 MG IJ SOLR: 125.0000 mg | Freq: Once | INTRAMUSCULAR | Status: DC | PRN

## 2019-09-18 MED ORDER — METHYLPREDNISOLONE SODIUM SUCC 125 MG IJ SOLR
INTRAMUSCULAR | Status: AC
  Filled 2019-09-18: qty 2

## 2019-09-18 MED ORDER — FAMOTIDINE IN NACL 20-0.9 MG/50ML-% IV SOLN: 20.0000 mg | Freq: Once | INTRAVENOUS | Status: DC | PRN

## 2019-09-18 MED ORDER — ACETAMINOPHEN 325 MG PO TABS
ORAL_TABLET | ORAL | Status: AC
  Filled 2019-09-18: qty 2

## 2019-09-18 MED ORDER — METHYLPREDNISOLONE SODIUM SUCC 125 MG IJ SOLR: 125.0000 mg | Freq: Once | INTRAMUSCULAR | Status: DC

## 2019-09-18 MED ORDER — LORATADINE 10 MG PO TABS: 10.0000 mg | ORAL_TABLET | Freq: Once | ORAL | Status: DC

## 2019-09-18 MED ORDER — ALBUTEROL SULFATE (2.5 MG/3ML) 0.083% IN NEBU
2.5000 mg | INHALATION_SOLUTION | Freq: Once | RESPIRATORY_TRACT | Status: DC | PRN
  Filled 2019-09-18: qty 3

## 2019-09-18 MED ORDER — DIPHENHYDRAMINE HCL 25 MG PO CAPS: 50.0000 mg | ORAL_CAPSULE | Freq: Once | ORAL | Status: DC

## 2019-09-18 MED ORDER — SODIUM CHLORIDE 0.9 % IV SOLN
750.0000 mg | Freq: Once | INTRAVENOUS | Status: AC
  Administered 2019-09-18: 750 mg via INTRAVENOUS
  Filled 2019-09-18: qty 15

## 2019-09-18 MED ORDER — FAMOTIDINE IN NACL 20-0.9 MG/50ML-% IV SOLN
INTRAVENOUS | Status: AC
  Filled 2019-09-18: qty 50

## 2019-09-18 MED ORDER — EPINEPHRINE 0.3 MG/0.3ML IJ SOAJ: 0.3000 mg | Freq: Once | INTRAMUSCULAR | Status: DC | PRN

## 2019-09-18 MED ORDER — DIPHENHYDRAMINE HCL 25 MG PO CAPS
ORAL_CAPSULE | ORAL | Status: AC
  Filled 2019-09-18: qty 2

## 2019-09-18 MED ORDER — DIPHENHYDRAMINE HCL 50 MG/ML IJ SOLN
INTRAMUSCULAR | Status: AC
  Filled 2019-09-18: qty 1

## 2019-09-18 NOTE — Progress Notes (Signed)
MD changed premeds for Injectafer to Claritin 10 mg + APAP 650 mg PO  Kennith Center, Pharm.D., CPP 09/18/2019@1 :33 PM

## 2019-09-18 NOTE — Patient Instructions (Signed)

## 2019-09-20 DIAGNOSIS — F411 Generalized anxiety disorder: Secondary | ICD-10-CM | POA: Diagnosis not present

## 2019-09-22 DIAGNOSIS — G4733 Obstructive sleep apnea (adult) (pediatric): Secondary | ICD-10-CM | POA: Diagnosis not present

## 2019-09-22 DIAGNOSIS — D500 Iron deficiency anemia secondary to blood loss (chronic): Secondary | ICD-10-CM | POA: Diagnosis not present

## 2019-09-22 DIAGNOSIS — D649 Anemia, unspecified: Secondary | ICD-10-CM | POA: Diagnosis not present

## 2019-09-22 DIAGNOSIS — F419 Anxiety disorder, unspecified: Secondary | ICD-10-CM | POA: Diagnosis not present

## 2019-09-22 DIAGNOSIS — E785 Hyperlipidemia, unspecified: Secondary | ICD-10-CM | POA: Diagnosis not present

## 2019-09-22 DIAGNOSIS — F329 Major depressive disorder, single episode, unspecified: Secondary | ICD-10-CM | POA: Diagnosis not present

## 2019-09-22 DIAGNOSIS — Z9989 Dependence on other enabling machines and devices: Secondary | ICD-10-CM | POA: Diagnosis not present

## 2019-09-22 DIAGNOSIS — K922 Gastrointestinal hemorrhage, unspecified: Secondary | ICD-10-CM | POA: Diagnosis not present

## 2019-09-23 DIAGNOSIS — F419 Anxiety disorder, unspecified: Secondary | ICD-10-CM | POA: Diagnosis not present

## 2019-09-23 DIAGNOSIS — F329 Major depressive disorder, single episode, unspecified: Secondary | ICD-10-CM | POA: Diagnosis not present

## 2019-09-23 DIAGNOSIS — G4733 Obstructive sleep apnea (adult) (pediatric): Secondary | ICD-10-CM | POA: Diagnosis not present

## 2019-09-23 DIAGNOSIS — E785 Hyperlipidemia, unspecified: Secondary | ICD-10-CM | POA: Diagnosis not present

## 2019-09-23 DIAGNOSIS — D649 Anemia, unspecified: Secondary | ICD-10-CM | POA: Diagnosis not present

## 2019-09-23 DIAGNOSIS — D500 Iron deficiency anemia secondary to blood loss (chronic): Secondary | ICD-10-CM | POA: Diagnosis not present

## 2019-09-28 DIAGNOSIS — Z9189 Other specified personal risk factors, not elsewhere classified: Secondary | ICD-10-CM | POA: Diagnosis not present

## 2019-10-03 ENCOUNTER — Encounter: Payer: Self-pay | Admitting: Hematology

## 2019-10-03 DIAGNOSIS — Z9189 Other specified personal risk factors, not elsewhere classified: Secondary | ICD-10-CM | POA: Diagnosis not present

## 2019-10-04 ENCOUNTER — Telehealth: Payer: Self-pay

## 2019-10-04 ENCOUNTER — Inpatient Hospital Stay: Payer: Medicare Other

## 2019-10-04 ENCOUNTER — Other Ambulatory Visit: Payer: Self-pay | Admitting: Medical

## 2019-10-04 ENCOUNTER — Other Ambulatory Visit: Payer: Self-pay

## 2019-10-04 ENCOUNTER — Inpatient Hospital Stay (HOSPITAL_BASED_OUTPATIENT_CLINIC_OR_DEPARTMENT_OTHER): Payer: Medicare Other | Admitting: Medical

## 2019-10-04 VITALS — BP 154/68 | HR 108 | Temp 97.8°F | Resp 24

## 2019-10-04 DIAGNOSIS — D649 Anemia, unspecified: Secondary | ICD-10-CM

## 2019-10-04 DIAGNOSIS — R195 Other fecal abnormalities: Secondary | ICD-10-CM

## 2019-10-04 DIAGNOSIS — D500 Iron deficiency anemia secondary to blood loss (chronic): Secondary | ICD-10-CM

## 2019-10-04 DIAGNOSIS — K921 Melena: Secondary | ICD-10-CM | POA: Diagnosis not present

## 2019-10-04 DIAGNOSIS — R42 Dizziness and giddiness: Secondary | ICD-10-CM | POA: Diagnosis not present

## 2019-10-04 DIAGNOSIS — K254 Chronic or unspecified gastric ulcer with hemorrhage: Secondary | ICD-10-CM

## 2019-10-04 DIAGNOSIS — R0600 Dyspnea, unspecified: Secondary | ICD-10-CM

## 2019-10-04 DIAGNOSIS — R0609 Other forms of dyspnea: Secondary | ICD-10-CM | POA: Diagnosis not present

## 2019-10-04 DIAGNOSIS — R5383 Other fatigue: Secondary | ICD-10-CM | POA: Diagnosis not present

## 2019-10-04 LAB — CBC WITH DIFFERENTIAL (CANCER CENTER ONLY)
Abs Immature Granulocytes: 0.03 10*3/uL (ref 0.00–0.07)
Basophils Absolute: 0.1 10*3/uL (ref 0.0–0.1)
Basophils Relative: 1 %
Eosinophils Absolute: 0.2 10*3/uL (ref 0.0–0.5)
Eosinophils Relative: 3 %
HCT: 29.8 % — ABNORMAL LOW (ref 36.0–46.0)
Hemoglobin: 8.7 g/dL — ABNORMAL LOW (ref 12.0–15.0)
Immature Granulocytes: 1 %
Lymphocytes Relative: 23 %
Lymphs Abs: 1.3 10*3/uL (ref 0.7–4.0)
MCH: 28.4 pg (ref 26.0–34.0)
MCHC: 29.2 g/dL — ABNORMAL LOW (ref 30.0–36.0)
MCV: 97.4 fL (ref 80.0–100.0)
Monocytes Absolute: 0.7 10*3/uL (ref 0.1–1.0)
Monocytes Relative: 12 %
Neutro Abs: 3.6 10*3/uL (ref 1.7–7.7)
Neutrophils Relative %: 60 %
Platelet Count: 304 10*3/uL (ref 150–400)
RBC: 3.06 MIL/uL — ABNORMAL LOW (ref 3.87–5.11)
RDW: 24.2 % — ABNORMAL HIGH (ref 11.5–15.5)
WBC Count: 5.8 10*3/uL (ref 4.0–10.5)
nRBC: 0 % (ref 0.0–0.2)

## 2019-10-04 LAB — SAMPLE TO BLOOD BANK

## 2019-10-04 NOTE — Patient Instructions (Signed)
COVID-19: How to Protect Yourself and Others Know how it spreads  There is currently no vaccine to prevent coronavirus disease 2019 (COVID-19).  The best way to prevent illness is to avoid being exposed to this virus.  The virus is thought to spread mainly from person-to-person. ? Between people who are in close contact with one another (within about 6 feet). ? Through respiratory droplets produced when an infected person coughs, sneezes or talks. ? These droplets can land in the mouths or noses of people who are nearby or possibly be inhaled into the lungs. ? Some recent studies have suggested that COVID-19 may be spread by people who are not showing symptoms. Everyone should Clean your hands often  Wash your hands often with soap and water for at least 20 seconds especially after you have been in a public place, or after blowing your nose, coughing, or sneezing.  If soap and water are not readily available, use a hand sanitizer that contains at least 60% alcohol. Cover all surfaces of your hands and rub them together until they feel dry.  Avoid touching your eyes, nose, and mouth with unwashed hands. Avoid close contact  Stay home if you are sick.  Avoid close contact with people who are sick.  Put distance between yourself and other people. ? Remember that some people without symptoms may be able to spread virus. ? This is especially important for people who are at higher risk of getting very sick.www.cdc.gov/coronavirus/2019-ncov/need-extra-precautions/people-at-higher-risk.html Cover your mouth and nose with a cloth face cover when around others  You could spread COVID-19 to others even if you do not feel sick.  Everyone should wear a cloth face cover when they have to go out in public, for example to the grocery store or to pick up other necessities. ? Cloth face coverings should not be placed on young children under age 2, anyone who has trouble breathing, or is unconscious,  incapacitated or otherwise unable to remove the mask without assistance.  The cloth face cover is meant to protect other people in case you are infected.  Do NOT use a facemask meant for a healthcare worker.  Continue to keep about 6 feet between yourself and others. The cloth face cover is not a substitute for social distancing. Cover coughs and sneezes  If you are in a private setting and do not have on your cloth face covering, remember to always cover your mouth and nose with a tissue when you cough or sneeze or use the inside of your elbow.  Throw used tissues in the trash.  Immediately wash your hands with soap and water for at least 20 seconds. If soap and water are not readily available, clean your hands with a hand sanitizer that contains at least 60% alcohol. Clean and disinfect  Clean AND disinfect frequently touched surfaces daily. This includes tables, doorknobs, light switches, countertops, handles, desks, phones, keyboards, toilets, faucets, and sinks. www.cdc.gov/coronavirus/2019-ncov/prevent-getting-sick/disinfecting-your-home.html  If surfaces are dirty, clean them: Use detergent or soap and water prior to disinfection.  Then, use a household disinfectant. You can see a list of EPA-registered household disinfectants here. cdc.gov/coronavirus 03/14/2019 This information is not intended to replace advice given to you by your health care provider. Make sure you discuss any questions you have with your health care provider. Document Released: 02/21/2019 Document Revised: 03/22/2019 Document Reviewed: 02/21/2019 Elsevier Patient Education  2020 [employer]  

## 2019-10-04 NOTE — Progress Notes (Signed)
[facility] Progress Note   Molly Lucas [facility] [id number] 1946/06/17 73 y.o.  Molly Lucas [facility] is managed by Dr. Sullivan Lone . Actively treated with chemotherapy/immunotherapy/hormonal therapy: no  Next scheduled appointment with provider: 10/18/2019  Assessment: Plan:    Gastrointestinal hemorrhage associated with gastric ulcer  Dyspnea on exertion  Iron deficiency anemia due to chronic blood loss  Occult blood in stools   History of GI bleeding, iron deficiency anemia, hematochezia, and dyspnea on exertion.  The patient was referred to our office from Dr. Liliane Channel office where she saw yesterday.  Her labs there yesterday returned with a hemoglobin of 8.7.  Labs today showed a hemoglobin of 8.7, hematocrit of 29.8, and MCV of 97.4.  Iron studies are pending.  She is scheduled to be seen by  Dr. Sullivan Lone on 10/18/2019.  She was given her contact information and told to return sooner should her condition worsen.  She expresses understanding and agreement with this plan.  Please see After Visit Summary for patient specific instructions.  Future Appointments  Date Time Provider [place]  10/18/2019 11:30 AM [facility] LAB 4 [facility] None  10/18/2019 12:00 PM Brunetta Genera, MD [facility] None  10/18/2019  1:00 PM [facility] INFUSION [facility] None  11/17/2019 11:15 AM [facility] LAB 3 [facility] None  11/17/2019 12:00 PM [facility] INFUSION [facility] None    No orders of the defined types were placed in this encounter.      Subjective:   Patient ID:  Molly Lucas is a 73 y.o. (DOB November 23, 1945) female.  Chief Complaint:  Chief Complaint  Patient presents with  . Fatigue    HPI Molly Lucas  Is a 73 y.o. female with a diagnosis of iron deficiency anemia secondary to GI blood loss associated with a gastric ulcer.  She was seen by [facility] who is her gastroenterologist yesterday.  She was noted to have a pulse of  117, shortness of breath, melena, dizziness and was noted to have a hemoglobin of 8.7.  She was told to come to our office or present to the emergency room as she would potentially need a transfusion.  She presents today with continued shortness of breath, dyspnea on exertion, fatigue, black tarry stools, and dizziness.  She states that she has been experiencing this for a couple of days.  Her labs today returned with a hemoglobin of 8.7, hematocrit of 29.8, and MCV of 97.4.  Iron studies are pending.  She has been eating and drinking normally.  She lives in an assisted living facility.  She reports she has been in quarantine for the past 2 weeks and is not able to be active.  Medications: I have reviewed the patient's current medications.  Allergies:  Allergies  Allergen Reactions  . Penicillins Swelling and Other (See Comments)    Tongue became swollen and turned black Did it involve swelling of the face/tongue/throat, SOB, or low BP? Yes Did it involve sudden or severe rash/hives, skin peeling, or any reaction on the inside of your mouth or nose? Yes Did you need to seek medical attention at a hospital or doctor's office? Yes When did it last happen? "I was a child" If all above answers are "NO", may proceed with cephalosporin use.     Past Medical History:  Diagnosis Date  . Anemia    Takes iron supplement  . Anxiety   . Arthritis   . Chronic pain of left thumb   . Constipation   .  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 Depression   . Fibroid, uterine   . GERD (gastroesophageal reflux disease)   . History of blood transfusion   . Hx of seasonal allergies   . Hyperlipemia   . Hypertension    hx of off medication now.  . OA (osteoarthritis) of hip    left  . Sleep apnea    dentist made mouthipiece     Past Surgical History:  Procedure Laterality Date  . BIOPSY  03/30/2019   Procedure: BIOPSY;  Surgeon: Thornton Park, MD;   Location: [place];  Service: Gastroenterology;;  . [place]  08/2015   with abductor pollicis longus tendon transfer and suspensionplasty  . COLONOSCOPY WITH PROPOFOL N/A 03/31/2019   Procedure: COLONOSCOPY WITH PROPOFOL;  Surgeon: Thornton Park, MD;  Location: [place];  Service: Gastroenterology;  Laterality: N/A;  . DE QUERVAIN'S RELEASE  08/2015  . DILATATION & CURRETTAGE/HYSTEROSCOPY WITH RESECTOCOPE  2007  . ESOPHAGOGASTRODUODENOSCOPY N/A 11/30/2015   Procedure: ESOPHAGOGASTRODUODENOSCOPY (EGD);  Surgeon: Richmond [place], MD;  Location: [facility];  Service: Endoscopy;  Laterality: N/A;  . ESOPHAGOGASTRODUODENOSCOPY (EGD) WITH PROPOFOL N/A 03/30/2019   Procedure: ESOPHAGOGASTRODUODENOSCOPY (EGD) WITH PROPOFOL;  Surgeon: Thornton Park, MD;  Location: [place];  Service: Gastroenterology;  Laterality: N/A;  . HERNIA REPAIR Left 06/2012  . INGUINAL HERNIA REPAIR Right 05/03/2013   Procedure: HERNIA REPAIR INGUINAL ADULT;  Surgeon: Harl Bowie, MD;  Location: [place];  Service: General;  Laterality: Right;  . INGUINAL HERNIA REPAIR Right 08/23/2013   Procedure: LAPAROSCOPIC INGUINAL HERNIA;  Surgeon: Harl Bowie, MD;  Location: [place];  Service: General;  Laterality: Right;  . INSERTION OF MESH Right 05/03/2013   Procedure: INSERTION OF MESH;  Surgeon: Harl Bowie, MD;  Location: [place];  Service: General;  Laterality: Right;  . INSERTION OF MESH Right 08/23/2013   Procedure: INSERTION OF MESH;  Surgeon: Harl Bowie, MD;  Location: [place];  Service: General;  Laterality: Right;  . METACARPOPHALANGEAL JOINT ARTHRODESIS  08/2015   with local bone graft  . TONSILLECTOMY    . TOTAL HIP ARTHROPLASTY  06/24/2012   Procedure: TOTAL HIP ARTHROPLASTY ANTERIOR APPROACH;  Surgeon: Mcarthur Rossetti, MD;  Location: [facility];  Service: Orthopedics;  Laterality: Left;  Left total hip arthroplasty, anterior approach  . UTERINE FIBROID  SURGERY  2007  . WISDOM TOOTH EXTRACTION      Family History  Problem Relation Age of Onset  . Heart failure Mother   . Osteoporosis Mother   . Arthritis Mother   . CAD Maternal Grandfather   . Heart failure Maternal Grandfather   . Dementia Maternal Grandfather   . CAD Paternal Grandmother   . CAD Paternal Grandfather   . Arthritis Sister     Social History   Socioeconomic History  . Marital status: Divorced    Spouse name: Not on file  . Number of children: Not on file  . Years of education: Not on file  . Highest education level: Not on file  Occupational History  . Not on file  Social Needs  . Financial resource strain: Not on file  . Food insecurity    Worry: Not on file    Inability: Not on file  . Transportation needs    Medical: Not on file    Non-medical: Not on file  Tobacco Use  . Smoking status: Never Smoker  . Smokeless tobacco:  Never Used  Substance and Sexual Activity  . Alcohol use: Yes    Alcohol/week: 2.0 standard drinks    Types: 2 Glasses of wine per week    Comment: occasional glass of wine   . Drug use: No  . Sexual activity: Not on file  Lifestyle  . Physical activity    Days per week: Not on file    Minutes per session: Not on file  . Stress: Not on file  Relationships  . Social Herbalist on phone: Not on file    Gets together: Not on file    Attends religious service: Not on file    Active member of club or organization: Not on file    Attends meetings of clubs or organizations: Not on file    Relationship status: Not on file  . Intimate partner violence    Fear of current or ex partner: Not on file    Emotionally abused: Not on file    Physically abused: Not on file    Forced sexual activity: Not on file  Other Topics Concern  . Not on file  Social History Narrative  . Not on file    Past Medical History, Surgical history, Social history, and Family history were reviewed and updated as appropriate.   Please  see review of systems for further details on the patient's review from today.   Review of Systems:  Review of Systems  Constitutional: Positive for fatigue. Negative for chills, diaphoresis and fever.  HENT: Negative for trouble swallowing.   Respiratory: Positive for shortness of breath. Negative for cough, choking, chest tightness, wheezing and stridor.   Cardiovascular: Negative for chest pain and palpitations.  Gastrointestinal: Positive for blood in stool. Negative for constipation, diarrhea, nausea and vomiting.  Neurological: Positive for dizziness and weakness.    Objective:   Physical Exam:  BP (!) 154/68 (BP Location: Right Arm, Patient Position: Sitting)   Pulse (!) 108   Temp 97.8 F (36.6 C) (Temporal)   Resp (!) 24   SpO2 100%  ECOG: 1  Orthostatic Blood Pressure: Blood pressure:   sitting 102/74, standing 101/71 Pulse:   sitting 96, standing 107  Physical Exam Constitutional:      General: She is not in acute distress.    Appearance: Normal appearance. She is not diaphoretic.  HENT:     Head: Normocephalic and atraumatic.  Eyes:     General: No scleral icterus.       Right eye: No discharge.        Left eye: No discharge.  Neck:     Musculoskeletal: Normal range of motion.  Cardiovascular:     Rate and Rhythm: Regular rhythm. Tachycardia present.     Heart sounds: Normal heart sounds. No murmur. No friction rub. No gallop.      Comments: No presacral edema Pulmonary:     Effort: Pulmonary effort is normal. No respiratory distress.     Breath sounds: Normal breath sounds. No wheezing or rales.  Musculoskeletal:     Right lower leg: Edema present.     Left lower leg: Edema present.     Comments: Trace bilateral lower extremity edema to the knees.  Skin:    General: Skin is warm and dry.     Coloration: Skin is not pale.     Findings: No erythema or rash.  Neurological:     Mental Status: She is alert.     Coordination: Coordination normal.  Gait: Gait normal.  Psychiatric:        Behavior: Behavior normal.        Thought Content: Thought content normal.        Judgment: Judgment normal.     Lab Review:     Component Value Date/Time   NA 140 07/29/2019 0657   K 4.1 07/29/2019 0657   CL 107 07/29/2019 0657   CO2 26 07/29/2019 0657   GLUCOSE 103 (H) 07/29/2019 0657   BUN 11 07/29/2019 0657   CREATININE 0.70 07/29/2019 0657   CREATININE 1.07 02/01/2018 1419   CREATININE 0.80 04/02/2016 0945   CALCIUM 9.4 07/29/2019 0657   CALCIUM 10.9 (H) 02/01/2018 1419   PROT 5.9 (L) 07/27/2019 1729   ALBUMIN 3.5 07/27/2019 1729   AST 20 07/27/2019 1729   AST 20 02/01/2018 1419   ALT 22 07/27/2019 1729   ALT 24 02/01/2018 1419   ALKPHOS 69 07/27/2019 1729   BILITOT 0.6 07/27/2019 1729   BILITOT 1.3 (H) 02/01/2018 1419   GFRNONAA >60 07/29/2019 0657   GFRNONAA 50 (L) 02/01/2018 1419   GFRAA >60 07/29/2019 0657   GFRAA 58 (L) 02/01/2018 1419       Component Value Date/Time   WBC 5.8 10/04/2019 1526   WBC 6.2 09/18/2019 1157   RBC 3.06 (L) 10/04/2019 1526   HGB 8.7 (L) 10/04/2019 1526   HCT 29.8 (L) 10/04/2019 1526   PLT 304 10/04/2019 1526   MCV 97.4 10/04/2019 1526   MCH 28.4 10/04/2019 1526   MCHC 29.2 (L) 10/04/2019 1526   RDW 24.2 (H) 10/04/2019 1526   LYMPHSABS 1.3 10/04/2019 1526   MONOABS 0.7 10/04/2019 1526   EOSABS 0.2 10/04/2019 1526   BASOSABS 0.1 10/04/2019 1526   -------------------------------  Imaging from last 24 hours (if applicable):  Radiology interpretation: No results found.

## 2019-10-04 NOTE — Telephone Encounter (Signed)
Patient returned call to [facility] and stated she is lightheaded and still does not feel well. Patient stated she was instructed by Dr. Earlean Shawl to seek care at [facility] since he could not get her in to be seen at [place] made aware and patient set up for lab and a visit in [facility] today. Patient made aware and stated she will arrive at the office within the hour for an appointment.

## 2019-10-04 NOTE — Telephone Encounter (Signed)
Called patient in reference to her my chart message. No answer. Left voicemail for patient to return call to [phone].

## 2019-10-06 LAB — IRON AND TIBC
Iron: 28 ug/dL — ABNORMAL LOW (ref 41–142)
Saturation Ratios: 7 % — ABNORMAL LOW (ref 21–57)
TIBC: 431 ug/dL (ref 236–444)
UIBC: 403 ug/dL — ABNORMAL HIGH (ref 120–384)

## 2019-10-06 LAB — FERRITIN: Ferritin: 113 ng/mL (ref 11–307)

## 2019-10-11 DIAGNOSIS — F411 Generalized anxiety disorder: Secondary | ICD-10-CM | POA: Diagnosis not present

## 2019-10-18 ENCOUNTER — Inpatient Hospital Stay: Payer: Medicare Other | Attending: Hematology

## 2019-10-18 ENCOUNTER — Inpatient Hospital Stay (HOSPITAL_BASED_OUTPATIENT_CLINIC_OR_DEPARTMENT_OTHER): Payer: Medicare Other | Admitting: Hematology

## 2019-10-18 ENCOUNTER — Other Ambulatory Visit: Payer: Self-pay

## 2019-10-18 ENCOUNTER — Inpatient Hospital Stay: Payer: Medicare Other

## 2019-10-18 VITALS — BP 138/81 | HR 103 | Temp 98.2°F | Resp 18 | Ht 62.0 in | Wt 207.9 lb

## 2019-10-18 VITALS — BP 113/66 | HR 74 | Temp 98.4°F | Resp 18

## 2019-10-18 DIAGNOSIS — D500 Iron deficiency anemia secondary to blood loss (chronic): Secondary | ICD-10-CM

## 2019-10-18 DIAGNOSIS — M1990 Unspecified osteoarthritis, unspecified site: Secondary | ICD-10-CM | POA: Diagnosis not present

## 2019-10-18 DIAGNOSIS — R0602 Shortness of breath: Secondary | ICD-10-CM | POA: Insufficient documentation

## 2019-10-18 DIAGNOSIS — K922 Gastrointestinal hemorrhage, unspecified: Secondary | ICD-10-CM | POA: Diagnosis not present

## 2019-10-18 DIAGNOSIS — Z79899 Other long term (current) drug therapy: Secondary | ICD-10-CM | POA: Insufficient documentation

## 2019-10-18 DIAGNOSIS — Z638 Other specified problems related to primary support group: Secondary | ICD-10-CM | POA: Insufficient documentation

## 2019-10-18 DIAGNOSIS — I10 Essential (primary) hypertension: Secondary | ICD-10-CM | POA: Insufficient documentation

## 2019-10-18 DIAGNOSIS — K219 Gastro-esophageal reflux disease without esophagitis: Secondary | ICD-10-CM | POA: Insufficient documentation

## 2019-10-18 DIAGNOSIS — R630 Anorexia: Secondary | ICD-10-CM | POA: Insufficient documentation

## 2019-10-18 DIAGNOSIS — R5383 Other fatigue: Secondary | ICD-10-CM | POA: Insufficient documentation

## 2019-10-18 DIAGNOSIS — E785 Hyperlipidemia, unspecified: Secondary | ICD-10-CM | POA: Insufficient documentation

## 2019-10-18 DIAGNOSIS — G4730 Sleep apnea, unspecified: Secondary | ICD-10-CM | POA: Diagnosis not present

## 2019-10-18 DIAGNOSIS — K449 Diaphragmatic hernia without obstruction or gangrene: Secondary | ICD-10-CM | POA: Diagnosis not present

## 2019-10-18 DIAGNOSIS — F418 Other specified anxiety disorders: Secondary | ICD-10-CM | POA: Diagnosis not present

## 2019-10-18 DIAGNOSIS — D649 Anemia, unspecified: Secondary | ICD-10-CM | POA: Diagnosis not present

## 2019-10-18 LAB — CBC WITH DIFFERENTIAL/PLATELET
Abs Immature Granulocytes: 0.02 10*3/uL (ref 0.00–0.07)
Basophils Absolute: 0.1 10*3/uL (ref 0.0–0.1)
Basophils Relative: 1 %
Eosinophils Absolute: 0.4 10*3/uL (ref 0.0–0.5)
Eosinophils Relative: 8 %
HCT: 31.4 % — ABNORMAL LOW (ref 36.0–46.0)
Hemoglobin: 9.1 g/dL — ABNORMAL LOW (ref 12.0–15.0)
Immature Granulocytes: 0 %
Lymphocytes Relative: 20 %
Lymphs Abs: 1 10*3/uL (ref 0.7–4.0)
MCH: 25.9 pg — ABNORMAL LOW (ref 26.0–34.0)
MCHC: 29 g/dL — ABNORMAL LOW (ref 30.0–36.0)
MCV: 89.2 fL (ref 80.0–100.0)
Monocytes Absolute: 0.5 10*3/uL (ref 0.1–1.0)
Monocytes Relative: 10 %
Neutro Abs: 2.9 10*3/uL (ref 1.7–7.7)
Neutrophils Relative %: 61 %
Platelets: 212 10*3/uL (ref 150–400)
RBC: 3.52 MIL/uL — ABNORMAL LOW (ref 3.87–5.11)
RDW: 20.6 % — ABNORMAL HIGH (ref 11.5–15.5)
WBC: 4.9 10*3/uL (ref 4.0–10.5)
nRBC: 0 % (ref 0.0–0.2)

## 2019-10-18 LAB — IRON AND TIBC
Iron: 20 ug/dL — ABNORMAL LOW (ref 41–142)
Saturation Ratios: 4 % — ABNORMAL LOW (ref 21–57)
TIBC: 474 ug/dL — ABNORMAL HIGH (ref 236–444)
UIBC: 454 ug/dL — ABNORMAL HIGH (ref 120–384)

## 2019-10-18 LAB — FERRITIN: Ferritin: 29 ng/mL (ref 11–307)

## 2019-10-18 MED ORDER — ACETAMINOPHEN 325 MG PO TABS
650.0000 mg | ORAL_TABLET | Freq: Once | ORAL | Status: AC
  Administered 2019-10-18: 650 mg via ORAL

## 2019-10-18 MED ORDER — SODIUM CHLORIDE 0.9 % IV SOLN
INTRAVENOUS | Status: DC
  Administered 2019-10-18: 14:00:00 via INTRAVENOUS
  Filled 2019-10-18: qty 250

## 2019-10-18 MED ORDER — LORATADINE 10 MG PO TABS: 10.0000 mg | ORAL_TABLET | Freq: Once | ORAL | Status: DC

## 2019-10-18 MED ORDER — SODIUM CHLORIDE 0.9 % IV SOLN
750.0000 mg | Freq: Once | INTRAVENOUS | Status: AC
  Administered 2019-10-18: 750 mg via INTRAVENOUS
  Filled 2019-10-18: qty 15

## 2019-10-18 MED ORDER — ACETAMINOPHEN 325 MG PO TABS
ORAL_TABLET | ORAL | Status: AC
  Filled 2019-10-18: qty 2

## 2019-10-18 NOTE — Progress Notes (Signed)
Patient states that she takes Zyrtec daily for allergies and requested to not take Claritin before the injectafer infusion. Per Dr. Irene Limbo patient does not have to take Claritin.

## 2019-10-18 NOTE — Patient Instructions (Signed)

## 2019-10-18 NOTE — Progress Notes (Signed)
HEMATOLOGY/ONCOLOGY CLINIC NOTE  Date of Service: 10/18/2019  Patient Care Team: [name], MD as PCP - General (Internal Medicine)  CHIEF COMPLAINTS/PURPOSE OF CONSULTATION:   F/u for iron deficiency Anemia   HISTORY OF PRESENTING ILLNESS:  Molly Lucas is a wonderful 73 y.o. female who has been referred to [place] by her PCP Dr. Bevelyn Buckles for evaluation and management of weight loss with elevated Hemoglobin and Hematocrit and hypercalcemia. She presents to the clinic today accompanied by her son.   She notes she has lost weight recently but this was due to her trouble eating. She has seen GI Dr. Thana Farr and had a endoscopy last week which showed a hiatal hernia which made it hard to swallow. A biopsy was done as well. She was given Prilosec and her eating has recently improved. She has had 3 meals in the past few days. For the past few months she had no appetite. She has lost 15 pounds over the last 2 months.  She notes she is prone to being anemic. She has had anemia episodes 3 times in the past few years. First time in 2014 she was treated with IV Feraheme. Another time in 11/2015 she had a ulcer with GI bleeding and her Hg went down to 4 and she was given blood transfusion. In 10/2017 she her Hg went down to again and was again treated with IV Feraheme. Her last colonoscopy was 2 years ago and was clear from polyps or concerns. She notes having black stool only once since her ulcer due to Pepto bismol. She notes she has not been on oral calcium for 2-3 months. She denies back pain, bone pain or kidney stones.   Today she notes family stress. She has had a mammogram in the last 2 years. She now lives in a retirement community.  She notes she had fluid around her heart once and went to see cardiologist who cleared her.  On review of symptoms, pt notes tightness in chest resolved with Prilosec. She denies back pain or bone pain. She has decreased appetite still. She notes  since not eating much she has felt feverish and moderately sweaty. This has not occurred in the past few days. She notes her bowel movements are random because she is not currently eating much. She is ale to pass urine fine. She denies cough, chest pain or difficulty breathing. She notes stress fracture b/w L3 and L4 which has arthritis around it.    INTERVAL HISTORY:  [name] is a 73 y.o. female here for evaluation and management of anemia. The patient's last visit with [place] was on 08/03/2019. The pt reports that she is doing well overall.  The pt reports that she had a GI bleed about 2 weeks ago with black stools. After which the pt went to the ER for a blood transfusion but was denied due to her Hgb being just above 8. She has been to [facility] in the interim and was given an Angiogram. They could not find the source of her GI bleeding. The last time pt spoke with Dr. Watt Climes he had no recommendations or ideas on how to stop or slow her bleeding. Pt has been experiencing SOB and fatigue due to her anemia. Pt denies any history of Afib or hematuria.   Lab results today (10/18/19) of CBC w/diff is as follows: all values are WNL except for RBC at 3.52, Hgb at 9.1, HCT at 31.4, MCH at 25.9, MCHC at  29.0, RDW at 20.6. 10/18/2019 Ferritin at 29 10/18/2019 Iron and TIBC is as follows: Iron at 20, TIBC at 474, Sat Ratios at 4, UIBC at 454  On review of systems, pt reports SOB, fatigue, black stools and denies hematuria and any other symptoms.   MEDICAL HISTORY:  Past Medical History:  Diagnosis Date  . Anemia    Takes iron supplement  . Anxiety   . Arthritis   . Chronic pain of left thumb   . Constipation   . 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 Depression   . Fibroid, uterine   . GERD (gastroesophageal reflux disease)   . History of blood transfusion   . Hx of seasonal allergies   . Hyperlipemia   . Hypertension    hx of off  medication now.  . OA (osteoarthritis) of hip    left  . Sleep apnea    dentist made mouthipiece     SURGICAL HISTORY: Past Surgical History:  Procedure Laterality Date  . BIOPSY  03/30/2019   Procedure: BIOPSY;  Surgeon: Thornton Park, MD;  Location: [place];  Service: Gastroenterology;;  . [place]  08/2015   with abductor pollicis longus tendon transfer and suspensionplasty  . COLONOSCOPY WITH PROPOFOL N/A 03/31/2019   Procedure: COLONOSCOPY WITH PROPOFOL;  Surgeon: Thornton Park, MD;  Location: [place];  Service: Gastroenterology;  Laterality: N/A;  . DE QUERVAIN'S RELEASE  08/2015  . DILATATION & CURRETTAGE/HYSTEROSCOPY WITH RESECTOCOPE  2007  . ESOPHAGOGASTRODUODENOSCOPY N/A 11/30/2015   Procedure: ESOPHAGOGASTRODUODENOSCOPY (EGD);  Surgeon: Richmond Campbell, MD;  Location: [facility];  Service: Endoscopy;  Laterality: N/A;  . ESOPHAGOGASTRODUODENOSCOPY (EGD) WITH PROPOFOL N/A 03/30/2019   Procedure: ESOPHAGOGASTRODUODENOSCOPY (EGD) WITH PROPOFOL;  Surgeon: Thornton Park, MD;  Location: [place];  Service: Gastroenterology;  Laterality: N/A;  . HERNIA REPAIR Left 06/2012  . INGUINAL HERNIA REPAIR Right 05/03/2013   Procedure: HERNIA REPAIR INGUINAL ADULT;  Surgeon: Harl Bowie, MD;  Location: [place];  Service: General;  Laterality: Right;  . INGUINAL HERNIA REPAIR Right 08/23/2013   Procedure: LAPAROSCOPIC INGUINAL HERNIA;  Surgeon: Harl Bowie, MD;  Location: [facility];  Service: General;  Laterality: Right;  . INSERTION OF MESH Right 05/03/2013   Procedure: INSERTION OF MESH;  Surgeon: Harl Bowie, MD;  Location: [place];  Service: General;  Laterality: Right;  . INSERTION OF MESH Right 08/23/2013   Procedure: INSERTION OF MESH;  Surgeon: Harl Bowie, MD;  Location: [place];  Service: General;  Laterality: Right;  . METACARPOPHALANGEAL JOINT ARTHRODESIS  08/2015   with local bone graft  . TONSILLECTOMY    . TOTAL  HIP ARTHROPLASTY  06/24/2012   Procedure: TOTAL HIP ARTHROPLASTY ANTERIOR APPROACH;  Surgeon: Mcarthur Rossetti, MD;  Location: [facility];  Service: Orthopedics;  Laterality: Left;  Left total hip arthroplasty, anterior approach  . UTERINE FIBROID SURGERY  2007  . WISDOM TOOTH EXTRACTION      SOCIAL HISTORY: Social History   Socioeconomic History  . Marital status: Divorced    Spouse name: Not on file  . Number of children: Not on file  . Years of education: Not on file  . Highest education level: Not on file  Occupational History  . Not on file  Social Needs  . Financial resource strain: Not on file  . Food insecurity    Worry: Not on file    Inability: Not on file  .  Transportation needs    Medical: Not on file    Non-medical: Not on file  Tobacco Use  . Smoking status: Never Smoker  . Smokeless tobacco: Never Used  Substance and Sexual Activity  . Alcohol use: Yes    Alcohol/week: 2.0 standard drinks    Types: 2 Glasses of wine per week    Comment: occasional glass of wine   . Drug use: No  . Sexual activity: Not on file  Lifestyle  . Physical activity    Days per week: Not on file    Minutes per session: Not on file  . Stress: Not on file  Relationships  . Social Herbalist on phone: Not on file    Gets together: Not on file    Attends religious service: Not on file    Active member of club or organization: Not on file    Attends meetings of clubs or organizations: Not on file    Relationship status: Not on file  . Intimate partner violence    Fear of current or ex partner: Not on file    Emotionally abused: Not on file    Physically abused: Not on file    Forced sexual activity: Not on file  Other Topics Concern  . Not on file  Social History Narrative  . Not on file    FAMILY HISTORY: Family History  Problem Relation Age of Onset  . Heart failure Mother   . Osteoporosis Mother   . Arthritis Mother   . CAD Maternal Grandfather   .  Heart failure Maternal Grandfather   . Dementia Maternal Grandfather   . CAD Paternal Grandmother   . CAD Paternal Grandfather   . Arthritis Sister     ALLERGIES:  is allergic to penicillins.  MEDICATIONS:  Current Outpatient Medications  Medication Sig Dispense Refill  . CALCIUM PO Take 2 tablets by mouth daily.     . cetirizine (ZYRTEC) 10 MG tablet Take 10 mg by mouth daily.    . Cholecalciferol (VITAMIN D3) 250 MCG (10000 UT) TABS Take 10,000 Units by mouth daily.     . Cinnamon 500 MG capsule Take 1,000 mg by mouth daily.     . CYANOCOBALAMIN PO Take by mouth.    . Doxepin HCl (SILENOR) 6 MG TABS Take 6 mg by mouth at bedtime.     . DULoxetine (CYMBALTA) 60 MG capsule Take 60 mg by mouth at bedtime.     . fluticasone (FLONASE) 50 MCG/ACT nasal spray Place 2 sprays into both nostrils daily.    . Multiple Vitamins-Minerals (HAIR SKIN AND NAILS FORMULA PO) Take 1 tablet by mouth daily.    . multivitamin-lutein (OCUVITE-LUTEIN) CAPS Take 1 capsule by mouth daily.    . NON FORMULARY Take 1 capsule by mouth See admin instructions. Black [place] oil capsules: Take 1 capsule by mouth once a day    . Omega-3 1000 MG CAPS Take 2,000 mg by mouth daily.     . pravastatin (PRAVACHOL) 20 MG tablet Take 20 mg by mouth at bedtime.     . Turmeric 500 MG CAPS Take 500 mg by mouth daily.     . vitamin C (ASCORBIC ACID) 500 MG tablet Take 500 mg by mouth daily.     No current facility-administered medications for this visit.     REVIEW OF SYSTEMS: A 10+ POINT REVIEW OF SYSTEMS WAS OBTAINED including neurology, dermatology, psychiatry, cardiac, respiratory, lymph, extremities, GI, GU, Musculoskeletal, constitutional, breasts,  reproductive, HEENT.  All pertinent positives are noted in the HPI.  All others are negative.   PHYSICAL EXAMINATION: ECOG FS:2 - Symptomatic, <50% confined to bed  Vitals:   10/18/19 1217  BP: 138/81  Pulse: (!) 103  Resp: 18  Temp: 98.2 F (36.8 C)  SpO2: 98%   Wt  Readings from Last 3 Encounters:  10/18/19 207 lb 14.4 oz (94.3 kg)  08/03/19 205 lb 1.6 oz (93 kg)  07/29/19 205 lb 9.6 oz (93.3 kg)   Body mass index is 38.03 kg/m.    Exam was given in a chair   GENERAL:alert, in no acute distress and comfortable SKIN: no acute rashes, no significant lesions EYES: conjunctiva are pink and non-injected, sclera anicteric OROPHARYNX: MMM, no exudates, no oropharyngeal erythema or ulceration NECK: supple, no JVD LYMPH:  no palpable lymphadenopathy in the cervical, axillary or inguinal regions LUNGS: clear to auscultation b/l with normal respiratory effort HEART: regular rate & rhythm ABDOMEN:  normoactive bowel sounds , non tender, not distended. No palpable hepatosplenomegaly.  Extremity: no pedal edema PSYCH: alert & oriented x 3 with fluent speech NEURO: no focal motor/sensory deficits  LABORATORY DATA:  I have reviewed the data as listed  . CBC Latest Ref Rng & Units 10/18/2019 10/04/2019 09/18/2019  WBC 4.0 - 10.5 K/uL 4.9 5.8 6.2  Hemoglobin 12.0 - 15.0 g/dL 9.1(L) 8.7(L) 8.6(L)  Hematocrit 36.0 - 46.0 % 31.4(L) 29.8(L) 29.8(L)  Platelets 150 - 400 K/uL 212 304 286    . CMP Latest Ref Rng & Units 07/29/2019 07/27/2019 05/20/2019  Glucose 70 - 99 mg/dL 103(H) 109(H) 95  BUN 8 - 23 mg/dL 11 25(H) 19  Creatinine 0.44 - 1.00 mg/dL 0.70 0.78 0.69  Sodium 135 - 145 mmol/L 140 135 141  Potassium 3.5 - 5.1 mmol/L 4.1 3.8 3.9  Chloride 98 - 111 mmol/L 107 105 111  CO2 22 - 32 mmol/L 26 22 23   Calcium 8.9 - 10.3 mg/dL 9.4 9.8 9.1  Total Protein 6.5 - 8.1 g/dL - 5.9(L) -  Total Bilirubin 0.3 - 1.2 mg/dL - 0.6 -  Alkaline Phos 38 - 126 U/L - 69 -  AST 15 - 41 U/L - 20 -  ALT 0 - 44 U/L - 22 -   . Lab Results  Component Value Date   IRON 20 (L) 10/18/2019   TIBC 474 (H) 10/18/2019   IRONPCTSAT 4 (L) 10/18/2019   (Iron and TIBC)  Lab Results  Component Value Date   FERRITIN 29 10/18/2019   Component     Latest Ref Rng & Units  02/01/2018  IgG (Immunoglobin G), Serum     700 - 1,600 mg/dL 864  IgA     64 - 422 mg/dL 194  IgM (Immunoglobulin M), Srm     26 - 217 mg/dL 71  Total Protein ELP     6.0 - 8.5 g/dL 6.8  Albumin SerPl Elph-Mcnc     2.9 - 4.4 g/dL 3.8  Alpha 1     0.0 - 0.4 g/dL 0.3  Alpha2 Glob SerPl Elph-Mcnc     0.4 - 1.0 g/dL 0.7  B-Globulin SerPl Elph-Mcnc     0.7 - 1.3 g/dL 1.1  Gamma Glob SerPl Elph-Mcnc     0.4 - 1.8 g/dL 0.9  M Protein SerPl Elph-Mcnc     Not Observed g/dL Not Observed  Globulin, Total     2.2 - 3.9 g/dL 3.0  Albumin/Glob SerPl     0.7 -  1.7 1.3  IFE 1      Comment  Please Note (HCV):      Comment  PTH, Intact     15 - 65 pg/mL 56  Calcium, Total (PTH)     8.7 - 10.3 mg/dL 10.9 (H)  PTH Interp      Comment  Kappa free light chain     3.3 - 19.4 mg/L 23.4 (H)  Lamda free light chains     5.7 - 26.3 mg/L 10.3  Kappa, lamda light chain ratio     0.26 - 1.65 2.27 (H)  Beta-2 Microglobulin     0.6 - 2.4 mg/L 2.9 (H)  Sed Rate     0 - 22 mm/hr 2  LDH     125 - 245 U/L 169    OUTSIDE LABS            With Ferritin of 5.7         RADIOGRAPHIC STUDIES: I have personally reviewed the radiological images as listed and agreed with the findings in the report. No results found.  ASSESSMENT & PLAN:  Kasity Omahoney is a 73 y.o. Caucasian female with  1. Severe iron deficiency Anemia due to GI bleeding  2. Hiatal Hernia with h/o cameron ulcers with previous recurrent Iron deficiency needing IV iron replacement in the past. -Found on GI workup and likely cause of GI bleeding leading to anemia.   3. Recurrent Anemia - secondary to GI blood loss -in 2014 treated with IV Feraheme, 2017 after GI ulcer and was treated with Blood transfusion, 11/2016 treated with IV Feraheme.   . Lab Results  Component Value Date   IRON 20 (L) 10/18/2019   TIBC 474 (H) 10/18/2019   IRONPCTSAT 4 (L) 10/18/2019   (Iron and TIBC)  Lab Results  Component  Value Date   FERRITIN 29 10/18/2019   4. Weight loss - 15 pounds over 2 months -secondary to GI issues  -Improved eating with Prilosec 40mg  once a day  5. H/o Elevated Calcium at 11.7 rpt today improved to 11 PTH - WNL Myeloma panel - no M spike and neg IFE K/L ratio minimally abnormal but only minimal increase in Kappa light chains B2MG  -borderline elevated.   PLAN:  -Discussed pt labwork today, 10/18/19; all values are WNL except for RBC at 3.52, Hgb at 9.1, HCT at 31.4, MCH at 25.9, MCHC at 29.0, RDW at 20.6. -Discussed 10/18/2019 Ferritin at 29 -Discussed 10/18/2019 Iron and TIBC is as follows: Iron at 20, TIBC at 474, Sat Ratios at 4%, UIBC at 454 -Pt is experiencing bleeding issues, not a primary hematologic disorder -Pt is continuing to bleed from her GI tract as evidenced by her continued anemia and low iron levels despite IV Iron  -Colonoscopy, Upper Endoscopy, and an Angiogram performed - no cause determined -Advised pt that an Angiogram is not extremely sensitive to intermittent bleeding -Discussed continuing monthly IV Injectafer  -if no solution from GI to be able to control chronic GI bleeding then would need to continue LA Sandostatin +/- Lysteda to slow down GI bleeding. -Discussed the risk of blood clots with Lysteda -Pt has a negative history for heart attack, blood clot, afib and hematuria -Discussed the pros and cons of switching to IV Iron Dextran  -Recommended a Vitamin B complex and to return to the ER for active bleeds. -Will give IV Injectafer today, another in 1 week -Continue f/u with Dr. Watt Climes  -Will see pt back in 2  months  FOLLOW UP: IV Injectafer today IV Injectafer in 1 week and then monthly x 4  Monthly labs RTC with Dr Irene Limbo in 2 months  The total time spent in the appt was 25 minutes and more than 50% was on counseling and direct patient cares.  All of the patient's questions were answered with apparent satisfaction. The patient knows to call  the clinic with any problems, questions or concerns.    Sullivan Lone MD [place] AAHIVMS [facility] Hematology/Oncology Physician [facility]  (Office):       [phone] (Work cell):  [phone] (Fax):           [phone]  10/18/2019 1:51 PM  I, Yevette Edwards, am acting as a scribe for Dr. Sullivan Lone.   .I have reviewed the above documentation for accuracy and completeness, and I agree with the above. Brunetta Genera MD

## 2019-10-19 ENCOUNTER — Telehealth: Payer: Self-pay | Admitting: Hematology

## 2019-10-19 NOTE — Telephone Encounter (Signed)
Returned patient's phone call regarding rescheduling 12/16 appointment time, per patient's request appointment time has been rescheduled.

## 2019-10-19 NOTE — Telephone Encounter (Signed)
Scheduled appt per 12/9 los. ° °Left a vm of the appt date and time. °

## 2019-10-25 ENCOUNTER — Telehealth: Payer: Self-pay

## 2019-10-25 ENCOUNTER — Inpatient Hospital Stay: Payer: Medicare Other

## 2019-10-25 ENCOUNTER — Ambulatory Visit: Payer: Medicare Other

## 2019-10-25 ENCOUNTER — Other Ambulatory Visit: Payer: Self-pay

## 2019-10-25 VITALS — BP 106/55 | HR 86 | Temp 98.2°F | Resp 16

## 2019-10-25 DIAGNOSIS — D500 Iron deficiency anemia secondary to blood loss (chronic): Secondary | ICD-10-CM

## 2019-10-25 MED ORDER — SODIUM CHLORIDE 0.9 % IV SOLN
INTRAVENOUS | Status: DC
  Filled 2019-10-25: qty 250

## 2019-10-25 MED ORDER — SODIUM CHLORIDE 0.9 % IV SOLN
750.0000 mg | Freq: Once | INTRAVENOUS | Status: AC
  Administered 2019-10-25: 750 mg via INTRAVENOUS
  Filled 2019-10-25: qty 15

## 2019-10-25 MED ORDER — ACETAMINOPHEN 325 MG PO TABS
650.0000 mg | ORAL_TABLET | Freq: Once | ORAL | Status: AC
  Administered 2019-10-25: 09:00:00 650 mg via ORAL

## 2019-10-25 MED ORDER — ACETAMINOPHEN 325 MG PO TABS
ORAL_TABLET | ORAL | Status: AC
  Filled 2019-10-25: qty 2

## 2019-10-25 MED ORDER — LORATADINE 10 MG PO TABS
10.0000 mg | ORAL_TABLET | Freq: Once | ORAL | Status: AC
  Administered 2019-10-25: 10 mg via ORAL

## 2019-10-25 MED ORDER — LORATADINE 10 MG PO TABS
ORAL_TABLET | ORAL | Status: AC
  Filled 2019-10-25: qty 1

## 2019-10-25 NOTE — Patient Instructions (Signed)

## 2019-10-25 NOTE — Telephone Encounter (Signed)
RN left message for patient to return call regarding 12/16 appointment.

## 2019-10-29 NOTE — Progress Notes (Signed)
Cardiology Office Note   Date:  10/30/2019   ID:  Molly Lucas, Molly Lucas Nov 06, 1946, MRN [id number]  PCP:  [name], MD  Cardiologist:  Remotely, Dr. Claiborne Billings; last visit 03/2016 EP: None  Chief Complaint  Patient presents with  . Follow-up    1 year       History of Present Illness: Molly Lucas is a 73 y.o. female with PMH of HTN, HLD, OSA, iron deficiency anemia, depression, and anxiety, who presents for routine follow-up.  She was last evaluated by cardiology at an outpatient visit with Dr. Claiborne Billings 03/2016, at which time she was without cardiac complaints. She was initially followed by cardiology for DOE in the setting of severe iron deficiency anemia with Hgb 4.5 requiring 4 uPRBC and iron infusions. Echo at that time showed EF 55-60%, G2DD, mildly dilated aortic root, moderate MR, mild LAE, and trivial pericardial effusion. NST 03/2016 was a low risk study with possible defect in the inferior/inferolateral wall c/w prior infarct, though on Dr. Evette Georges review was felt to be 2/2 diaphragmatic attenuation. She was recommended to follow-up in 1 year, however was lost to follow-up.   She presents today for routine follow-up. She reports her biggest medical issue is her ongoing iron deficiency anemia. She has chronic SOB and occasional dizziness related to the anemia. No complaints of chest pain, palpitations, LE edema, orthopnea, PND, or syncope. Overall she feels she has been doing well from a cardiac standpoint.    Past Medical History:  Diagnosis Date  . Anemia    Takes iron supplement  . Anxiety   . Arthritis   . Chronic pain of left thumb   . Constipation   . 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 Depression   . Fibroid, uterine   . GERD (gastroesophageal reflux disease)   . History of blood transfusion   . Hx of seasonal allergies   . Hyperlipemia   . Hypertension    hx of off medication now.  . OA  (osteoarthritis) of hip    left  . Sleep apnea    dentist made mouthipiece     Past Surgical History:  Procedure Laterality Date  . BIOPSY  03/30/2019   Procedure: BIOPSY;  Surgeon: Thornton Park, MD;  Location: [place];  Service: Gastroenterology;;  . [place]  08/2015   with abductor pollicis longus tendon transfer and suspensionplasty  . COLONOSCOPY WITH PROPOFOL N/A 03/31/2019   Procedure: COLONOSCOPY WITH PROPOFOL;  Surgeon: Thornton Park, MD;  Location: [place];  Service: Gastroenterology;  Laterality: N/A;  . DE QUERVAIN'S RELEASE  08/2015  . DILATATION & CURRETTAGE/HYSTEROSCOPY WITH RESECTOCOPE  2007  . ESOPHAGOGASTRODUODENOSCOPY N/A 11/30/2015   Procedure: ESOPHAGOGASTRODUODENOSCOPY (EGD);  Surgeon: Richmond Campbell, MD;  Location: [facility];  Service: Endoscopy;  Laterality: N/A;  . ESOPHAGOGASTRODUODENOSCOPY (EGD) WITH PROPOFOL N/A 03/30/2019   Procedure: ESOPHAGOGASTRODUODENOSCOPY (EGD) WITH PROPOFOL;  Surgeon: Thornton Park, MD;  Location: [place];  Service: Gastroenterology;  Laterality: N/A;  . HERNIA REPAIR Left 06/2012  . INGUINAL HERNIA REPAIR Right 05/03/2013   Procedure: HERNIA REPAIR INGUINAL ADULT;  Surgeon: Harl Bowie, MD;  Location: [place];  Service: General;  Laterality: Right;  . INGUINAL HERNIA REPAIR Right 08/23/2013   Procedure: LAPAROSCOPIC INGUINAL HERNIA;  Surgeon: Harl Bowie, MD;  Location: [place];  Service: General;  Laterality: Right;  . INSERTION OF MESH Right 05/03/2013   Procedure: INSERTION OF MESH;  Surgeon: Harl Bowie, MD;  Location: [place];  Service: General;  Laterality: Right;  . INSERTION OF MESH Right 08/23/2013   Procedure: INSERTION OF MESH;  Surgeon: Harl Bowie, MD;  Location: [place];  Service: General;  Laterality: Right;  . METACARPOPHALANGEAL JOINT ARTHRODESIS  08/2015   with local bone graft  . TONSILLECTOMY    . TOTAL HIP ARTHROPLASTY  06/24/2012   Procedure:  TOTAL HIP ARTHROPLASTY ANTERIOR APPROACH;  Surgeon: Mcarthur Rossetti, MD;  Location: [facility];  Service: Orthopedics;  Laterality: Left;  Left total hip arthroplasty, anterior approach  . UTERINE FIBROID SURGERY  2007  . WISDOM TOOTH EXTRACTION       Current Outpatient Medications  Medication Sig Dispense Refill  . CALCIUM PO Take 2 tablets by mouth daily.     . cetirizine (ZYRTEC) 10 MG tablet Take 10 mg by mouth daily.    . Cholecalciferol (VITAMIN D3) 250 MCG (10000 UT) TABS Take 10,000 Units by mouth daily.     . Cinnamon 500 MG capsule Take 1,000 mg by mouth daily.     . CYANOCOBALAMIN PO Take by mouth.    . Doxepin HCl (SILENOR) 6 MG TABS Take 6 mg by mouth at bedtime.     . DULoxetine (CYMBALTA) 60 MG capsule Take 60 mg by mouth at bedtime.     . fluticasone (FLONASE) 50 MCG/ACT nasal spray Place 2 sprays into both nostrils daily.    . multivitamin-lutein (OCUVITE-LUTEIN) CAPS Take 1 capsule by mouth daily.    . NON FORMULARY Take 1 capsule by mouth See admin instructions. Black [place] oil capsules: Take 1 capsule by mouth once a day    . Omega-3 1000 MG CAPS Take 2,000 mg by mouth daily.     . pravastatin (PRAVACHOL) 20 MG tablet Take 20 mg by mouth at bedtime.     . Turmeric 500 MG CAPS Take 500 mg by mouth daily.     . vitamin C (ASCORBIC ACID) 500 MG tablet Take 500 mg by mouth daily.    . Multiple Vitamins-Minerals (HAIR SKIN AND NAILS FORMULA PO) Take 1 tablet by mouth daily.     No current facility-administered medications for this visit.    Allergies:   Penicillins    Social History:  The patient  reports that she has never smoked. She has never used smokeless tobacco. She reports current alcohol use of about 2.0 standard drinks of alcohol per week. She reports that she does not use drugs.   Family History:  The patient's family history includes Arthritis in her mother and sister; CAD in her maternal grandfather, paternal grandfather, and paternal grandmother;  Dementia in her maternal grandfather; Heart failure in her maternal grandfather and mother; Osteoporosis in her mother.    ROS:  Please see the history of present illness.   Otherwise, review of systems are positive for none.   All other systems are reviewed and negative.    PHYSICAL EXAM: VS:  BP 122/90   Pulse 92   Temp (!) 97.5 F (36.4 C)   Ht 5\' 2"  (1.575 m)   Wt 206 lb (93.4 kg)   SpO2 95%   BMI 37.68 kg/m  , BMI Body mass index is 37.68 kg/m. GEN: Well nourished, well developed, in no acute distress HEENT: sclera anicteric Neck: no JVD, carotid bruits, or masses Cardiac: RRR; no murmurs, rubs, or gallops,no edema  Respiratory:  clear to auscultation bilaterally, normal work of breathing GI: soft, obese, nontender,  nondistended, + BS MS: no deformity or atrophy Skin: warm and dry, no rash Neuro:  Strength and sensation are intact Psych: euthymic mood, full affect   EKG:  EKG is not ordered today.   Recent Labs: 07/27/2019: ALT 22 07/29/2019: BUN 11; Creatinine, Ser 0.70; Potassium 4.1; Sodium 140 10/18/2019: Hemoglobin 9.1; Platelets 212    Lipid Panel    Component Value Date/Time   CHOL 178 04/02/2016 0945   TRIG 84 04/02/2016 0945   HDL 89 04/02/2016 0945   CHOLHDL 2.0 04/02/2016 0945   VLDL 17 04/02/2016 0945   LDLCALC 72 04/02/2016 0945      Wt Readings from Last 3 Encounters:  10/30/19 206 lb (93.4 kg)  10/18/19 207 lb 14.4 oz (94.3 kg)  08/03/19 205 lb 1.6 oz (93 kg)      Other studies Reviewed: Additional studies/ records that were reviewed today include:   NST 2017:  The left ventricular ejection fraction is normal (55-65%).  Nuclear stress EF: 65%.  There was no ST segment deviation noted during stress.  Defect 1: There is a medium defect of moderate severity present in the basal inferior, basal inferolateral, mid inferior and mid inferolateral location. Consistent with prior infarct.  This is a low risk study.  No ischemia is  identified.    Echocardiogram 2017: Study Conclusions  - Left ventricle: The cavity size was normal. Systolic function was normal. The estimated ejection fraction was in the range of 55% to 60%. Wall motion was normal; there were no regional wall motion abnormalities. Features are consistent with a pseudonormal left ventricular filling pattern, with concomitant abnormal relaxation and increased filling pressure (grade 2 diastolic dysfunction). - Aortic valve: Valve area (Vmax): 2.32 cm^2. - Aortic root: The aortic root was mildly dilated. - Mitral valve: There was mild to moderate regurgitation. - Left atrium: The atrium was mildly dilated. - Pericardium, extracardiac: A trivial pericardial effusion was identified.     ASSESSMENT AND PLAN:  1. HTN: BP 122/90 today. Not on any medications. We discussed the importance of maintaining a low sodium diet and staying active for improving blood pressure - Will continue to monitor for now.   2. HLD: No recent lipids on file. She reports getting lipids checked by PCP last year and they were stable.  - Continue lipid monitoring per PCP - Continue pravastatin  3. Iron deficiency anemia: following closely with GI and oncology for further work-up to determine source of blood loss.  - Continue management/follow-up per oncology and GI.    Current medicines are reviewed at length with the patient today.  The patient does not have concerns regarding medicines.  The following changes have been made:  no change  Labs/ tests ordered today include:  No orders of the defined types were placed in this encounter.    Disposition:   FU with Dr. Claiborne Billings in 1 year  Signed, Abigail Butts, PA-C  10/30/2019 12:42 PM

## 2019-10-30 ENCOUNTER — Ambulatory Visit (INDEPENDENT_AMBULATORY_CARE_PROVIDER_SITE_OTHER): Payer: Medicare Other | Admitting: Medical

## 2019-10-30 ENCOUNTER — Other Ambulatory Visit: Payer: Self-pay

## 2019-10-30 ENCOUNTER — Encounter: Payer: Self-pay | Admitting: Hematology

## 2019-10-30 ENCOUNTER — Encounter: Payer: Self-pay | Admitting: Medical

## 2019-10-30 VITALS — BP 122/90 | HR 92 | Temp 97.5°F | Ht 62.0 in | Wt 206.0 lb

## 2019-10-30 DIAGNOSIS — E785 Hyperlipidemia, unspecified: Secondary | ICD-10-CM

## 2019-10-30 DIAGNOSIS — D509 Iron deficiency anemia, unspecified: Secondary | ICD-10-CM | POA: Diagnosis not present

## 2019-10-30 DIAGNOSIS — I10 Essential (primary) hypertension: Secondary | ICD-10-CM

## 2019-10-30 NOTE — Patient Instructions (Signed)
Medication Instructions:  Your physician recommends that you continue on your current medications as directed. Please refer to the Current Medication list given to you today. *If you need a refill on your cardiac medications before your next appointment, please call your pharmacy*  Lab Work: none If you have labs (blood work) drawn today and your tests are completely normal, you will receive your results only by: Marland Kitchen MyChart Message (if you have MyChart) OR . A paper copy in the mail If you have any lab test that is abnormal or we need to change your treatment, we will call you to review the results.  Testing/Procedures: none  Follow-Up: At [facility], you and your health needs are our priority.  As part of our continuing mission to provide you with exceptional heart care, we have created designated Provider Care Teams.  These Care Teams include your primary Cardiologist (physician) and Advanced Practice Providers (APPs -  Physician Assistants and Nurse Practitioners) who all work together to provide you with the care you need, when you need it.  Your next appointment:   12 month(s)  The format for your next appointment:   Either In Person or Virtual  Provider:   Shelva Majestic, MD

## 2019-11-15 DIAGNOSIS — F411 Generalized anxiety disorder: Secondary | ICD-10-CM | POA: Diagnosis not present

## 2019-11-16 ENCOUNTER — Telehealth: Payer: Self-pay | Admitting: *Deleted

## 2019-11-17 ENCOUNTER — Inpatient Hospital Stay: Payer: Medicare Other

## 2019-11-17 NOTE — Telephone Encounter (Signed)
Patient requested Dr. Irene Limbo OV notes/labs from [facility] 12/9/20202 be sent to Dr. Richmond Campbell (GI w/ [facility]). Faxed to [phone] - # provided by recording at office # [phone]. Fax confirmation received. Patient clarified appointments for iron infusion - Appts are monthly x4 (per 12/9 los) beginning 11/20/19.

## 2019-11-20 ENCOUNTER — Inpatient Hospital Stay: Payer: Medicare Other | Attending: Hematology

## 2019-11-20 ENCOUNTER — Inpatient Hospital Stay: Payer: Medicare Other

## 2019-11-20 ENCOUNTER — Other Ambulatory Visit: Payer: Self-pay

## 2019-11-20 VITALS — BP 141/87 | HR 77 | Temp 98.6°F | Resp 18

## 2019-11-20 DIAGNOSIS — D500 Iron deficiency anemia secondary to blood loss (chronic): Secondary | ICD-10-CM

## 2019-11-20 DIAGNOSIS — Z8719 Personal history of other diseases of the digestive system: Secondary | ICD-10-CM | POA: Insufficient documentation

## 2019-11-20 DIAGNOSIS — K922 Gastrointestinal hemorrhage, unspecified: Secondary | ICD-10-CM

## 2019-11-20 LAB — CBC WITH DIFFERENTIAL/PLATELET
Abs Immature Granulocytes: 0.02 10*3/uL (ref 0.00–0.07)
Basophils Absolute: 0.1 10*3/uL (ref 0.0–0.1)
Basophils Relative: 1 %
Eosinophils Absolute: 0.3 10*3/uL (ref 0.0–0.5)
Eosinophils Relative: 6 %
HCT: 40 % (ref 36.0–46.0)
Hemoglobin: 12.4 g/dL (ref 12.0–15.0)
Immature Granulocytes: 0 %
Lymphocytes Relative: 24 %
Lymphs Abs: 1.1 10*3/uL (ref 0.7–4.0)
MCH: 31.4 pg (ref 26.0–34.0)
MCHC: 31 g/dL (ref 30.0–36.0)
MCV: 101.3 fL — ABNORMAL HIGH (ref 80.0–100.0)
Monocytes Absolute: 0.4 10*3/uL (ref 0.1–1.0)
Monocytes Relative: 9 %
Neutro Abs: 2.7 10*3/uL (ref 1.7–7.7)
Neutrophils Relative %: 60 %
Platelets: 205 10*3/uL (ref 150–400)
RBC: 3.95 MIL/uL (ref 3.87–5.11)
RDW: 19.9 % — ABNORMAL HIGH (ref 11.5–15.5)
WBC: 4.5 10*3/uL (ref 4.0–10.5)
nRBC: 0 % (ref 0.0–0.2)

## 2019-11-20 LAB — IRON AND TIBC
Iron: 59 ug/dL (ref 41–142)
Saturation Ratios: 15 % — ABNORMAL LOW (ref 21–57)
TIBC: 391 ug/dL (ref 236–444)
UIBC: 332 ug/dL (ref 120–384)

## 2019-11-20 LAB — FERRITIN: Ferritin: 124 ng/mL (ref 11–307)

## 2019-11-20 MED ORDER — ACETAMINOPHEN 325 MG PO TABS
650.0000 mg | ORAL_TABLET | Freq: Once | ORAL | Status: AC
  Administered 2019-11-20: 650 mg via ORAL

## 2019-11-20 MED ORDER — LORATADINE 10 MG PO TABS
ORAL_TABLET | ORAL | Status: AC
  Filled 2019-11-20: qty 1

## 2019-11-20 MED ORDER — SODIUM CHLORIDE 0.9 % IV SOLN
750.0000 mg | Freq: Once | INTRAVENOUS | Status: AC
  Administered 2019-11-20: 750 mg via INTRAVENOUS
  Filled 2019-11-20: qty 15

## 2019-11-20 MED ORDER — ACETAMINOPHEN 325 MG PO TABS
ORAL_TABLET | ORAL | Status: AC
  Filled 2019-11-20: qty 2

## 2019-11-20 MED ORDER — SODIUM CHLORIDE 0.9 % IV SOLN
INTRAVENOUS | Status: DC
  Filled 2019-11-20: qty 250

## 2019-11-20 MED ORDER — LORATADINE 10 MG PO TABS
10.0000 mg | ORAL_TABLET | Freq: Once | ORAL | Status: AC
  Administered 2019-11-20: 10 mg via ORAL

## 2019-11-20 NOTE — Patient Instructions (Signed)

## 2019-11-22 DIAGNOSIS — Z23 Encounter for immunization: Secondary | ICD-10-CM | POA: Diagnosis not present

## 2019-11-29 DIAGNOSIS — F411 Generalized anxiety disorder: Secondary | ICD-10-CM | POA: Diagnosis not present

## 2019-12-04 DIAGNOSIS — D500 Iron deficiency anemia secondary to blood loss (chronic): Secondary | ICD-10-CM | POA: Diagnosis not present

## 2019-12-04 DIAGNOSIS — K5520 Angiodysplasia of colon without hemorrhage: Secondary | ICD-10-CM | POA: Diagnosis not present

## 2019-12-13 ENCOUNTER — Encounter: Payer: Self-pay | Admitting: Hematology

## 2019-12-13 ENCOUNTER — Encounter: Payer: Self-pay | Admitting: *Deleted

## 2019-12-14 DIAGNOSIS — F411 Generalized anxiety disorder: Secondary | ICD-10-CM | POA: Diagnosis not present

## 2019-12-19 DIAGNOSIS — Z23 Encounter for immunization: Secondary | ICD-10-CM | POA: Diagnosis not present

## 2019-12-21 ENCOUNTER — Inpatient Hospital Stay: Payer: Medicare Other | Admitting: Hematology

## 2019-12-21 ENCOUNTER — Inpatient Hospital Stay: Payer: Medicare Other

## 2019-12-21 ENCOUNTER — Telehealth: Payer: Self-pay | Admitting: *Deleted

## 2019-12-21 NOTE — Telephone Encounter (Signed)
Patient called office to cancel all of appts on 2/11 - patient had 2nd Covid vax 2 days ago and is experiencing side effects. She does not feel well enough to come to office. Appointments for today cancelled and schedule message sent

## 2019-12-25 ENCOUNTER — Telehealth: Payer: Self-pay | Admitting: Hematology

## 2019-12-25 NOTE — Telephone Encounter (Signed)
Re-Scheduled appt per 2/11 sch message- pt aware of appt date and time

## 2019-12-26 ENCOUNTER — Other Ambulatory Visit: Payer: Self-pay | Admitting: *Deleted

## 2019-12-27 ENCOUNTER — Inpatient Hospital Stay: Payer: Medicare Other

## 2019-12-27 ENCOUNTER — Other Ambulatory Visit: Payer: Self-pay

## 2019-12-27 ENCOUNTER — Inpatient Hospital Stay (HOSPITAL_BASED_OUTPATIENT_CLINIC_OR_DEPARTMENT_OTHER): Payer: Medicare Other | Admitting: Hematology

## 2019-12-27 ENCOUNTER — Inpatient Hospital Stay: Payer: Medicare Other | Attending: Hematology

## 2019-12-27 ENCOUNTER — Other Ambulatory Visit: Payer: Self-pay | Admitting: *Deleted

## 2019-12-27 VITALS — BP 139/88 | HR 92 | Temp 98.4°F | Resp 18 | Ht 62.0 in | Wt 209.2 lb

## 2019-12-27 VITALS — BP 116/65 | HR 73 | Resp 18

## 2019-12-27 DIAGNOSIS — Z8719 Personal history of other diseases of the digestive system: Secondary | ICD-10-CM | POA: Diagnosis not present

## 2019-12-27 DIAGNOSIS — Z79899 Other long term (current) drug therapy: Secondary | ICD-10-CM | POA: Diagnosis not present

## 2019-12-27 DIAGNOSIS — R634 Abnormal weight loss: Secondary | ICD-10-CM | POA: Insufficient documentation

## 2019-12-27 DIAGNOSIS — R197 Diarrhea, unspecified: Secondary | ICD-10-CM | POA: Insufficient documentation

## 2019-12-27 DIAGNOSIS — D500 Iron deficiency anemia secondary to blood loss (chronic): Secondary | ICD-10-CM

## 2019-12-27 DIAGNOSIS — K921 Melena: Secondary | ICD-10-CM | POA: Insufficient documentation

## 2019-12-27 DIAGNOSIS — K254 Chronic or unspecified gastric ulcer with hemorrhage: Secondary | ICD-10-CM | POA: Diagnosis not present

## 2019-12-27 DIAGNOSIS — K449 Diaphragmatic hernia without obstruction or gangrene: Secondary | ICD-10-CM | POA: Diagnosis not present

## 2019-12-27 DIAGNOSIS — M1990 Unspecified osteoarthritis, unspecified site: Secondary | ICD-10-CM | POA: Insufficient documentation

## 2019-12-27 DIAGNOSIS — K922 Gastrointestinal hemorrhage, unspecified: Secondary | ICD-10-CM

## 2019-12-27 LAB — IRON AND TIBC
Iron: 67 ug/dL (ref 41–142)
Saturation Ratios: 17 % — ABNORMAL LOW (ref 21–57)
TIBC: 399 ug/dL (ref 236–444)
UIBC: 331 ug/dL (ref 120–384)

## 2019-12-27 LAB — CBC WITH DIFFERENTIAL/PLATELET
Abs Immature Granulocytes: 0.01 10*3/uL (ref 0.00–0.07)
Basophils Absolute: 0.1 10*3/uL (ref 0.0–0.1)
Basophils Relative: 1 %
Eosinophils Absolute: 0.2 10*3/uL (ref 0.0–0.5)
Eosinophils Relative: 3 %
HCT: 39.4 % (ref 36.0–46.0)
Hemoglobin: 12.8 g/dL (ref 12.0–15.0)
Immature Granulocytes: 0 %
Lymphocytes Relative: 25 %
Lymphs Abs: 1.7 10*3/uL (ref 0.7–4.0)
MCH: 32.2 pg (ref 26.0–34.0)
MCHC: 32.5 g/dL (ref 30.0–36.0)
MCV: 99.2 fL (ref 80.0–100.0)
Monocytes Absolute: 0.7 10*3/uL (ref 0.1–1.0)
Monocytes Relative: 10 %
Neutro Abs: 4.1 10*3/uL (ref 1.7–7.7)
Neutrophils Relative %: 61 %
Platelets: 233 10*3/uL (ref 150–400)
RBC: 3.97 MIL/uL (ref 3.87–5.11)
RDW: 14.6 % (ref 11.5–15.5)
WBC: 6.6 10*3/uL (ref 4.0–10.5)
nRBC: 0 % (ref 0.0–0.2)

## 2019-12-27 LAB — FERRITIN: Ferritin: 76 ng/mL (ref 11–307)

## 2019-12-27 MED ORDER — ACETAMINOPHEN 325 MG PO TABS
650.0000 mg | ORAL_TABLET | Freq: Once | ORAL | Status: AC
  Administered 2019-12-27: 650 mg via ORAL

## 2019-12-27 MED ORDER — SODIUM CHLORIDE 0.9 % IV SOLN
INTRAVENOUS | Status: DC
  Filled 2019-12-27: qty 250

## 2019-12-27 MED ORDER — SODIUM CHLORIDE 0.9 % IV SOLN
750.0000 mg | Freq: Once | INTRAVENOUS | Status: AC
  Administered 2019-12-27: 750 mg via INTRAVENOUS
  Filled 2019-12-27: qty 15

## 2019-12-27 MED ORDER — ACETAMINOPHEN 325 MG PO TABS
ORAL_TABLET | ORAL | Status: AC
  Filled 2019-12-27: qty 2

## 2019-12-27 NOTE — Patient Instructions (Signed)

## 2019-12-27 NOTE — Progress Notes (Signed)
HEMATOLOGY/ONCOLOGY CLINIC NOTE  Date of Service: 12/27/2019  Patient Care Team: [name], MD as PCP - General (Internal Medicine)  CHIEF COMPLAINTS/PURPOSE OF CONSULTATION:   F/u for iron deficiency Anemia   HISTORY OF PRESENTING ILLNESS:  Molly Lucas is a wonderful 74 y.o. female who has been referred to [place] by her PCP Dr. Bevelyn Buckles for evaluation and management of weight loss with elevated Hemoglobin and Hematocrit and hypercalcemia. She presents to the clinic today accompanied by her son.   She notes she has lost weight recently but this was due to her trouble eating. She has seen GI Dr. Thana Farr and had a endoscopy last week which showed a hiatal hernia which made it hard to swallow. A biopsy was done as well. She was given Prilosec and her eating has recently improved. She has had 3 meals in the past few days. For the past few months she had no appetite. She has lost 15 pounds over the last 2 months.  She notes she is prone to being anemic. She has had anemia episodes 3 times in the past few years. First time in 2014 she was treated with IV Feraheme. Another time in 11/2015 she had a ulcer with GI bleeding and her Hg went down to 4 and she was given blood transfusion. In 10/2017 she her Hg went down to again and was again treated with IV Feraheme. Her last colonoscopy was 2 years ago and was clear from polyps or concerns. She notes having black stool only once since her ulcer due to Pepto bismol. She notes she has not been on oral calcium for 2-3 months. She denies back pain, bone pain or kidney stones.   Today she notes family stress. She has had a mammogram in the last 2 years. She now lives in a retirement community.  She notes she had fluid around her heart once and went to see cardiologist who cleared her.  On review of symptoms, pt notes tightness in chest resolved with Prilosec. She denies back pain or bone pain. She has decreased appetite still. She notes  since not eating much she has felt feverish and moderately sweaty. This has not occurred in the past few days. She notes her bowel movements are random because she is not currently eating much. She is ale to pass urine fine. She denies cough, chest pain or difficulty breathing. She notes stress fracture b/w L3 and L4 which has arthritis around it.    INTERVAL HISTORY:  Molly Lucas is a 74 y.o. female here for evaluation and management of anemia. The patient's last visit with [place] was on 10/18/2019. The pt reports that she is doing well overall.  The pt reports that she had an episode of diarrhea with black stools in January. She has noticed darker stools about once per week ongoing. Pt has felt an increase in energy since receiving her IV iron transfusions. Dr. Earlie Raveling is currently working on getting Sandostatin approved for treatment. Pt has had both doses of the COVID19 vaccine.   Lab results today (12/27/19) of CBC w/diff is as follows: all values are WNL. 12/27/2019 Iron and TIBC is as follows: Iron at 67, TIBC at 399, Sat Ratios at 17, UIBC at 311  12/27/2019 Ferritin at 76  On review of systems, pt reports melena and denies abdominal pain, fatigue, bloody stools and any other symptoms.   MEDICAL HISTORY:  Past Medical History:  Diagnosis Date  . Anemia  Takes iron supplement  . Anxiety   . Arthritis   . Chronic pain of left thumb   . Constipation   . 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 Depression   . Fibroid, uterine   . GERD (gastroesophageal reflux disease)   . History of blood transfusion   . Hx of seasonal allergies   . Hyperlipemia   . Hypertension    hx of off medication now.  . OA (osteoarthritis) of hip    left  . Sleep apnea    dentist made mouthipiece     SURGICAL HISTORY: Past Surgical History:  Procedure Laterality Date  . BIOPSY  03/30/2019   Procedure: BIOPSY;  Surgeon: Thornton Park, MD;   Location: [facility];  Service: Gastroenterology;;  . [place]  08/2015   with abductor pollicis longus tendon transfer and suspensionplasty  . COLONOSCOPY WITH PROPOFOL N/A 03/31/2019   Procedure: COLONOSCOPY WITH PROPOFOL;  Surgeon: Thornton Park, MD;  Location: [facility];  Service: Gastroenterology;  Laterality: N/A;  . DE QUERVAIN'S RELEASE  08/2015  . DILATATION & CURRETTAGE/HYSTEROSCOPY WITH RESECTOCOPE  2007  . ESOPHAGOGASTRODUODENOSCOPY N/A 11/30/2015   Procedure: ESOPHAGOGASTRODUODENOSCOPY (EGD);  Surgeon: Richmond Campbell, MD;  Location: [facility];  Service: Endoscopy;  Laterality: N/A;  . ESOPHAGOGASTRODUODENOSCOPY (EGD) WITH PROPOFOL N/A 03/30/2019   Procedure: ESOPHAGOGASTRODUODENOSCOPY (EGD) WITH PROPOFOL;  Surgeon: Thornton Park, MD;  Location: [facility];  Service: Gastroenterology;  Laterality: N/A;  . HERNIA REPAIR Left 06/2012  . INGUINAL HERNIA REPAIR Right 05/03/2013   Procedure: HERNIA REPAIR INGUINAL ADULT;  Surgeon: Harl Bowie, MD;  Location: [facility];  Service: General;  Laterality: Right;  . INGUINAL HERNIA REPAIR Right 08/23/2013   Procedure: LAPAROSCOPIC INGUINAL HERNIA;  Surgeon: Harl Bowie, MD;  Location: [facility];  Service: General;  Laterality: Right;  . INSERTION OF MESH Right 05/03/2013   Procedure: INSERTION OF MESH;  Surgeon: Harl Bowie, MD;  Location: [facility];  Service: General;  Laterality: Right;  . INSERTION OF MESH Right 08/23/2013   Procedure: INSERTION OF MESH;  Surgeon: Harl Bowie, MD;  Location: [facility];  Service: General;  Laterality: Right;  . METACARPOPHALANGEAL JOINT ARTHRODESIS  08/2015   with local bone graft  . TONSILLECTOMY    . TOTAL HIP ARTHROPLASTY  06/24/2012   Procedure: TOTAL HIP ARTHROPLASTY ANTERIOR APPROACH;  Surgeon: Mcarthur Rossetti, MD;  Location: [facility];  Service: Orthopedics;  Laterality: Left;  Left total hip arthroplasty, anterior approach  . UTERINE FIBROID  SURGERY  2007  . WISDOM TOOTH EXTRACTION      SOCIAL HISTORY: Social History   Socioeconomic History  . Marital status: Divorced    Spouse name: Not on file  . Number of children: Not on file  . Years of education: Not on file  . Highest education level: Not on file  Occupational History  . Not on file  Tobacco Use  . Smoking status: Never Smoker  . Smokeless tobacco: Never Used  Substance and Sexual Activity  . Alcohol use: Yes    Alcohol/week: 2.0 standard drinks    Types: 2 Glasses of wine per week    Comment: occasional glass of wine   . Drug use: No  . Sexual activity: Not on file  Other Topics Concern  . Not on file  Social History Narrative  . Not on file   Social Determinants of Health   Financial Resource Strain:   .  Difficulty of Paying Living Expenses: Not on file  Food Insecurity:   . Worried About Charity fundraiser in the Last Year: Not on file  . Ran Out of Food in the Last Year: Not on file  Transportation Needs:   . Lack of Transportation (Medical): Not on file  . Lack of Transportation (Non-Medical): Not on file  Physical Activity:   . Days of Exercise per Week: Not on file  . Minutes of Exercise per Session: Not on file  Stress:   . Feeling of Stress : Not on file  Social Connections:   . Frequency of Communication with Friends and Family: Not on file  . Frequency of Social Gatherings with Friends and Family: Not on file  . Attends Religious Services: Not on file  . Active Member of Clubs or Organizations: Not on file  . Attends Archivist Meetings: Not on file  . Marital Status: Not on file  Intimate Partner Violence:   . Fear of Current or Ex-Partner: Not on file  . Emotionally Abused: Not on file  . Physically Abused: Not on file  . Sexually Abused: Not on file    FAMILY HISTORY: Family History  Problem Relation Age of Onset  . Heart failure Mother   . Osteoporosis Mother   . Arthritis Mother   . CAD Maternal  Grandfather   . Heart failure Maternal Grandfather   . Dementia Maternal Grandfather   . CAD Paternal Grandmother   . CAD Paternal Grandfather   . Arthritis Sister     ALLERGIES:  is allergic to penicillins.  MEDICATIONS:  Current Outpatient Medications  Medication Sig Dispense Refill  . CALCIUM PO Take 2 tablets by mouth daily.     . cetirizine (ZYRTEC) 10 MG tablet Take 10 mg by mouth daily.    . Cholecalciferol (VITAMIN D3) 250 MCG (10000 UT) TABS Take 10,000 Units by mouth daily.     . Cinnamon 500 MG capsule Take 1,000 mg by mouth daily.     . CYANOCOBALAMIN PO Take by mouth.    . Doxepin HCl (SILENOR) 6 MG TABS Take 6 mg by mouth at bedtime.     . DULoxetine (CYMBALTA) 60 MG capsule Take 60 mg by mouth at bedtime.     . fluticasone (FLONASE) 50 MCG/ACT nasal spray Place 2 sprays into both nostrils daily.    . Multiple Vitamins-Minerals (HAIR SKIN AND NAILS FORMULA PO) Take 1 tablet by mouth daily.    . multivitamin-lutein (OCUVITE-LUTEIN) CAPS Take 1 capsule by mouth daily.    . NON FORMULARY Take 1 capsule by mouth See admin instructions. Black [place] oil capsules: Take 1 capsule by mouth once a day    . Omega-3 1000 MG CAPS Take 2,000 mg by mouth daily.     . pravastatin (PRAVACHOL) 20 MG tablet Take 20 mg by mouth at bedtime.     . Turmeric 500 MG CAPS Take 500 mg by mouth daily.     . vitamin C (ASCORBIC ACID) 500 MG tablet Take 500 mg by mouth daily.     No current facility-administered medications for this visit.   Facility-Administered Medications Ordered in Other Visits  Medication Dose Route Frequency Provider Last Rate Last Admin  . 0.9 %  sodium chloride infusion   Intravenous Continuous Brunetta Genera, MD 20 mL/hr at 12/27/19 1500 New Bag at 12/27/19 1500    REVIEW OF SYSTEMS: A 10+ POINT REVIEW OF SYSTEMS WAS OBTAINED including neurology, dermatology, psychiatry,  cardiac, respiratory, lymph, extremities, GI, GU, Musculoskeletal, constitutional, breasts,  reproductive, HEENT.  All pertinent positives are noted in the HPI.  All others are negative.   PHYSICAL EXAMINATION: ECOG FS:2 - Symptomatic, <50% confined to bed  Vitals:   12/27/19 1412  BP: 139/88  Pulse: 92  Resp: 18  Temp: 98.4 F (36.9 C)  SpO2: 98%   Wt Readings from Last 3 Encounters:  12/27/19 209 lb 3.2 oz (94.9 kg)  10/30/19 206 lb (93.4 kg)  10/18/19 207 lb 14.4 oz (94.3 kg)   Body mass index is 38.26 kg/m.    Exam was given in a chair   GENERAL:alert, in no acute distress and comfortable SKIN: no acute rashes, no significant lesions EYES: conjunctiva are pink and non-injected, sclera anicteric OROPHARYNX: MMM, no exudates, no oropharyngeal erythema or ulceration NECK: supple, no JVD LYMPH:  no palpable lymphadenopathy in the cervical, axillary or inguinal regions LUNGS: clear to auscultation b/l with normal respiratory effort HEART: regular rate & rhythm ABDOMEN:  normoactive bowel sounds , non tender, not distended. No palpable hepatosplenomegaly.  Extremity: no pedal edema PSYCH: alert & oriented x 3 with fluent speech NEURO: no focal motor/sensory deficits  LABORATORY DATA:  I have reviewed the data as listed  . CBC Latest Ref Rng & Units 12/27/2019 11/20/2019 10/18/2019  WBC 4.0 - 10.5 K/uL 6.6 4.5 4.9  Hemoglobin 12.0 - 15.0 g/dL 12.8 12.4 9.1(L)  Hematocrit 36.0 - 46.0 % 39.4 40.0 31.4(L)  Platelets 150 - 400 K/uL 233 205 212    . CMP Latest Ref Rng & Units 07/29/2019 07/27/2019 05/20/2019  Glucose 70 - 99 mg/dL 103(H) 109(H) 95  BUN 8 - 23 mg/dL 11 25(H) 19  Creatinine 0.44 - 1.00 mg/dL 0.70 0.78 0.69  Sodium 135 - 145 mmol/L 140 135 141  Potassium 3.5 - 5.1 mmol/L 4.1 3.8 3.9  Chloride 98 - 111 mmol/L 107 105 111  CO2 22 - 32 mmol/L 26 22 23   Calcium 8.9 - 10.3 mg/dL 9.4 9.8 9.1  Total Protein 6.5 - 8.1 g/dL - 5.9(L) -  Total Bilirubin 0.3 - 1.2 mg/dL - 0.6 -  Alkaline Phos 38 - 126 U/L - 69 -  AST 15 - 41 U/L - 20 -  ALT 0 - 44 U/L - 22 -    . Lab Results  Component Value Date   IRON 67 12/27/2019   TIBC 399 12/27/2019   IRONPCTSAT 17 (L) 12/27/2019   (Iron and TIBC)  Lab Results  Component Value Date   FERRITIN 76 12/27/2019   Component     Latest Ref Rng & Units 02/01/2018  IgG (Immunoglobin G), Serum     700 - 1,600 mg/dL 864  IgA     64 - 422 mg/dL 194  IgM (Immunoglobulin M), Srm     26 - 217 mg/dL 71  Total Protein ELP     6.0 - 8.5 g/dL 6.8  Albumin SerPl Elph-Mcnc     2.9 - 4.4 g/dL 3.8  Alpha 1     0.0 - 0.4 g/dL 0.3  Alpha2 Glob SerPl Elph-Mcnc     0.4 - 1.0 g/dL 0.7  B-Globulin SerPl Elph-Mcnc     0.7 - 1.3 g/dL 1.1  Gamma Glob SerPl Elph-Mcnc     0.4 - 1.8 g/dL 0.9  M Protein SerPl Elph-Mcnc     Not Observed g/dL Not Observed  Globulin, Total     2.2 - 3.9 g/dL 3.0  Albumin/Glob SerPl  0.7 - 1.7 1.3  IFE 1      Comment  Please Note (HCV):      Comment  PTH, Intact     15 - 65 pg/mL 56  Calcium, Total (PTH)     8.7 - 10.3 mg/dL 10.9 (H)  PTH Interp      Comment  Kappa free light chain     3.3 - 19.4 mg/L 23.4 (H)  Lamda free light chains     5.7 - 26.3 mg/L 10.3  Kappa, lamda light chain ratio     0.26 - 1.65 2.27 (H)  Beta-2 Microglobulin     0.6 - 2.4 mg/L 2.9 (H)  Sed Rate     0 - 22 mm/hr 2  LDH     125 - 245 U/L 169    OUTSIDE LABS            With Ferritin of 5.7         RADIOGRAPHIC STUDIES: I have personally reviewed the radiological images as listed and agreed with the findings in the report. No results found.  ASSESSMENT & PLAN:  Molly Lucas is a 74 y.o. Caucasian female with  1. Severe iron deficiency Anemia due to GI bleeding  2. Hiatal Hernia with h/o cameron ulcers with previous recurrent Iron deficiency needing IV iron replacement in the past. -Found on GI workup and likely cause of GI bleeding leading to anemia.   3. Recurrent Anemia - secondary to GI blood loss -in 2014 treated with IV Feraheme, 2017 after GI ulcer and  was treated with Blood transfusion, 11/2016 treated with IV Feraheme.   . Lab Results  Component Value Date   IRON 67 12/27/2019   TIBC 399 12/27/2019   IRONPCTSAT 17 (L) 12/27/2019   (Iron and TIBC)  Lab Results  Component Value Date   FERRITIN 76 12/27/2019   4. Weight loss - 15 pounds over 2 months -secondary to GI issues  -Improved eating with Prilosec 40mg  once a day  5. H/o Elevated Calcium at 11.7 rpt today improved to 11 PTH - WNL Myeloma panel - no M spike and neg IFE K/L ratio minimally abnormal but only minimal increase in Kappa light chains B2MG  -borderline elevated.   PLAN:  -Discussed pt labwork today, 12/27/19; anemia has corrected, blood counts are nml with aggressive IV Iron replacement. -Discussed 12/27/2019 Iron and TIBC is as follows: all values are WNL except for Sat Ratios at 17  -Discussed 12/27/2019 Ferritin is WNL but continues to drop -Pt is continuing to bleed from her GI tract as evidenced by her dropping Ferritin levels despite aggressive IV Iron replacement. -Discussed continuing monthly IV Injectafer due to pt's bleeding risk - pt agrees -Recommend pt f/u for second dose of COVID19 vaccine as scheduled -Dr. Earlie Raveling is currently working on considering  LA Sandostatin, may need to consider +/- Lysteda as well to slow down GI bleeding -Continue f/u with Dr. Earlie Raveling -Will give IV Injectafer today   FOLLOW UP: F/u for labs and IV Iron as scheduled on 3/11 RTC with Dr Irene Limbo, labs and IV iron on 4/12  The total time spent in the appt was 20 minutes and more than 50% was on counseling and direct patient cares.  All of the patient's questions were answered with apparent satisfaction. The patient knows to call the clinic with any problems, questions or concerns.   Sullivan Lone MD [place] AAHIVMS [facility] Hematology/Oncology Physician [place]  (Office):  [phone] (Work cell):  [phone] (Fax):            [phone]  12/27/2019 4:33 PM  I, Yevette Edwards, am acting as a scribe for Dr. Sullivan Lone.   .I have reviewed the above documentation for accuracy and completeness, and I agree with the above. Brunetta Genera MD

## 2019-12-29 ENCOUNTER — Telehealth: Payer: Self-pay | Admitting: Hematology

## 2019-12-29 NOTE — Telephone Encounter (Signed)
Scheduled per 02/17 los, patient voicemail is full.

## 2020-01-18 ENCOUNTER — Inpatient Hospital Stay: Payer: Medicare Other

## 2020-01-19 ENCOUNTER — Inpatient Hospital Stay: Payer: Medicare Other

## 2020-01-19 ENCOUNTER — Other Ambulatory Visit: Payer: Self-pay

## 2020-01-19 ENCOUNTER — Inpatient Hospital Stay: Payer: Medicare Other | Attending: Hematology

## 2020-01-19 VITALS — BP 127/91 | HR 86 | Temp 97.8°F | Resp 18

## 2020-01-19 DIAGNOSIS — K922 Gastrointestinal hemorrhage, unspecified: Secondary | ICD-10-CM

## 2020-01-19 DIAGNOSIS — D500 Iron deficiency anemia secondary to blood loss (chronic): Secondary | ICD-10-CM | POA: Diagnosis not present

## 2020-01-19 DIAGNOSIS — K254 Chronic or unspecified gastric ulcer with hemorrhage: Secondary | ICD-10-CM | POA: Diagnosis not present

## 2020-01-19 LAB — CBC WITH DIFFERENTIAL/PLATELET
Abs Immature Granulocytes: 0.02 10*3/uL (ref 0.00–0.07)
Basophils Absolute: 0.1 10*3/uL (ref 0.0–0.1)
Basophils Relative: 1 %
Eosinophils Absolute: 0.2 10*3/uL (ref 0.0–0.5)
Eosinophils Relative: 4 %
HCT: 46.2 % — ABNORMAL HIGH (ref 36.0–46.0)
Hemoglobin: 14.8 g/dL (ref 12.0–15.0)
Immature Granulocytes: 0 %
Lymphocytes Relative: 25 %
Lymphs Abs: 1.5 10*3/uL (ref 0.7–4.0)
MCH: 33.1 pg (ref 26.0–34.0)
MCHC: 32 g/dL (ref 30.0–36.0)
MCV: 103.4 fL — ABNORMAL HIGH (ref 80.0–100.0)
Monocytes Absolute: 0.5 10*3/uL (ref 0.1–1.0)
Monocytes Relative: 8 %
Neutro Abs: 3.6 10*3/uL (ref 1.7–7.7)
Neutrophils Relative %: 62 %
Platelets: 203 10*3/uL (ref 150–400)
RBC: 4.47 MIL/uL (ref 3.87–5.11)
RDW: 14.6 % (ref 11.5–15.5)
WBC: 5.9 10*3/uL (ref 4.0–10.5)
nRBC: 0 % (ref 0.0–0.2)

## 2020-01-19 MED ORDER — SODIUM CHLORIDE 0.9 % IV SOLN
750.0000 mg | Freq: Once | INTRAVENOUS | Status: AC
  Administered 2020-01-19: 750 mg via INTRAVENOUS
  Filled 2020-01-19: qty 15

## 2020-01-19 MED ORDER — SODIUM CHLORIDE 0.9 % IV SOLN
Freq: Once | INTRAVENOUS | Status: AC
  Filled 2020-01-19: qty 250

## 2020-01-19 NOTE — Patient Instructions (Signed)

## 2020-01-22 ENCOUNTER — Encounter: Payer: Self-pay | Admitting: Hematology

## 2020-01-22 LAB — IRON AND TIBC
Iron: 98 ug/dL (ref 41–142)
Saturation Ratios: 27 % (ref 21–57)
TIBC: 360 ug/dL (ref 236–444)
UIBC: 262 ug/dL (ref 120–384)

## 2020-01-22 LAB — FERRITIN: Ferritin: 243 ng/mL (ref 11–307)

## 2020-01-24 ENCOUNTER — Telehealth: Payer: Self-pay | Admitting: Hematology

## 2020-01-24 NOTE — Telephone Encounter (Signed)
Called pt per 3/17 sch message - no answer and vmail full. Mailed letter

## 2020-02-15 ENCOUNTER — Other Ambulatory Visit: Payer: Self-pay | Admitting: *Deleted

## 2020-02-15 DIAGNOSIS — F411 Generalized anxiety disorder: Secondary | ICD-10-CM | POA: Diagnosis not present

## 2020-02-15 DIAGNOSIS — D500 Iron deficiency anemia secondary to blood loss (chronic): Secondary | ICD-10-CM

## 2020-02-16 ENCOUNTER — Inpatient Hospital Stay: Payer: Medicare Other

## 2020-02-19 ENCOUNTER — Inpatient Hospital Stay: Payer: Medicare Other | Admitting: Hematology

## 2020-02-19 ENCOUNTER — Inpatient Hospital Stay: Payer: Medicare Other

## 2020-02-19 ENCOUNTER — Other Ambulatory Visit: Payer: Medicare Other

## 2020-02-19 ENCOUNTER — Ambulatory Visit: Payer: Medicare Other

## 2020-02-20 ENCOUNTER — Telehealth: Payer: Self-pay | Admitting: Hematology

## 2020-02-20 NOTE — Telephone Encounter (Signed)
Scheduled appt per 4/12 sch message - unable to reach pt . Left message with appt date and time   

## 2020-02-28 ENCOUNTER — Telehealth: Payer: Self-pay

## 2020-02-28 ENCOUNTER — Inpatient Hospital Stay: Payer: Medicare Other | Admitting: Hematology

## 2020-02-28 ENCOUNTER — Inpatient Hospital Stay: Payer: Medicare Other

## 2020-02-28 NOTE — Telephone Encounter (Signed)
Per Dr. Irene Limbo: hold off on rescheduling this pt. at this time. Melissa in scheduling notified.

## 2020-02-28 NOTE — Telephone Encounter (Signed)
Received call from pt. to cancel and reschedule her Lab, MD visit and Infusion appts. today for a later date and time; she can't make it due to illness. Scheduling message sent; schedulers to call pt. with new date and time of appts.

## 2020-03-13 ENCOUNTER — Non-Acute Institutional Stay: Payer: Medicare Other | Admitting: Internal Medicine

## 2020-03-13 ENCOUNTER — Encounter: Payer: Self-pay | Admitting: Internal Medicine

## 2020-03-13 DIAGNOSIS — F331 Major depressive disorder, recurrent, moderate: Secondary | ICD-10-CM | POA: Diagnosis not present

## 2020-03-13 DIAGNOSIS — Z8719 Personal history of other diseases of the digestive system: Secondary | ICD-10-CM | POA: Diagnosis not present

## 2020-03-13 DIAGNOSIS — D649 Anemia, unspecified: Secondary | ICD-10-CM

## 2020-03-13 DIAGNOSIS — F5104 Psychophysiologic insomnia: Secondary | ICD-10-CM | POA: Diagnosis not present

## 2020-03-13 DIAGNOSIS — G4733 Obstructive sleep apnea (adult) (pediatric): Secondary | ICD-10-CM | POA: Diagnosis not present

## 2020-03-13 DIAGNOSIS — Z8711 Personal history of peptic ulcer disease: Secondary | ICD-10-CM

## 2020-03-13 DIAGNOSIS — K219 Gastro-esophageal reflux disease without esophagitis: Secondary | ICD-10-CM

## 2020-03-13 NOTE — Progress Notes (Signed)
Patient ID: Molly Lucas, female   DOB: 08/28/1946, 74 y.o.   MRN: [id number]  Provider:  Rexene Edison. Mariea Clonts, D.O., C.M.D. Location:  [facility] Room Number: [id number] Place of Service:   [facility] respite room  PCP: Gayland Curry, DO Patient Care Team: Gayland Curry DO as PCP - General (Geriatric Medicine)  Extended Emergency Contact Information Primary Emergency Contact: Chandler,Marilyn Address: [place], [place] Phone: [phone] Mobile Phone: [phone] Relation: Friend Secondary Emergency Contact: Charlette Caffey [place] Mobile Phone: [phone] Relation: Son  Code Status: full code Goals of Care: Advanced Directive information Advanced Directives 03/13/2020  Does Patient Have a Medical Advance Directive? Yes  Type of Advance Directive Living will;Healthcare Power of Attorney  Does patient want to make changes to medical advance directive? No - Patient declined  Copy of [place] in Chart? No - copy requested  Would patient like information on creating a medical advance directive? -  Pre-existing out of facility DNR order (yellow form or pink MOST form) -   Chief Complaint  Patient presents with  . New Admit To [facility]    New admit [facility] respite care    HPI: Patient is a 74 y.o. female with h/o iron deficiency anemia from chronic blood loss, seen today for admission to [facility] respite for medication management and appointment coordination.  She has apparently missed an important appt for an iron infusion with Dr. Irene Limbo.  There are some challenges with her medications--she takes numerous supplements and nursing did not yet have her med list completed even when I saw her.  She saw Dr. Irene Limbo initially on 2/17 for weight loss and elevated h/h per his notes.  She's had challenges eating and has lost weight.  She saw GI and endoscopy revealed a hiatal hernia  and she's had cameron ulcers.  She had lost 15 lbs in 2 months.  She's been prone to anemia--required IV iron and transfusions in the past 2014, 2017 and 2018.  cscope was negative 2 years ago.  Prilosec has helped her chest tightness.  Appetite has not gotten better though since feb.  She continues to eat sporadically though she knows she needs to do better.  She does not move her bowels consistently b/c of her poor intake.  At the 2/21 appt with Dr. Irene Limbo, the plan was for monthly IV injectafer due to her chronic blood loss.  Dr. Earlean Shawl, her GI physician, was considering sandostatin to slow her GI bleeding.  She was to have labs and IV iron 3/12 (had), then see Dr. Irene Limbo and get labs and iron 4/12 (missed).  Pt herself admitted to fatigue and exhaustion.  She has chronic depression and Dr. Toy Care, her psychiatrist, is weaning her off of cymbalta and onto viibryd in hopes this will help her.    She has OA of her hands which was evident on exam with some deformity.  She also has had "stress fxs" of L3-4 and arthritis that cause back pain.    Of note, when she was admitted here to [facility], her weight was down to 180 when she had been 209 in Feb.  She admits to poor appetite but says she knows she has to eat to keep going.  Past Medical History:  Diagnosis Date  . Anemia    Takes iron supplement  . Anxiety   . Arthritis   .  Chronic pain of left thumb   . Constipation   . 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 Depression   . Fibroid, uterine   . GERD (gastroesophageal reflux disease)   . History of blood transfusion   . Hx of seasonal allergies   . Hyperlipemia   . Hypertension    hx of off medication now.  . OA (osteoarthritis) of hip    left  . Sleep apnea    dentist made mouthipiece    Past Surgical History:  Procedure Laterality Date  . BIOPSY  03/30/2019   Procedure: BIOPSY;  Surgeon: Thornton Park, MD;  Location: [facility];  Service:  Gastroenterology;;  . [place]  08/2015   with abductor pollicis longus tendon transfer and suspensionplasty  . COLONOSCOPY WITH PROPOFOL N/A 03/31/2019   Procedure: COLONOSCOPY WITH PROPOFOL;  Surgeon: Thornton Park, MD;  Location: [facility];  Service: Gastroenterology;  Laterality: N/A;  . DE QUERVAIN'S RELEASE  08/2015  . DILATATION & CURRETTAGE/HYSTEROSCOPY WITH RESECTOCOPE  2007  . ESOPHAGOGASTRODUODENOSCOPY N/A 11/30/2015   Procedure: ESOPHAGOGASTRODUODENOSCOPY (EGD);  Surgeon: Richmond Campbell, MD;  Location: [facility];  Service: Endoscopy;  Laterality: N/A;  . ESOPHAGOGASTRODUODENOSCOPY (EGD) WITH PROPOFOL N/A 03/30/2019   Procedure: ESOPHAGOGASTRODUODENOSCOPY (EGD) WITH PROPOFOL;  Surgeon: Thornton Park, MD;  Location: [facility];  Service: Gastroenterology;  Laterality: N/A;  . HERNIA REPAIR Left 06/2012  . INGUINAL HERNIA REPAIR Right 05/03/2013   Procedure: HERNIA REPAIR INGUINAL ADULT;  Surgeon: Harl Bowie, MD;  Location: [facility];  Service: General;  Laterality: Right;  . INGUINAL HERNIA REPAIR Right 08/23/2013   Procedure: LAPAROSCOPIC INGUINAL HERNIA;  Surgeon: Harl Bowie, MD;  Location: [facility];  Service: General;  Laterality: Right;  . INSERTION OF MESH Right 05/03/2013   Procedure: INSERTION OF MESH;  Surgeon: Harl Bowie, MD;  Location: [facility];  Service: General;  Laterality: Right;  . INSERTION OF MESH Right 08/23/2013   Procedure: INSERTION OF MESH;  Surgeon: Harl Bowie, MD;  Location: [facility];  Service: General;  Laterality: Right;  . METACARPOPHALANGEAL JOINT ARTHRODESIS  08/2015   with local bone graft  . TONSILLECTOMY    . TOTAL HIP ARTHROPLASTY  06/24/2012   Procedure: TOTAL HIP ARTHROPLASTY ANTERIOR APPROACH;  Surgeon: Mcarthur Rossetti, MD;  Location: [facility];  Service: Orthopedics;  Laterality: Left;  Left total hip arthroplasty, anterior approach  . UTERINE FIBROID SURGERY  2007  . WISDOM TOOTH  EXTRACTION      Social History   Socioeconomic History  . Marital status: Divorced    Spouse name: Not on file  . Number of children: Not on file  . Years of education: Not on file  . Highest education level: Not on file  Occupational History  . Not on file  Tobacco Use  . Smoking status: Never Smoker  . Smokeless tobacco: Never Used  Substance and Sexual Activity  . Alcohol use: Yes    Alcohol/week: 2.0 standard drinks    Types: 2 Glasses of wine per week    Comment: occasional glass of wine   . Drug use: No  . Sexual activity: Not on file  Other Topics Concern  . Not on file  Social History Narrative  . Not on file   Social Determinants of Health   Financial Resource Strain:   . Difficulty of Paying Living Expenses:   Food Insecurity:   . Worried About [employer] of  Food in the Last Year:   . Reading in the Last Year:   Transportation Needs:   . Film/video editor (Medical):   Marland Kitchen Lack of Transportation (Non-Medical):   Physical Activity:   . Days of Exercise per Week:   . Minutes of Exercise per Session:   Stress:   . Feeling of Stress :   Social Connections:   . Frequency of Communication with Friends and Family:   . Frequency of Social Gatherings with Friends and Family:   . Attends Religious Services:   . Active Member of Clubs or Organizations:   . Attends Archivist Meetings:   Marland Kitchen Marital Status:     reports that she has never smoked. She has never used smokeless tobacco. She reports current alcohol use of about 2.0 standard drinks of alcohol per week. She reports that she does not use drugs.  Functional Status Survey:    Family History  Problem Relation Age of Onset  . Heart failure Mother   . Osteoporosis Mother   . Arthritis Mother   . CAD Maternal Grandfather   . Heart failure Maternal Grandfather   . Dementia Maternal Grandfather   . CAD Paternal Grandmother   . CAD Paternal Grandfather   . Arthritis Sister      Health Maintenance  Topic Date Due  . Hepatitis C Screening  Never done  . TETANUS/TDAP  Never done  . PNA vac Low Risk Adult (1 of 2 - PCV13) Never done  . INFLUENZA VACCINE  06/09/2020  . COLONOSCOPY  03/30/2029  . DEXA SCAN  Completed  . COVID-19 Vaccine  Completed    Allergies  Allergen Reactions  . Penicillins Swelling and Other (See Comments)    Tongue became swollen and turned black Did it involve swelling of the face/tongue/throat, SOB, or low BP? Yes Did it involve sudden or severe rash/hives, skin peeling, or any reaction on the inside of your mouth or nose? Yes Did you need to seek medical attention at a hospital or doctor's office? Yes When did it last happen? "I was a child" If all above answers are "NO", may proceed with cephalosporin use.     Outpatient Encounter Medications as of 03/13/2020  Medication Sig  . CALCIUM PO Take 2 tablets by mouth daily.   . cetirizine (ZYRTEC) 10 MG tablet Take 10 mg by mouth daily.  . Cholecalciferol (VITAMIN D3) 250 MCG (10000 UT) TABS Take 10,000 Units by mouth daily.   . Cinnamon 500 MG capsule Take 1,000 mg by mouth daily.   . CYANOCOBALAMIN PO Take by mouth.  . Doxepin HCl (SILENOR) 6 MG TABS Take 6 mg by mouth at bedtime.   . DULoxetine (CYMBALTA) 60 MG capsule Take 60 mg by mouth at bedtime.   . fluticasone (FLONASE) 50 MCG/ACT nasal spray Place 2 sprays into both nostrils daily.  . Multiple Vitamins-Minerals (HAIR SKIN AND NAILS FORMULA PO) Take 1 tablet by mouth daily.  . multivitamin-lutein (OCUVITE-LUTEIN) CAPS Take 1 capsule by mouth daily.  . NON FORMULARY Take 1 capsule by mouth See admin instructions. Black [place] oil capsules: Take 1 capsule by mouth once a day  . Omega-3 1000 MG CAPS Take 2,000 mg by mouth daily.   . pravastatin (PRAVACHOL) 20 MG tablet Take 20 mg by mouth at bedtime.   . Turmeric 500 MG CAPS Take 500 mg by mouth daily.   . Vilazodone HCl (VIIBRYD) 10 MG TABS Take 10 mg by mouth daily.  Take  with food  . vitamin C (ASCORBIC ACID) 500 MG tablet Take 500 mg by mouth daily.   No facility-administered encounter medications on file as of 03/13/2020.    Review of Systems  Constitutional: Positive for malaise/fatigue and weight loss. Negative for chills and fever.  HENT: Negative for hearing loss.   Eyes: Negative for blurred vision.  Respiratory: Negative for cough and shortness of breath.   Cardiovascular: Negative for chest pain, palpitations and leg swelling.  Gastrointestinal: Positive for blood in stool and melena. Negative for abdominal pain, constipation and diarrhea.  Genitourinary: Negative for dysuria.  Musculoskeletal: Positive for back pain and joint pain. Negative for falls.  Skin: Negative for itching and rash.  Neurological: Positive for weakness. Negative for dizziness and loss of consciousness.  Endo/Heme/Allergies: Bruises/bleeds easily.  Psychiatric/Behavioral: Positive for depression. Negative for memory loss. The patient is nervous/anxious and has insomnia.     Vitals:   03/13/20 1549  BP: 100/70  Pulse: 86  Temp: (!) 97.5 F (36.4 C)  SpO2: 96%  Weight: 180 lb (81.6 kg)  Height: 5' (1.524 m)   Body mass index is 35.15 kg/m. Physical Exam Constitutional:      General: She is not in acute distress.    Appearance: Normal appearance. She is not ill-appearing or toxic-appearing.     Comments: Resting in recliner chair  HENT:     Head: Normocephalic and atraumatic.     Right Ear: External ear normal.     Left Ear: External ear normal.     Nose: Nose normal.     Mouth/Throat:     Pharynx: Oropharynx is clear.  Eyes:     Extraocular Movements: Extraocular movements intact.     Conjunctiva/sclera: Conjunctivae normal.     Pupils: Pupils are equal, round, and reactive to light.  Cardiovascular:     Rate and Rhythm: Normal rate and regular rhythm.     Pulses: Normal pulses.     Heart sounds: Murmur present.  Pulmonary:     Effort: Pulmonary effort  is normal.     Breath sounds: Normal breath sounds. No wheezing, rhonchi or rales.  Abdominal:     General: Bowel sounds are normal. There is no distension.     Palpations: Abdomen is soft.     Tenderness: There is no abdominal tenderness. There is no guarding or rebound.  Musculoskeletal:        General: Normal range of motion.     Cervical back: Neck supple.     Right lower leg: No edema.     Left lower leg: No edema.  Skin:    General: Skin is warm and dry.  Neurological:     General: No focal deficit present.     Mental Status: She is alert and oriented to person, place, and time.     Cranial Nerves: No cranial nerve deficit.     Motor: Weakness present.  Psychiatric:        Mood and Affect: Mood normal.     Comments: Somewhat flat affect, very calm and quiet     Labs reviewed: Basic Metabolic Panel: Recent Labs    05/20/19 0810 07/27/19 1729 07/29/19 0657  NA 141 135 140  K 3.9 3.8 4.1  CL 111 105 107  CO2 23 22 26   GLUCOSE 95 109* 103*  BUN 19 25* 11  CREATININE 0.69 0.78 0.70  CALCIUM 9.1 9.8 9.4   Liver Function Tests: Recent Labs    03/29/19 1241  05/19/19 1908 07/27/19 1729  AST 16 20 20   ALT 19 19 22   ALKPHOS 70 82 69  BILITOT 1.0 0.9 0.6  PROT 6.1* 6.4* 5.9*  ALBUMIN 3.5 3.8 3.5   No results for input(s): LIPASE, AMYLASE in the last 8760 hours. No results for input(s): AMMONIA in the last 8760 hours. CBC: Recent Labs    11/20/19 1058 12/27/19 1326 01/19/20 1456  WBC 4.5 6.6 5.9  NEUTROABS 2.7 4.1 3.6  HGB 12.4 12.8 14.8  HCT 40.0 39.4 46.2*  MCV 101.3* 99.2 103.4*  PLT 205 233 203   Cardiac Enzymes: No results for input(s): CKTOTAL, CKMB, CKMBINDEX, TROPONINI in the last 8760 hours. BNP: Invalid input(s): POCBNP No results found for: HGBA1C Lab Results  Component Value Date   TSH 1.04 04/02/2016   Lab Results  Component Value Date   VITAMINB12 375 08/03/2019   Lab Results  Component Value Date   FOLATE 36.4 05/20/2019    Lab Results  Component Value Date   IRON 98 01/19/2020   TIBC 360 01/19/2020   FERRITIN 243 01/19/2020    Imaging and Procedures obtained prior to SNF admission: DG Chest 2 View  Result Date: 07/27/2019 CLINICAL DATA:  Pt came in with sob and a low blood count,hemoglobin yesterday was 8, has had a past ulcer in her hiatal hernia, and today she is hoping to be admitted for further testing, hx non smoker, no known heart hx, no htn or diabetes EXAM: CHEST - 2 VIEW COMPARISON:  11/29/2015 FINDINGS: Cardiac silhouette is normal in size. Moderate to large hiatal hernia. No mediastinal or hilar masses or evidence of adenopathy. Clear lungs.  No pleural effusion or pneumothorax. Skeletal structures are intact. IMPRESSION: No active cardiopulmonary disease. Electronically Signed   By: Lajean Manes M.D.   On: 07/27/2019 18:12   NM GI Blood Loss  Result Date: 07/28/2019 CLINICAL DATA:  GI bleed. EXAM: NUCLEAR MEDICINE GASTROINTESTINAL BLEEDING SCAN TECHNIQUE: Sequential abdominal images were obtained following intravenous administration of Tc-7m labeled red blood cells. RADIOPHARMACEUTICALS:  25.2 mCi Tc-31m pertechnetate in-vitro labeled red cells. COMPARISON:  None. FINDINGS: Normal radiotracer distribution is noted. There is no pooling of radiotracer within the bowel. IMPRESSION: No active GI bleeding identified on this exam. Electronically Signed   By: Constance Holster M.D.   On: 07/28/2019 19:49    Assessment/Plan 1. Moderate episode of recurrent major depressive disorder ([place]) -ongoing, is being weaned off cymbalta and started on viibryd per her psychiatrist -it will be helpful to be here for med administration during this time  2. Symptomatic anemia -difficult to tell how much of her fatigue is due to #1 vs this from chronic GI bleeding with cameron erosions from hiatal hernia -is to f/u with Dr. Irene Limbo and continue her iron infusions at hematology  3. History of gastric ulcer -has had  several episodes of GI bleeding and iron deficiency requiring transfusions and iron repletion (see hpi)  4. Psychophysiological insomnia -on chronic doxepin through psych   5. OSA (obstructive sleep apnea) -per records, cont cpap per home settings  6. Gastroesophageal reflux disease without esophagitis -oddly she is not on a PPI considering all of her GI bleeding  Family/ staff Communication: discussed with DON  Labs/tests ordered:  No new--per PCP  Verlena Marlette L. Marisol Glazer, D.O. [facility] [address], [place] [address] Cell Phone (Mon-Fri 8am-5pm):  [phone] On Call:  [phone] & follow prompts after 5pm & weekends Office Phone:  [phone] Office Fax:  [phone]

## 2020-03-22 NOTE — Addendum Note (Signed)
Addended by: Gayland Curry on: 03/22/2020 08:48 AM   Modules accepted: Level of Service

## 2020-03-27 DIAGNOSIS — F411 Generalized anxiety disorder: Secondary | ICD-10-CM | POA: Diagnosis not present

## 2020-04-17 DIAGNOSIS — F411 Generalized anxiety disorder: Secondary | ICD-10-CM | POA: Diagnosis not present

## 2020-04-23 ENCOUNTER — Inpatient Hospital Stay: Payer: Medicare Other

## 2020-04-23 ENCOUNTER — Other Ambulatory Visit: Payer: Self-pay

## 2020-04-23 ENCOUNTER — Inpatient Hospital Stay: Payer: Medicare Other | Attending: Hematology | Admitting: Hematology

## 2020-04-23 VITALS — BP 138/95 | HR 90 | Temp 97.6°F | Resp 17 | Ht 60.0 in | Wt 196.1 lb

## 2020-04-23 DIAGNOSIS — D500 Iron deficiency anemia secondary to blood loss (chronic): Secondary | ICD-10-CM

## 2020-04-23 DIAGNOSIS — I10 Essential (primary) hypertension: Secondary | ICD-10-CM | POA: Diagnosis not present

## 2020-04-23 DIAGNOSIS — R634 Abnormal weight loss: Secondary | ICD-10-CM | POA: Diagnosis not present

## 2020-04-23 DIAGNOSIS — M1990 Unspecified osteoarthritis, unspecified site: Secondary | ICD-10-CM | POA: Diagnosis not present

## 2020-04-23 DIAGNOSIS — D509 Iron deficiency anemia, unspecified: Secondary | ICD-10-CM | POA: Insufficient documentation

## 2020-04-23 DIAGNOSIS — K449 Diaphragmatic hernia without obstruction or gangrene: Secondary | ICD-10-CM | POA: Diagnosis not present

## 2020-04-23 DIAGNOSIS — K219 Gastro-esophageal reflux disease without esophagitis: Secondary | ICD-10-CM | POA: Diagnosis not present

## 2020-04-23 DIAGNOSIS — E785 Hyperlipidemia, unspecified: Secondary | ICD-10-CM | POA: Diagnosis not present

## 2020-04-23 DIAGNOSIS — Z79899 Other long term (current) drug therapy: Secondary | ICD-10-CM | POA: Insufficient documentation

## 2020-04-23 DIAGNOSIS — G4730 Sleep apnea, unspecified: Secondary | ICD-10-CM | POA: Insufficient documentation

## 2020-04-23 LAB — CMP (CANCER CENTER ONLY)
ALT: 31 U/L (ref 0–44)
AST: 21 U/L (ref 15–41)
Albumin: 4 g/dL (ref 3.5–5.0)
Alkaline Phosphatase: 128 U/L — ABNORMAL HIGH (ref 38–126)
Anion gap: 8 (ref 5–15)
BUN: 16 mg/dL (ref 8–23)
CO2: 28 mmol/L (ref 22–32)
Calcium: 10.7 mg/dL — ABNORMAL HIGH (ref 8.9–10.3)
Chloride: 107 mmol/L (ref 98–111)
Creatinine: 0.86 mg/dL (ref 0.44–1.00)
GFR, Est AFR Am: >60 mL/min (ref >60)
GFR, Estimated: >60 mL/min (ref >60)
Glucose, Bld: 109 mg/dL — ABNORMAL HIGH (ref 70–99)
Potassium: 4.4 mmol/L (ref 3.5–5.1)
Sodium: 143 mmol/L (ref 135–145)
Total Bilirubin: 1.7 mg/dL — ABNORMAL HIGH (ref 0.3–1.2)
Total Protein: 6.7 g/dL (ref 6.5–8.1)

## 2020-04-23 LAB — CBC WITH DIFFERENTIAL (CANCER CENTER ONLY)
Abs Immature Granulocytes: 0.02 10*3/uL (ref 0.00–0.07)
Basophils Absolute: 0.1 10*3/uL (ref 0.0–0.1)
Basophils Relative: 1 %
Eosinophils Absolute: 0.2 10*3/uL (ref 0.0–0.5)
Eosinophils Relative: 3 %
HCT: 43.9 % (ref 36.0–46.0)
Hemoglobin: 14.4 g/dL (ref 12.0–15.0)
Immature Granulocytes: 0 %
Lymphocytes Relative: 25 %
Lymphs Abs: 1.6 10*3/uL (ref 0.7–4.0)
MCH: 33.2 pg (ref 26.0–34.0)
MCHC: 32.8 g/dL (ref 30.0–36.0)
MCV: 101.2 fL — ABNORMAL HIGH (ref 80.0–100.0)
Monocytes Absolute: 0.5 10*3/uL (ref 0.1–1.0)
Monocytes Relative: 8 %
Neutro Abs: 4 10*3/uL (ref 1.7–7.7)
Neutrophils Relative %: 63 %
Platelet Count: 177 10*3/uL (ref 150–400)
RBC: 4.34 MIL/uL (ref 3.87–5.11)
RDW: 13.6 % (ref 11.5–15.5)
WBC Count: 6.4 10*3/uL (ref 4.0–10.5)
nRBC: 0 % (ref 0.0–0.2)

## 2020-04-23 NOTE — Progress Notes (Signed)
HEMATOLOGY/ONCOLOGY CLINIC NOTE  Date of Service: 04/23/2020  Patient Care Team: Gayland Curry, DO as PCP - General (Geriatric Medicine)  CHIEF COMPLAINTS/PURPOSE OF CONSULTATION:   F/u for iron deficiency Anemia   HISTORY OF PRESENTING ILLNESS:   Molly Lucas is a wonderful 74 y.o. female who has been referred to [place] by her PCP Dr. Bevelyn Buckles for evaluation and management of weight loss with elevated Hemoglobin and Hematocrit and hypercalcemia. She presents to the clinic today accompanied by her son.   She notes she has lost weight recently but this was due to her trouble eating. She has seen GI Dr. Thana Farr and had a endoscopy last week which showed a hiatal hernia which made it hard to swallow. A biopsy was done as well. She was given Prilosec and her eating has recently improved. She has had 3 meals in the past few days. For the past few months she had no appetite. She has lost 15 pounds over the last 2 months.  She notes she is prone to being anemic. She has had anemia episodes 3 times in the past few years. First time in 2014 she was treated with IV Feraheme. Another time in 11/2015 she had a ulcer with GI bleeding and her Hg went down to 4 and she was given blood transfusion. In 10/2017 she her Hg went down to again and was again treated with IV Feraheme. Her last colonoscopy was 2 years ago and was clear from polyps or concerns. She notes having black stool only once since her ulcer due to Pepto bismol. She notes she has not been on oral calcium for 2-3 months. She denies back pain, bone pain or kidney stones.   Today she notes family stress. She has had a mammogram in the last 2 years. She now lives in a retirement community.  She notes she had fluid around her heart once and went to see cardiologist who cleared her.  On review of symptoms, pt notes tightness in chest resolved with Prilosec. She denies back pain or bone pain. She has decreased appetite still. She notes  since not eating much she has felt feverish and moderately sweaty. This has not occurred in the past few days. She notes her bowel movements are random because she is not currently eating much. She is ale to pass urine fine. She denies cough, chest pain or difficulty breathing. She notes stress fracture b/w L3 and L4 which has arthritis around it.    INTERVAL HISTORY: Molly Lucas is a 74 y.o. female here for evaluation and management of anemia. The patient's last visit with [place] was on 12/27/2019. The pt reports that she is doing well overall.  The pt reports that she noticed black stools once in the beginning of April and has not had any abnormal bleeding since. Pt follows with Dr. Thana Farr who decided to hold of on starting the pt on Lysteda at their last visit. She has been feeling well and eating well. She has received both doses of the COVID19 vaccine.   Lab results today (04/23/20) of CBC w/diff and CMP is as follows: all values are WNL except for MCV at 101.2, Glucose at 109, Calcium at 10.7, ALP at 128, Total Bilirubin at 1.7. 04/23/2020 Ferritin is 176 04/23/2020 Iron Panel shows iron saturation of 42%  On review of systems, pt denies abdominal pain, bloody/black stools, discomfort with bowel movements and any other symptoms.   MEDICAL HISTORY:  Past Medical History:  Diagnosis Date  . Anemia    Takes iron supplement  . Anxiety   . Arthritis   . Chronic pain of left thumb   . Constipation   . 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 Depression   . Fibroid, uterine   . GERD (gastroesophageal reflux disease)   . History of blood transfusion   . Hx of seasonal allergies   . Hyperlipemia   . Hypertension    hx of off medication now.  . OA (osteoarthritis) of hip    left  . Sleep apnea    dentist made mouthipiece     SURGICAL HISTORY: Past Surgical History:  Procedure Laterality Date  . BIOPSY  03/30/2019   Procedure: BIOPSY;   Surgeon: Thornton Park, MD;  Location: [place];  Service: Gastroenterology;;  . [place]  08/2015   with abductor pollicis longus tendon transfer and suspensionplasty  . COLONOSCOPY WITH PROPOFOL N/A 03/31/2019   Procedure: COLONOSCOPY WITH PROPOFOL;  Surgeon: Thornton Park, MD;  Location: [place];  Service: Gastroenterology;  Laterality: N/A;  . DE QUERVAIN'S RELEASE  08/2015  . DILATATION & CURRETTAGE/HYSTEROSCOPY WITH RESECTOCOPE  2007  . ESOPHAGOGASTRODUODENOSCOPY N/A 11/30/2015   Procedure: ESOPHAGOGASTRODUODENOSCOPY (EGD);  Surgeon: Richmond Campbell, MD;  Location: [facility];  Service: Endoscopy;  Laterality: N/A;  . ESOPHAGOGASTRODUODENOSCOPY (EGD) WITH PROPOFOL N/A 03/30/2019   Procedure: ESOPHAGOGASTRODUODENOSCOPY (EGD) WITH PROPOFOL;  Surgeon: Thornton Park, MD;  Location: [place];  Service: Gastroenterology;  Laterality: N/A;  . HERNIA REPAIR Left 06/2012  . INGUINAL HERNIA REPAIR Right 05/03/2013   Procedure: HERNIA REPAIR INGUINAL ADULT;  Surgeon: Harl Bowie, MD;  Location: [place];  Service: General;  Laterality: Right;  . INGUINAL HERNIA REPAIR Right 08/23/2013   Procedure: LAPAROSCOPIC INGUINAL HERNIA;  Surgeon: Harl Bowie, MD;  Location: [place];  Service: General;  Laterality: Right;  . INSERTION OF MESH Right 05/03/2013   Procedure: INSERTION OF MESH;  Surgeon: Harl Bowie, MD;  Location: [place];  Service: General;  Laterality: Right;  . INSERTION OF MESH Right 08/23/2013   Procedure: INSERTION OF MESH;  Surgeon: Harl Bowie, MD;  Location: [place];  Service: General;  Laterality: Right;  . METACARPOPHALANGEAL JOINT ARTHRODESIS  08/2015   with local bone graft  . TONSILLECTOMY    . TOTAL HIP ARTHROPLASTY  06/24/2012   Procedure: TOTAL HIP ARTHROPLASTY ANTERIOR APPROACH;  Surgeon: Mcarthur Rossetti, MD;  Location: [facility];  Service: Orthopedics;  Laterality: Left;  Left total hip arthroplasty, anterior  approach  . UTERINE FIBROID SURGERY  2007  . WISDOM TOOTH EXTRACTION      SOCIAL HISTORY: Social History   Socioeconomic History  . Marital status: Divorced    Spouse name: Not on file  . Number of children: Not on file  . Years of education: Not on file  . Highest education level: Not on file  Occupational History  . Not on file  Tobacco Use  . Smoking status: Never Smoker  . Smokeless tobacco: Never Used  Vaping Use  . Vaping Use: Never used  Substance and Sexual Activity  . Alcohol use: Yes    Alcohol/week: 2.0 standard drinks    Types: 2 Glasses of wine per week    Comment: occasional glass of wine   . Drug use: No  . Sexual activity: Not on file  Other Topics Concern  . Not on file  Social History Narrative  .  Not on file   Social Determinants of Health   Financial Resource Strain:   . Difficulty of Paying Living Expenses:   Food Insecurity:   . Worried About Charity fundraiser in the Last Year:   . Arboriculturist in the Last Year:   Transportation Needs:   . Film/video editor (Medical):   Marland Kitchen Lack of Transportation (Non-Medical):   Physical Activity:   . Days of Exercise per Week:   . Minutes of Exercise per Session:   Stress:   . Feeling of Stress :   Social Connections:   . Frequency of Communication with Friends and Family:   . Frequency of Social Gatherings with Friends and Family:   . Attends Religious Services:   . Active Member of Clubs or Organizations:   . Attends Archivist Meetings:   Marland Kitchen Marital Status:   Intimate Partner Violence:   . Fear of Current or Ex-Partner:   . Emotionally Abused:   Marland Kitchen Physically Abused:   . Sexually Abused:     FAMILY HISTORY: Family History  Problem Relation Age of Onset  . Heart failure Mother   . Osteoporosis Mother   . Arthritis Mother   . CAD Maternal Grandfather   . Heart failure Maternal Grandfather   . Dementia Maternal Grandfather   . CAD Paternal Grandmother   . CAD Paternal  Grandfather   . Arthritis Sister     ALLERGIES:  is allergic to penicillins.  MEDICATIONS:  Current Outpatient Medications  Medication Sig Dispense Refill  . CALCIUM PO Take 2 tablets by mouth daily.     . cetirizine (ZYRTEC) 10 MG tablet Take 10 mg by mouth daily.    . Cholecalciferol (VITAMIN D3) 250 MCG (10000 UT) TABS Take 10,000 Units by mouth daily.     . Cinnamon 500 MG capsule Take 1,000 mg by mouth daily.     . CYANOCOBALAMIN PO Take by mouth.    . Doxepin HCl (SILENOR) 6 MG TABS Take 6 mg by mouth at bedtime.     . fluticasone (FLONASE) 50 MCG/ACT nasal spray Place 2 sprays into both nostrils daily.    . Multiple Vitamins-Minerals (HAIR SKIN AND NAILS FORMULA PO) Take 1 tablet by mouth daily.    . multivitamin-lutein (OCUVITE-LUTEIN) CAPS Take 1 capsule by mouth daily.    . NON FORMULARY Take 1 capsule by mouth See admin instructions. Black [place] oil capsules: Take 1 capsule by mouth once a day    . Omega-3 1000 MG CAPS Take 2,000 mg by mouth daily.     . pravastatin (PRAVACHOL) 20 MG tablet Take 20 mg by mouth at bedtime.     . Turmeric 500 MG CAPS Take 500 mg by mouth daily.     . Vilazodone HCl (VIIBRYD) 10 MG TABS Take 40 mg by mouth daily. Take with food     . vitamin C (ASCORBIC ACID) 500 MG tablet Take 500 mg by mouth daily.    . DULoxetine (CYMBALTA) 60 MG capsule Take 60 mg by mouth at bedtime.  (Patient not taking: Reported on 04/23/2020)     No current facility-administered medications for this visit.    REVIEW OF SYSTEMS: A 10+ POINT REVIEW OF SYSTEMS WAS OBTAINED including neurology, dermatology, psychiatry, cardiac, respiratory, lymph, extremities, GI, GU, Musculoskeletal, constitutional, breasts, reproductive, HEENT.  All pertinent positives are noted in the HPI.  All others are negative.   PHYSICAL EXAMINATION: ECOG FS:2 - Symptomatic, <50% confined to bed  Vitals:   04/23/20 1551  BP: (!) 138/95  Pulse: 90  Resp: 17  Temp: 97.6 F (36.4 C)  SpO2:  98%   Wt Readings from Last 3 Encounters:  04/23/20 196 lb 1.6 oz (89 kg)  03/13/20 180 lb (81.6 kg)  12/27/19 209 lb 3.2 oz (94.9 kg)   Body mass index is 38.3 kg/m.    Exam was given in a chair   GENERAL:alert, in no acute distress and comfortable SKIN: no acute rashes, no significant lesions EYES: conjunctiva are pink and non-injected, sclera anicteric OROPHARYNX: MMM, no exudates, no oropharyngeal erythema or ulceration NECK: supple, no JVD LYMPH:  no palpable lymphadenopathy in the cervical, axillary or inguinal regions LUNGS: clear to auscultation b/l with normal respiratory effort HEART: regular rate & rhythm ABDOMEN:  normoactive bowel sounds , non tender, not distended. No palpable hepatosplenomegaly.  Extremity: no pedal edema PSYCH: alert & oriented x 3 with fluent speech NEURO: no focal motor/sensory deficits  LABORATORY DATA:  I have reviewed the data as listed  . CBC Latest Ref Rng & Units 04/23/2020 01/19/2020 12/27/2019  WBC 4.0 - 10.5 K/uL 6.4 5.9 6.6  Hemoglobin 12.0 - 15.0 g/dL 14.4 14.8 12.8  Hematocrit 36 - 46 % 43.9 46.2(H) 39.4  Platelets 150 - 400 K/uL 177 203 233    . CMP Latest Ref Rng & Units 04/23/2020 07/29/2019 07/27/2019  Glucose 70 - 99 mg/dL 109(H) 103(H) 109(H)  BUN 8 - 23 mg/dL 16 11 25(H)  Creatinine 0.44 - 1.00 mg/dL 0.86 0.70 0.78  Sodium 135 - 145 mmol/L 143 140 135  Potassium 3.5 - 5.1 mmol/L 4.4 4.1 3.8  Chloride 98 - 111 mmol/L 107 107 105  CO2 22 - 32 mmol/L 28 26 22   Calcium 8.9 - 10.3 mg/dL 10.7(H) 9.4 9.8  Total Protein 6.5 - 8.1 g/dL 6.7 - 5.9(L)  Total Bilirubin 0.3 - 1.2 mg/dL 1.7(H) - 0.6  Alkaline Phos 38 - 126 U/L 128(H) - 69  AST 15 - 41 U/L 21 - 20  ALT 0 - 44 U/L 31 - 22   . Lab Results  Component Value Date   IRON 140 04/23/2020   TIBC 330 04/23/2020   IRONPCTSAT 42 04/23/2020   (Iron and TIBC)  Lab Results  Component Value Date   FERRITIN 176 04/23/2020   Component     Latest Ref Rng & Units 02/01/2018   IgG (Immunoglobin G), Serum     700 - 1,600 mg/dL 864  IgA     64 - 422 mg/dL 194  IgM (Immunoglobulin M), Srm     26 - 217 mg/dL 71  Total Protein ELP     6.0 - 8.5 g/dL 6.8  Albumin SerPl Elph-Mcnc     2.9 - 4.4 g/dL 3.8  Alpha 1     0.0 - 0.4 g/dL 0.3  Alpha2 Glob SerPl Elph-Mcnc     0.4 - 1.0 g/dL 0.7  B-Globulin SerPl Elph-Mcnc     0.7 - 1.3 g/dL 1.1  Gamma Glob SerPl Elph-Mcnc     0.4 - 1.8 g/dL 0.9  M Protein SerPl Elph-Mcnc     Not Observed g/dL Not Observed  Globulin, Total     2.2 - 3.9 g/dL 3.0  Albumin/Glob SerPl     0.7 - 1.7 1.3  IFE 1      Comment  Please Note (HCV):      Comment  PTH, Intact     15 - 65 pg/mL 56  Calcium, Total (PTH)     8.7 - 10.3 mg/dL 10.9 (H)  PTH Interp      Comment  Kappa free light chain     3.3 - 19.4 mg/L 23.4 (H)  Lamda free light chains     5.7 - 26.3 mg/L 10.3  Kappa, lamda light chain ratio     0.26 - 1.65 2.27 (H)  Beta-2 Microglobulin     0.6 - 2.4 mg/L 2.9 (H)  Sed Rate     0 - 22 mm/hr 2  LDH     125 - 245 U/L 169    OUTSIDE LABS            With Ferritin of 5.7         RADIOGRAPHIC STUDIES: I have personally reviewed the radiological images as listed and agreed with the findings in the report. No results found.  ASSESSMENT & PLAN:  Molly Lucas is a 74 y.o. Caucasian female with  1. Severe iron deficiency Anemia due to GI bleeding  2. Hiatal Hernia with h/o cameron ulcers with previous recurrent Iron deficiency needing IV iron replacement in the past. -Found on GI workup and likely cause of GI bleeding leading to anemia.   3. Recurrent Anemia - secondary to GI blood loss -in 2014 treated with IV Feraheme, 2017 after GI ulcer and was treated with Blood transfusion, 11/2016 treated with IV Feraheme.   . Lab Results  Component Value Date   IRON 98 01/19/2020   TIBC 360 01/19/2020   IRONPCTSAT 27 01/19/2020   (Iron and TIBC)  Lab Results  Component Value Date   FERRITIN  243 01/19/2020   4. Weight loss - 15 pounds over 2 months -secondary to GI issues  -Improved eating with Prilosec 40mg  once a day  5. H/o Elevated Calcium at 11.7 rpt today improved to 11 PTH - WNL Myeloma panel - no M spike and neg IFE K/L ratio minimally abnormal but only minimal increase in Kappa light chains B2MG  -borderline elevated.  PLAN:  -Discussed pt labwork today, 04/23/20; WBC & PLT are nml, Hgb has held steady, blood chemistries are stable  -Discussed 04/23/2020 Ferritin is 176  & Iron saturation 42% -Due to stability of blood counts and burden of care  Pt prefers  to continue to follow with PCP -If GI bleeding recurs recommend restarting monthly IV Injectafer  -Advised pt to contact Dr. Earlean Shawl if she sees any bloody/black stools -Recommend pt f/u with Dr. Philip Aspen for periodic labs (every 2-3 months) -Will see back as needed   FOLLOW UP: RTC with Dr Irene Limbo as needed F/u with PCP in 2-3 months to monitor labs   The total time spent in the appt was 20 minutes and more than 50% was on counseling and direct patient cares.  All of the patient's questions were answered with apparent satisfaction. The patient knows to call the clinic with any problems, questions or concerns.    Sullivan Lone MD [place] AAHIVMS [facility] Hematology/Oncology Physician [facility]  (Office):       [phone] (Work cell):  [phone] (Fax):           [phone]  04/23/2020 6:23 PM  I, Yevette Edwards, am acting as a scribe for Dr. Sullivan Lone.   .I have reviewed the above documentation for accuracy and completeness, and I agree with the above. Brunetta Genera MD

## 2020-04-24 DIAGNOSIS — F411 Generalized anxiety disorder: Secondary | ICD-10-CM | POA: Diagnosis not present

## 2020-04-24 LAB — FERRITIN: Ferritin: 176 ng/mL (ref 11–307)

## 2020-04-24 LAB — IRON AND TIBC
Iron: 140 ug/dL (ref 41–142)
Saturation Ratios: 42 % (ref 21–57)
TIBC: 330 ug/dL (ref 236–444)
UIBC: 190 ug/dL (ref 120–384)

## 2020-08-16 DIAGNOSIS — D649 Anemia, unspecified: Secondary | ICD-10-CM | POA: Diagnosis not present

## 2020-08-16 DIAGNOSIS — D509 Iron deficiency anemia, unspecified: Secondary | ICD-10-CM | POA: Diagnosis not present

## 2020-10-14 DIAGNOSIS — Z23 Encounter for immunization: Secondary | ICD-10-CM | POA: Diagnosis not present

## 2020-11-20 DIAGNOSIS — F331 Major depressive disorder, recurrent, moderate: Secondary | ICD-10-CM | POA: Diagnosis not present

## 2020-11-27 ENCOUNTER — Other Ambulatory Visit: Payer: Self-pay | Admitting: Obstetrics & Gynecology

## 2020-11-27 DIAGNOSIS — M810 Age-related osteoporosis without current pathological fracture: Secondary | ICD-10-CM

## 2020-12-04 DIAGNOSIS — F331 Major depressive disorder, recurrent, moderate: Secondary | ICD-10-CM | POA: Diagnosis not present

## 2020-12-12 DIAGNOSIS — Z6833 Body mass index (BMI) 33.0-33.9, adult: Secondary | ICD-10-CM | POA: Diagnosis not present

## 2020-12-12 DIAGNOSIS — Z01411 Encounter for gynecological examination (general) (routine) with abnormal findings: Secondary | ICD-10-CM | POA: Diagnosis not present

## 2020-12-12 DIAGNOSIS — M810 Age-related osteoporosis without current pathological fracture: Secondary | ICD-10-CM | POA: Diagnosis not present

## 2020-12-12 DIAGNOSIS — Z1231 Encounter for screening mammogram for malignant neoplasm of breast: Secondary | ICD-10-CM | POA: Diagnosis not present

## 2020-12-12 DIAGNOSIS — Z01419 Encounter for gynecological examination (general) (routine) without abnormal findings: Secondary | ICD-10-CM | POA: Diagnosis not present

## 2020-12-12 DIAGNOSIS — Z124 Encounter for screening for malignant neoplasm of cervix: Secondary | ICD-10-CM | POA: Diagnosis not present

## 2020-12-26 ENCOUNTER — Ambulatory Visit (INDEPENDENT_AMBULATORY_CARE_PROVIDER_SITE_OTHER): Payer: Medicare Other | Admitting: Cardiology

## 2020-12-26 ENCOUNTER — Encounter: Payer: Self-pay | Admitting: Cardiology

## 2020-12-26 ENCOUNTER — Other Ambulatory Visit: Payer: Self-pay

## 2020-12-26 VITALS — BP 134/71 | HR 95 | Ht 62.0 in | Wt 195.6 lb

## 2020-12-26 DIAGNOSIS — R0602 Shortness of breath: Secondary | ICD-10-CM

## 2020-12-26 DIAGNOSIS — I10 Essential (primary) hypertension: Secondary | ICD-10-CM | POA: Diagnosis not present

## 2020-12-26 DIAGNOSIS — D500 Iron deficiency anemia secondary to blood loss (chronic): Secondary | ICD-10-CM | POA: Diagnosis not present

## 2020-12-26 DIAGNOSIS — R011 Cardiac murmur, unspecified: Secondary | ICD-10-CM | POA: Insufficient documentation

## 2020-12-26 DIAGNOSIS — I5189 Other ill-defined heart diseases: Secondary | ICD-10-CM | POA: Diagnosis not present

## 2020-12-26 DIAGNOSIS — D509 Iron deficiency anemia, unspecified: Secondary | ICD-10-CM

## 2020-12-26 DIAGNOSIS — E785 Hyperlipidemia, unspecified: Secondary | ICD-10-CM

## 2020-12-26 MED ORDER — HYDROCHLOROTHIAZIDE 12.5 MG PO CAPS: ORAL_CAPSULE | ORAL | 0 refills | Status: DC

## 2020-12-26 NOTE — Assessment & Plan Note (Signed)
-

## 2020-12-26 NOTE — Assessment & Plan Note (Signed)
Chronic anemia- followed by Dr Irene Limbo and Dr Sharlett Iles

## 2020-12-26 NOTE — Progress Notes (Signed)
Cardiology Office Note:    Date:  12/26/2020   ID:  Molly Lucas, DOB October 14, 1946, MRN [id number]  PCP:  [name], MD  Cardiologist:  No primary care provider on file.  Electrophysiologist:  None   Referring MD: [name], MD   No chief complaint on file. DOE  History of Present Illness:    Molly Lucas is a 75 y.o. female with a hx of hypertension, dyslipidemia, sleep apnea, anxiety depression, and iron deficiency anemia.  She was seen by Dr. Claiborne Billings in 2017.  Echocardiogram then showed normal LV function with grade 2 diastolic dysfunction.  Nuclear stress test was low risk, diaphragmatic abnormality felt to be breast attenuation.  Patient was seen again in 2020 as a routine follow-up.  She presents today with complaints of dyspnea on exertion.  Recently she was visiting her son and his family in [place].  During that visit she says she was very active, more active than she usually is.  She also said that she was consciously forcing fluids because she heard you need to stay "hydrated".  She denies any orthopnea.  She does have some lower extremity edema.  She has chronic anemia and is due to have labs next week by Dr. Sharlett Iles.  She denies any chest pain or chest tightness.  Past Medical History:  Diagnosis Date  . Anemia    Takes iron supplement  . Anxiety   . Arthritis   . Chronic pain of left thumb   . Constipation   . 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 Depression   . Fibroid, uterine   . GERD (gastroesophageal reflux disease)   . History of blood transfusion   . Hx of seasonal allergies   . Hyperlipemia   . Hypertension    hx of off medication now.  . OA (osteoarthritis) of hip    left  . Sleep apnea    dentist made mouthipiece     Past Surgical History:  Procedure Laterality Date  . BIOPSY  03/30/2019   Procedure: BIOPSY;  Surgeon: Thornton Park, MD;  Location: [facility];   Service: Gastroenterology;;  . [place]  08/2015   with abductor pollicis longus tendon transfer and suspensionplasty  . COLONOSCOPY WITH PROPOFOL N/A 03/31/2019   Procedure: COLONOSCOPY WITH PROPOFOL;  Surgeon: Thornton Park, MD;  Location: [facility];  Service: Gastroenterology;  Laterality: N/A;  . DE QUERVAIN'S RELEASE  08/2015  . DILATATION & CURRETTAGE/HYSTEROSCOPY WITH RESECTOCOPE  2007  . ESOPHAGOGASTRODUODENOSCOPY N/A 11/30/2015   Procedure: ESOPHAGOGASTRODUODENOSCOPY (EGD);  Surgeon: Richmond Campbell, MD;  Location: [facility];  Service: Endoscopy;  Laterality: N/A;  . ESOPHAGOGASTRODUODENOSCOPY (EGD) WITH PROPOFOL N/A 03/30/2019   Procedure: ESOPHAGOGASTRODUODENOSCOPY (EGD) WITH PROPOFOL;  Surgeon: Thornton Park, MD;  Location: [facility];  Service: Gastroenterology;  Laterality: N/A;  . HERNIA REPAIR Left 06/2012  . INGUINAL HERNIA REPAIR Right 05/03/2013   Procedure: HERNIA REPAIR INGUINAL ADULT;  Surgeon: Harl Bowie, MD;  Location: [facility];  Service: General;  Laterality: Right;  . INGUINAL HERNIA REPAIR Right 08/23/2013   Procedure: LAPAROSCOPIC INGUINAL HERNIA;  Surgeon: Harl Bowie, MD;  Location: [facility];  Service: General;  Laterality: Right;  . INSERTION OF MESH Right 05/03/2013   Procedure: INSERTION OF MESH;  Surgeon: Harl Bowie, MD;  Location: [facility];  Service: General;  Laterality: Right;  . INSERTION OF MESH Right 08/23/2013   Procedure: INSERTION OF MESH;  Surgeon: Harl Bowie, MD;  Location: [facility];  Service: General;  Laterality: Right;  . METACARPOPHALANGEAL JOINT ARTHRODESIS  08/2015   with local bone graft  . TONSILLECTOMY    . TOTAL HIP ARTHROPLASTY  06/24/2012   Procedure: TOTAL HIP ARTHROPLASTY ANTERIOR APPROACH;  Surgeon: Mcarthur Rossetti, MD;  Location: [facility];  Service: Orthopedics;  Laterality: Left;  Left total hip arthroplasty, anterior approach  . UTERINE FIBROID SURGERY  2007  . WISDOM  TOOTH EXTRACTION      Current Medications: Current Meds  Medication Sig  . CALCIUM PO Take 2 tablets by mouth daily.   . cetirizine (ZYRTEC) 10 MG tablet Take 10 mg by mouth daily.  . Cholecalciferol (VITAMIN D3) 250 MCG (10000 UT) TABS Take 10,000 Units by mouth daily.   . Cinnamon 500 MG capsule Take 1,000 mg by mouth daily.   . CYANOCOBALAMIN PO Take by mouth.  . Doxepin HCl 6 MG TABS Take 6 mg by mouth at bedtime.   . DULoxetine (CYMBALTA) 60 MG capsule Take 60 mg by mouth at bedtime.  . fluticasone (FLONASE) 50 MCG/ACT nasal spray Place 2 sprays into both nostrils daily.  . hydrochlorothiazide (MICROZIDE) 12.5 MG capsule Take 2 tablets today and one tablet Friday and one on Saturday  . Multiple Vitamins-Minerals (HAIR SKIN AND NAILS FORMULA PO) Take 1 tablet by mouth daily.  . multivitamin-lutein (OCUVITE-LUTEIN) CAPS Take 1 capsule by mouth daily.  . NON FORMULARY Take 1 capsule by mouth See admin instructions. Black [place] oil capsules: Take 1 capsule by mouth once a day  . Omega-3 1000 MG CAPS Take 2,000 mg by mouth daily.   . pravastatin (PRAVACHOL) 20 MG tablet Take 20 mg by mouth at bedtime.   . Turmeric 500 MG CAPS Take 500 mg by mouth daily.   . Vilazodone HCl (VIIBRYD) 10 MG TABS Take 40 mg by mouth daily. Take with food  . vitamin C (ASCORBIC ACID) 500 MG tablet Take 500 mg by mouth daily.     Allergies:   Penicillins   Social History   Socioeconomic History  . Marital status: Divorced    Spouse name: Not on file  . Number of children: Not on file  . Years of education: Not on file  . Highest education level: Not on file  Occupational History  . Not on file  Tobacco Use  . Smoking status: Never Smoker  . Smokeless tobacco: Never Used  Vaping Use  . Vaping Use: Never used  Substance and Sexual Activity  . Alcohol use: Yes    Alcohol/week: 2.0 standard drinks    Types: 2 Glasses of wine per week    Comment: occasional glass of wine   . Drug use: No  .  Sexual activity: Not on file  Other Topics Concern  . Not on file  Social History Narrative  . Not on file   Social Determinants of Health   Financial Resource Strain: Not on file  Food Insecurity: Not on file  Transportation Needs: Not on file  Physical Activity: Not on file  Stress: Not on file  Social Connections: Not on file     Family History: The patient's family history includes Arthritis in her mother and sister; CAD in her maternal grandfather, paternal grandfather, and paternal grandmother; Dementia in her maternal grandfather; Heart failure in her maternal grandfather and mother; Osteoporosis in her mother.  ROS:   Please see the history of present illness.     All other  systems reviewed and are negative.  EKGs/Labs/Other Studies Reviewed:    The following studies were reviewed today: Echo 11/30/2015- Study Conclusions   - Left ventricle: The cavity size was normal. Systolic function was  normal. The estimated ejection fraction was in the range of 55%  to 60%. Wall motion was normal; there were no regional wall  motion abnormalities. Features are consistent with a pseudonormal  left ventricular filling pattern, with concomitant abnormal  relaxation and increased filling pressure (grade 2 diastolic  dysfunction).  - Aortic valve: Valve area (Vmax): 2.32 cm^2.  - Aortic root: The aortic root was mildly dilated.  - Mitral valve: There was mild to moderate regurgitation.  - Left atrium: The atrium was mildly dilated.  - Pericardium, extracardiac: A trivial pericardial effusion was  identified.   Myoview 03/18/2016-  The left ventricular ejection fraction is normal (55-65%).  Nuclear stress EF: 65%.  There was no ST segment deviation noted during stress.  Defect 1: There is a medium defect of moderate severity present in the basal inferior, basal inferolateral, mid inferior and mid inferolateral location. Consistent with prior infarct.  This is a  low risk study.  No ischemia is identified.     EKG:  EKG is ordered today.  The ekg ordered today demonstrates NSR-95  Recent Labs: 04/23/2020: ALT 31; BUN 16; Creatinine 0.86; Hemoglobin 14.4; Platelet Count 177; Potassium 4.4; Sodium 143  Recent Lipid Panel    Component Value Date/Time   CHOL 178 04/02/2016 0945   TRIG 84 04/02/2016 0945   HDL 89 04/02/2016 0945   CHOLHDL 2.0 04/02/2016 0945   VLDL 17 04/02/2016 0945   LDLCALC 72 04/02/2016 0945    Physical Exam:    VS:  BP 134/71   Pulse 95   Ht 5\' 2"  (1.575 m)   Wt 195 lb 9.6 oz (88.7 kg)   SpO2 100%   BMI 35.78 kg/m     Wt Readings from Last 3 Encounters:  12/26/20 195 lb 9.6 oz (88.7 kg)  04/23/20 196 lb 1.6 oz (89 kg)  03/13/20 180 lb (81.6 kg)     GEN: Overweight Caucasian female,  well developed in no acute distress HEENT: Normal NECK: No JVD; transmitted murmur RCA CARDIAC: RRR, 2/6 systolic murmur AOV, preserved S2, no rubs, gallops RESPIRATORY:  Clear to auscultation without rales, wheezing or rhonchi  ABDOMEN: Soft,  non-distended MUSCULOSKELETAL:  Trace to 1+ LE  edema; No deformity  SKIN: Pale- Warm and dry NEUROLOGIC:  Alert and oriented x 3 PSYCHIATRIC:  Normal affect   ASSESSMENT:    SOB (shortness of breath) on exertion I suspect she is mildly volume overloaded secondary to over hydration and diastolic dysfunction. I will have her try a short course of diuretics.  Labs to be done by PCP will determine anemia status though this has been stable.  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  F/U with Dr Sharlett Iles as scheduled.  Avoid over hydration and sodium. Keep f/u with Dr Claiborne Billings as scheduled.    Medication Adjustments/Labs and Tests  Ordered: Current medicines are reviewed at length with the patient today.  Concerns regarding medicines are outlined above.  Orders Placed This Encounter  Procedures  . EKG  12-Lead  . ECHOCARDIOGRAM COMPLETE   Meds ordered this encounter  Medications  . hydrochlorothiazide (MICROZIDE) 12.5 MG capsule    Sig: Take 2 tablets today and one tablet Friday and one on Saturday    Dispense:  4 capsule    Refill:  0    Patient Instructions  Medication Instructions:  TAKE- Hydrochlorothiazide 12.5 mg, take 2 tablet today then one table on Friday and one on Saturday  *If you need a refill on your cardiac medications before your next appointment, please call your pharmacy*   Lab Work: None Ordered  Testing/Procedures: Your physician has requested that you have an echocardiogram. Echocardiography is a painless test that uses sound waves to create images of your heart. It provides your doctor with information about the size and shape of your heart and how well your heart's chambers and valves are working. This procedure takes approximately one hour. There are no restrictions for this procedure.  Follow-Up: At [facility], you and your health needs are our priority.  As part of our continuing mission to provide you with exceptional heart care, we have created designated Provider Care Teams.  These Care Teams include your primary Cardiologist (physician) and Advanced Practice Providers (APPs -  Physician Assistants and Nurse Practitioners) who all work together to provide you with the care you need, when you need it.  We recommend signing up for the patient portal called "MyChart".  Sign up information is provided on this After Visit Summary.  MyChart is used to connect with patients for Virtual Visits (Telemedicine).  Patients are able to view lab/test results, encounter notes, upcoming appointments, etc.  Non-urgent messages can be sent to your provider as well.   To learn more about what you  can do with MyChart, go to [email or web address].    Your next appointment:   Keep follow up appointment on May 6th @ 4:00 pm  The format for your next appointment:   In Person  Provider:   Shelva Majestic, MD       Signed, Kerin Ransom, PA-C  12/26/2020 10:35 AM    [place]

## 2020-12-26 NOTE — Assessment & Plan Note (Signed)
I suspect she is mildly volume overloaded secondary to over hydration and diastolic dysfunction. I will have her try a short course of diuretics.  Labs to be done by PCP will determine anemia status though this has been stable.

## 2020-12-26 NOTE — Assessment & Plan Note (Signed)
Suspect AOV sclerosis - check echo for murmur, DD, and LVF

## 2020-12-26 NOTE — Patient Instructions (Signed)
Medication Instructions:  TAKE- Hydrochlorothiazide 12.5 mg, take 2 tablet today then one table on Friday and one on Saturday  *If you need a refill on your cardiac medications before your next appointment, please call your pharmacy*   Lab Work: None Ordered  Testing/Procedures: Your physician has requested that you have an echocardiogram. Echocardiography is a painless test that uses sound waves to create images of your heart. It provides your doctor with information about the size and shape of your heart and how well your heart's chambers and valves are working. This procedure takes approximately one hour. There are no restrictions for this procedure.  Follow-Up: At [facility], you and your health needs are our priority.  As part of our continuing mission to provide you with exceptional heart care, we have created designated Provider Care Teams.  These Care Teams include your primary Cardiologist (physician) and Advanced Practice Providers (APPs -  Physician Assistants and Nurse Practitioners) who all work together to provide you with the care you need, when you need it.  We recommend signing up for the patient portal called "MyChart".  Sign up information is provided on this After Visit Summary.  MyChart is used to connect with patients for Virtual Visits (Telemedicine).  Patients are able to view lab/test results, encounter notes, upcoming appointments, etc.  Non-urgent messages can be sent to your provider as well.   To learn more about what you can do with MyChart, go to [email or web address].    Your next appointment:   Keep follow up appointment on May 6th @ 4:00 pm  The format for your next appointment:   In Person  Provider:   Shelva Majestic, MD

## 2020-12-26 NOTE — Assessment & Plan Note (Signed)
Grade 2 on echo 2017

## 2020-12-27 DIAGNOSIS — E785 Hyperlipidemia, unspecified: Secondary | ICD-10-CM | POA: Diagnosis not present

## 2020-12-27 DIAGNOSIS — F331 Major depressive disorder, recurrent, moderate: Secondary | ICD-10-CM | POA: Diagnosis not present

## 2020-12-30 ENCOUNTER — Inpatient Hospital Stay (HOSPITAL_COMMUNITY)
Admission: EM | Admit: 2020-12-30 | Discharge: 2021-01-01 | DRG: 378 | Disposition: A | Discharge status: Home Health | Payer: Medicare Other | Attending: Internal Medicine | Admitting: Internal Medicine

## 2020-12-30 ENCOUNTER — Encounter (HOSPITAL_COMMUNITY): Payer: Self-pay

## 2020-12-30 ENCOUNTER — Other Ambulatory Visit: Payer: Self-pay

## 2020-12-30 ENCOUNTER — Telehealth: Payer: Self-pay | Admitting: Cardiovascular Disease

## 2020-12-30 DIAGNOSIS — K921 Melena: Secondary | ICD-10-CM | POA: Diagnosis present

## 2020-12-30 DIAGNOSIS — D509 Iron deficiency anemia, unspecified: Secondary | ICD-10-CM | POA: Diagnosis present

## 2020-12-30 DIAGNOSIS — E785 Hyperlipidemia, unspecified: Secondary | ICD-10-CM | POA: Diagnosis not present

## 2020-12-30 DIAGNOSIS — M1612 Unilateral primary osteoarthritis, left hip: Secondary | ICD-10-CM | POA: Diagnosis present

## 2020-12-30 DIAGNOSIS — K449 Diaphragmatic hernia without obstruction or gangrene: Secondary | ICD-10-CM | POA: Diagnosis not present

## 2020-12-30 DIAGNOSIS — Z79899 Other long term (current) drug therapy: Secondary | ICD-10-CM

## 2020-12-30 DIAGNOSIS — Z96642 Presence of left artificial hip joint: Secondary | ICD-10-CM | POA: Diagnosis present

## 2020-12-30 DIAGNOSIS — K254 Chronic or unspecified gastric ulcer with hemorrhage: Principal | ICD-10-CM | POA: Diagnosis present

## 2020-12-30 DIAGNOSIS — D500 Iron deficiency anemia secondary to blood loss (chronic): Secondary | ICD-10-CM | POA: Diagnosis not present

## 2020-12-30 DIAGNOSIS — F32A Depression, unspecified: Secondary | ICD-10-CM | POA: Diagnosis present

## 2020-12-30 DIAGNOSIS — K257 Chronic gastric ulcer without hemorrhage or perforation: Secondary | ICD-10-CM

## 2020-12-30 DIAGNOSIS — Z8711 Personal history of peptic ulcer disease: Secondary | ICD-10-CM

## 2020-12-30 DIAGNOSIS — D62 Acute posthemorrhagic anemia: Secondary | ICD-10-CM | POA: Diagnosis present

## 2020-12-30 DIAGNOSIS — D649 Anemia, unspecified: Secondary | ICD-10-CM | POA: Diagnosis present

## 2020-12-30 DIAGNOSIS — Z8249 Family history of ischemic heart disease and other diseases of the circulatory system: Secondary | ICD-10-CM | POA: Diagnosis not present

## 2020-12-30 DIAGNOSIS — I10 Essential (primary) hypertension: Secondary | ICD-10-CM | POA: Diagnosis not present

## 2020-12-30 DIAGNOSIS — Z880 Allergy status to penicillin: Secondary | ICD-10-CM | POA: Diagnosis not present

## 2020-12-30 DIAGNOSIS — M79645 Pain in left finger(s): Secondary | ICD-10-CM | POA: Diagnosis present

## 2020-12-30 DIAGNOSIS — F331 Major depressive disorder, recurrent, moderate: Secondary | ICD-10-CM | POA: Diagnosis not present

## 2020-12-30 DIAGNOSIS — R195 Other fecal abnormalities: Secondary | ICD-10-CM | POA: Diagnosis not present

## 2020-12-30 DIAGNOSIS — D539 Nutritional anemia, unspecified: Secondary | ICD-10-CM | POA: Diagnosis present

## 2020-12-30 DIAGNOSIS — G8929 Other chronic pain: Secondary | ICD-10-CM | POA: Diagnosis present

## 2020-12-30 DIAGNOSIS — Z20822 Contact with and (suspected) exposure to covid-19: Secondary | ICD-10-CM | POA: Diagnosis present

## 2020-12-30 DIAGNOSIS — G4733 Obstructive sleep apnea (adult) (pediatric): Secondary | ICD-10-CM | POA: Diagnosis not present

## 2020-12-30 DIAGNOSIS — K219 Gastro-esophageal reflux disease without esophagitis: Secondary | ICD-10-CM

## 2020-12-30 DIAGNOSIS — M1990 Unspecified osteoarthritis, unspecified site: Secondary | ICD-10-CM | POA: Diagnosis present

## 2020-12-30 DIAGNOSIS — F419 Anxiety disorder, unspecified: Secondary | ICD-10-CM | POA: Diagnosis present

## 2020-12-30 DIAGNOSIS — K922 Gastrointestinal hemorrhage, unspecified: Secondary | ICD-10-CM | POA: Diagnosis not present

## 2020-12-30 DIAGNOSIS — Z8261 Family history of arthritis: Secondary | ICD-10-CM | POA: Diagnosis not present

## 2020-12-30 LAB — DIFFERENTIAL
Abs Immature Granulocytes: 0.09 10*3/uL — ABNORMAL HIGH (ref 0.00–0.07)
Basophils Absolute: 0 10*3/uL (ref 0.0–0.1)
Basophils Relative: 0 %
Eosinophils Absolute: 0.1 10*3/uL (ref 0.0–0.5)
Eosinophils Relative: 1 %
Immature Granulocytes: 1 %
Lymphocytes Relative: 8 %
Lymphs Abs: 0.8 10*3/uL (ref 0.7–4.0)
Monocytes Absolute: 0.8 10*3/uL (ref 0.1–1.0)
Monocytes Relative: 8 %
Neutro Abs: 9.2 10*3/uL — ABNORMAL HIGH (ref 1.7–7.7)
Neutrophils Relative %: 82 %

## 2020-12-30 LAB — CBC
HCT: 15.6 % — ABNORMAL LOW (ref 36.0–46.0)
Hemoglobin: 4.3 g/dL — CL (ref 12.0–15.0)
MCH: 21.8 pg — ABNORMAL LOW (ref 26.0–34.0)
MCHC: 27.6 g/dL — ABNORMAL LOW (ref 30.0–36.0)
MCV: 79.2 fL — ABNORMAL LOW (ref 80.0–100.0)
Platelets: 269 10*3/uL (ref 150–400)
RBC: 1.97 MIL/uL — ABNORMAL LOW (ref 3.87–5.11)
RDW: 21.3 % — ABNORMAL HIGH (ref 11.5–15.5)
WBC: 11.1 10*3/uL — ABNORMAL HIGH (ref 4.0–10.5)
nRBC: 0.6 % — ABNORMAL HIGH (ref 0.0–0.2)

## 2020-12-30 LAB — COMPREHENSIVE METABOLIC PANEL
ALT: 15 U/L (ref 0–44)
AST: 14 U/L — ABNORMAL LOW (ref 15–41)
Albumin: 3.7 g/dL (ref 3.5–5.0)
Alkaline Phosphatase: 81 U/L (ref 38–126)
Anion gap: 9 (ref 5–15)
BUN: 32 mg/dL — ABNORMAL HIGH (ref 8–23)
CO2: 23 mmol/L (ref 22–32)
Calcium: 9.5 mg/dL (ref 8.9–10.3)
Chloride: 105 mmol/L (ref 98–111)
Creatinine, Ser: 0.78 mg/dL (ref 0.44–1.00)
GFR, Estimated: >60 mL/min (ref >60)
Glucose, Bld: 120 mg/dL — ABNORMAL HIGH (ref 70–99)
Potassium: 3.9 mmol/L (ref 3.5–5.1)
Sodium: 137 mmol/L (ref 135–145)
Total Bilirubin: 1.1 mg/dL (ref 0.3–1.2)
Total Protein: 6.2 g/dL — ABNORMAL LOW (ref 6.5–8.1)

## 2020-12-30 LAB — RETICULOCYTES
Immature Retic Fract: 16.4 % — ABNORMAL HIGH (ref 2.3–15.9)
RBC.: 2 MIL/uL — ABNORMAL LOW (ref 3.87–5.11)
Retic Count, Absolute: 79.2 10*3/uL (ref 19.0–186.0)
Retic Ct Pct: 4 % — ABNORMAL HIGH (ref 0.4–3.1)

## 2020-12-30 LAB — IRON AND TIBC
Iron: 23 ug/dL — ABNORMAL LOW (ref 28–170)
Saturation Ratios: 4 % — ABNORMAL LOW (ref 10.4–31.8)
TIBC: 577 ug/dL — ABNORMAL HIGH (ref 250–450)
UIBC: 554 ug/dL

## 2020-12-30 LAB — VITAMIN B12: Vitamin B-12: 395 pg/mL (ref 180–914)

## 2020-12-30 LAB — POC OCCULT BLOOD, ED: Fecal Occult Bld: POSITIVE — AB

## 2020-12-30 LAB — MRSA PCR SCREENING: MRSA by PCR: NEGATIVE

## 2020-12-30 LAB — FERRITIN: Ferritin: 3 ng/mL — ABNORMAL LOW (ref 11–307)

## 2020-12-30 LAB — FOLATE: Folate: 20.9 ng/mL (ref >5.9)

## 2020-12-30 LAB — PREPARE RBC (CROSSMATCH)

## 2020-12-30 MED ORDER — LORATADINE 10 MG PO TABS
10.0000 mg | ORAL_TABLET | Freq: Every day | ORAL | Status: DC
  Administered 2020-12-30 – 2021-01-01 (×3): 10 mg via ORAL
  Filled 2020-12-30 (×3): qty 1

## 2020-12-30 MED ORDER — DOXEPIN HCL 10 MG/ML PO CONC
6.0000 mg | Freq: Every day | ORAL | Status: DC
  Administered 2020-12-30 – 2020-12-31 (×2): 6 mg via ORAL
  Filled 2020-12-30 (×5): qty 0.6

## 2020-12-30 MED ORDER — SODIUM CHLORIDE 0.9% FLUSH
3.0000 mL | Freq: Two times a day (BID) | INTRAVENOUS | Status: DC
  Administered 2020-12-31 – 2021-01-01 (×3): 3 mL via INTRAVENOUS

## 2020-12-30 MED ORDER — SORBITOL 70 % SOLN
30.0000 mL | Freq: Every day | Status: DC | PRN
  Filled 2020-12-30: qty 30

## 2020-12-30 MED ORDER — ACETAMINOPHEN 325 MG PO TABS: 650.0000 mg | ORAL_TABLET | Freq: Four times a day (QID) | ORAL | Status: DC | PRN

## 2020-12-30 MED ORDER — PRAVASTATIN SODIUM 20 MG PO TABS
20.0000 mg | ORAL_TABLET | Freq: Every day | ORAL | Status: DC
  Administered 2020-12-30 – 2020-12-31 (×2): 20 mg via ORAL
  Filled 2020-12-30 (×2): qty 1

## 2020-12-30 MED ORDER — METOPROLOL TARTRATE 5 MG/5ML IV SOLN: 5.0000 mg | Freq: Four times a day (QID) | INTRAVENOUS | Status: DC | PRN

## 2020-12-30 MED ORDER — VILAZODONE HCL 20 MG PO TABS
40.0000 mg | ORAL_TABLET | Freq: Every day | ORAL | Status: DC
  Administered 2020-12-30 – 2021-01-01 (×3): 40 mg via ORAL
  Filled 2020-12-30 (×3): qty 2

## 2020-12-30 MED ORDER — ASCORBIC ACID 500 MG PO TABS
500.0000 mg | ORAL_TABLET | Freq: Every day | ORAL | Status: DC
  Administered 2020-12-30 – 2021-01-01 (×3): 500 mg via ORAL
  Filled 2020-12-30 (×3): qty 1

## 2020-12-30 MED ORDER — ACETAMINOPHEN 650 MG RE SUPP: 650.0000 mg | Freq: Four times a day (QID) | RECTAL | Status: DC | PRN

## 2020-12-30 MED ORDER — SODIUM CHLORIDE 0.9 % IV SOLN
10.0000 mL/h | Freq: Once | INTRAVENOUS | Status: AC
  Administered 2020-12-30: 10 mL/h via INTRAVENOUS

## 2020-12-30 MED ORDER — ONDANSETRON HCL 4 MG PO TABS: 4.0000 mg | ORAL_TABLET | Freq: Four times a day (QID) | ORAL | Status: DC | PRN

## 2020-12-30 MED ORDER — PANTOPRAZOLE SODIUM 40 MG IV SOLR
40.0000 mg | Freq: Two times a day (BID) | INTRAVENOUS | Status: DC
  Administered 2020-12-30 – 2021-01-01 (×4): 40 mg via INTRAVENOUS
  Filled 2020-12-30 (×4): qty 40

## 2020-12-30 MED ORDER — ONDANSETRON HCL 4 MG/2ML IJ SOLN: 4.0000 mg | Freq: Four times a day (QID) | INTRAMUSCULAR | Status: DC | PRN

## 2020-12-30 MED ORDER — SODIUM CHLORIDE 0.9 % IV SOLN: INTRAVENOUS | Status: DC

## 2020-12-30 MED ORDER — OMEGA-3-ACID ETHYL ESTERS 1 G PO CAPS
2000.0000 mg | ORAL_CAPSULE | Freq: Every day | ORAL | Status: DC
  Administered 2020-12-31 – 2021-01-01 (×2): 2000 mg via ORAL
  Filled 2020-12-30 (×3): qty 2

## 2020-12-30 MED ORDER — CHLORHEXIDINE GLUCONATE CLOTH 2 % EX PADS
6.0000 | MEDICATED_PAD | Freq: Every day | CUTANEOUS | Status: DC
  Administered 2020-12-30 – 2020-12-31 (×2): 6 via TOPICAL

## 2020-12-30 MED ORDER — SENNOSIDES-DOCUSATE SODIUM 8.6-50 MG PO TABS: 1.0000 | ORAL_TABLET | Freq: Every evening | ORAL | Status: DC | PRN

## 2020-12-30 NOTE — ED Provider Notes (Signed)
[facility] DEPT Provider Note   CSN: [id number] Arrival date & time: 12/30/20  1352     History Chief Complaint  Patient presents with  . Blood In [place] is a 75 y.o. female.  HPI Patient presents with generally feeling bad and black stools.  History of recurrent GI bleeds anemia.  Has had transfusions in the past.  Sees Dr. Earlean Shawl primarily for GI.  Has had scopes without a clear cause found but apparently reviewing records also may have had GI bleeding from an ulcer.  Increased fatigue.  States she has had some mild swelling in her legs and saw cardiology recently but they did not do any blood work.  States she was feeling a little off yesterday but today developed black stools.No blood in stool.  States he did throw up yesterday but there was no blood in the emesis.  Has seen Dr. Tarri Glenn in the past, [facility] in request to see them if GI is required.    Past Medical History:  Diagnosis Date  . Anemia    Takes iron supplement  . Anxiety   . Arthritis   . Chronic pain of left thumb   . Constipation   . 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 Depression   . Fibroid, uterine   . GERD (gastroesophageal reflux disease)   . History of blood transfusion   . Hx of seasonal allergies   . Hyperlipemia   . Hypertension    hx of off medication now.  . OA (osteoarthritis) of hip    left  . Sleep apnea    dentist made mouthipiece     Patient Active Problem List   Diagnosis Date Noted  . Essential hypertension 12/26/2020  . Diastolic dysfunction 27/78/2423  . Systolic murmur 53/61/4431  . Red blood cell antibody positive 09/18/2019  . GERD (gastroesophageal reflux disease) 05/19/2019  . Depression 05/19/2019  . History of gastric ulcer 03/29/2019  . Insomnia 03/29/2019  . Chronic gastrointestinal bleeding 03/29/2019  . Excessive daytime sleepiness 06/20/2018  . SOB (shortness of  breath) on exertion 06/20/2018  . Fatigue due to depression 06/20/2018  . OSA (obstructive sleep apnea) 06/20/2018  . Trigger thumb of right hand 04/29/2017  . Iron deficiency anemia 04/09/2016  . HLD (hyperlipidemia) 02/26/2016  . Family history of heart disease 02/26/2016  . Trivial Pericardial effusion 02/26/2016  . Anemia 11/29/2015  . Symptomatic anemia 11/29/2015  . Dyspnea   . Melena   . Occult blood in stools   . Primary osteoarthritis of first carpometacarpal joint of left hand 09/30/2015  . Chronic pain of left thumb 08/01/2015  . Recurrent right inguinal hernia 08/15/2013  . Right inguinal hernia 03/14/2013  . Degenerative arthritis of hip 06/24/2012    Past Surgical History:  Procedure Laterality Date  . BIOPSY  03/30/2019   Procedure: BIOPSY;  Surgeon: Thornton Park, MD;  Location: [facility];  Service: Gastroenterology;;  . [place]  08/2015   with abductor pollicis longus tendon transfer and suspensionplasty  . COLONOSCOPY WITH PROPOFOL N/A 03/31/2019   Procedure: COLONOSCOPY WITH PROPOFOL;  Surgeon: Thornton Park, MD;  Location: [facility];  Service: Gastroenterology;  Laterality: N/A;  . DE QUERVAIN'S RELEASE  08/2015  . DILATATION & CURRETTAGE/HYSTEROSCOPY WITH RESECTOCOPE  2007  . ESOPHAGOGASTRODUODENOSCOPY N/A 11/30/2015   Procedure: ESOPHAGOGASTRODUODENOSCOPY (EGD);  Surgeon: Richmond Campbell, MD;  Location: [facility];  Service:  Endoscopy;  Laterality: N/A;  . ESOPHAGOGASTRODUODENOSCOPY (EGD) WITH PROPOFOL N/A 03/30/2019   Procedure: ESOPHAGOGASTRODUODENOSCOPY (EGD) WITH PROPOFOL;  Surgeon: Thornton Park, MD;  Location: [facility];  Service: Gastroenterology;  Laterality: N/A;  . HERNIA REPAIR Left 06/2012  . INGUINAL HERNIA REPAIR Right 05/03/2013   Procedure: HERNIA REPAIR INGUINAL ADULT;  Surgeon: Harl Bowie, MD;  Location: [facility];  Service: General;  Laterality: Right;  . INGUINAL HERNIA REPAIR Right 08/23/2013    Procedure: LAPAROSCOPIC INGUINAL HERNIA;  Surgeon: Harl Bowie, MD;  Location: [facility];  Service: General;  Laterality: Right;  . INSERTION OF MESH Right 05/03/2013   Procedure: INSERTION OF MESH;  Surgeon: Harl Bowie, MD;  Location: [facility];  Service: General;  Laterality: Right;  . INSERTION OF MESH Right 08/23/2013   Procedure: INSERTION OF MESH;  Surgeon: Harl Bowie, MD;  Location: [facility];  Service: General;  Laterality: Right;  . METACARPOPHALANGEAL JOINT ARTHRODESIS  08/2015   with local bone graft  . TONSILLECTOMY    . TOTAL HIP ARTHROPLASTY  06/24/2012   Procedure: TOTAL HIP ARTHROPLASTY ANTERIOR APPROACH;  Surgeon: Mcarthur Rossetti, MD;  Location: [facility];  Service: Orthopedics;  Laterality: Left;  Left total hip arthroplasty, anterior approach  . UTERINE FIBROID SURGERY  2007  . WISDOM TOOTH EXTRACTION       OB History   No obstetric history on file.     Family History  Problem Relation Age of Onset  . Heart failure Mother   . Osteoporosis Mother   . Arthritis Mother   . CAD Maternal Grandfather   . Heart failure Maternal Grandfather   . Dementia Maternal Grandfather   . CAD Paternal Grandmother   . CAD Paternal Grandfather   . Arthritis Sister     Social History   Tobacco Use  . Smoking status: Never Smoker  . Smokeless tobacco: Never Used  Vaping Use  . Vaping Use: Never used  Substance Use Topics  . Alcohol use: Yes    Alcohol/week: 2.0 standard drinks    Types: 2 Glasses of wine per week    Comment: occasional glass of wine   . Drug use: No    Home Medications Prior to Admission medications   Medication Sig Start Date End Date Taking? Authorizing Provider  CALCIUM PO Take 2 tablets by mouth daily.     [provider]  cetirizine (ZYRTEC) 10 MG tablet Take 10 mg by mouth daily.    [provider]  Cholecalciferol (VITAMIN D3) 250 MCG (10000 UT) TABS Take 10,000 Units by mouth daily.     [provider]   Cinnamon 500 MG capsule Take 1,000 mg by mouth daily.     [provider]  CYANOCOBALAMIN PO Take by mouth.    [provider]  Doxepin HCl 6 MG TABS Take 6 mg by mouth at bedtime.     [provider]  DULoxetine (CYMBALTA) 60 MG capsule Take 60 mg by mouth at bedtime. 03/12/19   [provider]  fluticasone (FLONASE) 50 MCG/ACT nasal spray Place 2 sprays into both nostrils daily.    [provider]  hydrochlorothiazide (MICROZIDE) 12.5 MG capsule Take 2 tablets today and one tablet Friday and one on Saturday 12/26/20   Erlene Quan, PA-C  Multiple Vitamins-Minerals (HAIR SKIN AND NAILS FORMULA PO) Take 1 tablet by mouth daily.    [provider]  multivitamin-lutein (OCUVITE-LUTEIN) CAPS Take 1 capsule by mouth daily.  [provider]  NON FORMULARY Take 1 capsule by mouth See admin instructions. Black [place] oil capsules: Take 1 capsule by mouth once a day    [provider]  Omega-3 1000 MG CAPS Take 2,000 mg by mouth daily.     [provider]  pravastatin (PRAVACHOL) 20 MG tablet Take 20 mg by mouth at bedtime.     [provider]  Turmeric 500 MG CAPS Take 500 mg by mouth daily.     [provider]  Vilazodone HCl (VIIBRYD) 10 MG TABS Take 40 mg by mouth daily. Take with food    [provider]  vitamin C (ASCORBIC ACID) 500 MG tablet Take 500 mg by mouth daily.    [provider]    Allergies    Penicillins  Review of Systems   Review of Systems  Constitutional: Positive for fatigue. Negative for appetite change.  HENT: Negative for congestion.   Respiratory: Positive for shortness of breath.   Cardiovascular: Positive for leg swelling.  Gastrointestinal: Positive for vomiting.       Black stool  Genitourinary: Negative for flank pain.  Musculoskeletal: Negative for back pain.  Skin: Positive for pallor.  Neurological: Negative for weakness.   Psychiatric/Behavioral: Negative for confusion.    Physical Exam Updated Vital Signs BP 121/67   Pulse 82   Temp 99.4 F (37.4 C) (Oral)   Resp (!) 21   Ht 5\' 2"  (1.575 m)   Wt 79.4 kg   SpO2 93%   BMI 32.01 kg/m   Physical Exam Vitals and nursing note reviewed.  HENT:     Head: Normocephalic.     Mouth/Throat:     Mouth: Mucous membranes are moist.  Eyes:     Pupils: Pupils are equal, round, and reactive to light.  Cardiovascular:     Rate and Rhythm: Regular rhythm.     Heart sounds: Murmur heard.      Comments: Systolic murmur Pulmonary:     Breath sounds: No wheezing or rhonchi.  Abdominal:     Tenderness: There is no abdominal tenderness.  Genitourinary:    Rectum: Guaiac result positive.     Comments: Black stool.  No gross blood.  Guaiac positive. Musculoskeletal:     Cervical back: Neck supple.  Skin:    General: Skin is warm.     Capillary Refill: Capillary refill takes less than 2 seconds.  Neurological:     Mental Status: She is alert and oriented to person, place, and time.     ED Results / Procedures / Treatments   Labs (all labs ordered are listed, but only abnormal results are displayed) Labs Reviewed  COMPREHENSIVE METABOLIC PANEL - Abnormal; Notable for the following components:      Result Value   Glucose, Bld 120 (*)    BUN 32 (*)    Total Protein 6.2 (*)    AST 14 (*)    All other components within normal limits  CBC - Abnormal; Notable for the following components:   WBC 11.1 (*)    RBC 1.97 (*)    Hemoglobin 4.3 (*)    HCT 15.6 (*)    MCV 79.2 (*)    MCH 21.8 (*)    MCHC 27.6 (*)    RDW 21.3 (*)    nRBC 0.6 (*)    All other components within normal limits  RETICULOCYTES - Abnormal; Notable for the following components:   Retic Ct Pct 4.0 (*)  RBC. 2.00 (*)    Immature Retic Fract 16.4 (*)    All other components within normal limits  POC OCCULT BLOOD, ED - Abnormal; Notable for the following components:   Fecal Occult  Bld POSITIVE (*)    All other components within normal limits  VITAMIN B12  FOLATE  IRON AND TIBC  FERRITIN  DIFFERENTIAL  TYPE AND SCREEN    EKG None  Radiology No results found.  Procedures Procedures   Medications Ordered in ED Medications  pantoprazole (PROTONIX) injection 40 mg (has no administration in time range)    ED Course  I have reviewed the triage vital signs and the nursing notes.  Pertinent labs & imaging results that were available during my care of the patient were reviewed by me and considered in my medical decision making (see chart for details).    MDM Rules/Calculators/A&P                          Patient presents with anemia and GI bleed.  History of same.  Overall well-appearing considering hemoglobin is 4.  I think likely this is some acute bleeding but probably on a chronic bleed since vitals are reassuring.  Has seen [facility] before and request to see them inpatient.  Discussed with Nicoletta Ba and they will see patient. With a hemoglobin of 4 and symptomatic will transfuse 2 units now.  Anemia panel also sent since she has had a history of iron deficiency anemia but also with somewhat recently been macrocytic also.  Mildly microcytic at this time.  After discussion with GI will start Protonix 40 mg twice a day.  Admit to hospitalist.  CRITICAL CARE Performed by: Davonna Belling Total critical care time: 30 minutes Critical care time was exclusive of separately billable procedures and treating other patients. Critical care was necessary to treat or prevent imminent or life-threatening deterioration. Critical care was time spent personally by me on the following activities: development of treatment plan with patient and/or surrogate as well as nursing, discussions with consultants, evaluation of patient's response to treatment, examination of patient, obtaining history from patient or surrogate, ordering and performing treatments and  interventions, ordering and review of laboratory studies, ordering and review of radiographic studies, pulse oximetry and re-evaluation of patient's condition.  Final Clinical Impression(s) / ED Diagnoses Final diagnoses:  Upper GI bleed    Rx / DC Orders ED Discharge Orders    None       Davonna Belling, MD 12/30/20 1649

## 2020-12-30 NOTE — H&P (Signed)
History and Physical    Molly Lucas [facility] HAL:[id number] DOB: 02-Aug-1946 DOA: 12/30/2020  PCP: [name], MD  Primary gastroenterologist: Dr. Earlean Shawl Patient coming from: [place]  I have personally briefly reviewed patient's old medical records in [place]  Chief Complaint: Black stools  HPI: Molly Lucas is a 75 y.o. female with medical history significant of recurrent GI bleed/iron deficiency anemia, ( initially in 2014 where she was treated with IV Feraheme, another episode of anemia in 11/2015 where she had an ulcer with GI bleeding with hemoglobin going as low as 4 and received a blood transfusion, 10/2017 hemoglobin decreased again and received IV Feraheme.  Patient noted to have had a colonoscopy done 03/31/2019 and the upper endoscopy done 03/30/2019 which were unremarkable with no source of bleeding noted.  Patient states was told in the past may have had some AVMs.  Patient also with history of anxiety, chronic pain of the left thumb, depression, gastroesophageal reflux disease.  Patient presented to the ED with sudden onset of large melanotic stool on the day of admission.  Patient endorses bout of nausea and nonbloody emesis on the day of admission, some transient shortness of breath which she attributed to traveling, some dizziness and lightheadedness, some lower extremity edema.  Patient denies any fevers, no chills, no chest pain, no abdominal pain, no dysuria, no constipation, no diarrhea, no syncopal episodes, no hematochezia, no hematemesis, no other associated symptoms.  Patient denies any daily use of NSAIDs however stated approximately a week ago may have used one-time dose of Aleve.  Patient stated she called her doctor's office and was told to present to the ED. Patient states has been vaccinated for COVID-19 x2 as well as received a booster.  ED Course: Patient seen in the ED, comprehensive metabolic profile done with a BUN of 32 protein of 6.2 otherwise  unremarkable.  CBC done with a white count of 11.1, hemoglobin of 4.3, MCV of 79.2, platelet count of 269.  Anemia panel ordered with a iron level of 23, ferritin of 3, folate of 20.9, vitamin B12 of 395, TIBC of 577.  Per ED physician FOBT was positive and rectal exam with melanotic stool noted.  Vitals noted to be stable.  IV Protonix ordered.  ED physician spoke with GI who recommended hospitalist admission and patient will be consulted on in the morning.  COVID-19 PCR pending.  Review of Systems: As per HPI otherwise all other systems reviewed and are negative  Past Medical History:  Diagnosis Date  . Anemia    Takes iron supplement  . Anxiety   . Arthritis   . Chronic pain of left thumb   . Constipation   . 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 Depression   . Fibroid, uterine   . GERD (gastroesophageal reflux disease)   . History of blood transfusion   . Hx of seasonal allergies   . Hyperlipemia   . Hypertension    hx of off medication now.  . OA (osteoarthritis) of hip    left  . Sleep apnea    dentist made mouthipiece     Past Surgical History:  Procedure Laterality Date  . BIOPSY  03/30/2019   Procedure: BIOPSY;  Surgeon: Thornton Park, MD;  Location: [facility];  Service: Gastroenterology;;  . [place]  08/2015   with abductor pollicis longus tendon transfer and suspensionplasty  . COLONOSCOPY WITH PROPOFOL N/A 03/31/2019  Procedure: COLONOSCOPY WITH PROPOFOL;  Surgeon: Thornton Park, MD;  Location: [facility];  Service: Gastroenterology;  Laterality: N/A;  . DE QUERVAIN'S RELEASE  08/2015  . DILATATION & CURRETTAGE/HYSTEROSCOPY WITH RESECTOCOPE  2007  . ESOPHAGOGASTRODUODENOSCOPY N/A 11/30/2015   Procedure: ESOPHAGOGASTRODUODENOSCOPY (EGD);  Surgeon: Richmond Campbell, MD;  Location: [facility];  Service: Endoscopy;  Laterality: N/A;  . ESOPHAGOGASTRODUODENOSCOPY (EGD) WITH PROPOFOL N/A  03/30/2019   Procedure: ESOPHAGOGASTRODUODENOSCOPY (EGD) WITH PROPOFOL;  Surgeon: Thornton Park, MD;  Location: [facility];  Service: Gastroenterology;  Laterality: N/A;  . HERNIA REPAIR Left 06/2012  . INGUINAL HERNIA REPAIR Right 05/03/2013   Procedure: HERNIA REPAIR INGUINAL ADULT;  Surgeon: Harl Bowie, MD;  Location: [facility];  Service: General;  Laterality: Right;  . INGUINAL HERNIA REPAIR Right 08/23/2013   Procedure: LAPAROSCOPIC INGUINAL HERNIA;  Surgeon: Harl Bowie, MD;  Location: [facility];  Service: General;  Laterality: Right;  . INSERTION OF MESH Right 05/03/2013   Procedure: INSERTION OF MESH;  Surgeon: Harl Bowie, MD;  Location: [facility];  Service: General;  Laterality: Right;  . INSERTION OF MESH Right 08/23/2013   Procedure: INSERTION OF MESH;  Surgeon: Harl Bowie, MD;  Location: [facility];  Service: General;  Laterality: Right;  . METACARPOPHALANGEAL JOINT ARTHRODESIS  08/2015   with local bone graft  . TONSILLECTOMY    . TOTAL HIP ARTHROPLASTY  06/24/2012   Procedure: TOTAL HIP ARTHROPLASTY ANTERIOR APPROACH;  Surgeon: Mcarthur Rossetti, MD;  Location: [facility];  Service: Orthopedics;  Laterality: Left;  Left total hip arthroplasty, anterior approach  . UTERINE FIBROID SURGERY  2007  . WISDOM TOOTH EXTRACTION      Social History  reports that she has never smoked. She has never used smokeless tobacco. She reports current alcohol use of about 2.0 standard drinks of alcohol per week. She reports that she does not use drugs.  Allergies  Allergen Reactions  . Penicillins Swelling and Other (See Comments)    Tongue became swollen and turned black Did it involve swelling of the face/tongue/throat, SOB, or low BP? Yes Did it involve sudden or severe rash/hives, skin peeling, or any reaction on the inside of your mouth or nose? Yes Did you need to seek medical attention at a hospital or doctor's office? Yes When did it last happen? "I was a child" If  all above answers are "NO", may proceed with cephalosporin use.     Family History  Problem Relation Age of Onset  . Heart failure Mother   . Osteoporosis Mother   . Arthritis Mother   . CAD Maternal Grandfather   . Heart failure Maternal Grandfather   . Dementia Maternal Grandfather   . CAD Paternal Grandmother   . CAD Paternal Grandfather   . Arthritis Sister    Father deceased age 71 from motor vehicle accident.  Father had history of coronary artery disease.  Mother deceased in her 42s, cause of death unknown per patient.  Prior to Admission medications   Medication Sig Start Date End Date Taking? Authorizing Provider  CALCIUM PO Take 2 tablets by mouth daily.    Yes [provider]  cetirizine (ZYRTEC) 10 MG tablet Take 10 mg by mouth daily.   Yes [provider]  Cholecalciferol (VITAMIN D3) 250 MCG (10000 UT) TABS Take 10,000 Units by mouth daily.    Yes [provider]  CYANOCOBALAMIN PO Take 1 tablet by mouth daily.   Yes [provider]  Doxepin HCl 6 MG  TABS Take 6 mg by mouth at bedtime.    Yes [provider]  Multiple Vitamins-Minerals (HAIR SKIN AND NAILS FORMULA PO) Take 1 tablet by mouth daily.   Yes [provider]  multivitamin-lutein (OCUVITE-LUTEIN) CAPS Take 1 capsule by mouth daily.   Yes [provider]  naproxen sodium (ALEVE) 220 MG tablet Take 220 mg by mouth 2 (two) times daily as needed (headache/pain).   Yes [provider]  NON FORMULARY Take 1 capsule by mouth See admin instructions. [place] oil capsules: Take 1 capsule by mouth once a day   Yes [provider]  Omega-3 1000 MG CAPS Take 2,000 mg by mouth daily.    Yes [provider]  pravastatin (PRAVACHOL) 20 MG tablet Take 20 mg by mouth at bedtime.    Yes [provider]  Turmeric 500 MG CAPS Take 500 mg by mouth daily. Tumeric with cucumin   Yes [provider]  VIIBRYD 40 MG TABS Take  40 mg by mouth daily. 12/24/20  Yes [provider]  vitamin C (ASCORBIC ACID) 500 MG tablet Take 500 mg by mouth daily.   Yes [provider]  hydrochlorothiazide (MICROZIDE) 12.5 MG capsule Take 2 tablets today and one tablet Friday and one on Saturday 12/26/20   Erlene Quan, [place]    Physical Exam: Vitals:   12/30/20 1400 12/30/20 1401 12/30/20 1620  BP: 112/64  121/67  Pulse: 92  82  Resp: 18  (!) 21  Temp: 99.4 F (37.4 C)    TempSrc: Oral    SpO2: 100%  93%  Weight:  79.4 kg   Height:  5\' 2"  (1.575 m)     Constitutional: NAD, calm, comfortable.  Pallor Vitals:   12/30/20 1400 12/30/20 1401 12/30/20 1620  BP: 112/64  121/67  Pulse: 92  82  Resp: 18  (!) 21  Temp: 99.4 F (37.4 C)    TempSrc: Oral    SpO2: 100%  93%  Weight:  79.4 kg   Height:  5\' 2"  (1.575 m)    Eyes: PERRL, lids and conjunctivae normal ENMT: Mucous membranes are moist. Posterior pharynx clear of any exudate or lesions.Normal dentition.  Neck: normal, supple, no masses, no thyromegaly Respiratory: clear to auscultation bilaterally, no wheezing, no crackles. Normal respiratory effort. No accessory muscle use.  Cardiovascular: Regular rate and rhythm, no murmurs / rubs / gallops. No extremity edema. 2+ pedal pulses. No carotid bruits.  Abdomen: no tenderness, no masses palpated. No hepatosplenomegaly. Bowel sounds positive.  Musculoskeletal: no clubbing / cyanosis.  Ulnar deviation noted on bilateral hands.  Normal muscular tone.  Skin: no rashes, lesions, ulcers. No induration Neurologic: CN 2-12 grossly intact. Sensation intact, DTR normal. Strength 5/5 in all 4.  Psychiatric: Normal judgment and insight. Alert and oriented x 3. Normal mood.   Labs on Admission: I have personally reviewed following labs and imaging studies  CBC: Recent Labs  Lab 12/30/20 1419  WBC 11.1*  HGB 4.3*  HCT 15.6*  MCV 79.2*  PLT 644    Basic Metabolic Panel: Recent Labs  Lab 12/30/20 1419   NA 137  K 3.9  CL 105  CO2 23  GLUCOSE 120*  BUN 32*  CREATININE 0.78  CALCIUM 9.5    GFR: Estimated Creatinine Clearance: 58.4 mL/min (by C-G formula based on SCr of 0.78 mg/dL).  Liver Function Tests: Recent Labs  Lab 12/30/20 1419  AST 14*  ALT 15  ALKPHOS 81  BILITOT 1.1  PROT 6.2*  ALBUMIN 3.7    Urine analysis:    Component Value Date/Time   COLORURINE YELLOW 06/16/2012 1025   APPEARANCEUR CLOUDY (A) 06/16/2012 1025   LABSPEC 1.025 06/16/2012 1025   PHURINE 5.5 06/16/2012 1025   GLUCOSEU NEGATIVE 06/16/2012 1025   HGBUR NEGATIVE 06/16/2012 1025   [place] 06/16/2012 1025   KETONESUR NEGATIVE 06/16/2012 1025   PROTEINUR NEGATIVE 06/16/2012 1025   UROBILINOGEN 0.2 06/16/2012 1025   NITRITE NEGATIVE 06/16/2012 1025   LEUKOCYTESUR MODERATE (A) 06/16/2012 1025    Radiological Exams on Admission: No results found.  EKG: Not done  Assessment/Plan Principal Problem:   Symptomatic anemia Active Problems:   Melena   HLD (hyperlipidemia)   Iron deficiency anemia   OSA (obstructive sleep apnea)   History of gastric ulcer   GERD (gastroesophageal reflux disease)   Depression   Essential hypertension   1 symptomatic iron deficiency anemia/melanotic stools Patient presented with symptomatic iron deficiency anemia.  Patient noted to have melanotic stools noted this morning.  Patient with some associated dizziness.  Patient does endorse some intermittent shortness of breath which she attributed to traveling.  Hemoglobin on presentation noted at 4.3.  Last hemoglobin noted in epic was 14.4(04/23/2020) patient noted to have had extensive work-up before in the past per patient with recent upper endoscopy and colonoscopy done 03/2019 with no source of anemia or bleeding noted at that time.  Patient also in the past has been on IV iron infusions that she states has been obtaining monthly.  Anemia panel obtained in the ED 3, ferritin of 3, folate of 20.9,  vitamin B12 of 395, TIBC of 577.  2 units of packed red blood cells ordered to be transfused and pending.  Gentle hydration.  Will likely benefit from IV Feraheme during this hospitalization and prior to discharge. GI consulted recommended IV PPI, clear liquids overnight and n.p.o. early in the morning with probable upper endoscopy and further evaluation.  2.  Acute blood loss anemia Secondary to problem #1.  See #1.  3.  OSA We will place on CPAP nightly.  4.  Gastroesophageal reflux disease PPI.  5.  Hypertension Blood pressure currently stable.  6.  Depression Resume home regimen of Viibryd.  Doxepin nightly.  7.  Hyperlipidemia Continue home regimen statin, omega-3.  DVT prophylaxis: SCDs Code Status:   Full Family Communication:  Updated patient.  No family at bedside. Disposition Plan:   Patient is from:  [place] facility  Anticipated DC to:  Back to [facility]  Anticipated DC date:  3 to 4 days  Anticipated DC barriers: Stabilization of anemia/hemoglobin, clinical improvement.  Consults called:  Gastroenterology Admission status:  Admit to inpatient/stepdown unit  Severity of Illness:     Irine Seal MD [facility]  How to contact the [facility] Attending or Consulting provider [place] or covering provider during after hours [place], for this patient?   1. Check the care team in [facility] and look for a) attending/consulting [facility] provider listed and b) the [facility] team listed 2. Log into [email or web address] and use [place]'s universal password to access. If you do not have the password, please contact the hospital operator. 3. Locate the [facility] provider you are looking for under [facility] and page to a number that you can be directly reached. 4. If you still have difficulty reaching the provider, please page the [facility] (Director on Call) for the Hospitalists listed on amion for assistance.  12/30/2020, 6:37 PM

## 2020-12-30 NOTE — Progress Notes (Signed)
Patient ID: Molly Lucas, female   DOB: 16-Jul-1946, 75 y.o.   MRN: [id number]    Brief GI note   Consult  Received from ER MD - pt requesting [facility] , has seen [facility] outpt. HX of recurrent GI bleeding, iron deficiency anemia. Pt stable, no anticoagulation.   Black stool today . HGb 4.3, MCV 79   Reportedly hgb 16 in 08/2020   Colon 5/202 negative   EGD 03/2019- large paraesophageal hernia, no [place] erosions , otherwise negative .  Capsule and Enteroscopy  Through Dr Earlean Shawl after that.  GI will see in am  Transfuse   Fe studies . IV PPI BID  NPO in am - will plan for EGD  Tomorrow

## 2020-12-30 NOTE — Telephone Encounter (Signed)
Spoke with patient who was calling in to office to see if a systolic murmur was listed in her cardiac history from 2017. Upon chart review systolic murmur was noted in patients history. Advised patient and patient verbalized understanding.

## 2020-12-30 NOTE — ED Triage Notes (Signed)
Pt arrived via walk in, c/o dark tarry stools. States hx of GI bleed. Has needed blood transfusion and iron infusions in the past. Endorses increased fatigue.

## 2020-12-30 NOTE — Telephone Encounter (Signed)
Patient states when she first saw Dr. Claiborne Billings in 2017 he mentioned hearing a murmur. She would like to know if this is in her record and if not if it can be added.

## 2020-12-31 ENCOUNTER — Encounter (HOSPITAL_COMMUNITY): Payer: Self-pay | Admitting: Internal Medicine

## 2020-12-31 ENCOUNTER — Encounter (HOSPITAL_COMMUNITY)
Admission: EM | Disposition: A | Discharge status: Home Health | Payer: Self-pay | Source: Home / Self Care | Attending: Internal Medicine

## 2020-12-31 DIAGNOSIS — K449 Diaphragmatic hernia without obstruction or gangrene: Secondary | ICD-10-CM

## 2020-12-31 DIAGNOSIS — D500 Iron deficiency anemia secondary to blood loss (chronic): Secondary | ICD-10-CM

## 2020-12-31 DIAGNOSIS — R195 Other fecal abnormalities: Secondary | ICD-10-CM

## 2020-12-31 DIAGNOSIS — K257 Chronic gastric ulcer without hemorrhage or perforation: Secondary | ICD-10-CM

## 2020-12-31 HISTORY — PX: ESOPHAGOGASTRODUODENOSCOPY (EGD) WITH PROPOFOL: SHX5813

## 2020-12-31 LAB — URINALYSIS, COMPLETE (UACMP) WITH MICROSCOPIC
Bacteria, UA: NONE SEEN
Bilirubin Urine: NEGATIVE
Glucose, UA: NEGATIVE mg/dL
Hgb urine dipstick: NEGATIVE
Ketones, ur: NEGATIVE mg/dL
Nitrite: NEGATIVE
Protein, ur: NEGATIVE mg/dL
Specific Gravity, Urine: 1.006 (ref 1.005–1.030)
pH: 6 (ref 5.0–8.0)

## 2020-12-31 LAB — CBC WITH DIFFERENTIAL/PLATELET
Abs Immature Granulocytes: 0.04 10*3/uL (ref 0.00–0.07)
Basophils Absolute: 0.1 10*3/uL (ref 0.0–0.1)
Basophils Relative: 1 %
Eosinophils Absolute: 0.1 10*3/uL (ref 0.0–0.5)
Eosinophils Relative: 2 %
HCT: 23.6 % — ABNORMAL LOW (ref 36.0–46.0)
Hemoglobin: 6.8 g/dL — CL (ref 12.0–15.0)
Immature Granulocytes: 1 %
Lymphocytes Relative: 18 %
Lymphs Abs: 1.2 10*3/uL (ref 0.7–4.0)
MCH: 24.2 pg — ABNORMAL LOW (ref 26.0–34.0)
MCHC: 28.8 g/dL — ABNORMAL LOW (ref 30.0–36.0)
MCV: 84 fL (ref 80.0–100.0)
Monocytes Absolute: 0.6 10*3/uL (ref 0.1–1.0)
Monocytes Relative: 9 %
Neutro Abs: 4.8 10*3/uL (ref 1.7–7.7)
Neutrophils Relative %: 69 %
Platelets: 200 10*3/uL (ref 150–400)
RBC: 2.81 MIL/uL — ABNORMAL LOW (ref 3.87–5.11)
RDW: 19.1 % — ABNORMAL HIGH (ref 11.5–15.5)
WBC: 6.8 10*3/uL (ref 4.0–10.5)
nRBC: 0.3 % — ABNORMAL HIGH (ref 0.0–0.2)

## 2020-12-31 LAB — COMPREHENSIVE METABOLIC PANEL
ALT: 15 U/L (ref 0–44)
AST: 16 U/L (ref 15–41)
Albumin: 3.2 g/dL — ABNORMAL LOW (ref 3.5–5.0)
Alkaline Phosphatase: 69 U/L (ref 38–126)
Anion gap: 9 (ref 5–15)
BUN: 18 mg/dL (ref 8–23)
CO2: 22 mmol/L (ref 22–32)
Calcium: 9 mg/dL (ref 8.9–10.3)
Chloride: 109 mmol/L (ref 98–111)
Creatinine, Ser: 0.64 mg/dL (ref 0.44–1.00)
GFR, Estimated: >60 mL/min (ref >60)
Glucose, Bld: 100 mg/dL — ABNORMAL HIGH (ref 70–99)
Potassium: 3.8 mmol/L (ref 3.5–5.1)
Sodium: 140 mmol/L (ref 135–145)
Total Bilirubin: 1.8 mg/dL — ABNORMAL HIGH (ref 0.3–1.2)
Total Protein: 5.5 g/dL — ABNORMAL LOW (ref 6.5–8.1)

## 2020-12-31 LAB — PHOSPHORUS: Phosphorus: 3.6 mg/dL (ref 2.5–4.6)

## 2020-12-31 LAB — HEMOGLOBIN AND HEMATOCRIT, BLOOD
HCT: 33.9 % — ABNORMAL LOW (ref 36.0–46.0)
Hemoglobin: 10.1 g/dL — ABNORMAL LOW (ref 12.0–15.0)

## 2020-12-31 LAB — PREPARE RBC (CROSSMATCH)

## 2020-12-31 LAB — MAGNESIUM: Magnesium: 2.2 mg/dL (ref 1.7–2.4)

## 2020-12-31 LAB — RESP PANEL BY RT-PCR (FLU A&B, COVID) ARPGX2
Influenza A by PCR: NEGATIVE
Influenza B by PCR: NEGATIVE
SARS Coronavirus 2 by RT PCR: NEGATIVE

## 2020-12-31 SURGERY — ESOPHAGOGASTRODUODENOSCOPY (EGD) WITH PROPOFOL
Anesthesia: Moderate Sedation

## 2020-12-31 MED ORDER — FENTANYL CITRATE (PF) 100 MCG/2ML IJ SOLN
INTRAMUSCULAR | Status: DC | PRN
  Administered 2020-12-31 (×2): 25 ug via INTRAVENOUS

## 2020-12-31 MED ORDER — FENTANYL CITRATE (PF) 100 MCG/2ML IJ SOLN
INTRAMUSCULAR | Status: AC
  Filled 2020-12-31: qty 2

## 2020-12-31 MED ORDER — BUTAMBEN-TETRACAINE-BENZOCAINE 2-2-14 % EX AERO
INHALATION_SPRAY | CUTANEOUS | Status: DC | PRN
  Administered 2020-12-31: 1 via TOPICAL

## 2020-12-31 MED ORDER — SODIUM CHLORIDE 0.9 % IV SOLN
510.0000 mg | Freq: Once | INTRAVENOUS | Status: AC
  Administered 2021-01-01: 510 mg via INTRAVENOUS
  Filled 2020-12-31: qty 510

## 2020-12-31 MED ORDER — DIPHENHYDRAMINE HCL 25 MG PO CAPS
25.0000 mg | ORAL_CAPSULE | Freq: Once | ORAL | Status: AC
  Administered 2020-12-31: 25 mg via ORAL
  Filled 2020-12-31: qty 1

## 2020-12-31 MED ORDER — MIDAZOLAM HCL (PF) 10 MG/2ML IJ SOLN
INTRAMUSCULAR | Status: DC | PRN
  Administered 2020-12-31 (×2): 2 mg via INTRAVENOUS

## 2020-12-31 MED ORDER — ACETAMINOPHEN 325 MG PO TABS
650.0000 mg | ORAL_TABLET | Freq: Once | ORAL | Status: AC
  Administered 2020-12-31: 650 mg via ORAL
  Filled 2020-12-31: qty 2

## 2020-12-31 MED ORDER — SODIUM CHLORIDE 0.9% IV SOLUTION: Freq: Once | INTRAVENOUS | Status: DC

## 2020-12-31 MED ORDER — FUROSEMIDE 10 MG/ML IJ SOLN
20.0000 mg | Freq: Once | INTRAMUSCULAR | Status: AC
  Administered 2020-12-31: 20 mg via INTRAVENOUS
  Filled 2020-12-31: qty 2

## 2020-12-31 MED ORDER — MIDAZOLAM HCL (PF) 5 MG/ML IJ SOLN
INTRAMUSCULAR | Status: AC
  Filled 2020-12-31: qty 2

## 2020-12-31 SURGICAL SUPPLY — 14 items

## 2020-12-31 NOTE — Progress Notes (Signed)
PROGRESS NOTE    [facility]  GEX:[id number] DOB: 1946/09/19 DOA: 12/30/2020 PCP: [name], MD    Chief Complaint  Patient presents with  . Blood In Stools    Brief Narrative:  Patient 75 year old female history of recurrent GI bleed/iron deficiency anemia requiring prior infusions of IV iron in the past, had endoscopy and colonoscopy 03/2019 with no source of bleeding noted.  Patient presented back to the ED with melanotic stools, transient shortness of breath, weakness noted to be severely anemic with a hemoglobin of 4.3.  Patient transfused 2 units packed red blood cells hemoglobin currently at 6.8 this morning.  GI consultation pending.  Patient for probable upper endoscopy and further evaluation today.     Assessment & Plan:   Principal Problem:   Symptomatic anemia Active Problems:   Melena   HLD (hyperlipidemia)   Iron deficiency anemia   OSA (obstructive sleep apnea)   History of gastric ulcer   GERD (gastroesophageal reflux disease)   Depression   Essential hypertension  1 symptomatic severe iron deficiency anemia/melanotic stools Patient presented with symptomatic severe iron deficiency anemia.  Patient noted to have melanotic stools noted this morning.  Patient with some associated dizziness.  Patient does endorse some intermittent shortness of breath which she attributed to traveling.  Hemoglobin on presentation noted at 4.3.  Last hemoglobin noted in epic was 14.4(04/23/2020) patient noted to have had extensive work-up before in the past per patient with recent upper endoscopy and colonoscopy done 03/2019 with no source of anemia or bleeding noted at that time.  Patient also in the past has been on IV iron infusions that she states has been obtaining monthly.  Anemia panel obtained in the ED with iron of 23, ferritin of 3, folate of 20.9, vitamin B12 of 395, TIBC of 577.  2 units of packed red blood cells ordered to be transfused and pending.  Status post  transfusion 2 units packed red blood cells hemoglobin currently at 6.8.  Transfuse another 2 units packed red blood cells.  Will likely benefit from IV Feraheme during this hospitalization and prior to discharge. GI consulted recommended IV PPI, clear liquids overnight and patient currently n.p.o. today for probable upper endoscopy and further evaluation.  GI following.    2.  Acute blood loss anemia/severe iron deficiency anemia Secondary to problem #1.  See #1.  Hemoglobin on admission was 4.3.  Status post transfusion 2 units packed red blood cells with hemoglobin currently at 6.8.  Transfuse another 2 units.  Follow H&H.  Transfusion threshold hemoglobin <7.   3.  OSA CPAP nightly.    4.  Gastroesophageal reflux disease Continue PPI.  5.  Hypertension Blood pressure currently stable.  Follow.   6.  Depression Continue home regimen home regimen of Viibryd.  Doxepin nightly.  7.  Hyperlipidemia Continue home regimen statin, omega-3.   DVT prophylaxis: SCDs Code Status: Full Family Communication: Updated patient.  No family at bedside. Disposition:   Status is: Inpatient    Dispo: The patient is from: [facility]              Anticipated d/c is to: Back to [facility]              Anticipated d/c date is: 2 to 3 days.              Patient currently awaiting evaluation/probable upper endoscopy for melena.  Not stable for discharge.   Difficult to place patient no  Consultants:   Gastroenterology pending 12/31/2020  Procedures:   Transfusion 2 units packed red blood cells 12/30/2020  Transfusion 2 units packed red blood cells pending 12/31/2020  Antimicrobials:  None   Subjective: Patient sitting up in bed.  States she is feeling a whole lot better today than she did on admission after transfusion of packed red blood cells.  Denies any chest pain.  No shortness of breath.  Stated had significant urine output yesterday.  Objective: Vitals:   12/31/20  0500 12/31/20 0600 12/31/20 0700 12/31/20 0800  BP:    (!) 145/74  Pulse: 68 73  76  Resp: (!) 21 17 17  (!) 22  Temp:    97.6 F (36.4 C)  TempSrc:    Oral  SpO2: 100% 98%  97%  Weight:      Height:        Intake/Output Summary (Last 24 hours) at 12/31/2020 1011 Last data filed at 12/31/2020 0400 Gross per 24 hour  Intake 330 ml  Output 1750 ml  Net -1420 ml   Filed Weights   12/30/20 1401  Weight: 79.4 kg    Examination:  General exam: Appears calm and comfortable.  Pallor Respiratory system: Clear to auscultation. Respiratory effort normal. Cardiovascular system: S1 & S2 heard, RRR. No JVD, murmurs, rubs, gallops or clicks.  1+ bilateral lower extremity edema.   Gastrointestinal system: Abdomen is nondistended, soft and nontender. No organomegaly or masses felt. Normal bowel sounds heard. Central nervous system: Alert and oriented. No focal neurological deficits. Extremities: 1+ bilateral lower extremity edema.  Symmetric 5 x 5 power. Skin: No rashes, lesions or ulcers Psychiatry: Judgement and insight appear normal. Mood & affect appropriate.     Data Reviewed: I have personally reviewed following labs and imaging studies  CBC: Recent Labs  Lab 12/30/20 1419 12/31/20 0501  WBC 11.1* 6.8  NEUTROABS 9.2* 4.8  HGB 4.3* 6.8*  HCT 15.6* 23.6*  MCV 79.2* 84.0  PLT 269 811    Basic Metabolic Panel: Recent Labs  Lab 12/30/20 1419 12/31/20 0501  NA 137 140  K 3.9 3.8  CL 105 109  CO2 23 22  GLUCOSE 120* 100*  BUN 32* 18  CREATININE 0.78 0.64  CALCIUM 9.5 9.0  MG  --  2.2  PHOS  --  3.6    GFR: Estimated Creatinine Clearance: 58.4 mL/min (by C-G formula based on SCr of 0.64 mg/dL).  Liver Function Tests: Recent Labs  Lab 12/30/20 1419 12/31/20 0501  AST 14* 16  ALT 15 15  ALKPHOS 81 69  BILITOT 1.1 1.8*  PROT 6.2* 5.5*  ALBUMIN 3.7 3.2*    CBG: No results for input(s): GLUCAP in the last 168 hours.   Recent Results (from the past 240  hour(s))  Resp Panel by RT-PCR (Flu A&B, Covid) Nasopharyngeal Swab     Status: None   Collection Time: 12/30/20  2:45 AM   Specimen: Nasopharyngeal Swab; Nasopharyngeal(NP) swabs in vial transport medium  Result Value Ref Range Status   SARS Coronavirus 2 by RT PCR NEGATIVE NEGATIVE Final    Comment: (NOTE) SARS-CoV-2 target nucleic acids are NOT DETECTED.  The SARS-CoV-2 RNA is generally detectable in upper respiratory specimens during the acute phase of infection. The lowest concentration of SARS-CoV-2 viral copies this assay can detect is 138 copies/mL. A negative result does not preclude SARS-Cov-2 infection and should not be used as the sole basis for treatment or other patient management decisions. A negative result may occur with  improper specimen collection/handling, submission of specimen other than nasopharyngeal swab, presence of viral mutation(s) within the areas targeted by this assay, and inadequate number of viral copies(<138 copies/mL). A negative result must be combined with clinical observations, patient history, and epidemiological information. The expected result is Negative.  Fact Sheet for Patients:  EntrepreneurPulse.com.au  Fact Sheet for Healthcare Providers:  IncredibleEmployment.be  This test is no t yet approved or cleared by the [place] FDA and  has been authorized for detection and/or diagnosis of SARS-CoV-2 by FDA under an Emergency Use Authorization (EUA). This EUA will remain  in effect (meaning this test can be used) for the duration of the COVID-19 declaration under Section 564(b)(1) of the Act, 21 U.S.C.section 360bbb-3(b)(1), unless the authorization is terminated  or revoked sooner.       Influenza A by PCR NEGATIVE NEGATIVE Final   Influenza B by PCR NEGATIVE NEGATIVE Final    Comment: (NOTE) The Xpert Xpress SARS-CoV-2/FLU/RSV plus assay is intended as an aid in the diagnosis of influenza from  Nasopharyngeal swab specimens and should not be used as a sole basis for treatment. Nasal washings and aspirates are unacceptable for Xpert Xpress SARS-CoV-2/FLU/RSV testing.  Fact Sheet for Patients: EntrepreneurPulse.com.au  Fact Sheet for Healthcare Providers: IncredibleEmployment.be  This test is not yet approved or cleared by the [place] FDA and has been authorized for detection and/or diagnosis of SARS-CoV-2 by FDA under an Emergency Use Authorization (EUA). This EUA will remain in effect (meaning this test can be used) for the duration of the COVID-19 declaration under Section 564(b)(1) of the Act, 21 U.S.C. section 360bbb-3(b)(1), unless the authorization is terminated or revoked.  Performed at [facility], [place] [address] [place], [place] [address]   MRSA PCR Screening     Status: None   Collection Time: 12/30/20  7:45 PM   Specimen: Nasal Mucosa; Nasopharyngeal  Result Value Ref Range Status   MRSA by PCR NEGATIVE NEGATIVE Final    Comment:        The GeneXpert MRSA Assay (FDA approved for NASAL specimens only), is one component of a comprehensive MRSA colonization surveillance program. It is not intended to diagnose MRSA infection nor to guide or monitor treatment for MRSA infections. Performed at [facility], [place] [address] [place], [place] [address]          Radiology Studies: No results found.      Scheduled Meds: . sodium chloride   Intravenous Once  . vitamin C  500 mg Oral Daily  . Chlorhexidine Gluconate Cloth  6 each Topical Q0600  . doxepin  6 mg Oral QHS  . furosemide  20 mg Intravenous Once  . loratadine  10 mg Oral Daily  . omega-3 acid ethyl esters  2,000 mg Oral Daily  . pantoprazole (PROTONIX) IV  40 mg Intravenous Q12H  . pravastatin  20 mg Oral QHS  . sodium chloride flush  3 mL Intravenous Q12H  . Vilazodone HCl  40 mg Oral Daily   Continuous  Infusions:   LOS: 1 day    Time spent: 40 minutes    Irine Seal, MD [facility]   To contact the attending provider between 7A-7P or the covering provider during after hours 7P-7A, please log into the web site [email or web address] and access using universal [place] password for that web site. If you do not have the password, please call the hospital operator.  12/31/2020, 10:11 AM

## 2020-12-31 NOTE — TOC Initial Note (Signed)
Transition of Care [facility]) - Initial/Assessment Note    Patient Details  Name: Molly Lucas MRN: [id number] Date of Birth: 19-Dec-1945  Transition of Care [facility]) CM/SW Contact:    Leeroy Cha, RN Phone Number: 12/31/2020, 8:03 AM  Clinical Narrative:                 75 y.o. female with medical history significant of recurrent GI bleed/iron deficiency anemia, ( initially in 2014 where she was treated with IV Feraheme, another episode of anemia in 11/2015 where she had an ulcer with GI bleeding with hemoglobin going as low as 4 and received a blood transfusion, 10/2017 hemoglobin decreased again and received IV Feraheme.  Patient noted to have had a colonoscopy done 03/31/2019 and the upper endoscopy done 03/30/2019 which were unremarkable with no source of bleeding noted.  Patient states was told in the past may have had some AVMs.  Patient also with history of anxiety, chronic pain of the left thumb, depression, gastroesophageal reflux disease.  Patient presented to the ED with sudden onset of large melanotic stool on the day of admission.  Patient endorses bout of nausea and nonbloody emesis on the day of admission, some transient shortness of breath which she attributed to traveling, some dizziness and lightheadedness, some lower extremity edema.  Patient denies any fevers, no chills, no chest pain, no abdominal pain, no dysuria, no constipation, no diarrhea, no syncopal episodes, no hematochezia, no hematemesis, no other associated symptoms.  Patient denies any daily use of NSAIDs however stated approximately a week ago may have used one-time dose of Aleve.  Patient stated she called her doctor's office and was told to present to the ED. Patient states has been vaccinated for COVID-19 x2 as well as received a booster.  ED Course: Patient seen in the ED, comprehensive metabolic profile done with a BUN of 32 protein of 6.2 otherwise unremarkable.  CBC done with a white count of 11.1,  hemoglobin of 4.3, MCV of 79.2, platelet count of 269.  Anemia panel ordered with a iron level of 23, ferritin of 3, folate of 20.9, vitamin B12 of 395, TIBC of 577.  Per ED physician FOBT was positive and rectal exam with melanotic stool noted.  Vitals noted to be stable.  IV Protonix ordered.  ED physician spoke with GI who recommended hospitalist admission and patient will be consulted on in the morning.  COVID-19 PCR pending. PLAN IS TO RETURN TO HOME WITH SELF CARE. Expected Discharge Plan: Home/Self Care Barriers to Discharge: Continued Medical Work up   Patient Goals and CMS Choice Patient states their goals for this hospitalization and ongoing recovery are:: to go home      Expected Discharge Plan and Services Expected Discharge Plan: Home/Self Care   Discharge Planning Services: CM Consult   Living arrangements for the past 2 months: Single Family Home                                      Prior Living Arrangements/Services Living arrangements for the past 2 months: Single Family Home Lives with:: Self Patient language and need for interpreter reviewed:: Yes Do you feel safe going back to the place where you live?: Yes            Criminal Activity/Legal Involvement Pertinent to Current Situation/Hospitalization: No - Comment as needed  Activities of Daily Living Home Assistive Devices/Equipment: Eyeglasses (reading  glasses) ADL Screening (condition at time of admission) Patient's cognitive ability adequate to safely complete daily activities?: Yes Is the patient deaf or have difficulty hearing?: No Does the patient have difficulty seeing, even when wearing glasses/contacts?: No Does the patient have difficulty concentrating, remembering, or making decisions?: No Patient able to express need for assistance with ADLs?: Yes Does the patient have difficulty dressing or bathing?: No Independently performs ADLs?: Yes (appropriate for developmental age) Does the  patient have difficulty walking or climbing stairs?: No Weakness of Legs: Both Weakness of Arms/Hands: Both  Permission Sought/Granted                  Emotional Assessment Appearance:: Appears stated age Attitude/Demeanor/Rapport: Engaged Affect (typically observed): Calm Orientation: : Oriented to Place,Oriented to Self,Oriented to  Time,Oriented to Situation Alcohol / Substance Use: Not Applicable Psych Involvement: No (comment)  Admission diagnosis:  Upper GI bleed [K92.2] Symptomatic anemia [D64.9] Patient Active Problem List   Diagnosis Date Noted  . Essential hypertension 12/26/2020  . Diastolic dysfunction 17/49/4496  . Systolic murmur 75/91/6384  . Red blood cell antibody positive 09/18/2019  . GERD (gastroesophageal reflux disease) 05/19/2019  . Depression 05/19/2019  . History of gastric ulcer 03/29/2019  . Insomnia 03/29/2019  . Chronic gastrointestinal bleeding 03/29/2019  . Excessive daytime sleepiness 06/20/2018  . SOB (shortness of breath) on exertion 06/20/2018  . Fatigue due to depression 06/20/2018  . OSA (obstructive sleep apnea) 06/20/2018  . Trigger thumb of right hand 04/29/2017  . Iron deficiency anemia 04/09/2016  . HLD (hyperlipidemia) 02/26/2016  . Family history of heart disease 02/26/2016  . Trivial Pericardial effusion 02/26/2016  . Anemia 11/29/2015  . Symptomatic anemia 11/29/2015  . Dyspnea   . Melena   . Occult blood in stools   . Primary osteoarthritis of first carpometacarpal joint of left hand 09/30/2015  . Chronic pain of left thumb 08/01/2015  . Recurrent right inguinal hernia 08/15/2013  . Right inguinal hernia 03/14/2013  . Degenerative arthritis of hip 06/24/2012   PCP:  [name], MD Pharmacy:   [facility], [place] [address] Phone: [phone] Fax: [phone]     Social Determinants of Health (SDOH)  Interventions    Readmission Risk Interventions No flowsheet data found.

## 2020-12-31 NOTE — Progress Notes (Signed)
Pt arrived to unit via ED stretcher Pt does not appear to be in distress vss. Pt educated about unit and use of call bell before attempting to get out of bed. Pt states that she understands cell phone and reading glasses in hand.

## 2020-12-31 NOTE — Op Note (Signed)
[facility] Patient Name: Molly Lucas Procedure Date: 12/31/2020 MRN: [id number] Attending MD: Docia Chuck. Henrene Pastor , MD Date of Birth: 12/23/1945 CSN: [id number] Age: 75 Admit Type: Inpatient Procedure:                Upper GI endoscopy Indications:              Iron deficiency anemia secondary to chronic blood                            loss Providers:                Docia Chuck. Henrene Pastor, MD, Cleda Daub, RN, Lesia Sago, Technician Referring MD:             [facility] Medicines:                Monitored Anesthesia Care Complications:            No immediate complications. Estimated Blood Loss:     Estimated blood loss: none. Procedure:                Pre-Anesthesia Assessment:                           - Prior to the procedure, a History and Physical                            was performed, and patient medications and                            allergies were reviewed. The patient's tolerance of                            previous anesthesia was also reviewed. The risks                            and benefits of the procedure and the sedation                            options and risks were discussed with the patient.                            All questions were answered, and informed consent                            was obtained. Prior Anticoagulants: The patient has                            taken no previous anticoagulant or antiplatelet                            agents. ASA Grade Assessment: II - A patient with  mild systemic disease. After reviewing the risks                            and benefits, the patient was deemed in                            satisfactory condition to undergo the procedure.                           After obtaining informed consent, the endoscope was                            passed under direct vision. Throughout the                            procedure, the patient's  blood pressure, pulse, and                            oxygen saturations were monitored continuously. The                            [id number]-[id number] ([id number]) Olympus gastroscope was                            introduced through the mouth, and advanced to the                            third part of duodenum. The upper GI endoscopy was                            accomplished without difficulty. The patient                            tolerated the procedure well. Scope In: Scope Out: Findings:      The esophagus was normal.      The stomach revealed a moderate sized hiatal hernia with several       superficial linear erosions consistent with Cameron erosions.      The examined duodenum was normal.      The cardia and gastric fundus revealed the hiatal hernia on retroflexion. Impression:               1. Recurrent iron deficiency anemia secondary to                            hiatal hernia associated Cameron erosions                           2. Otherwise normal EGD to the third duodenum Moderate Sedation:      Moderate (conscious) sedation was administered by the endoscopy nurse       and supervised by the endoscopist. The following parameters were       monitored: oxygen saturation, heart rate, blood pressure, and response       to care. Total physician intraservice time was 15 minutes. Recommendation:  1. Transfuse to clinically appropriate hemoglobin                           2. Regular diet                           3. Patient will need close monitoring with                            hematology. She will need regular blood counts and                            ferritin levels. She will also need periodic iron                            infusions as the history of [name] erosions is                            that of recurrent iron deficiency anemia if regular                            iron replacement therapy is not provided.                           4. Okay for discharge in  a.m. Procedure Code(s):        --- Professional ---                           [phone], Esophagogastroduodenoscopy, flexible,                            transoral; diagnostic, including collection of                            specimen(s) by brushing or washing, when performed                            (separate procedure) Diagnosis Code(s):        --- Professional ---                           D50.0, Iron deficiency anemia secondary to blood                            loss (chronic) CPT copyright 2019 American Medical Association. All rights reserved. The codes documented in this report are preliminary and upon coder review may  be revised to meet current compliance requirements. Docia Chuck. Henrene Pastor, MD 12/31/2020 3:08:00 PM This report has been signed electronically. Number of Addenda: 0

## 2020-12-31 NOTE — Consult Note (Addendum)
Consultation  Referring Provider:  ERMD Alvino Chapel Primary Care Physician:  [name], MD Primary Gastroenterologist:  [facility]  Reason for Consultation:  Anemia, heme + , melena  HPI: Molly Lucas is a 75 y.o. female ,, who we are asked to see for profound anemia and melena.  Patient presented to the emergency room yesterday after she had had a very dark tarry bowel movement.  Patient has history of recurrent GI bleeding, anemia and iron deficiency. On admission she was found to have hemoglobin of 4.3 hematocrit of 15.6 MCV of 79.  She has been transfused 2 units of packed RBCs. Iron studies show ferritin of 3/serum iron 23/TIBC of 577 and iron sat of 4. Patient states that her regular gastroenterologist is Dr. Earlean Shawl, and that she has had several studies done over the past couple of years for recurrent subacute GI blood loss. She had EGD and colonoscopy here in May 2020 when she presented for similar reasons.  These were done per Dr. Tarri Glenn.  At EGD she was noted to have a large paraesophageal hernia but no evidence of erosions or Cameron erosions and otherwise negative.  Colonoscopy at that same time was normal. She subsequently was seen by Dr. Earlean Shawl and then referred to a physician at [facility] who did enteroscopy.  Per patient report she also had dilation of a Schatzki's ring at that same time, but nothing was found at enteroscopy.  She had undergone capsule endoscopy just prior to that in June 2020 which apparently did show diffuse small bowel AVMs which were nonbleeding. Patient has not had any evidence of recurrent bleeding since that time. She had also been followed in the past by hematology/Dr.Kale and had been on IV iron infusions serially for a period of time and had very regular monitoring of her hemoglobin. Patient says that her hemoglobin was around 16 in October 2021, she says she just had labs done through her PCP last week but did not receive any call about those  results. She had been out of town visiting family over the past couple of weeks and says she did notice some mild increase in fatigue and shortness of breath with exertion but was able to keep up with all of their activities.  She had not noticed any melena or hematochezia.  She has no complaints of abdominal discomfort.  She is not bothered by her large hernia and does not take any PPI. She avoids aspirin and NSAIDs. No further bowel movements or melena since admission and has been hemodynamically stable overnight. Hemoglobin up to 6.8 status post 2 units and is to receive 2 more units today.  Other medical problems include obstructive sleep apnea, hypertension, depression and hyperlipidemia.   Past Medical History:  Diagnosis Date  . Anemia    Takes iron supplement  . Anxiety   . Arthritis   . Chronic pain of left thumb   . Constipation   . 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 Depression   . Fibroid, uterine   . GERD (gastroesophageal reflux disease)   . History of blood transfusion   . Hx of seasonal allergies   . Hyperlipemia   . Hypertension    hx of off medication now.  . OA (osteoarthritis) of hip    left  . Sleep apnea    dentist made mouthipiece     Past Surgical History:  Procedure Laterality Date  . BIOPSY  03/30/2019  Procedure: BIOPSY;  Surgeon: Thornton Park, MD;  Location: [facility] ENDOSCOPY;  Service: Gastroenterology;;  . [place]  08/2015   with abductor pollicis longus tendon transfer and suspensionplasty  . COLONOSCOPY WITH PROPOFOL N/A 03/31/2019   Procedure: COLONOSCOPY WITH PROPOFOL;  Surgeon: Thornton Park, MD;  Location: [place];  Service: Gastroenterology;  Laterality: N/A;  . DE QUERVAIN'S RELEASE  08/2015  . DILATATION & CURRETTAGE/HYSTEROSCOPY WITH RESECTOCOPE  2007  . ESOPHAGOGASTRODUODENOSCOPY N/A 11/30/2015   Procedure: ESOPHAGOGASTRODUODENOSCOPY (EGD);  Surgeon:  Richmond Campbell, MD;  Location: [facility];  Service: Endoscopy;  Laterality: N/A;  . ESOPHAGOGASTRODUODENOSCOPY (EGD) WITH PROPOFOL N/A 03/30/2019   Procedure: ESOPHAGOGASTRODUODENOSCOPY (EGD) WITH PROPOFOL;  Surgeon: Thornton Park, MD;  Location: [place];  Service: Gastroenterology;  Laterality: N/A;  . HERNIA REPAIR Left 06/2012  . INGUINAL HERNIA REPAIR Right 05/03/2013   Procedure: HERNIA REPAIR INGUINAL ADULT;  Surgeon: Harl Bowie, MD;  Location: [place];  Service: General;  Laterality: Right;  . INGUINAL HERNIA REPAIR Right 08/23/2013   Procedure: LAPAROSCOPIC INGUINAL HERNIA;  Surgeon: Harl Bowie, MD;  Location: [place];  Service: General;  Laterality: Right;  . INSERTION OF MESH Right 05/03/2013   Procedure: INSERTION OF MESH;  Surgeon: Harl Bowie, MD;  Location: [place];  Service: General;  Laterality: Right;  . INSERTION OF MESH Right 08/23/2013   Procedure: INSERTION OF MESH;  Surgeon: Harl Bowie, MD;  Location: [place];  Service: General;  Laterality: Right;  . METACARPOPHALANGEAL JOINT ARTHRODESIS  08/2015   with local bone graft  . TONSILLECTOMY    . TOTAL HIP ARTHROPLASTY  06/24/2012   Procedure: TOTAL HIP ARTHROPLASTY ANTERIOR APPROACH;  Surgeon: Mcarthur Rossetti, MD;  Location: [facility];  Service: Orthopedics;  Laterality: Left;  Left total hip arthroplasty, anterior approach  . UTERINE FIBROID SURGERY  2007  . WISDOM TOOTH EXTRACTION      Prior to Admission medications   Medication Sig Start Date End Date Taking? Authorizing Provider  CALCIUM PO Take 2 tablets by mouth daily.    Yes [provider]  cetirizine (ZYRTEC) 10 MG tablet Take 10 mg by mouth daily.   Yes [provider]  Cholecalciferol (VITAMIN D3) 250 MCG (10000 UT) TABS Take 10,000 Units by mouth daily.    Yes [provider]  CYANOCOBALAMIN PO Take 1 tablet by mouth daily.   Yes [provider]  Doxepin HCl 6 MG TABS Take 6 mg by mouth at  bedtime.    Yes [provider]  Multiple Vitamins-Minerals (HAIR SKIN AND NAILS FORMULA PO) Take 1 tablet by mouth daily.   Yes [provider]  multivitamin-lutein (OCUVITE-LUTEIN) CAPS Take 1 capsule by mouth daily.   Yes [provider]  naproxen sodium (ALEVE) 220 MG tablet Take 220 mg by mouth 2 (two) times daily as needed (headache/pain).   Yes [provider]  NON FORMULARY Take 1 capsule by mouth See admin instructions. [place] oil capsules: Take 1 capsule by mouth once a day   Yes [provider]  Omega-3 1000 MG CAPS Take 2,000 mg by mouth daily.    Yes [provider]  pravastatin (PRAVACHOL) 20 MG tablet Take 20 mg by mouth at bedtime.    Yes [provider]  Turmeric 500 MG CAPS Take 500 mg by mouth daily. Tumeric with cucumin   Yes [provider]  VIIBRYD 40 MG TABS Take 40 mg by mouth daily. 12/24/20  Yes [provider]  vitamin C (ASCORBIC ACID) 500 MG tablet Take 500 mg by mouth daily.   Yes [provider]  hydrochlorothiazide (MICROZIDE) 12.5 MG capsule Take 2 tablets today and one tablet Friday and one on Saturday 12/26/20   Erlene Quan, PA-C    Current Facility-Administered Medications  Medication Dose Route Frequency Provider Last Rate Last Admin  . 0.9 %  sodium chloride infusion (Manually program via Guardrails IV Fluids)   Intravenous Once Eugenie Filler, MD      . acetaminophen (TYLENOL) tablet 650 mg  650 mg Oral Q6H PRN Eugenie Filler, MD       Or  . acetaminophen (TYLENOL) suppository 650 mg  650 mg Rectal Q6H PRN Eugenie Filler, MD      . acetaminophen (TYLENOL) tablet 650 mg  650 mg Oral Once Eugenie Filler, MD      . ascorbic acid (VITAMIN C) tablet 500 mg  500 mg Oral Daily Eugenie Filler, MD   500 mg at 12/30/20 2158  . Chlorhexidine Gluconate Cloth 2 % PADS 6 each  6 each Topical Q0600 Eugenie Filler, MD   6 each at 12/30/20 2049  .  diphenhydrAMINE (BENADRYL) capsule 25 mg  25 mg Oral Once Eugenie Filler, MD      . doxepin [facility]) 10 MG/ML solution 6 mg  6 mg Oral QHS Efraim Kaufmann, RPH   6 mg at 12/30/20 2206  . furosemide (LASIX) injection 20 mg  20 mg Intravenous Once Eugenie Filler, MD      . loratadine (CLARITIN) tablet 10 mg  10 mg Oral Daily Eugenie Filler, MD   10 mg at 12/30/20 2158  . metoprolol tartrate (LOPRESSOR) injection 5 mg  5 mg Intravenous Q6H PRN Eugenie Filler, MD      . omega-3 acid ethyl esters (LOVAZA) capsule 2,000 mg  2,000 mg Oral Daily Eugenie Filler, MD      . ondansetron [facility]) tablet 4 mg  4 mg Oral Q6H PRN Eugenie Filler, MD       Or  . ondansetron [facility]) injection 4 mg  4 mg Intravenous Q6H PRN Eugenie Filler, MD      . pantoprazole (PROTONIX) injection 40 mg  40 mg Intravenous Q12H Eugenie Filler, MD   40 mg at 12/30/20 1929  . pravastatin (PRAVACHOL) tablet 20 mg  20 mg Oral QHS Eugenie Filler, MD   20 mg at 12/30/20 2159  . senna-docusate (Senokot-S) tablet 1 tablet  1 tablet Oral QHS PRN Eugenie Filler, MD      . sodium chloride flush (NS) 0.9 % injection 3 mL  3 mL Intravenous Q12H Eugenie Filler, MD      . sorbitol 70 % solution 30 mL  30 mL Oral Daily PRN Eugenie Filler, MD      . Vilazodone HCl TABS 40 mg  40 mg Oral Daily Eugenie Filler, MD   40 mg at 12/30/20 2148    Allergies as of 12/30/2020 - Review Complete 12/30/2020  Allergen Reaction Noted  . Penicillins Swelling and Other (See Comments) 04/25/2012    Family History  Problem Relation Age of Onset  . Heart failure Mother   . Osteoporosis Mother   . Arthritis Mother   . CAD Maternal Grandfather   . Heart failure Maternal Grandfather   . Dementia Maternal Grandfather   . CAD Paternal Grandmother   . CAD Paternal Grandfather   .  Arthritis Sister     Social History   Socioeconomic History  . Marital status: Divorced    Spouse name: Not on file  .  Number of children: Not on file  . Years of education: Not on file  . Highest education level: Not on file  Occupational History  . Not on file  Tobacco Use  . Smoking status: Never Smoker  . Smokeless tobacco: Never Used  Vaping Use  . Vaping Use: Never used  Substance and Sexual Activity  . Alcohol use: Yes    Alcohol/week: 2.0 standard drinks    Types: 2 Glasses of wine per week    Comment: occasional glass of wine   . Drug use: No  . Sexual activity: Not on file  Other Topics Concern  . Not on file  Social History Narrative  . Not on file   Social Determinants of Health   Financial Resource Strain: Not on file  Food Insecurity: Not on file  Transportation Needs: Not on file  Physical Activity: Not on file  Stress: Not on file  Social Connections: Not on file  Intimate Partner Violence: Not on file    Review of Systems: Pertinent positive and negative review of systems were noted in the above HPI section.  All other review of systems was otherwise negative.  Physical Exam: Vital signs in last 24 hours: Temp:  [97.8 F (36.6 C)-99.4 F (37.4 C)] 97.8 F (36.6 C) (02/22 0308) Pulse Rate:  [65-95] 67 (02/22 0308) Resp:  [13-21] 14 (02/22 0308) BP: (111-121)/(56-67) 111/61 (02/22 0308) SpO2:  [93 %-100 %] 100 % (02/22 0308) Weight:  [79.4 kg] 79.4 kg (02/21 1401) Last BM Date: 12/30/20 General:   Alert,  Well-developed, well-nourished, older white female pleasant and cooperative in NAD Head:  Normocephalic and atraumatic. Eyes:  Sclera clear, no icterus.   Conjunctiva pale Ears:  Normal auditory acuity. Nose:  No deformity, discharge,  or lesions. Mouth:  No deformity or lesions.   Neck:  Supple; no masses or thyromegaly. Lungs:  Clear throughout to auscultation.   No wheezes, crackles, or rhonchi. Heart:  Regular rate and rhythm; no murmurs, clicks, rubs,  or gallops. Abdomen:  Soft,nontender, BS active,nonpalp mass or hsm.   Rectal:  Deferred -documented heme  positive yesterday Msk:  Symmetrical without gross deformities. . Pulses:  Normal pulses noted. Extremities:  Without clubbing or edema. Neurologic:  Alert and  oriented x4;  grossly normal neurologically. Skin:  Intact without significant lesions or rashes.. Psych:  Alert and cooperative. Normal mood and affect.  Intake/Output from previous day: 02/21 0701 - 02/22 0700 In: 330 [Blood:330] Out: 4782 [NFAOZ:[id number]] Intake/Output this shift: No intake/output data recorded.  Lab Results: Recent Labs    12/30/20 1419 12/31/20 0501  WBC 11.1* 6.8  HGB 4.3* 6.8*  HCT 15.6* 23.6*  PLT 269 200   BMET Recent Labs    12/30/20 1419 12/31/20 0501  NA 137 140  K 3.9 3.8  CL 105 109  CO2 23 22  GLUCOSE 120* 100*  BUN 32* 18  CREATININE 0.78 0.64  CALCIUM 9.5 9.0   LFT Recent Labs    12/31/20 0501  PROT 5.5*  ALBUMIN 3.2*  AST 16  ALT 15  ALKPHOS 69  BILITOT 1.8*   PT/INR No results for input(s): LABPROT, INR in the last 72 hours. Hepatitis Panel No results for input(s): HEPBSAG, HCVAB, HEPAIGM, HEPBIGM in the last 72 hours.     IMPRESSION:  #48 75 year old white female  with history of recurrent iron deficiency anemia and prior history of acute and subacute GI blood loss. Patient presented yesterday with melena and was found to have hemoglobin of 4 and significant iron deficiency. She had not noticed any melena prior to yesterday and has had no further bowel movement since. She had not had her hemoglobin checked in the past couple of months, suspect she has slowly been drifting. Patient had previously documented small bowel AVMs on capsule endoscopy June 2020/Dr. Medoff.  By patient report I believe these were too distal to be seen at enteroscopy.  Suspect she has been intermittent leak chronically losing blood from AVMs.  Also consider [name] erosions with large paraesophageal hernia  #2 history of large paraesophageal hernia-generally asymptomatic and not requiring  any chronic PPI #3 sleep apnea 4.  Hypertension 5.  Depression 6.  Hyperlipidemia  Plan; Patient has been placed on IV PPI twice daily She is to receive 2 more units of packed RBCs today Would give 1 dose of IV iron prior to discharge and then plan second dose in a week.  Patient would like to be reestablished with Dr. Irene Limbo for monitoring. Patient is scheduled for EGD and enteroscopy with Dr. Henrene Pastor this afternoon.  Procedure was discussed in detail with the patient including indications risks and benefits and she is agreeable to proceed. Patient was advised to have serial hemoglobins on discharge through her PCP or hematology, and these may need to be done on a every 2 week interval in the short-term to assure stability.  Amy Esterwood PA-C 12/31/2020, 8:28 AM  GI ATTENDING  History, laboratories, x-rays, prior endoscopy reports reviewed.  Patient personally seen and examined.  She is admitted with marked iron deficiency anemia and reported history of dark stools.  Previous GI work-ups as outlined.  Plans for upper endoscopy/small bowel enteroscopy today.The nature of the procedure, as well as the risks, benefits, and alternatives were carefully and thoroughly reviewed with the patient. Ample time for discussion and questions allowed. The patient understood, was satisfied, and agreed to proceed.  Agree that she needs to have her blood counts monitored closely.  A ferritin level of 3 suggests chronic bleeding leading up to this episode.  Docia Chuck. Geri Seminole., M.D. [facility] Division of Gastroenterology

## 2020-12-31 NOTE — Progress Notes (Signed)
Patient refuses nocturnal CPAP. She states she has never been able to tolerate it and her dentist has made her a new mouthpiece to use. However, she failed to bring her mouthpiece with her to the hospital.

## 2021-01-01 ENCOUNTER — Ambulatory Visit (HOSPITAL_COMMUNITY): Payer: Medicare Other

## 2021-01-01 ENCOUNTER — Encounter (HOSPITAL_COMMUNITY): Payer: Self-pay | Admitting: Internal Medicine

## 2021-01-01 DIAGNOSIS — D649 Anemia, unspecified: Secondary | ICD-10-CM

## 2021-01-01 DIAGNOSIS — K257 Chronic gastric ulcer without hemorrhage or perforation: Secondary | ICD-10-CM

## 2021-01-01 DIAGNOSIS — K449 Diaphragmatic hernia without obstruction or gangrene: Secondary | ICD-10-CM

## 2021-01-01 LAB — TYPE AND SCREEN
ABO/RH(D): O POS
Antibody Screen: NEGATIVE
Donor AG Type: NEGATIVE
Donor AG Type: NEGATIVE
Donor AG Type: NEGATIVE
Donor AG Type: NEGATIVE
PT AG Type: NEGATIVE
Unit division: 0
Unit division: 0
Unit division: 0
Unit division: 0

## 2021-01-01 LAB — CBC
HCT: 27.9 % — ABNORMAL LOW (ref 36.0–46.0)
Hemoglobin: 8.4 g/dL — ABNORMAL LOW (ref 12.0–15.0)
MCH: 24.6 pg — ABNORMAL LOW (ref 26.0–34.0)
MCHC: 30.1 g/dL (ref 30.0–36.0)
MCV: 81.6 fL (ref 80.0–100.0)
Platelets: 213 10*3/uL (ref 150–400)
RBC: 3.42 MIL/uL — ABNORMAL LOW (ref 3.87–5.11)
RDW: 19.5 % — ABNORMAL HIGH (ref 11.5–15.5)
WBC: 5.6 10*3/uL (ref 4.0–10.5)
nRBC: 0.5 % — ABNORMAL HIGH (ref 0.0–0.2)

## 2021-01-01 LAB — BPAM RBC
Blood Product Expiration Date: 202203212359
Blood Product Expiration Date: 202203272359
Blood Product Expiration Date: 202203272359
Blood Product Expiration Date: 202203272359
ISSUE DATE / TIME: 202202212047
ISSUE DATE / TIME: 202202220034
ISSUE DATE / TIME: 202202221015
ISSUE DATE / TIME: 202202221214
Unit Type and Rh: 5100
Unit Type and Rh: 5100
Unit Type and Rh: 5100
Unit Type and Rh: 5100

## 2021-01-01 LAB — BASIC METABOLIC PANEL
Anion gap: 8 (ref 5–15)
BUN: 21 mg/dL (ref 8–23)
CO2: 24 mmol/L (ref 22–32)
Calcium: 8.9 mg/dL (ref 8.9–10.3)
Chloride: 108 mmol/L (ref 98–111)
Creatinine, Ser: 0.87 mg/dL (ref 0.44–1.00)
GFR, Estimated: >60 mL/min (ref >60)
Glucose, Bld: 101 mg/dL — ABNORMAL HIGH (ref 70–99)
Potassium: 4 mmol/L (ref 3.5–5.1)
Sodium: 140 mmol/L (ref 135–145)

## 2021-01-01 LAB — URINE CULTURE: Culture: <10000 — AB

## 2021-01-01 MED ORDER — PANTOPRAZOLE SODIUM 40 MG PO TBEC: 40.0000 mg | DELAYED_RELEASE_TABLET | Freq: Two times a day (BID) | ORAL | 2 refills | Status: DC

## 2021-01-01 NOTE — Evaluation (Signed)
Physical Therapy Evaluation Patient Details Name: Molly Lucas MRN: [id number] DOB: 03-13-1946 Today's Date: 01/01/2021   History of Present Illness  Patient 75 year old female history of recurrent GI bleed/iron deficiency anemia requiring prior infusions of IV iron in the past, had endoscopy and colonoscopy 03/2019 with no source of bleeding noted.  Patient presented back to the ED with melanotic stools, transient shortness of breath, weakness noted to be severely anemic with a hemoglobin of 4.3, transfused 2 units. S/P UGI endoscopy 12/31/20/  Clinical Impression  The patient eager to ambulate. Used RW x 300' with slow and steady gait.VSS. Patient did not report feeling dizzy. Patient rested  Few minutes and ambulated x  80' without use of RW, reported  Feeling less steady, noted increased dyspnea and required a HHA to steady for ~ 40'. Patient reports that she has to ambulate quite a distance to  Get to the dining room. Patient may benefit from HHPT to ensure return to safe  Ambulation for long distances.   Pt admitted with above diagnosis.  Pt currently with functional limitations due to the deficits listed below (see PT Problem List). Pt will benefit from skilled PT to increase their independence and safety with mobility to allow discharge to the venue listed below.       Follow Up Recommendations Home health PT    Equipment Recommendations  None recommended by PT    Recommendations for Other Services       Precautions / Restrictions Precautions Precautions: Fall Precaution Comments: may be  lightheaded      Mobility  Bed Mobility Overal bed mobility: Modified Independent                  Transfers Overall transfer level: Needs assistance Equipment used: Rolling walker (2 wheeled);None Transfers: Sit to/from Stand Sit to Stand: Min guard         General transfer comment: extra effort to power up from bed/recliner  Ambulation/Gait Ambulation/Gait  assistance: Min guard Gait Distance (Feet): 300 Feet Assistive device: Rolling walker (2 wheeled) Gait Pattern/deviations: Step-through pattern Gait velocity: decr   General Gait Details: gait slow and steady with RW. Patient with increased dyspnea and less steady withoout RW.  Stairs            Wheelchair Mobility    Modified Rankin (Stroke Patients Only)       Balance Overall balance assessment: Mild deficits observed, not formally tested                                           Pertinent Vitals/Pain Pain Assessment: No/denies pain    Home Living Family/patient expects to be discharged to:: Private residence Living Arrangements: Alone Available Help at Discharge: Neighbor Type of Home: [facility]) Home Access: [place]: One [place]: [place] - single point;Walker - 2 wheels;Grab bars - toilet;Grab bars - tub/shower      Prior Function Level of Independence: Independent         Comments: drives, gets her own groceries     Hand Dominance   Dominant Hand: Right    Extremity/Trunk Assessment   Upper Extremity Assessment Upper Extremity Assessment: Generalized weakness    Lower Extremity Assessment Lower Extremity Assessment: Generalized weakness    Cervical / Trunk Assessment Cervical / Trunk Assessment: Normal  Communication  Communication: No difficulties  Cognition Arousal/Alertness: Awake/alert Behavior During Therapy: WFL for tasks assessed/performed Overall Cognitive Status: Within Functional Limits for tasks assessed                                        General Comments      Exercises     Assessment/Plan    PT Assessment Patient needs continued PT services  PT Problem List Decreased strength;Decreased mobility;Decreased activity tolerance;Decreased balance;Decreased knowledge of use of DME       PT Treatment  Interventions DME instruction;Gait training;Functional mobility training;Therapeutic exercise;Patient/family education;Therapeutic activities    PT Goals (Current goals can be found in the Care Plan section)  Acute Rehab PT Goals Patient Stated Goal: to go home PT Goal Formulation: With patient Time For Goal Achievement: 01/15/21 Potential to Achieve Goals: Good    Frequency Min 3X/week   Barriers to discharge        Co-evaluation PT/OT/SLP Co-Evaluation/Treatment: Yes Reason for Co-Treatment: For patient/therapist safety PT goals addressed during session: Mobility/safety with mobility         AM-PAC PT "6 Clicks" Mobility  Outcome Measure Help needed turning from your back to your side while in a flat bed without using bedrails?: None Help needed moving from lying on your back to sitting on the side of a flat bed without using bedrails?: None Help needed moving to and from a bed to a chair (including a wheelchair)?: A Little Help needed standing up from a chair using your arms (e.g., wheelchair or bedside chair)?: A Little Help needed to walk in hospital room?: A Little Help needed climbing 3-5 steps with a railing? : A Little 6 Click Score: 20    End of Session   Activity Tolerance: Patient limited by fatigue;Patient tolerated treatment well Patient left: in chair;with call bell/phone within reach Nurse Communication: Mobility status PT Visit Diagnosis: Difficulty in walking, not elsewhere classified (R26.2)    Time: 0981-1914 PT Time Calculation (min) (ACUTE ONLY): 32 min   Charges:   PT Evaluation $PT Eval Low Complexity: [place] Pager [phone] Office [phone]   Claretha Cooper 01/01/2021, 9:53 AM

## 2021-01-01 NOTE — Progress Notes (Signed)
Occupational Therapy Evaluation  Patient lives in [place] apartment at [facility] and is I at baseline with self care, IADLs, walks to dining room for meals. Currently patient with decreased activity tolerance and balance, needing min G to min support with functional ambulation without AD and supervision level with rolling walker. Pt also reports slight lightheadedness and fatigue with functional ambulation, however vital signs stable. Recommend continued acute OT services to maximize patient endurance and balance necessary to return home at I level.    01/01/21 1000  OT Visit Information  Last OT Received On 01/01/21  Assistance Needed +1  PT/OT/SLP Co-Evaluation/Treatment Yes  Reason for Co-Treatment For patient/therapist safety  OT goals addressed during session ADL's and self-care  History of Present Illness Patient 75 year old female history of recurrent GI bleed/iron deficiency anemia requiring prior infusions of IV iron in the past, had endoscopy and colonoscopy 03/2019 with no source of bleeding noted.  Patient presented back to the ED with melanotic stools, transient shortness of breath, weakness noted to be severely anemic with a hemoglobin of 4.3, transfused 2 units. S/P UGI endoscopy 12/31/20/  Precautions  Precautions Fall  Precaution Comments may be  lightheaded  Restrictions  Weight Bearing Restrictions No  Home Living  Family/patient expects to be discharged to: Private residence  Living Arrangements Alone  Available Help at Discharge Neighbor  Type of [place] [facility] facility [facility])  [place] One level  Bathroom Shower/Tub Walk-in shower;Tub/shower unit  Friendly - single point;Walker - 2 wheels;Grab bars - toilet;Grab bars - tub/shower  Prior Function  Level of Independence Independent  Comments drives, gets her own groceries  Communication  Communication No difficulties  Pain  Assessment  Pain Assessment No/denies pain  Cognition  Arousal/Alertness Awake/alert  Behavior During Therapy WFL for tasks assessed/performed  Overall Cognitive Status Within Functional Limits for tasks assessed  Upper Extremity Assessment  Upper Extremity Assessment Overall WFL for tasks assessed  Lower Extremity Assessment  Lower Extremity Assessment Defer to PT evaluation  Cervical / Trunk Assessment  Cervical / Trunk Assessment Normal  ADL  Overall ADL's  Needs assistance/impaired  Eating/Feeding Independent;Sitting  Grooming Supervision/safety;Standing  Upper Body Bathing Set up;Sitting  Lower Body Bathing Min guard;Sit to/from stand  Upper Body Dressing  Set up;Sitting  Lower Body Dressing Supervision/safety;Min guard;Sit to/from stand;Sitting/lateral leans  Lower Body Dressing Details (indicate cue type and reason) seated patient is able to don shoes without assistance, min G in standing especially without AD as patient is mildy unsteady and fatigues more quickly  Toilet Transfer Min guard;Ambulation  Toilet Transfer Details (indicate cue type and reason) simulated with functional ambulation and transfer to chair, with RW patient closer to S level however without AD patient is more unsteady with min G for safety  Toileting- Clothing Manipulation and Hygiene Min guard;Sit to/from stand  Functional mobility during ADLs Supervision/safety;Min guard;Rolling walker  General ADL Comments patient requiring increased assistance with self care due to decreased activity tolerance and balance  Bed Mobility  Overal bed mobility Modified Independent  Transfers  Overall transfer level Needs assistance  Equipment used Rolling walker (2 wheeled);None  Transfers Sit to/from Stand  Sit to [employer] guard  General transfer comment extra effort to power up from bed/recliner  Balance  Overall balance assessment Mild deficits observed, not formally tested  General Comments  General comments  (skin integrity, edema, etc.) VSS on RA  OT - End of Session  Equipment Utilized  During Treatment Rolling walker  Activity Tolerance Patient tolerated treatment well  Patient left in chair;with call bell/phone within reach  Nurse Communication Mobility status  OT Assessment  OT Recommendation/Assessment Patient needs continued OT Services  OT Visit Diagnosis Unsteadiness on feet (R26.81)  OT Problem List Decreased activity tolerance;Impaired balance (sitting and/or standing)  OT Plan  OT Frequency (ACUTE ONLY) Min 2X/week  OT Treatment/Interventions (ACUTE ONLY) Self-care/ADL training;Balance training;Patient/family education;Therapeutic activities;DME and/or AE instruction  AM-PAC OT "6 Clicks" Daily Activity Outcome Measure (Version 2)  Help from another person eating meals? 4  Help from another person taking care of personal grooming? 3  Help from another person toileting, which includes using toliet, bedpan, or urinal? 3  Help from another person bathing (including washing, rinsing, drying)? 3  Help from another person to put on and taking off regular upper body clothing? 3  Help from another person to put on and taking off regular lower body clothing? 3  6 Click Score 19  OT Recommendation  Follow Up Recommendations No OT follow up  OT Equipment Other (comment) (RW)  Individuals Consulted  Consulted and Agree with Results and Recommendations Patient  Acute Rehab OT Goals  Patient Stated Goal to go home  OT Goal Formulation With patient  Time For Goal Achievement 01/15/21  Potential to Achieve Goals Good  OT Time Calculation  OT Start Time (ACUTE ONLY) 0747  OT Stop Time (ACUTE ONLY) 0817  OT Time Calculation (min) 30 min  OT General Charges  $OT Visit 1 Visit  OT Evaluation  $OT Eval Low Complexity 1 Low  Written Expression  Dominant Hand Right   Delbert Phenix OT OT pager: [phone]

## 2021-01-01 NOTE — TOC Transition Note (Addendum)
Transition of Care [facility]) - CM/SW Discharge Note   Patient Details  Name: Molly Lucas MRN: [id number] Date of Birth: 06-01-1946  [facility] CM/SW Contact:  Leeroy Cha, RN Phone Number: 01/01/2021, 10:31 AM   Clinical Narrative:    Patient being dcd [facility] -P.T. will be done through [facility].  Representative will contact patient at the home phone to set up home evaluation and plan. 1310/rolling walker ordered through [employer] via text  Final next level of care: [place] Barriers to Discharge: Barriers Resolved   Patient Goals and CMS Choice Patient states their goals for this hospitalization and ongoing recovery are:: to go home   Choice offered to / list presented to : Patient  Discharge Placement                       Discharge Plan and Services   Discharge Planning Services: CM Consult Post Acute Care Choice: Home Health                    HH Arranged: PT [place]: [facility] (formerly [employer]) Date [place]: 01/01/21 Time [place]: 1031 Representative spoke with at [place]: [place] ([place]) Interventions     Readmission Risk Interventions No flowsheet data found.

## 2021-01-01 NOTE — Discharge Summary (Signed)
Physician Discharge Summary  Arnett Galindez [facility] CWC:[id number] DOB: 1946-08-03 DOA: 12/30/2020  PCP: [name], MD  Admit date: 12/30/2020 Discharge date: 01/01/2021  Admitted From: Home Disposition:  Home  Recommendations for Outpatient Follow-up:  1. Follow up with PCP in 1 week 2. Follow up with Dr. Irene Limbo for iron infusions as outpatient  3. Follow up with GI 4. Please obtain CBC in 1 week to check stability of Hgb.   Discharge Condition: Stable CODE STATUS: Full  Diet recommendation:  Diet Orders (From admission, onward)    Start     Ordered   12/31/20 1643  Diet regular Room service appropriate? Yes; Fluid consistency: Thin  Diet effective now       Question Answer Comment  Room service appropriate? Yes   Fluid consistency: Thin      12/31/20 1642         Brief/Interim Summary: Molly Lucas is a 75 year old female history of recurrent GI bleed/iron deficiency anemia requiring prior infusions of IV iron in the past, had endoscopy and colonoscopy 03/2019 with no source of bleeding noted.  Patient presented back to the ED with melanotic stools, transient shortness of breath, weakness noted to be severely anemic with a hemoglobin of 4.3.  Patient transfused total 4 units packed red blood cells. Underwent EGD on 12/31/2020 which found hiatal hernia associated Cameron erosions. Patient was given IV iron on 2/23. GI recommended regular CBC and ferritin levels, periodic iron infusions as the history of [name] erosions is that of recurrent iron deficiency anemia if regular iron replacement therapy is not provided.  Discharge Diagnoses:  Principal Problem:   Symptomatic anemia Active Problems:   Melena   HLD (hyperlipidemia)   Iron deficiency anemia   OSA (obstructive sleep apnea)   History of gastric ulcer   GERD (gastroesophageal reflux disease)   Depression   Essential hypertension   Hiatal hernia   Chronic gastric erosion    Discharge  Instructions  Discharge Instructions    Call MD for:  difficulty breathing, headache or visual disturbances   Complete by: As directed    Call MD for:  extreme fatigue   Complete by: As directed    Call MD for:  persistant dizziness or light-headedness   Complete by: As directed    Call MD for:  persistant nausea and vomiting   Complete by: As directed    Call MD for:  severe uncontrolled pain   Complete by: As directed    Discharge instructions   Complete by: As directed    You were cared for by a hospitalist during your hospital stay. If you have any questions about your discharge medications or the care you received while you were in the hospital after you are discharged, you can call the unit and ask to speak with the hospitalist on call if the hospitalist that took care of you is not available. Once you are discharged, your primary care physician will handle any further medical issues. Please note that NO REFILLS for any discharge medications will be authorized once you are discharged, as it is imperative that you return to your primary care physician (or establish a relationship with a primary care physician if you do not have one) for your aftercare needs so that they can reassess your need for medications and monitor your lab values.   Increase activity slowly   Complete by: As directed      Allergies as of 01/01/2021      Reactions  Penicillins Swelling, Other (See Comments)   Tongue became swollen and turned black Did it involve swelling of the face/tongue/throat, SOB, or low BP? Yes Did it involve sudden or severe rash/hives, skin peeling, or any reaction on the inside of your mouth or nose? Yes Did you need to seek medical attention at a hospital or doctor's office? Yes When did it last happen? "I was a child" If all above answers are "NO", may proceed with cephalosporin use.      Medication List    STOP taking these medications   hydrochlorothiazide 12.5 MG  capsule Commonly known as: MICROZIDE   naproxen sodium 220 MG tablet Commonly known as: ALEVE     TAKE these medications   CALCIUM PO Take 2 tablets by mouth daily.   cetirizine 10 MG tablet Commonly known as: ZYRTEC Take 10 mg by mouth daily.   CYANOCOBALAMIN PO Take 1 tablet by mouth daily.   Doxepin HCl 6 MG Tabs Take 6 mg by mouth at bedtime.   HAIR SKIN AND NAILS FORMULA PO Take 1 tablet by mouth daily.   multivitamin-lutein Caps capsule Take 1 capsule by mouth daily.   NON FORMULARY Take 1 capsule by mouth See admin instructions. Black Cumiun oil capsules: Take 1 capsule by mouth once a day   Omega-3 1000 MG Caps Take 2,000 mg by mouth daily.   pantoprazole 40 MG tablet Commonly known as: Protonix Take 1 tablet (40 mg total) by mouth 2 (two) times daily before a meal.   pravastatin 20 MG tablet Commonly known as: PRAVACHOL Take 20 mg by mouth at bedtime.   Turmeric 500 MG Caps Take 500 mg by mouth daily. Tumeric with cucumin   Viibryd 40 MG Tabs Generic drug: Vilazodone HCl Take 40 mg by mouth daily.   vitamin C 500 MG tablet Commonly known as: ASCORBIC ACID Take 500 mg by mouth daily.   Vitamin D3 250 MCG (10000 UT) Tabs Take 10,000 Units by mouth daily.       Follow-up Information    [name], MD. Schedule an appointment as soon as possible for a visit in 1 week(s).   Specialty: Internal Medicine Contact information: [place] [address] [phone]        Brunetta Genera, MD Follow up.   Specialties: Hematology, Oncology Contact information: [place] [address] [phone]        Richmond Campbell, MD. Schedule an appointment as soon as possible for a visit in 1 week(s).   Specialty: Gastroenterology Contact information: [address] [place] [address] [phone]              Allergies  Allergen Reactions  . Penicillins Swelling and Other (See  Comments)    Tongue became swollen and turned black Did it involve swelling of the face/tongue/throat, SOB, or low BP? Yes Did it involve sudden or severe rash/hives, skin peeling, or any reaction on the inside of your mouth or nose? Yes Did you need to seek medical attention at a hospital or doctor's office? Yes When did it last happen? "I was a child" If all above answers are "NO", may proceed with cephalosporin use.     Consultations:  GI   Procedures/Studies: No results found.  EGD 12/31/20 Findings:      The esophagus was normal.      The stomach revealed a moderate sized hiatal hernia with several       superficial linear erosions consistent with  Cameron erosions.      The examined duodenum was normal.      The cardia and gastric fundus revealed the hiatal hernia on retroflexion. Impression:       1. Recurrent iron deficiency anemia secondary to                            hiatal hernia associated Cameron erosions                      2. Otherwise normal EGD to the third duodenum    Discharge Exam: Vitals:   01/01/21 0400 01/01/21 0742  BP:    Pulse: 66   Resp: 18   Temp: 98.2 F (36.8 C) 98.5 F (36.9 C)  SpO2: 93%      General: Pt is alert, awake, not in acute distress Cardiovascular: RRR, S1/S2 +, no edema Respiratory: CTA bilaterally, no wheezing, no rhonchi, no respiratory distress, no conversational dyspnea  Abdominal: Soft, NT, ND, bowel sounds + Extremities: no edema, no cyanosis Psych: Normal mood and affect, stable judgement and insight     The results of significant diagnostics from this hospitalization (including imaging, microbiology, ancillary and laboratory) are listed below for reference.     Microbiology: Recent Results (from the past 240 hour(s))  Resp Panel by RT-PCR (Flu A&B, Covid) Nasopharyngeal Swab     Status: None   Collection Time: 12/30/20  2:45 AM   Specimen: Nasopharyngeal Swab; Nasopharyngeal(NP) swabs in vial transport  medium  Result Value Ref Range Status   SARS Coronavirus 2 by RT PCR NEGATIVE NEGATIVE Final    Comment: (NOTE) SARS-CoV-2 target nucleic acids are NOT DETECTED.  The SARS-CoV-2 RNA is generally detectable in upper respiratory specimens during the acute phase of infection. The lowest concentration of SARS-CoV-2 viral copies this assay can detect is 138 copies/mL. A negative result does not preclude SARS-Cov-2 infection and should not be used as the sole basis for treatment or other patient management decisions. A negative result may occur with  improper specimen collection/handling, submission of specimen other than nasopharyngeal swab, presence of viral mutation(s) within the areas targeted by this assay, and inadequate number of viral copies(<138 copies/mL). A negative result must be combined with clinical observations, patient history, and epidemiological information. The expected result is Negative.  Fact Sheet for Patients:  EntrepreneurPulse.com.au  Fact Sheet for Healthcare Providers:  IncredibleEmployment.be  This test is no t yet approved or cleared by the [place] FDA and  has been authorized for detection and/or diagnosis of SARS-CoV-2 by FDA under an Emergency Use Authorization (EUA). This EUA will remain  in effect (meaning this test can be used) for the duration of the COVID-19 declaration under Section 564(b)(1) of the Act, 21 U.S.C.section 360bbb-3(b)(1), unless the authorization is terminated  or revoked sooner.       Influenza A by PCR NEGATIVE NEGATIVE Final   Influenza B by PCR NEGATIVE NEGATIVE Final    Comment: (NOTE) The Xpert Xpress SARS-CoV-2/FLU/RSV plus assay is intended as an aid in the diagnosis of influenza from Nasopharyngeal swab specimens and should not be used as a sole basis for treatment. Nasal washings and aspirates are unacceptable for Xpert Xpress SARS-CoV-2/FLU/RSV testing.  Fact Sheet for  Patients: EntrepreneurPulse.com.au  Fact Sheet for Healthcare Providers: IncredibleEmployment.be  This test is not yet approved or cleared by the [place] FDA and has been authorized for detection and/or diagnosis of SARS-CoV-2 by FDA  under an Emergency Use Authorization (EUA). This EUA will remain in effect (meaning this test can be used) for the duration of the COVID-19 declaration under Section 564(b)(1) of the Act, 21 U.S.C. section 360bbb-3(b)(1), unless the authorization is terminated or revoked.  Performed at [facility], [place] [address], [place], [place] [address]   Urine culture     Status: Abnormal   Collection Time: 12/30/20  6:35 PM   Specimen: Urine, Random  Result Value Ref Range Status   Specimen Description   Final    URINE, RANDOM Performed at [place] [address], [place], [place] [address]    Special Requests   Final    NONE Performed at [facility], [place] [address], [place], [place] [address]    Culture (A)  Final    <10,000 COLONIES/mL INSIGNIFICANT GROWTH Performed at [facility] [address], [place], [place] [address]    Report Status 01/01/2021 FINAL  Final  MRSA PCR Screening     Status: None   Collection Time: 12/30/20  7:45 PM   Specimen: Nasal Mucosa; Nasopharyngeal  Result Value Ref Range Status   MRSA by PCR NEGATIVE NEGATIVE Final    Comment:        The GeneXpert MRSA Assay (FDA approved for NASAL specimens only), is one component of a comprehensive MRSA colonization surveillance program. It is not intended to diagnose MRSA infection nor to guide or monitor treatment for MRSA infections. Performed at [facility], [place] [address] [place], [place] [address]      Labs: BNP (last 3 results) No results for input(s): BNP in the last 8760 hours. Basic Metabolic Panel: Recent Labs  Lab 12/30/20 1419  12/31/20 0501 01/01/21 0232  NA 137 140 140  K 3.9 3.8 4.0  CL 105 109 108  CO2 23 22 24   GLUCOSE 120* 100* 101*  BUN 32* 18 21  CREATININE 0.78 0.64 0.87  CALCIUM 9.5 9.0 8.9  MG  --  2.2  --   PHOS  --  3.6  --    Liver Function Tests: Recent Labs  Lab 12/30/20 1419 12/31/20 0501  AST 14* 16  ALT 15 15  ALKPHOS 81 69  BILITOT 1.1 1.8*  PROT 6.2* 5.5*  ALBUMIN 3.7 3.2*   No results for input(s): LIPASE, AMYLASE in the last 168 hours. No results for input(s): AMMONIA in the last 168 hours. CBC: Recent Labs  Lab 12/30/20 1419 12/31/20 0501 12/31/20 1730 01/01/21 0232  WBC 11.1* 6.8  --  5.6  NEUTROABS 9.2* 4.8  --   --   HGB 4.3* 6.8* 10.1* 8.4*  HCT 15.6* 23.6* 33.9* 27.9*  MCV 79.2* 84.0  --  81.6  PLT 269 200  --  213   Cardiac Enzymes: No results for input(s): CKTOTAL, CKMB, CKMBINDEX, TROPONINI in the last 168 hours. BNP: Invalid input(s): POCBNP CBG: No results for input(s): GLUCAP in the last 168 hours. D-Dimer No results for input(s): DDIMER in the last 72 hours. Hgb A1c No results for input(s): HGBA1C in the last 72 hours. Lipid Profile No results for input(s): CHOL, HDL, LDLCALC, TRIG, CHOLHDL, LDLDIRECT in the last 72 hours. Thyroid function studies No results for input(s): TSH, T4TOTAL, T3FREE, THYROIDAB in the last 72 hours.  Invalid input(s): FREET3 Anemia work up Recent Labs    12/30/20 1553  VITAMINB12 395  FOLATE 20.9  FERRITIN 3*  TIBC 577*  IRON 23*  RETICCTPCT  4.0*   Urinalysis    Component Value Date/Time   COLORURINE YELLOW 12/30/2020 [place] 12/30/2020 1835   LABSPEC 1.006 12/30/2020 1835   PHURINE 6.0 12/30/2020 1835   GLUCOSEU NEGATIVE 12/30/2020 1835   HGBUR NEGATIVE 12/30/2020 1835   BILIRUBINUR NEGATIVE 12/30/2020 1835   KETONESUR NEGATIVE 12/30/2020 1835   PROTEINUR NEGATIVE 12/30/2020 1835   UROBILINOGEN 0.2 06/16/2012 1025   NITRITE NEGATIVE 12/30/2020 1835   LEUKOCYTESUR TRACE (A)  12/30/2020 1835   Sepsis Labs Invalid input(s): PROCALCITONIN,  WBC,  LACTICIDVEN Microbiology Recent Results (from the past 240 hour(s))  Resp Panel by RT-PCR (Flu A&B, Covid) Nasopharyngeal Swab     Status: None   Collection Time: 12/30/20  2:45 AM   Specimen: Nasopharyngeal Swab; Nasopharyngeal(NP) swabs in vial transport medium  Result Value Ref Range Status   SARS Coronavirus 2 by RT PCR NEGATIVE NEGATIVE Final    Comment: (NOTE) SARS-CoV-2 target nucleic acids are NOT DETECTED.  The SARS-CoV-2 RNA is generally detectable in upper respiratory specimens during the acute phase of infection. The lowest concentration of SARS-CoV-2 viral copies this assay can detect is 138 copies/mL. A negative result does not preclude SARS-Cov-2 infection and should not be used as the sole basis for treatment or other patient management decisions. A negative result may occur with  improper specimen collection/handling, submission of specimen other than nasopharyngeal swab, presence of viral mutation(s) within the areas targeted by this assay, and inadequate number of viral copies(<138 copies/mL). A negative result must be combined with clinical observations, patient history, and epidemiological information. The expected result is Negative.  Fact Sheet for Patients:  EntrepreneurPulse.com.au  Fact Sheet for Healthcare Providers:  IncredibleEmployment.be  This test is no t yet approved or cleared by the [place] FDA and  has been authorized for detection and/or diagnosis of SARS-CoV-2 by FDA under an Emergency Use Authorization (EUA). This EUA will remain  in effect (meaning this test can be used) for the duration of the COVID-19 declaration under Section 564(b)(1) of the Act, 21 U.S.C.section 360bbb-3(b)(1), unless the authorization is terminated  or revoked sooner.       Influenza A by PCR NEGATIVE NEGATIVE Final   Influenza B by PCR NEGATIVE  NEGATIVE Final    Comment: (NOTE) The Xpert Xpress SARS-CoV-2/FLU/RSV plus assay is intended as an aid in the diagnosis of influenza from Nasopharyngeal swab specimens and should not be used as a sole basis for treatment. Nasal washings and aspirates are unacceptable for Xpert Xpress SARS-CoV-2/FLU/RSV testing.  Fact Sheet for Patients: EntrepreneurPulse.com.au  Fact Sheet for Healthcare Providers: IncredibleEmployment.be  This test is not yet approved or cleared by the [place] FDA and has been authorized for detection and/or diagnosis of SARS-CoV-2 by FDA under an Emergency Use Authorization (EUA). This EUA will remain in effect (meaning this test can be used) for the duration of the COVID-19 declaration under Section 564(b)(1) of the Act, 21 U.S.C. section 360bbb-3(b)(1), unless the authorization is terminated or revoked.  Performed at [facility], [place] [address] [place], [place] [address]   Urine culture     Status: Abnormal   Collection Time: 12/30/20  6:35 PM   Specimen: Urine, Random  Result Value Ref Range Status   Specimen Description   Final    URINE, RANDOM Performed at [place] [address] [place], [place] [address]    Special Requests   Final    NONE Performed at [facility],  [place] [address] [place], [place] [address]    Culture (A)  Final    <10,000 COLONIES/mL INSIGNIFICANT GROWTH Performed at [facility] [address], [place], [place] [address]    Report Status 01/01/2021 FINAL  Final  MRSA PCR Screening     Status: None   Collection Time: 12/30/20  7:45 PM   Specimen: Nasal Mucosa; Nasopharyngeal  Result Value Ref Range Status   MRSA by PCR NEGATIVE NEGATIVE Final    Comment:        The GeneXpert MRSA Assay (FDA approved for NASAL specimens only), is one component of a comprehensive MRSA colonization surveillance program. It is  not intended to diagnose MRSA infection nor to guide or monitor treatment for MRSA infections. Performed at [facility], [place] [address], [place], [place] [address]      Patient was seen and examined on the day of discharge and was found to be in stable condition. Time coordinating discharge: 35 minutes including assessment and coordination of care, as well as examination of the patient.   SIGNED:  Dessa Phi, DO [facility] 01/01/2021, 9:59 AM

## 2021-01-01 NOTE — Discharge Instructions (Signed)

## 2021-01-01 NOTE — Progress Notes (Addendum)
Patient ID: Molly Lucas, female   DOB: 30-Oct-1946, 75 y.o.   MRN: [id number]    Progress Note   Subjective   Day # 3  CC; profound anemia, Fe deficiency , melena Labs- HGB 10.1 last pm -8.4 this am  Status post 4 units packed RBCs since admission IV Feraheme to be given this a.m.  EGD-moderate/large hiatal hernia with associated Cameron erosions  Eating without difficulty, no further bowel movements or melena.  Was able to get up and ambulate today though notes that she does feel weak and unsteady on her own.   Objective   Vital signs in last 24 hours: Temp:  [97.4 F (36.3 C)-98.5 F (36.9 C)] 98.5 F (36.9 C) (02/23 0742) Pulse Rate:  [60-81] 66 (02/23 0400) Resp:  [10-68] 18 (02/23 0400) BP: (69-147)/(38-80) 121/80 (02/22 2000) SpO2:  [93 %-100 %] 93 % (02/23 0400) Weight:  [87.8 kg] 87.8 kg (02/23 0500) Last BM Date: 12/30/20 General:    Elderly white female in NAD Heart:  Regular rate and rhythm; no murmurs Lungs: Respirations even and unlabored, lungs CTA bilaterally Abdomen:  Soft, nontender and nondistended. Normal bowel sounds. Extremities:  Without edema. Neurologic:  Alert and oriented,  grossly normal neurologically. Psych:  Cooperative. Normal mood and affect.  Intake/Output from previous day: 02/22 0701 - 02/23 0700 In: 529.5 [Blood:529.5] Out: 1000 [Urine:1000] Intake/Output this shift: No intake/output data recorded.  Lab Results: Recent Labs    12/30/20 1419 12/31/20 0501 12/31/20 1730 01/01/21 0232  WBC 11.1* 6.8  --  5.6  HGB 4.3* 6.8* 10.1* 8.4*  HCT 15.6* 23.6* 33.9* 27.9*  PLT 269 200  --  213   BMET Recent Labs    12/30/20 1419 12/31/20 0501 01/01/21 0232  NA 137 140 140  K 3.9 3.8 4.0  CL 105 109 108  CO2 23 22 24   GLUCOSE 120* 100* 101*  BUN 32* 18 21  CREATININE 0.78 0.64 0.87  CALCIUM 9.5 9.0 8.9   LFT Recent Labs    12/31/20 0501  PROT 5.5*  ALBUMIN 3.2*  AST 16  ALT 15  ALKPHOS 69  BILITOT 1.8*    PT/INR No results for input(s): LABPROT, INR in the last 72 hours.      Assessment / Plan:    #29 75 year old female admitted with profound anemia, and complaint of melena x1 on admission. No evidence of active GI bleeding since admission. Patient found severely iron deficient which is a recurrent issue for her.  She has history of acute and subacute GI blood loss, and previously documented small bowel AVMs apparently noted on capsule endoscopy per Dr. Earlean Shawl 2020.  EGD yesterday with finding of large hiatal hernia with associated Cameron erosions, no active bleeding-though felt very likely the source of her recent drop in hemoglobin.  Hemoglobin stable after transfusions Receiving IV iron this morning  Plan; discharged on Protonix 40 mg p.o. every morning and plan chronic PPI therapy Patient advised to make follow-up appointment with Dr.Kale/hematology as will need close follow-up serial hemoglobins and may need further iron infusions. She has follow-up with her PCP later this week, and is also advised to make follow-up appointment with Dr. Earlean Shawl. GI will sign off, available if needed    LOS: 2 days   Amy Esterwood PA-C 01/01/2021, 8:42 AM  GI ATTENDING  Interval history data reviewed.  Patient seen and examined.  Agree with interval progress note.  Hemoglobin stable.  Appropriate increase in hemoglobin after 4 units of packed  red blood cells (4.3-8.4).  EGD demonstrated hiatal hernia with Molly Lucas erosions.  This is a common cause for chronic recurrent iron deficiency anemia.  The patient will have recurrent iron deficiency anemia if she does not have chronic iron replacement therapy.  Thus, agree with close monitoring moving forward with hematology (blood counts, ferritin levels, IV iron as needed).  She will resume her continuity GI care with her primary gastroenterologist, Dr. Earlean Shawl.  Thanks.  We will sign off.  Docia Chuck. Geri Seminole., M.D. [facility] Division of  Gastroenterology

## 2021-01-03 DIAGNOSIS — G4733 Obstructive sleep apnea (adult) (pediatric): Secondary | ICD-10-CM | POA: Diagnosis not present

## 2021-01-03 DIAGNOSIS — E669 Obesity, unspecified: Secondary | ICD-10-CM | POA: Diagnosis not present

## 2021-01-03 DIAGNOSIS — R110 Nausea: Secondary | ICD-10-CM | POA: Diagnosis not present

## 2021-01-03 DIAGNOSIS — R82998 Other abnormal findings in urine: Secondary | ICD-10-CM | POA: Diagnosis not present

## 2021-01-03 DIAGNOSIS — Z0000 Encounter for general adult medical examination without abnormal findings: Secondary | ICD-10-CM | POA: Diagnosis not present

## 2021-01-03 DIAGNOSIS — D509 Iron deficiency anemia, unspecified: Secondary | ICD-10-CM | POA: Diagnosis not present

## 2021-01-03 DIAGNOSIS — I10 Essential (primary) hypertension: Secondary | ICD-10-CM | POA: Diagnosis not present

## 2021-01-03 DIAGNOSIS — K219 Gastro-esophageal reflux disease without esophagitis: Secondary | ICD-10-CM | POA: Diagnosis not present

## 2021-01-03 DIAGNOSIS — E785 Hyperlipidemia, unspecified: Secondary | ICD-10-CM | POA: Diagnosis not present

## 2021-01-03 DIAGNOSIS — K449 Diaphragmatic hernia without obstruction or gangrene: Secondary | ICD-10-CM | POA: Diagnosis not present

## 2021-01-06 DIAGNOSIS — K921 Melena: Secondary | ICD-10-CM | POA: Diagnosis not present

## 2021-01-06 DIAGNOSIS — K219 Gastro-esophageal reflux disease without esophagitis: Secondary | ICD-10-CM | POA: Diagnosis not present

## 2021-01-06 DIAGNOSIS — F419 Anxiety disorder, unspecified: Secondary | ICD-10-CM | POA: Diagnosis not present

## 2021-01-06 DIAGNOSIS — D509 Iron deficiency anemia, unspecified: Secondary | ICD-10-CM | POA: Diagnosis not present

## 2021-01-06 DIAGNOSIS — K256 Chronic or unspecified gastric ulcer with both hemorrhage and perforation: Secondary | ICD-10-CM | POA: Diagnosis not present

## 2021-01-06 DIAGNOSIS — E785 Hyperlipidemia, unspecified: Secondary | ICD-10-CM | POA: Diagnosis not present

## 2021-01-06 DIAGNOSIS — G4733 Obstructive sleep apnea (adult) (pediatric): Secondary | ICD-10-CM | POA: Diagnosis not present

## 2021-01-06 DIAGNOSIS — D62 Acute posthemorrhagic anemia: Secondary | ICD-10-CM | POA: Diagnosis not present

## 2021-01-06 DIAGNOSIS — F32A Depression, unspecified: Secondary | ICD-10-CM | POA: Diagnosis not present

## 2021-01-06 DIAGNOSIS — M20092 Other deformity of left finger(s): Secondary | ICD-10-CM | POA: Diagnosis not present

## 2021-01-06 DIAGNOSIS — K440 Diaphragmatic hernia with obstruction, without gangrene: Secondary | ICD-10-CM | POA: Diagnosis not present

## 2021-01-06 DIAGNOSIS — Z96642 Presence of left artificial hip joint: Secondary | ICD-10-CM | POA: Diagnosis not present

## 2021-01-07 ENCOUNTER — Telehealth: Payer: Self-pay

## 2021-01-07 NOTE — Telephone Encounter (Signed)
Received message from [facility] that pt. states she had a bad episode of anemia and was in the hospital. She would like to speak with Dr. Irene Limbo about iron infusion therapy.   Dr. Irene Limbo will see pt. in 1-2 weeks. Scheduling message sent. Pt. notified that she would be contacted by scheduling with an appt. date and time and she was agreeable.

## 2021-01-08 ENCOUNTER — Telehealth: Payer: Self-pay | Admitting: Hematology

## 2021-01-08 NOTE — Telephone Encounter (Signed)
Scheduled appointments per 03/01 schedule message. Attempted to contact patient, left detailed message with appointment day and times.

## 2021-01-09 DIAGNOSIS — K254 Chronic or unspecified gastric ulcer with hemorrhage: Secondary | ICD-10-CM | POA: Diagnosis not present

## 2021-01-09 DIAGNOSIS — R0602 Shortness of breath: Secondary | ICD-10-CM | POA: Diagnosis not present

## 2021-01-09 DIAGNOSIS — D500 Iron deficiency anemia secondary to blood loss (chronic): Secondary | ICD-10-CM | POA: Diagnosis not present

## 2021-01-09 DIAGNOSIS — R5383 Other fatigue: Secondary | ICD-10-CM | POA: Diagnosis not present

## 2021-01-14 DIAGNOSIS — Z23 Encounter for immunization: Secondary | ICD-10-CM | POA: Diagnosis not present

## 2021-01-16 DIAGNOSIS — K921 Melena: Secondary | ICD-10-CM | POA: Diagnosis not present

## 2021-01-16 DIAGNOSIS — K256 Chronic or unspecified gastric ulcer with both hemorrhage and perforation: Secondary | ICD-10-CM | POA: Diagnosis not present

## 2021-01-16 DIAGNOSIS — K440 Diaphragmatic hernia with obstruction, without gangrene: Secondary | ICD-10-CM | POA: Diagnosis not present

## 2021-01-16 DIAGNOSIS — D509 Iron deficiency anemia, unspecified: Secondary | ICD-10-CM | POA: Diagnosis not present

## 2021-01-16 DIAGNOSIS — D500 Iron deficiency anemia secondary to blood loss (chronic): Secondary | ICD-10-CM | POA: Diagnosis not present

## 2021-01-16 DIAGNOSIS — E785 Hyperlipidemia, unspecified: Secondary | ICD-10-CM | POA: Diagnosis not present

## 2021-01-16 DIAGNOSIS — D62 Acute posthemorrhagic anemia: Secondary | ICD-10-CM | POA: Diagnosis not present

## 2021-01-22 ENCOUNTER — Other Ambulatory Visit (HOSPITAL_COMMUNITY): Payer: Medicare Other

## 2021-01-22 DIAGNOSIS — K440 Diaphragmatic hernia with obstruction, without gangrene: Secondary | ICD-10-CM | POA: Diagnosis not present

## 2021-01-22 DIAGNOSIS — E785 Hyperlipidemia, unspecified: Secondary | ICD-10-CM | POA: Diagnosis not present

## 2021-01-22 DIAGNOSIS — D509 Iron deficiency anemia, unspecified: Secondary | ICD-10-CM | POA: Diagnosis not present

## 2021-01-22 DIAGNOSIS — K256 Chronic or unspecified gastric ulcer with both hemorrhage and perforation: Secondary | ICD-10-CM | POA: Diagnosis not present

## 2021-01-22 DIAGNOSIS — D62 Acute posthemorrhagic anemia: Secondary | ICD-10-CM | POA: Diagnosis not present

## 2021-01-22 DIAGNOSIS — K921 Melena: Secondary | ICD-10-CM | POA: Diagnosis not present

## 2021-01-24 ENCOUNTER — Other Ambulatory Visit: Payer: Self-pay

## 2021-01-24 DIAGNOSIS — D500 Iron deficiency anemia secondary to blood loss (chronic): Secondary | ICD-10-CM

## 2021-01-27 NOTE — Progress Notes (Signed)
HEMATOLOGY/ONCOLOGY CLINIC NOTE  Date of Service: 01/28/2021  Patient Care Team: [name], MD as PCP - General (Internal Medicine)  CHIEF COMPLAINTS/PURPOSE OF CONSULTATION:   F/u for iron deficiency Anemia   HISTORY OF PRESENTING ILLNESS:   Molly Lucas is a wonderful 75 y.o. female who has been referred to [place] by her PCP Dr. Bevelyn Buckles for evaluation and management of weight loss with elevated Hemoglobin and Hematocrit and hypercalcemia. She presents to the clinic today accompanied by her son.   She notes she has lost weight recently but this was due to her trouble eating. She has seen GI Dr. Thana Farr and had a endoscopy last week which showed a hiatal hernia which made it hard to swallow. A biopsy was done as well. She was given Prilosec and her eating has recently improved. She has had 3 meals in the past few days. For the past few months she had no appetite. She has lost 15 pounds over the last 2 months.  She notes she is prone to being anemic. She has had anemia episodes 3 times in the past few years. First time in 2014 she was treated with IV Feraheme. Another time in 11/2015 she had a ulcer with GI bleeding and her Hg went down to 4 and she was given blood transfusion. In 10/2017 she her Hg went down to again and was again treated with IV Feraheme. Her last colonoscopy was 2 years ago and was clear from polyps or concerns. She notes having black stool only once since her ulcer due to Pepto bismol. She notes she has not been on oral calcium for 2-3 months. She denies back pain, bone pain or kidney stones.   Today she notes family stress. She has had a mammogram in the last 2 years. She now lives in a retirement community.  She notes she had fluid around her heart once and went to see cardiologist who cleared her.  On review of symptoms, pt notes tightness in chest resolved with Prilosec. She denies back pain or bone pain. She has decreased appetite still. She notes  since not eating much she has felt feverish and moderately sweaty. This has not occurred in the past few days. She notes her bowel movements are random because she is not currently eating much. She is ale to pass urine fine. She denies cough, chest pain or difficulty breathing. She notes stress fracture b/w L3 and L4 which has arthritis around it.    INTERVAL HISTORY:  Molly Lucas is a 75 y.o. female here for evaluation and management of anemia. The patient's last visit with [place] was on 04/23/2020. The pt reports that she is doing well overall.  The pt reports that she was hospitalized with a Hgb of 4.3 last month. The pt notes that she was recently visiting her son and two grandchildren below 74 years old. She notes that she was overactive and believes she overdid it. The pt waited to go to the ED until she talked to Dr. Earlean Shawl and was instructed to go to the ED. The pt notes that she told to follow up here by both her PCP and GI.   The pt notes that she is still recovering and making sure she is eating and drinking well. The pt notes that she still feels lightheadedness and dizziness as aftermaths of the incident. The pt notes that Dr. Earlean Shawl has not mentioned surgery to the pt at this time.  The pt denies being  on any blood thinners or any OTC NSAIDs. The pt notes that she was started on Protonix, but decided to stop this by herself. The pt notes that she also gets weekly PT. The pt also notes she keeps falling asleep during the day, in addition to her sleeping well at night.  Lab results today 01/28/2021 of CBC w/diff and CMP is as follows: all values are WNL except for RDW of 21.7. CMP pending. 01/28/2021 Iron saturation 9% 01/28/2021 29  On review of systems, pt reports improving dizziness/lightheadedness, venous stasis, increased sleep and denies abdominal pain, back pain and any other symptoms.  MEDICAL HISTORY:  Past Medical History:  Diagnosis Date  . Anemia    Takes iron  supplement  . Anxiety   . Arthritis   . Chronic pain of left thumb   . Constipation   . 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 Depression   . Fibroid, uterine   . GERD (gastroesophageal reflux disease)   . History of blood transfusion   . Hx of seasonal allergies   . Hyperlipemia   . Hypertension    hx of off medication now.  . OA (osteoarthritis) of hip    left  . Sleep apnea    dentist made mouthipiece     SURGICAL HISTORY: Past Surgical History:  Procedure Laterality Date  . BIOPSY  03/30/2019   Procedure: BIOPSY;  Surgeon: Thornton Park, MD;  Location: [facility];  Service: Gastroenterology;;  . [place]  08/2015   with abductor pollicis longus tendon transfer and suspensionplasty  . COLONOSCOPY WITH PROPOFOL N/A 03/31/2019   Procedure: COLONOSCOPY WITH PROPOFOL;  Surgeon: Thornton Park, MD;  Location: [facility];  Service: Gastroenterology;  Laterality: N/A;  . DE QUERVAIN'S RELEASE  08/2015  . DILATATION & CURRETTAGE/HYSTEROSCOPY WITH RESECTOCOPE  2007  . ESOPHAGOGASTRODUODENOSCOPY N/A 11/30/2015   Procedure: ESOPHAGOGASTRODUODENOSCOPY (EGD);  Surgeon: Richmond Campbell, MD;  Location: [facility];  Service: Endoscopy;  Laterality: N/A;  . ESOPHAGOGASTRODUODENOSCOPY (EGD) WITH PROPOFOL N/A 03/30/2019   Procedure: ESOPHAGOGASTRODUODENOSCOPY (EGD) WITH PROPOFOL;  Surgeon: Thornton Park, MD;  Location: [facility];  Service: Gastroenterology;  Laterality: N/A;  . ESOPHAGOGASTRODUODENOSCOPY (EGD) WITH PROPOFOL N/A 12/31/2020   Procedure: ESOPHAGOGASTRODUODENOSCOPY (EGD) WITH PROPOFOL;  Surgeon: Irene Shipper, MD;  Location: [facility];  Service: Endoscopy;  Laterality: N/A;  . HERNIA REPAIR Left 06/2012  . INGUINAL HERNIA REPAIR Right 05/03/2013   Procedure: HERNIA REPAIR INGUINAL ADULT;  Surgeon: Harl Bowie, MD;  Location: [facility];  Service: General;  Laterality: Right;  . INGUINAL HERNIA  REPAIR Right 08/23/2013   Procedure: LAPAROSCOPIC INGUINAL HERNIA;  Surgeon: Harl Bowie, MD;  Location: [facility];  Service: General;  Laterality: Right;  . INSERTION OF MESH Right 05/03/2013   Procedure: INSERTION OF MESH;  Surgeon: Harl Bowie, MD;  Location: [place];  Service: General;  Laterality: Right;  . INSERTION OF MESH Right 08/23/2013   Procedure: INSERTION OF MESH;  Surgeon: Harl Bowie, MD;  Location: [facility];  Service: General;  Laterality: Right;  . METACARPOPHALANGEAL JOINT ARTHRODESIS  08/2015   with local bone graft  . TONSILLECTOMY    . TOTAL HIP ARTHROPLASTY  06/24/2012   Procedure: TOTAL HIP ARTHROPLASTY ANTERIOR APPROACH;  Surgeon: Mcarthur Rossetti, MD;  Location: [facility];  Service: Orthopedics;  Laterality: Left;  Left total hip arthroplasty, anterior approach  . UTERINE FIBROID SURGERY  2007  . WISDOM TOOTH EXTRACTION  SOCIAL HISTORY: Social History   Socioeconomic History  . Marital status: Divorced    Spouse name: Not on file  . Number of children: Not on file  . Years of education: Not on file  . Highest education level: Not on file  Occupational History  . Not on file  Tobacco Use  . Smoking status: Never Smoker  . Smokeless tobacco: Never Used  Vaping Use  . Vaping Use: Never used  Substance and Sexual Activity  . Alcohol use: Yes    Alcohol/week: 2.0 standard drinks    Types: 2 Glasses of wine per week    Comment: occasional glass of wine   . Drug use: No  . Sexual activity: Not on file  Other Topics Concern  . Not on file  Social History Narrative  . Not on file   Social Determinants of Health   Financial Resource Strain: Not on file  Food Insecurity: Not on file  Transportation Needs: Not on file  Physical Activity: Not on file  Stress: Not on file  Social Connections: Not on file  Intimate Partner Violence: Not on file    FAMILY HISTORY: Family History  Problem Relation Age of Onset  . Heart failure Mother    . Osteoporosis Mother   . Arthritis Mother   . CAD Maternal Grandfather   . Heart failure Maternal Grandfather   . Dementia Maternal Grandfather   . CAD Paternal Grandmother   . CAD Paternal Grandfather   . Arthritis Sister     ALLERGIES:  is allergic to penicillins.  MEDICATIONS:  Current Outpatient Medications  Medication Sig Dispense Refill  . CALCIUM PO Take 2 tablets by mouth daily.     . cetirizine (ZYRTEC) 10 MG tablet Take 10 mg by mouth daily.    . Cholecalciferol (VITAMIN D3) 250 MCG (10000 UT) TABS Take 10,000 Units by mouth daily.     . CYANOCOBALAMIN PO Take 1 tablet by mouth daily.    . Doxepin HCl 6 MG TABS Take 6 mg by mouth at bedtime.     . Multiple Vitamins-Minerals (HAIR SKIN AND NAILS FORMULA PO) Take 1 tablet by mouth daily.    . multivitamin-lutein (OCUVITE-LUTEIN) CAPS Take 1 capsule by mouth daily.    . NON FORMULARY Take 1 capsule by mouth See admin instructions. Black [place] oil capsules: Take 1 capsule by mouth once a day    . Omega-3 1000 MG CAPS Take 2,000 mg by mouth daily.     . pantoprazole (PROTONIX) 40 MG tablet Take 1 tablet (40 mg total) by mouth 2 (two) times daily before a meal. 60 tablet 2  . pravastatin (PRAVACHOL) 20 MG tablet Take 20 mg by mouth at bedtime.     . Turmeric 500 MG CAPS Take 500 mg by mouth daily. Tumeric with cucumin    . VIIBRYD 40 MG TABS Take 40 mg by mouth daily.    . vitamin C (ASCORBIC ACID) 500 MG tablet Take 500 mg by mouth daily.     No current facility-administered medications for this visit.    REVIEW OF SYSTEMS: 10 Point review of Systems was done is negative except as noted above.  PHYSICAL EXAMINATION: ECOG FS:2 - Symptomatic, <50% confined to bed  Vitals:   01/28/21 1454  BP: (!) 145/89  Pulse: 88  Resp: 15  Temp: 97.7 F (36.5 C)  SpO2: 95%   Wt Readings from Last 3 Encounters:  01/28/21 193 lb 9.6 oz (87.8 kg)  01/01/21 193  lb 9 oz (87.8 kg)  12/26/20 195 lb 9.6 oz (88.7 kg)   Body mass  index is 35.41 kg/m.    GENERAL:alert, in no acute distress and comfortable SKIN: no acute rashes, no significant lesions EYES: conjunctiva are pink and non-injected, sclera anicteric OROPHARYNX: MMM, no exudates, no oropharyngeal erythema or ulceration NECK: supple, no JVD LYMPH:  no palpable lymphadenopathy in the cervical, axillary or inguinal regions LUNGS: clear to auscultation b/l with normal respiratory effort HEART: regular rate & rhythm ABDOMEN:  normoactive bowel sounds , non tender, not distended. Extremity: no pedal edema. Minimal leg swelling, symmetric. PSYCH: alert & oriented x 3 with fluent speech NEURO: no focal motor/sensory deficits  LABORATORY DATA:  I have reviewed the data as listed  . CBC Latest Ref Rng & Units 01/28/2021 01/01/2021 12/31/2020  WBC 4.0 - 10.5 K/uL 6.2 5.6 -  Hemoglobin 12.0 - 15.0 g/dL 12.3 8.4(L) 10.1(L)  Hematocrit 36.0 - 46.0 % 40.9 27.9(L) 33.9(L)  Platelets 150 - 400 K/uL 199 213 -    . CMP Latest Ref Rng & Units 01/28/2021 01/01/2021 12/31/2020  Glucose 70 - 99 mg/dL 89 101(H) 100(H)  BUN 8 - 23 mg/dL 21 21 18   Creatinine 0.44 - 1.00 mg/dL 0.69 0.87 0.64  Sodium 135 - 145 mmol/L 141 140 140  Potassium 3.5 - 5.1 mmol/L 4.1 4.0 3.8  Chloride 98 - 111 mmol/L 107 108 109  CO2 22 - 32 mmol/L 26 24 22   Calcium 8.9 - 10.3 mg/dL 10.6(H) 8.9 9.0  Total Protein 6.5 - 8.1 g/dL 7.1 - 5.5(L)  Total Bilirubin 0.3 - 1.2 mg/dL 1.3(H) - 1.8(H)  Alkaline Phos 38 - 126 U/L 103 - 69  AST 15 - 41 U/L 25 - 16  ALT 0 - 44 U/L 22 - 15   . Lab Results  Component Value Date   IRON 42 01/28/2021   TIBC 489 (H) 01/28/2021   IRONPCTSAT 9 (L) 01/28/2021   (Iron and TIBC)  Lab Results  Component Value Date   FERRITIN 29 01/28/2021   Component     Latest Ref Rng & Units 02/01/2018  IgG (Immunoglobin G), Serum     700 - 1,600 mg/dL 864  IgA     64 - 422 mg/dL 194  IgM (Immunoglobulin M), Srm     26 - 217 mg/dL 71  Total Protein ELP     6.0 - 8.5  g/dL 6.8  Albumin SerPl Elph-Mcnc     2.9 - 4.4 g/dL 3.8  Alpha 1     0.0 - 0.4 g/dL 0.3  Alpha2 Glob SerPl Elph-Mcnc     0.4 - 1.0 g/dL 0.7  B-Globulin SerPl Elph-Mcnc     0.7 - 1.3 g/dL 1.1  Gamma Glob SerPl Elph-Mcnc     0.4 - 1.8 g/dL 0.9  M Protein SerPl Elph-Mcnc     Not Observed g/dL Not Observed  Globulin, Total     2.2 - 3.9 g/dL 3.0  Albumin/Glob SerPl     0.7 - 1.7 1.3  IFE 1      Comment  Please Note (HCV):      Comment  PTH, Intact     15 - 65 pg/mL 56  Calcium, Total (PTH)     8.7 - 10.3 mg/dL 10.9 (H)  PTH Interp      Comment  Kappa free light chain     3.3 - 19.4 mg/L 23.4 (H)  Lamda free light chains     5.7 -  26.3 mg/L 10.3  Kappa, lamda light chain ratio     0.26 - 1.65 2.27 (H)  Beta-2 Microglobulin     0.6 - 2.4 mg/L 2.9 (H)  Sed Rate     0 - 22 mm/hr 2  LDH     125 - 245 U/L 169    RADIOGRAPHIC STUDIES: I have personally reviewed the radiological images as listed and agreed with the findings in the report. No results found.  ASSESSMENT & PLAN:   Molly Lucas is a 75 y.o. Caucasian female with  1. Severe iron deficiency Anemia due to GI bleeding  2. Hiatal Hernia with h/o cameron ulcers with previous recurrent Iron deficiency needing IV iron replacement in the past. -Found on GI workup and likely cause of GI bleeding leading to anemia.   3. Recurrent Anemia - secondary to GI blood loss -in 2014 treated with IV Feraheme, 2017 after GI ulcer and was treated with Blood transfusion, 11/2016 treated with IV Feraheme.   . Lab Results  Component Value Date   IRON 42 01/28/2021   TIBC 489 (H) 01/28/2021   IRONPCTSAT 9 (L) 01/28/2021   (Iron and TIBC)  Lab Results  Component Value Date   FERRITIN 29 01/28/2021   4. Weight loss - 15 pounds over 2 months -secondary to GI issues  -Improved eating with Prilosec 40mg  once a day  5. H/o Elevated Calcium at 11.7 rpt today improved to 11 PTH - WNL Myeloma panel - no M spike and neg  IFE K/L ratio minimally abnormal but only minimal increase in Kappa light chains B2MG  -borderline elevated.   PLAN:  -Discussed pt labwork today, 01/28/2021; blood counts back normal. Other labs pending. -Advised pt we will have to go back to our original plan of iron supplementation and close monitoring to aggressively replace iron levels.  -Advised pt that IV iron does not help fix acute bleeding and that would need more workup by GI to control the bleeding. -Hgb of 12.3 today. No need for transfusion at this time. -Will tentatively set up monthly IV Iron infusions. -Advised pt that Protonix is used to reduce risk of bleeding from ulcers. Recommended pt f/u w GI regarding final decision on approval for stopping this by herself. -Recommended pt wear sports compression socks (knee-high) and elevate legs while sitting. -Discussed Sandostatin again. Advised pt that GI would need to manage this and discuss with the insurance for approval. -Will get IV Injectafer in 1-2 weeks. Will get IV Iron qmonthly with labs. -Will see back in 3 months with labs.   FOLLOW UP: Plz schedule monthly Injectafer IV iron infusion x 4  starting in 1-2 weeks Labs with each infusion MD visit in 3 months   The total time spent in the appointment was 30 minutes and more than 50% was on counseling and direct patient cares.   All of the patient's questions were answered with apparent satisfaction. The patient knows to call the clinic with any problems, questions or concerns.    Sullivan Lone MD [place] AAHIVMS [facility] Hematology/Oncology Physician [facility]  (Office):       [phone] (Work cell):  [phone] (Fax):           [phone]  01/28/2021 3:18 PM  I, Reinaldo Raddle, am acting as scribe for Dr. Sullivan Lone, MD.   .I have reviewed the above documentation for accuracy and completeness, and I agree with the above. Brunetta Genera MD

## 2021-01-28 ENCOUNTER — Inpatient Hospital Stay: Payer: Medicare Other | Attending: Hematology

## 2021-01-28 ENCOUNTER — Inpatient Hospital Stay (HOSPITAL_BASED_OUTPATIENT_CLINIC_OR_DEPARTMENT_OTHER): Payer: Medicare Other | Admitting: Hematology

## 2021-01-28 ENCOUNTER — Other Ambulatory Visit: Payer: Self-pay

## 2021-01-28 VITALS — BP 145/89 | HR 88 | Temp 97.7°F | Resp 15 | Ht 62.0 in | Wt 193.6 lb

## 2021-01-28 DIAGNOSIS — K922 Gastrointestinal hemorrhage, unspecified: Secondary | ICD-10-CM | POA: Diagnosis not present

## 2021-01-28 DIAGNOSIS — K254 Chronic or unspecified gastric ulcer with hemorrhage: Secondary | ICD-10-CM

## 2021-01-28 DIAGNOSIS — R634 Abnormal weight loss: Secondary | ICD-10-CM | POA: Insufficient documentation

## 2021-01-28 DIAGNOSIS — D500 Iron deficiency anemia secondary to blood loss (chronic): Secondary | ICD-10-CM | POA: Diagnosis not present

## 2021-01-28 LAB — CBC WITH DIFFERENTIAL (CANCER CENTER ONLY)
Abs Immature Granulocytes: 0.01 10*3/uL (ref 0.00–0.07)
Basophils Absolute: 0.1 10*3/uL (ref 0.0–0.1)
Basophils Relative: 1 %
Eosinophils Absolute: 0.3 10*3/uL (ref 0.0–0.5)
Eosinophils Relative: 4 %
HCT: 40.9 % (ref 36.0–46.0)
Hemoglobin: 12.3 g/dL (ref 12.0–15.0)
Immature Granulocytes: 0 %
Lymphocytes Relative: 20 %
Lymphs Abs: 1.2 10*3/uL (ref 0.7–4.0)
MCH: 26.2 pg (ref 26.0–34.0)
MCHC: 30.1 g/dL (ref 30.0–36.0)
MCV: 87 fL (ref 80.0–100.0)
Monocytes Absolute: 0.6 10*3/uL (ref 0.1–1.0)
Monocytes Relative: 10 %
Neutro Abs: 4.1 10*3/uL (ref 1.7–7.7)
Neutrophils Relative %: 65 %
Platelet Count: 199 10*3/uL (ref 150–400)
RBC: 4.7 MIL/uL (ref 3.87–5.11)
RDW: 21.7 % — ABNORMAL HIGH (ref 11.5–15.5)
WBC Count: 6.2 10*3/uL (ref 4.0–10.5)
nRBC: 0 % (ref 0.0–0.2)

## 2021-01-28 LAB — CMP (CANCER CENTER ONLY)
ALT: 22 U/L (ref 0–44)
AST: 25 U/L (ref 15–41)
Albumin: 3.9 g/dL (ref 3.5–5.0)
Alkaline Phosphatase: 103 U/L (ref 38–126)
Anion gap: 8 (ref 5–15)
BUN: 21 mg/dL (ref 8–23)
CO2: 26 mmol/L (ref 22–32)
Calcium: 10.6 mg/dL — ABNORMAL HIGH (ref 8.9–10.3)
Chloride: 107 mmol/L (ref 98–111)
Creatinine: 0.69 mg/dL (ref 0.44–1.00)
GFR, Estimated: >60 mL/min (ref >60)
Glucose, Bld: 89 mg/dL (ref 70–99)
Potassium: 4.1 mmol/L (ref 3.5–5.1)
Sodium: 141 mmol/L (ref 135–145)
Total Bilirubin: 1.3 mg/dL — ABNORMAL HIGH (ref 0.3–1.2)
Total Protein: 7.1 g/dL (ref 6.5–8.1)

## 2021-01-29 ENCOUNTER — Telehealth: Payer: Self-pay | Admitting: Hematology

## 2021-01-29 LAB — FERRITIN: Ferritin: 29 ng/mL (ref 11–307)

## 2021-01-29 LAB — IRON AND TIBC
Iron: 42 ug/dL (ref 41–142)
Saturation Ratios: 9 % — ABNORMAL LOW (ref 21–57)
TIBC: 489 ug/dL — ABNORMAL HIGH (ref 236–444)
UIBC: 447 ug/dL — ABNORMAL HIGH (ref 120–384)

## 2021-01-29 NOTE — Telephone Encounter (Signed)
Left message with follow-up appointments per 3/22 los. Gave option to call back to reschedule if needed.

## 2021-02-03 ENCOUNTER — Other Ambulatory Visit (HOSPITAL_COMMUNITY): Payer: Medicare Other

## 2021-02-03 ENCOUNTER — Encounter (HOSPITAL_COMMUNITY): Payer: Self-pay | Admitting: Cardiology

## 2021-02-03 DIAGNOSIS — K256 Chronic or unspecified gastric ulcer with both hemorrhage and perforation: Secondary | ICD-10-CM | POA: Diagnosis not present

## 2021-02-03 DIAGNOSIS — D509 Iron deficiency anemia, unspecified: Secondary | ICD-10-CM | POA: Diagnosis not present

## 2021-02-03 DIAGNOSIS — D62 Acute posthemorrhagic anemia: Secondary | ICD-10-CM | POA: Diagnosis not present

## 2021-02-03 DIAGNOSIS — E785 Hyperlipidemia, unspecified: Secondary | ICD-10-CM | POA: Diagnosis not present

## 2021-02-03 DIAGNOSIS — K440 Diaphragmatic hernia with obstruction, without gangrene: Secondary | ICD-10-CM | POA: Diagnosis not present

## 2021-02-03 DIAGNOSIS — K921 Melena: Secondary | ICD-10-CM | POA: Diagnosis not present

## 2021-02-05 DIAGNOSIS — D62 Acute posthemorrhagic anemia: Secondary | ICD-10-CM | POA: Diagnosis not present

## 2021-02-05 DIAGNOSIS — M20092 Other deformity of left finger(s): Secondary | ICD-10-CM | POA: Diagnosis not present

## 2021-02-05 DIAGNOSIS — G4733 Obstructive sleep apnea (adult) (pediatric): Secondary | ICD-10-CM | POA: Diagnosis not present

## 2021-02-05 DIAGNOSIS — K440 Diaphragmatic hernia with obstruction, without gangrene: Secondary | ICD-10-CM | POA: Diagnosis not present

## 2021-02-05 DIAGNOSIS — K219 Gastro-esophageal reflux disease without esophagitis: Secondary | ICD-10-CM | POA: Diagnosis not present

## 2021-02-05 DIAGNOSIS — F419 Anxiety disorder, unspecified: Secondary | ICD-10-CM | POA: Diagnosis not present

## 2021-02-05 DIAGNOSIS — Z96642 Presence of left artificial hip joint: Secondary | ICD-10-CM | POA: Diagnosis not present

## 2021-02-05 DIAGNOSIS — E785 Hyperlipidemia, unspecified: Secondary | ICD-10-CM | POA: Diagnosis not present

## 2021-02-05 DIAGNOSIS — F32A Depression, unspecified: Secondary | ICD-10-CM | POA: Diagnosis not present

## 2021-02-05 DIAGNOSIS — D509 Iron deficiency anemia, unspecified: Secondary | ICD-10-CM | POA: Diagnosis not present

## 2021-02-05 DIAGNOSIS — K921 Melena: Secondary | ICD-10-CM | POA: Diagnosis not present

## 2021-02-05 DIAGNOSIS — K256 Chronic or unspecified gastric ulcer with both hemorrhage and perforation: Secondary | ICD-10-CM | POA: Diagnosis not present

## 2021-02-07 ENCOUNTER — Other Ambulatory Visit: Payer: Self-pay

## 2021-02-07 DIAGNOSIS — G4733 Obstructive sleep apnea (adult) (pediatric): Secondary | ICD-10-CM | POA: Diagnosis not present

## 2021-02-07 DIAGNOSIS — D649 Anemia, unspecified: Secondary | ICD-10-CM | POA: Diagnosis not present

## 2021-02-07 DIAGNOSIS — R6889 Other general symptoms and signs: Secondary | ICD-10-CM | POA: Diagnosis not present

## 2021-02-07 DIAGNOSIS — D500 Iron deficiency anemia secondary to blood loss (chronic): Secondary | ICD-10-CM

## 2021-02-07 DIAGNOSIS — K219 Gastro-esophageal reflux disease without esophagitis: Secondary | ICD-10-CM | POA: Diagnosis not present

## 2021-02-10 DIAGNOSIS — K921 Melena: Secondary | ICD-10-CM | POA: Diagnosis not present

## 2021-02-10 DIAGNOSIS — D509 Iron deficiency anemia, unspecified: Secondary | ICD-10-CM | POA: Diagnosis not present

## 2021-02-10 DIAGNOSIS — K256 Chronic or unspecified gastric ulcer with both hemorrhage and perforation: Secondary | ICD-10-CM | POA: Diagnosis not present

## 2021-02-10 DIAGNOSIS — E785 Hyperlipidemia, unspecified: Secondary | ICD-10-CM | POA: Diagnosis not present

## 2021-02-10 DIAGNOSIS — D62 Acute posthemorrhagic anemia: Secondary | ICD-10-CM | POA: Diagnosis not present

## 2021-02-10 DIAGNOSIS — K440 Diaphragmatic hernia with obstruction, without gangrene: Secondary | ICD-10-CM | POA: Diagnosis not present

## 2021-02-11 ENCOUNTER — Inpatient Hospital Stay: Payer: Medicare Other | Attending: Hematology

## 2021-02-11 ENCOUNTER — Inpatient Hospital Stay: Payer: Medicare Other

## 2021-02-11 ENCOUNTER — Other Ambulatory Visit: Payer: Self-pay

## 2021-02-11 VITALS — BP 150/83 | HR 86 | Temp 98.4°F | Resp 18 | Wt 193.0 lb

## 2021-02-11 DIAGNOSIS — D500 Iron deficiency anemia secondary to blood loss (chronic): Secondary | ICD-10-CM

## 2021-02-11 DIAGNOSIS — K254 Chronic or unspecified gastric ulcer with hemorrhage: Secondary | ICD-10-CM | POA: Insufficient documentation

## 2021-02-11 LAB — CBC WITH DIFFERENTIAL (CANCER CENTER ONLY)
Abs Immature Granulocytes: 0.02 10*3/uL (ref 0.00–0.07)
Basophils Absolute: 0.1 10*3/uL (ref 0.0–0.1)
Basophils Relative: 1 %
Eosinophils Absolute: 0.3 10*3/uL (ref 0.0–0.5)
Eosinophils Relative: 5 %
HCT: 39 % (ref 36.0–46.0)
Hemoglobin: 12.2 g/dL (ref 12.0–15.0)
Immature Granulocytes: 0 %
Lymphocytes Relative: 25 %
Lymphs Abs: 1.6 10*3/uL (ref 0.7–4.0)
MCH: 26.2 pg (ref 26.0–34.0)
MCHC: 31.3 g/dL (ref 30.0–36.0)
MCV: 83.9 fL (ref 80.0–100.0)
Monocytes Absolute: 0.6 10*3/uL (ref 0.1–1.0)
Monocytes Relative: 10 %
Neutro Abs: 3.7 10*3/uL (ref 1.7–7.7)
Neutrophils Relative %: 59 %
Platelet Count: 244 10*3/uL (ref 150–400)
RBC: 4.65 MIL/uL (ref 3.87–5.11)
RDW: 19.9 % — ABNORMAL HIGH (ref 11.5–15.5)
WBC Count: 6.3 10*3/uL (ref 4.0–10.5)
nRBC: 0 % (ref 0.0–0.2)

## 2021-02-11 LAB — CMP (CANCER CENTER ONLY)
ALT: 21 U/L (ref 0–44)
AST: 17 U/L (ref 15–41)
Albumin: 4 g/dL (ref 3.5–5.0)
Alkaline Phosphatase: 114 U/L (ref 38–126)
Anion gap: 11 (ref 5–15)
BUN: 23 mg/dL (ref 8–23)
CO2: 24 mmol/L (ref 22–32)
Calcium: 10.2 mg/dL (ref 8.9–10.3)
Chloride: 106 mmol/L (ref 98–111)
Creatinine: 0.82 mg/dL (ref 0.44–1.00)
GFR, Estimated: >60 mL/min (ref >60)
Glucose, Bld: 109 mg/dL — ABNORMAL HIGH (ref 70–99)
Potassium: 4.4 mmol/L (ref 3.5–5.1)
Sodium: 141 mmol/L (ref 135–145)
Total Bilirubin: 0.9 mg/dL (ref 0.3–1.2)
Total Protein: 7.1 g/dL (ref 6.5–8.1)

## 2021-02-11 MED ORDER — SODIUM CHLORIDE 0.9 % IV SOLN
750.0000 mg | Freq: Once | INTRAVENOUS | Status: AC
  Administered 2021-02-11: 750 mg via INTRAVENOUS
  Filled 2021-02-11: qty 15

## 2021-02-11 NOTE — Progress Notes (Signed)
Patient did not wait 30 minute post observation VSS upon leaving

## 2021-02-11 NOTE — Patient Instructions (Signed)
Ferric carboxymaltose injection What is this medicine? FERRIC CARBOXYMALTOSE (ferr-ik car-box-ee-mol-toes) is an iron complex. Iron is used to make healthy red blood cells, which carry oxygen and nutrients throughout the body. This medicine is used to treat anemia in people with chronic kidney disease or people who cannot take iron by mouth. This medicine may be used for other purposes; ask your health care provider or pharmacist if you have questions. COMMON BRAND NAME(S): Injectafer What should I tell my health care provider before I take this medicine? They need to know if you have any of these conditions:  high levels of iron in the blood  liver disease  an unusual or allergic reaction to iron, other medicines, foods, dyes, or preservatives  pregnant or trying to get pregnant  breast-feeding How should I use this medicine? This medicine is for infusion into a vein. It is given by a health care professional in a hospital or clinic setting. Talk to your pediatrician regarding the use of this medicine in children. Special care may be needed. Overdosage: If you think you have taken too much of this medicine contact a poison control center or emergency room at once. NOTE: This medicine is only for you. Do not share this medicine with others. What if I miss a dose? Keep appointments for follow-up doses. It is important not to miss your dose. Call your care team if you are unable to keep an appointment. What may interact with this medicine? Do not take this medicine with any of the following medications:  deferoxamine  dimercaprol  other iron products This list may not describe all possible interactions. Give your health care provider a list of all the medicines, herbs, non-prescription drugs, or dietary supplements you use. Also tell them if you smoke, drink alcohol, or use illegal drugs. Some items may interact with your medicine. What should I watch for while using this  medicine? Visit your doctor or health care professional regularly. Tell your doctor if your symptoms do not start to get better or if they get worse. You may need blood work done while you are taking this medicine. You may need to follow a special diet. Talk to your doctor. Foods that contain iron include: whole grains/cereals, dried fruits, beans, or peas, leafy green vegetables, and organ meats (liver, kidney). What side effects may I notice from receiving this medicine? Side effects that you should report to your doctor or health care professional as soon as possible:  allergic reactions like skin rash, itching or hives, swelling of the face, lips, or tongue  dizziness  facial flushing Side effects that usually do not require medical attention (report to your doctor or health care professional if they continue or are bothersome):  changes in taste  constipation  headache  nausea, vomiting  pain, redness, or irritation at site where injected This list may not describe all possible side effects. Call your doctor for medical advice about side effects. You may report side effects to FDA at 1-800-FDA-1088. Where should I keep my medicine? This drug is given in a hospital or clinic and will not be stored at home. NOTE: This sheet is a summary. It may not cover all possible information. If you have questions about this medicine, talk to your doctor, pharmacist, or health care provider.  2021 Elsevier/Gold Standard (2020-10-08 14:00:47)  

## 2021-02-12 DIAGNOSIS — F331 Major depressive disorder, recurrent, moderate: Secondary | ICD-10-CM | POA: Diagnosis not present

## 2021-02-12 LAB — IRON AND TIBC
Iron: 31 ug/dL — ABNORMAL LOW (ref 41–142)
Saturation Ratios: 6 % — ABNORMAL LOW (ref 21–57)
TIBC: 513 ug/dL — ABNORMAL HIGH (ref 236–444)
UIBC: 481 ug/dL — ABNORMAL HIGH (ref 120–384)

## 2021-02-12 LAB — FERRITIN: Ferritin: 14 ng/mL (ref 11–307)

## 2021-02-14 DIAGNOSIS — D509 Iron deficiency anemia, unspecified: Secondary | ICD-10-CM | POA: Diagnosis not present

## 2021-02-14 DIAGNOSIS — K256 Chronic or unspecified gastric ulcer with both hemorrhage and perforation: Secondary | ICD-10-CM | POA: Diagnosis not present

## 2021-02-14 DIAGNOSIS — K921 Melena: Secondary | ICD-10-CM | POA: Diagnosis not present

## 2021-02-14 DIAGNOSIS — E785 Hyperlipidemia, unspecified: Secondary | ICD-10-CM | POA: Diagnosis not present

## 2021-02-14 DIAGNOSIS — D62 Acute posthemorrhagic anemia: Secondary | ICD-10-CM | POA: Diagnosis not present

## 2021-02-14 DIAGNOSIS — K440 Diaphragmatic hernia with obstruction, without gangrene: Secondary | ICD-10-CM | POA: Diagnosis not present

## 2021-02-17 ENCOUNTER — Telehealth (HOSPITAL_COMMUNITY): Payer: Self-pay | Admitting: Cardiology

## 2021-02-17 DIAGNOSIS — Z23 Encounter for immunization: Secondary | ICD-10-CM | POA: Diagnosis not present

## 2021-02-17 NOTE — Telephone Encounter (Signed)
Just an FYI. We have made several attempts to contact this patient including sending a letter to schedule or reschedule their echocardiogram. We will be removing the patient from the echo [place].  02/03/21 NO SHOWED- MAILED LETTER [facility]  01/09/21 [facility] to rescheduled @ 8:50/[facility]  01/02/21 [facility] to see if patient wants to reschdule/[facility] 11:37     Thank you

## 2021-03-04 DIAGNOSIS — K253 Acute gastric ulcer without hemorrhage or perforation: Secondary | ICD-10-CM | POA: Diagnosis not present

## 2021-03-04 DIAGNOSIS — D500 Iron deficiency anemia secondary to blood loss (chronic): Secondary | ICD-10-CM | POA: Diagnosis not present

## 2021-03-12 ENCOUNTER — Other Ambulatory Visit: Payer: Self-pay

## 2021-03-12 DIAGNOSIS — D500 Iron deficiency anemia secondary to blood loss (chronic): Secondary | ICD-10-CM

## 2021-03-13 ENCOUNTER — Inpatient Hospital Stay: Payer: Medicare Other | Attending: Hematology

## 2021-03-13 ENCOUNTER — Other Ambulatory Visit: Payer: Self-pay

## 2021-03-13 ENCOUNTER — Inpatient Hospital Stay: Payer: Medicare Other

## 2021-03-13 ENCOUNTER — Encounter: Payer: Self-pay | Admitting: Cardiovascular Disease

## 2021-03-13 ENCOUNTER — Ambulatory Visit (INDEPENDENT_AMBULATORY_CARE_PROVIDER_SITE_OTHER): Payer: Medicare Other | Admitting: Cardiovascular Disease

## 2021-03-13 VITALS — BP 125/72 | HR 77 | Temp 98.5°F | Resp 18

## 2021-03-13 VITALS — BP 144/88 | HR 86 | Resp 96 | Ht 62.0 in | Wt 186.0 lb

## 2021-03-13 DIAGNOSIS — K449 Diaphragmatic hernia without obstruction or gangrene: Secondary | ICD-10-CM

## 2021-03-13 DIAGNOSIS — D500 Iron deficiency anemia secondary to blood loss (chronic): Secondary | ICD-10-CM

## 2021-03-13 DIAGNOSIS — K922 Gastrointestinal hemorrhage, unspecified: Secondary | ICD-10-CM | POA: Insufficient documentation

## 2021-03-13 DIAGNOSIS — R0602 Shortness of breath: Secondary | ICD-10-CM | POA: Diagnosis not present

## 2021-03-13 DIAGNOSIS — Z8659 Personal history of other mental and behavioral disorders: Secondary | ICD-10-CM | POA: Diagnosis not present

## 2021-03-13 DIAGNOSIS — I10 Essential (primary) hypertension: Secondary | ICD-10-CM

## 2021-03-13 DIAGNOSIS — I5189 Other ill-defined heart diseases: Secondary | ICD-10-CM | POA: Diagnosis not present

## 2021-03-13 DIAGNOSIS — E785 Hyperlipidemia, unspecified: Secondary | ICD-10-CM

## 2021-03-13 LAB — CMP (CANCER CENTER ONLY)
ALT: 20 U/L (ref 0–44)
AST: 19 U/L (ref 15–41)
Albumin: 3.9 g/dL (ref 3.5–5.0)
Alkaline Phosphatase: 126 U/L (ref 38–126)
Anion gap: 8 (ref 5–15)
BUN: 20 mg/dL (ref 8–23)
CO2: 27 mmol/L (ref 22–32)
Calcium: 10.5 mg/dL — ABNORMAL HIGH (ref 8.9–10.3)
Chloride: 106 mmol/L (ref 98–111)
Creatinine: 0.77 mg/dL (ref 0.44–1.00)
GFR, Estimated: >60 mL/min (ref >60)
Glucose, Bld: 82 mg/dL (ref 70–99)
Potassium: 4 mmol/L (ref 3.5–5.1)
Sodium: 141 mmol/L (ref 135–145)
Total Bilirubin: 0.8 mg/dL (ref 0.3–1.2)
Total Protein: 7.1 g/dL (ref 6.5–8.1)

## 2021-03-13 LAB — CBC WITH DIFFERENTIAL (CANCER CENTER ONLY)
Abs Immature Granulocytes: 0.02 10*3/uL (ref 0.00–0.07)
Basophils Absolute: 0.1 10*3/uL (ref 0.0–0.1)
Basophils Relative: 1 %
Eosinophils Absolute: 0.3 10*3/uL (ref 0.0–0.5)
Eosinophils Relative: 5 %
HCT: 42.3 % (ref 36.0–46.0)
Hemoglobin: 13.6 g/dL (ref 12.0–15.0)
Immature Granulocytes: 0 %
Lymphocytes Relative: 26 %
Lymphs Abs: 1.4 10*3/uL (ref 0.7–4.0)
MCH: 29.5 pg (ref 26.0–34.0)
MCHC: 32.2 g/dL (ref 30.0–36.0)
MCV: 91.8 fL (ref 80.0–100.0)
Monocytes Absolute: 0.6 10*3/uL (ref 0.1–1.0)
Monocytes Relative: 12 %
Neutro Abs: 2.8 10*3/uL (ref 1.7–7.7)
Neutrophils Relative %: 56 %
Platelet Count: 179 10*3/uL (ref 150–400)
RBC: 4.61 MIL/uL (ref 3.87–5.11)
RDW: 21 % — ABNORMAL HIGH (ref 11.5–15.5)
WBC Count: 5.1 10*3/uL (ref 4.0–10.5)
nRBC: 0 % (ref 0.0–0.2)

## 2021-03-13 MED ORDER — SODIUM CHLORIDE 0.9 % IV SOLN
Freq: Once | INTRAVENOUS | Status: AC
Start: 2021-03-13 — End: 2021-03-13
  Filled 2021-03-13: qty 250

## 2021-03-13 MED ORDER — SODIUM CHLORIDE 0.9 % IV SOLN
750.0000 mg | Freq: Once | INTRAVENOUS | Status: AC
  Administered 2021-03-13: 750 mg via INTRAVENOUS
  Filled 2021-03-13: qty 15

## 2021-03-13 NOTE — Progress Notes (Signed)
Patient was observed for 30 minutes post infusion with no adverse reactions.

## 2021-03-13 NOTE — Patient Instructions (Signed)
Ferric carboxymaltose injection What is this medicine? FERRIC CARBOXYMALTOSE (ferr-ik car-box-ee-mol-toes) is an iron complex. Iron is used to make healthy red blood cells, which carry oxygen and nutrients throughout the body. This medicine is used to treat anemia in people with chronic kidney disease or people who cannot take iron by mouth. This medicine may be used for other purposes; ask your health care provider or pharmacist if you have questions. COMMON BRAND NAME(S): Injectafer What should I tell my health care provider before I take this medicine? They need to know if you have any of these conditions:  high levels of iron in the blood  liver disease  an unusual or allergic reaction to iron, other medicines, foods, dyes, or preservatives  pregnant or trying to get pregnant  breast-feeding How should I use this medicine? This medicine is for infusion into a vein. It is given by a health care professional in a hospital or clinic setting. Talk to your pediatrician regarding the use of this medicine in children. Special care may be needed. Overdosage: If you think you have taken too much of this medicine contact a poison control center or emergency room at once. NOTE: This medicine is only for you. Do not share this medicine with others. What if I miss a dose? Keep appointments for follow-up doses. It is important not to miss your dose. Call your care team if you are unable to keep an appointment. What may interact with this medicine? Do not take this medicine with any of the following medications:  deferoxamine  dimercaprol  other iron products This list may not describe all possible interactions. Give your health care provider a list of all the medicines, herbs, non-prescription drugs, or dietary supplements you use. Also tell them if you smoke, drink alcohol, or use illegal drugs. Some items may interact with your medicine. What should I watch for while using this  medicine? Visit your doctor or health care professional regularly. Tell your doctor if your symptoms do not start to get better or if they get worse. You may need blood work done while you are taking this medicine. You may need to follow a special diet. Talk to your doctor. Foods that contain iron include: whole grains/cereals, dried fruits, beans, or peas, leafy green vegetables, and organ meats (liver, kidney). What side effects may I notice from receiving this medicine? Side effects that you should report to your doctor or health care professional as soon as possible:  allergic reactions like skin rash, itching or hives, swelling of the face, lips, or tongue  dizziness  facial flushing Side effects that usually do not require medical attention (report to your doctor or health care professional if they continue or are bothersome):  changes in taste  constipation  headache  nausea, vomiting  pain, redness, or irritation at site where injected This list may not describe all possible side effects. Call your doctor for medical advice about side effects. You may report side effects to FDA at 1-800-FDA-1088. Where should I keep my medicine? This drug is given in a hospital or clinic and will not be stored at home. NOTE: This sheet is a summary. It may not cover all possible information. If you have questions about this medicine, talk to your doctor, pharmacist, or health care provider.  2021 Elsevier/Gold Standard (2020-10-08 14:00:47)  

## 2021-03-13 NOTE — Progress Notes (Signed)
Cardiology Office Note    Date:  03/19/2021   ID:  Molly Lucas, DOB 03/10/1946, MRN [id number]  PCP:  [name], MD  GI: Dr. Earlean Shawl Cardiologist:  Shelva Majestic, MD   Reestablishment of care with me.  I last saw the patient in May 2017.  History of Present Illness:  Molly Lucas is a 75 y.o. female who is followed by Dr. Sharlett Iles and Dr. Earlean Shawl.  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  I saw her initially in 2017 when she was referred by Dr. Earlean Shawl for evaluation of shortness of breath and fluid around her heart.  An echo Doppler study in January 2017 showed an EF of 55 to 60% there was suggestion of grade 2 diastolic dysfunction.  Her aortic root was mildly dilated.  She had mild to moderate mitral regurgitation and mild left atrial dilatation.  She was found to have a trivial pericardial effusion.  She underwent a Lexiscan Myoview study in May 2017 which was low risk and showed a defect in the inferior to inferolateral wall without associated ischemia and the defect was most likely due to diaphragmatic attenuation rather than prior infarction.  Post-rest EF was 65%.  Patient has a family history for CAD with her father suffering his first myocardial infarction at age 61.  In the past, she had been diagnosed with sleep apnea but was not using CPAP therapy consistently.  She was on pravastatin for hyperlipidemia.  On December 26, 2020 she was evaluated by Kerin Ransom, PA-C complaints of dyspnea on exertion.  She had visited her son and family in [place] and during that visit was very active.  She was consistent consciously forcing fluids to avoid dehydration and to stay hydrated.  She had noted some mild lower extremity edema.  When seen by Kerin Ransom, her blood pressure was 134/71.  He felt she was mildly volume overloaded secondary to overhydration and her diastolic dysfunction.  She was given a  short course of diuretic therapy with HCTZ for several days with improvement.  She was hospitalized on February 21 and discharged on January 01, 2021 with melanotic stools, transient shortness of breath, weakness, and she was noted to be severely anemic with a hemoglobin of 4.3.  She received 4 units of packed red blood cell transfusion.  She underwent an appendectomy on December 31, 2020 and was found to have a hiatal hernia associated Cameron erosions.  She was given IV iron on February 23.  GI recommended regular CBC and ferritin levels with periodic iron infusion.  Presently, she feels well.  She is living at [facility].  She sees Dr. Irene Limbo hematology/oncology and undergoes periodic infusion therapy.  She presents the office for reestablishment of care with me.  Presently she denies any chest pain.  Her shortness of breath has improved.  Her blood pressure at home typically is in the 564P with diastolics in the 32R.  She uses an oral appliance for her obstructive sleep apnea.  She is unaware of any palpitations, presyncope or syncope.   Past Medical History:  Diagnosis Date  . Anemia    Takes iron supplement  . Anxiety   . Arthritis   . Chronic pain of left thumb   . Constipation   . 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 Depression   . Fibroid, uterine   .  GERD (gastroesophageal reflux disease)   . History of blood transfusion   . Hx of seasonal allergies   . Hyperlipemia   . Hypertension    hx of off medication now.  . OA (osteoarthritis) of hip    left  . Sleep apnea    dentist made mouthipiece     Past Surgical History:  Procedure Laterality Date  . BIOPSY  03/30/2019   Procedure: BIOPSY;  Surgeon: Beavers, Kimberly, MD;  Location: [facility];  Service: Gastroenterology;;  . CARPOMETACARPEL SUSPENSION PLASTY  08/2015   with abductor pollicis longus tendon transfer and suspensionplasty  . COLONOSCOPY WITH  PROPOFOL N/A 03/31/2019   Procedure: COLONOSCOPY WITH PROPOFOL;  Surgeon: Beavers, Kimberly, MD;  Location: [facility];  Service: Gastroenterology;  Laterality: N/A;  . DE QUERVAIN'S RELEASE  08/2015  . DILATATION & CURRETTAGE/HYSTEROSCOPY WITH RESECTOCOPE  2007  . ESOPHAGOGASTRODUODENOSCOPY N/A 11/30/2015   Procedure: ESOPHAGOGASTRODUODENOSCOPY (EGD);  Surgeon: Jeffrey Medoff, MD;  Location: [facility];  Service: Endoscopy;  Laterality: N/A;  . ESOPHAGOGASTRODUODENOSCOPY (EGD) WITH PROPOFOL N/A 03/30/2019   Procedure: ESOPHAGOGASTRODUODENOSCOPY (EGD) WITH PROPOFOL;  Surgeon: Beavers, Kimberly, MD;  Location: [facility];  Service: Gastroenterology;  Laterality: N/A;  . ESOPHAGOGASTRODUODENOSCOPY (EGD) WITH PROPOFOL N/A 12/31/2020   Procedure: ESOPHAGOGASTRODUODENOSCOPY (EGD) WITH PROPOFOL;  Surgeon: Perry, John N, MD;  Location: [facility];  Service: Endoscopy;  Laterality: N/A;  . HERNIA REPAIR Left 06/2012  . INGUINAL HERNIA REPAIR Right 05/03/2013   Procedure: HERNIA REPAIR INGUINAL ADULT;  Surgeon: Douglas A Blackman, MD;  Location: [facility];  Service: General;  Laterality: Right;  . INGUINAL HERNIA REPAIR Right 08/23/2013   Procedure: LAPAROSCOPIC INGUINAL HERNIA;  Surgeon: Douglas A Blackman, MD;  Location: [facility];  Service: General;  Laterality: Right;  . INSERTION OF MESH Right 05/03/2013   Procedure: INSERTION OF MESH;  Surgeon: Douglas A Blackman, MD;  Location: [facility];  Service: General;  Laterality: Right;  . INSERTION OF MESH Right 08/23/2013   Procedure: INSERTION OF MESH;  Surgeon: Douglas A Blackman, MD;  Location: [facility];  Service: General;  Laterality: Right;  . METACARPOPHALANGEAL JOINT ARTHRODESIS  08/2015   with local bone graft  . TONSILLECTOMY    . TOTAL HIP ARTHROPLASTY  06/24/2012   Procedure: TOTAL HIP ARTHROPLASTY ANTERIOR APPROACH;  Surgeon: Christopher Y Blackman, MD;  Location: [facility];  Service: Orthopedics;  Laterality: Left;  Left total hip arthroplasty, anterior  approach  . UTERINE FIBROID SURGERY  2007  . WISDOM TOOTH EXTRACTION      Current Medications: Outpatient Medications Prior to Visit  Medication Sig Dispense Refill  . CALCIUM PO Take 2 tablets by mouth daily.     . cetirizine (ZYRTEC) 10 MG tablet Take 10 mg by mouth daily.    . Cholecalciferol (VITAMIN D3) 250 MCG (10000 UT) TABS Take 10,000 Units by mouth daily.     . CYANOCOBALAMIN PO Take 1 tablet by mouth daily.    . NON FORMULARY Take 1 capsule by mouth See admin instructions. Black [place] oil capsules: Take 1 capsule by mouth once a day    . Omega-3 1000 MG CAPS Take 2,000 mg by mouth daily.     . pantoprazole (PROTONIX) 40 MG tablet Take 1 tablet (40 mg total) by mouth 2 (two) times daily before a meal. 60 tablet 2  . pravastatin (PRAVACHOL) 20 MG tablet Take 20 mg by mouth at bedtime.     . Turmeric 500 MG CAPS Take 500 mg by mouth daily. Tumeric with cucumin    .   VIIBRYD 40 MG TABS Take 40 mg by mouth daily.    . vitamin C (ASCORBIC ACID) 500 MG tablet Take 500 mg by mouth daily.    . Doxepin HCl 6 MG TABS Take 6 mg by mouth at bedtime.     . Multiple Vitamins-Minerals (HAIR SKIN AND NAILS FORMULA PO) Take 1 tablet by mouth daily.    . multivitamin-lutein (OCUVITE-LUTEIN) CAPS Take 1 capsule by mouth daily.     No facility-administered medications prior to visit.     Allergies:   Penicillins   Social History   Socioeconomic History  . Marital status: Divorced    Spouse name: Not on file  . Number of children: Not on file  . Years of education: Not on file  . Highest education level: Not on file  Occupational History  . Not on file  Tobacco Use  . Smoking status: Never Smoker  . Smokeless tobacco: Never Used  Vaping Use  . Vaping Use: Never used  Substance and Sexual Activity  . Alcohol use: Yes    Alcohol/week: 2.0 standard drinks    Types: 2 Glasses of wine per week    Comment: occasional glass of wine   . Drug use: No  . Sexual activity: Not on file   Other Topics Concern  . Not on file  Social History Narrative  . 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  She has 9 grandchildren.  Family History:  The patient's family history includes Arthritis in her mother and sister; CAD in her maternal grandfather, paternal grandfather, and paternal grandmother; Dementia in her maternal grandfather; Heart failure in her maternal grandfather and mother; Osteoporosis in her mother.   ROS General: Negative; No fevers, chills, or night sweats;  HEENT: Negative; No changes in vision or hearing, sinus congestion, difficulty swallowing Pulmonary: Negative; No cough, wheezing, shortness of breath, hemoptysis Cardiovascular: Negative; No chest pain, presyncope, syncope, palpitations GI: Positive for hiatal hernia with [name] erosions GU: Negative; No dysuria, hematuria, or difficulty voiding Musculoskeletal: Positive for osteoarthritis Hematologic/Oncology: Positive for iron deficiency anemia Endocrine: Negative; no heat/cold intolerance; no diabetes Neuro: Negative; no changes in balance, headaches Skin: Negative; No rashes or skin lesions Psychiatric: Negative; No behavioral problems, depression Sleep: Negative; No snoring, daytime sleepiness, hypersomnolence, bruxism, restless legs, hypnogognic hallucinations, no cataplexy Other comprehensive 14 point system review is negative.   PHYSICAL EXAM:   VS:  BP (!) 144/88   Pulse 86   Resp (!) 96   Ht 5' 2" (1.575 m)   Wt 186 lb (84.4 kg)   BMI 34.02 kg/m     Repeat blood pressure by me was 132/84  Wt Readings from Last 3 Encounters:  03/13/21 186 lb (84.4 kg)  02/11/21 193 lb (87.5 kg)  01/28/21 193 lb 9.6 oz (87.8 kg)    General:  Alert, oriented, no distress.  Skin: normal turgor, no rashes, warm and dry HEENT: Normocephalic, atraumatic. Pupils equal round and reactive to light; sclera anicteric; extraocular muscles intact; Nose without nasal septal hypertrophy Mouth/Parynx benign; Mallinpatti scale 3 Neck: No JVD, no carotid bruits; normal carotid upstroke Lungs: clear to ausculatation and percussion; no wheezing or rales Chest wall: without tenderness to  palpitation Heart: PMI not displaced, RRR, s1 s2 normal, 1/6 systolic murmur, no diastolic murmur, no rubs, gallops, thrills, or heaves Abdomen: soft, nontender; no hepatosplenomehaly, BS+; abdominal aorta nontender and not dilated by palpation. Back: no CVA tenderness Pulses 2+ Musculoskeletal: full range of motion, normal strength, no joint deformities Extremities: no clubbing cyanosis or edema, Homan's sign negative  Neurologic: grossly nonfocal; Cranial nerves grossly wnl Psychologic: Normal mood and affect   Studies/Labs Reviewed:   EKG:  EKG is ordered today.  ECG (independently read by me): NSR at 87; right axis deviation, mild RV conduction delay; normal intervals  Recent Labs: BMP Latest Ref Rng & Units 03/13/2021 02/11/2021 01/28/2021  Glucose 70 - 99 mg/dL 82 109(H) 89  BUN 8 - 23 mg/dL 20 23 21  Creatinine 0.44 - 1.00 mg/dL 0.77 0.82 0.69  Sodium 135 - 145 mmol/L 141 141 141  Potassium 3.5 - 5.1 mmol/L 4.0 4.4 4.1  Chloride 98 - 111 mmol/L 106 106 107  CO2 22 - 32 mmol/L 27 24 26  Calcium 8.9 - 10.3 mg/dL 10.5(H) 10.2 10.6(H)     Hepatic Function Latest Ref Rng & Units 03/13/2021 02/11/2021 01/28/2021  Total Protein 6.5 - 8.1 g/dL 7.1 7.1 7.1  Albumin 3.5 - 5.0 g/dL 3.9 4.0 3.9  AST 15 - 41 U/L 19 17 25  ALT 0 - 44 U/L 20 21 22  Alk Phosphatase 38 - 126 U/L 126 114 103  Total Bilirubin 0.3 - 1.2 mg/dL 0.8 0.9 1.3(H)    CBC Latest Ref Rng & Units 03/13/2021 02/11/2021 01/28/2021  WBC 4.0 - 10.5 K/uL 5.1 6.3 6.2  Hemoglobin 12.0 - 15.0 g/dL 13.6  12.2 12.3  Hematocrit 36.0 - 46.0 % 42.3 39.0 40.9  Platelets 150 - 400 K/uL 179 244 199   Lab Results  Component Value Date   MCV 91.8 03/13/2021   MCV 83.9 02/11/2021   MCV 87.0 01/28/2021   Lab Results  Component Value Date   TSH 1.04 04/02/2016   No results found for: HGBA1C   BNP    Component Value Date/Time   BNP 193.8 (H) 11/29/2015 1702    ProBNP No results found for: PROBNP   Lipid Panel     Component Value Date/Time   CHOL 178 04/02/2016 0945   TRIG 84 04/02/2016 0945   HDL 89 04/02/2016 0945   CHOLHDL 2.0 04/02/2016 0945   VLDL 17 04/02/2016 0945   LDLCALC 72 04/02/2016 0945     RADIOLOGY: No results found.   Additional studies/ records that were reviewed today include:  I reviewed the records from her recent hospitalization, as well as Molly Lucas.   ASSESSMENT:    1. Essential hypertension   2. Hyperlipidemia, unspecified hyperlipidemia type   3. SOB (shortness of breath)   4. Diastolic dysfunction   5. Iron deficiency anemia due to chronic blood loss   6. Hiatal hernia   7. 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  She also has a history of anxiety/depression.  Remotely, an echo Doppler study in 2017 showed normal systolic function with grade 2 diastolic dysfunction, mild dilatation of her aortic root, with mild MR and mild left atrial enlargement with a trivial pericardial effusion.  A nuclear stress test was low risk noted a small inferior to inferolateral defect without associated ischemia.    Presently, she has remained fairly stable from a cardiovascular standpoint and denies any chest pain.  She has had some issues with lower extremity edema and several months ago when attempting to stay very hydrated when she was very active she may have developed some mild volume  overload which resolved with several days of HCTZ administration.  She was hospitalized in February with profound GI blood loss with a hemoglobin of 4.3 and required 4 units of packed red blood cell transfusion.  She was found to have hiatal hernia with Cameron erosions she currently undergoes monthly iron infusions.  She states her blood pressure at home typically is stable in the 120s over 70s.  Blood pressure was mildly increased today based on most recent hypertensive guidelines.  She is euvolemic on exam.  She takes pravastatin for hyperlipidemia.  Dr. Patterson is her primary physician who has been checking laboratory.  He also has been on Viibryd 40 mg for depression.  I have recommended that she undergo a follow-up echo Doppler study for reassessment of both systolic and diastolic function and previous regurgitation lesions.  I will contact her regarding the results.  I will see her in follow-up of her echo if indicated, otherwise as long as she remains stable I will see her in 1 year for reevaluation.   Medication Adjustments/Labs and Tests Ordered: Current medicines are reviewed at length with the patient today.  Concerns regarding medicines are outlined above.  Medication changes, Labs and Tests ordered today are listed in the Patient Instructions below. Patient Instructions  Medication Instructions:  Your physician recommends that you continue on your current medications as directed. Please refer to the Current Medication list given to you today.  *If you need a refill on your cardiac medications before your next appointment, please call your pharmacy*  Testing/Procedures: Your physician has requested that you have an echocardiogram. Echocardiography is a painless test that uses sound waves to create images of your heart. It provides your doctor with information about the size and shape of your heart and how well your heart's chambers and valves are working. This procedure takes approximately  one hour. There are no restrictions for this procedure. This procedure is done at [address].    Follow-Up: At [facility], you and your health needs are our priority.  As part of our continuing mission to provide you with exceptional heart care, we have created designated Provider Care Teams.  These Care Teams include your primary Cardiologist (physician) and Advanced Practice Providers (APPs -  Physician Assistants and Nurse Practitioners) who all work together to provide you with the care you need, when you need it.  We recommend signing up for the patient portal called "MyChart".  Sign up information is provided on this After Visit Summary.  MyChart is used to connect with patients for Virtual Visits (Telemedicine).  Patients are able to view lab/test results, encounter notes, upcoming appointments, etc.  Non-urgent messages can be sent to your provider as well.   To learn more about what you can do with MyChart, go to [email or web address].    Your next appointment:   12 month(s)  The format for your next appointment:   In Person  Provider:   Tom Kelly, MD    Signed, Thomas Kelly, MD  03/19/2021 2:32 PM    [facility] [address], [address], [place], [place]  [address] Phone: [phone]    

## 2021-03-13 NOTE — Patient Instructions (Signed)
Medication Instructions:  Your physician recommends that you continue on your current medications as directed. Please refer to the Current Medication list given to you today.  *If you need a refill on your cardiac medications before your next appointment, please call your pharmacy*  Testing/Procedures: Your physician has requested that you have an echocardiogram. Echocardiography is a painless test that uses sound waves to create images of your heart. It provides your doctor with information about the size and shape of your heart and how well your heart's chambers and valves are working. This procedure takes approximately one hour. There are no restrictions for this procedure. This procedure is done at [address].    Follow-Up: At [facility], you and your health needs are our priority.  As part of our continuing mission to provide you with exceptional heart care, we have created designated Provider Care Teams.  These Care Teams include your primary Cardiologist (physician) and Advanced Practice Providers (APPs -  Physician Assistants and Nurse Practitioners) who all work together to provide you with the care you need, when you need it.  We recommend signing up for the patient portal called "MyChart".  Sign up information is provided on this After Visit Summary.  MyChart is used to connect with patients for Virtual Visits (Telemedicine).  Patients are able to view lab/test results, encounter notes, upcoming appointments, etc.  Non-urgent messages can be sent to your provider as well.   To learn more about what you can do with MyChart, go to [email or web address].    Your next appointment:   12 month(s)  The format for your next appointment:   In Person  Provider:   Ellouise Newer, MD

## 2021-03-14 ENCOUNTER — Ambulatory Visit: Payer: Medicare Other | Admitting: Cardiovascular Disease

## 2021-03-14 LAB — FERRITIN: Ferritin: 73 ng/mL (ref 11–307)

## 2021-03-14 LAB — IRON AND TIBC
Iron: 96 ug/dL (ref 41–142)
Saturation Ratios: 24 % (ref 21–57)
TIBC: 401 ug/dL (ref 236–444)
UIBC: 305 ug/dL (ref 120–384)

## 2021-03-19 ENCOUNTER — Encounter: Payer: Self-pay | Admitting: Cardiovascular Disease

## 2021-03-27 DIAGNOSIS — F331 Major depressive disorder, recurrent, moderate: Secondary | ICD-10-CM | POA: Diagnosis not present

## 2021-03-28 ENCOUNTER — Other Ambulatory Visit: Payer: Medicare Other

## 2021-04-11 ENCOUNTER — Other Ambulatory Visit: Payer: Self-pay

## 2021-04-11 DIAGNOSIS — D500 Iron deficiency anemia secondary to blood loss (chronic): Secondary | ICD-10-CM

## 2021-04-14 ENCOUNTER — Inpatient Hospital Stay: Payer: Medicare Other | Attending: Hematology

## 2021-04-14 ENCOUNTER — Other Ambulatory Visit: Payer: Self-pay

## 2021-04-14 ENCOUNTER — Inpatient Hospital Stay: Payer: Medicare Other

## 2021-04-14 VITALS — BP 147/87 | HR 66 | Temp 98.7°F | Resp 18

## 2021-04-14 DIAGNOSIS — K922 Gastrointestinal hemorrhage, unspecified: Secondary | ICD-10-CM | POA: Insufficient documentation

## 2021-04-14 DIAGNOSIS — D500 Iron deficiency anemia secondary to blood loss (chronic): Secondary | ICD-10-CM | POA: Insufficient documentation

## 2021-04-14 LAB — CMP (CANCER CENTER ONLY)
ALT: 40 U/L (ref 0–44)
AST: 32 U/L (ref 15–41)
Albumin: 3.7 g/dL (ref 3.5–5.0)
Alkaline Phosphatase: 116 U/L (ref 38–126)
Anion gap: 9 (ref 5–15)
BUN: 11 mg/dL (ref 8–23)
CO2: 26 mmol/L (ref 22–32)
Calcium: 10.5 mg/dL — ABNORMAL HIGH (ref 8.9–10.3)
Chloride: 106 mmol/L (ref 98–111)
Creatinine: 0.71 mg/dL (ref 0.44–1.00)
GFR, Estimated: >60 mL/min (ref >60)
Glucose, Bld: 83 mg/dL (ref 70–99)
Potassium: 4.1 mmol/L (ref 3.5–5.1)
Sodium: 141 mmol/L (ref 135–145)
Total Bilirubin: 1.2 mg/dL (ref 0.3–1.2)
Total Protein: 6.8 g/dL (ref 6.5–8.1)

## 2021-04-14 LAB — CBC WITH DIFFERENTIAL (CANCER CENTER ONLY)
Abs Immature Granulocytes: 0 10*3/uL (ref 0.00–0.07)
Basophils Absolute: 0 10*3/uL (ref 0.0–0.1)
Basophils Relative: 1 %
Eosinophils Absolute: 0.2 10*3/uL (ref 0.0–0.5)
Eosinophils Relative: 6 %
HCT: 45.1 % (ref 36.0–46.0)
Hemoglobin: 14.8 g/dL (ref 12.0–15.0)
Immature Granulocytes: 0 %
Lymphocytes Relative: 29 %
Lymphs Abs: 1.2 10*3/uL (ref 0.7–4.0)
MCH: 31 pg (ref 26.0–34.0)
MCHC: 32.8 g/dL (ref 30.0–36.0)
MCV: 94.4 fL (ref 80.0–100.0)
Monocytes Absolute: 0.5 10*3/uL (ref 0.1–1.0)
Monocytes Relative: 13 %
Neutro Abs: 2.1 10*3/uL (ref 1.7–7.7)
Neutrophils Relative %: 51 %
Platelet Count: 162 10*3/uL (ref 150–400)
RBC: 4.78 MIL/uL (ref 3.87–5.11)
RDW: 16.9 % — ABNORMAL HIGH (ref 11.5–15.5)
WBC Count: 4.1 10*3/uL (ref 4.0–10.5)
nRBC: 0 % (ref 0.0–0.2)

## 2021-04-14 MED ORDER — SODIUM CHLORIDE 0.9 % IV SOLN
750.0000 mg | Freq: Once | INTRAVENOUS | Status: AC
  Administered 2021-04-14: 750 mg via INTRAVENOUS
  Filled 2021-04-14: qty 15

## 2021-04-14 MED ORDER — FAMOTIDINE 20 MG IN NS 100 ML IVPB: 20.0000 mg | Freq: Once | INTRAVENOUS | Status: DC | PRN

## 2021-04-14 MED ORDER — ALBUTEROL SULFATE (2.5 MG/3ML) 0.083% IN NEBU
2.5000 mg | INHALATION_SOLUTION | Freq: Once | RESPIRATORY_TRACT | Status: DC | PRN
  Filled 2021-04-14: qty 3

## 2021-04-14 MED ORDER — EPINEPHRINE 0.3 MG/0.3ML IJ SOAJ: 0.3000 mg | Freq: Once | INTRAMUSCULAR | Status: DC | PRN

## 2021-04-14 MED ORDER — METHYLPREDNISOLONE SODIUM SUCC 125 MG IJ SOLR: 125.0000 mg | Freq: Once | INTRAMUSCULAR | Status: DC | PRN

## 2021-04-14 MED ORDER — DIPHENHYDRAMINE HCL 50 MG/ML IJ SOLN: 50.0000 mg | Freq: Once | INTRAMUSCULAR | Status: DC | PRN

## 2021-04-14 MED ORDER — SODIUM CHLORIDE 0.9 % IV SOLN
Freq: Once | INTRAVENOUS | Status: DC | PRN
  Filled 2021-04-14: qty 250

## 2021-04-14 NOTE — Patient Instructions (Signed)

## 2021-04-15 LAB — FERRITIN: Ferritin: 191 ng/mL (ref 11–307)

## 2021-04-15 LAB — IRON AND TIBC
Iron: 116 ug/dL (ref 41–142)
Saturation Ratios: 38 % (ref 21–57)
TIBC: 304 ug/dL (ref 236–444)
UIBC: 187 ug/dL (ref 120–384)

## 2021-04-16 DIAGNOSIS — K219 Gastro-esophageal reflux disease without esophagitis: Secondary | ICD-10-CM | POA: Diagnosis not present

## 2021-04-16 DIAGNOSIS — D500 Iron deficiency anemia secondary to blood loss (chronic): Secondary | ICD-10-CM | POA: Diagnosis not present

## 2021-04-16 DIAGNOSIS — K449 Diaphragmatic hernia without obstruction or gangrene: Secondary | ICD-10-CM | POA: Diagnosis not present

## 2021-04-16 DIAGNOSIS — Z8719 Personal history of other diseases of the digestive system: Secondary | ICD-10-CM | POA: Diagnosis not present

## 2021-04-17 DIAGNOSIS — F331 Major depressive disorder, recurrent, moderate: Secondary | ICD-10-CM | POA: Diagnosis not present

## 2021-04-21 ENCOUNTER — Ambulatory Visit (HOSPITAL_COMMUNITY): Payer: Medicare Other | Attending: Cardiovascular Disease

## 2021-04-21 ENCOUNTER — Other Ambulatory Visit: Payer: Self-pay

## 2021-04-21 DIAGNOSIS — R0602 Shortness of breath: Secondary | ICD-10-CM

## 2021-04-21 DIAGNOSIS — I10 Essential (primary) hypertension: Secondary | ICD-10-CM | POA: Diagnosis not present

## 2021-04-21 DIAGNOSIS — E785 Hyperlipidemia, unspecified: Secondary | ICD-10-CM | POA: Insufficient documentation

## 2021-04-21 LAB — ECHOCARDIOGRAM COMPLETE
Area-P 1/2: 3.48 cm2
P 1/2 time: 202 msec
S' Lateral: 2.7 cm

## 2021-04-29 ENCOUNTER — Ambulatory Visit
Admission: RE | Admit: 2021-04-29 | Discharge: 2021-04-29 | Disposition: A | Discharge status: Home / Self Care | Payer: Medicare Other | Source: Ambulatory Visit | Attending: Obstetrics & Gynecology | Admitting: Obstetrics & Gynecology

## 2021-04-29 ENCOUNTER — Other Ambulatory Visit: Payer: Medicare Other

## 2021-04-29 ENCOUNTER — Other Ambulatory Visit: Payer: Self-pay

## 2021-04-29 DIAGNOSIS — D509 Iron deficiency anemia, unspecified: Secondary | ICD-10-CM | POA: Diagnosis not present

## 2021-04-29 DIAGNOSIS — E785 Hyperlipidemia, unspecified: Secondary | ICD-10-CM | POA: Diagnosis not present

## 2021-04-29 DIAGNOSIS — Z780 Asymptomatic menopausal state: Secondary | ICD-10-CM | POA: Diagnosis not present

## 2021-04-29 DIAGNOSIS — G4733 Obstructive sleep apnea (adult) (pediatric): Secondary | ICD-10-CM | POA: Diagnosis not present

## 2021-04-29 DIAGNOSIS — M810 Age-related osteoporosis without current pathological fracture: Secondary | ICD-10-CM | POA: Diagnosis not present

## 2021-05-02 DIAGNOSIS — M810 Age-related osteoporosis without current pathological fracture: Secondary | ICD-10-CM | POA: Diagnosis not present

## 2021-05-06 ENCOUNTER — Ambulatory Visit: Payer: Medicare Other | Admitting: Hematology

## 2021-05-13 ENCOUNTER — Other Ambulatory Visit: Payer: Self-pay | Admitting: *Deleted

## 2021-05-13 DIAGNOSIS — D500 Iron deficiency anemia secondary to blood loss (chronic): Secondary | ICD-10-CM

## 2021-05-13 NOTE — Progress Notes (Signed)
HEMATOLOGY/ONCOLOGY CLINIC NOTE  Date of Service: 05/14/2021  Patient Care Team: [name], MD as PCP - General (Internal Medicine)  CHIEF COMPLAINTS/PURPOSE OF CONSULTATION:  F/u for iron deficiency Anemia   HISTORY OF PRESENTING ILLNESS:   Molly Lucas is a wonderful 75 y.o. female who has been referred to [place] by her PCP Dr. Bevelyn Buckles for evaluation and management of weight loss with elevated Hemoglobin and Hematocrit and hypercalcemia. She presents to the clinic today accompanied by her son.   She notes she has lost weight recently but this was due to her trouble eating. She has seen GI Dr. Thana Farr and had a endoscopy last week which showed a hiatal hernia which made it hard to swallow. A biopsy was done as well. She was given Prilosec and her eating has recently improved. She has had 3 meals in the past few days. For the past few months she had no appetite. She has lost 15 pounds over the last 2 months.  She notes she is prone to being anemic. She has had anemia episodes 3 times in the past few years. First time in 2014 she was treated with IV Feraheme. Another time in 11/2015 she had a ulcer with GI bleeding and her Hg went down to 4 and she was given blood transfusion. In 10/2017 she her Hg went down to again and was again treated with IV Feraheme. Her last colonoscopy was 2 years ago and was clear from polyps or concerns. She notes having black stool only once since her ulcer due to Pepto bismol. She notes she has not been on oral calcium for 2-3 months. She denies back pain, bone pain or kidney stones.   Today she notes family stress. She has had a mammogram in the last 2 years. She now lives in a retirement community.  She notes she had fluid around her heart once and went to see cardiologist who cleared her.  On review of symptoms, pt notes tightness in chest resolved with Prilosec. She denies back pain or bone pain. She has decreased appetite still. She notes  since not eating much she has felt feverish and moderately sweaty. This has not occurred in the past few days. She notes her bowel movements are random because she is not currently eating much. She is ale to pass urine fine. She denies cough, chest pain or difficulty breathing. She notes stress fracture b/w L3 and L4 which has arthritis around it.    INTERVAL HISTORY:  Molly Lucas is a 75 y.o. female here for evaluation and management of anemia. The patient's last visit with [place] was on 01/28/2021. The pt reports that she is doing well overall.  The pt reports that she has been doing well. She notes that her Protonix has been decreased from BID to daily. The patient has gotten a bone density study recently. She has also been very active and doing much exercise and stretching. She notes she feels very physically fit. She takes 1000 mg calcium and 10000 IU D3 each day regularly.   The patient notes that she does not use a CPAP machine, but has a mouthpiece that was made for her and helps her very much with sleeping.  Lab results today 05/14/2021 of CBC w/diff and CMP is as follows: all values are WNL except for RDW of 15.9, Glucose of 114, Calcium of 10.4, Alkaline Phosphatase of 148. 05/14/2021 Ferritin pending. 05/14/2021 Iron pending.  On review of systems, pt reports imbalance  and denies brain fog, depression, difficulty sleeping, fatigue, lightheadedness, dizziness, abdominal pain, leg swelling, and any other symptoms.  MEDICAL HISTORY:  Past Medical History:  Diagnosis Date   Anemia    Takes iron supplement   Anxiety    Arthritis    Chronic pain of left thumb    Constipation    Deformity of left thumb joint    Z collapse deformity to nondominant left thumb   Depression    Fibroid, uterine    GERD (gastroesophageal reflux disease)    History of blood transfusion    Hx of seasonal allergies    Hyperlipemia    Hypertension    hx of off medication now.   OA (osteoarthritis)  of hip    left   Sleep apnea    dentist made mouthipiece     SURGICAL HISTORY: Past Surgical History:  Procedure Laterality Date   BIOPSY  03/30/2019   Procedure: BIOPSY;  Surgeon: Thornton Park, MD;  Location: [facility];  Service: Gastroenterology;;   [place]  08/2015   with abductor pollicis longus tendon transfer and suspensionplasty   COLONOSCOPY WITH PROPOFOL N/A 03/31/2019   Procedure: COLONOSCOPY WITH PROPOFOL;  Surgeon: Thornton Park, MD;  Location: [facility];  Service: Gastroenterology;  Laterality: N/A;   DE QUERVAIN'S RELEASE  08/2015   DILATATION & CURRETTAGE/HYSTEROSCOPY WITH RESECTOCOPE  2007   ESOPHAGOGASTRODUODENOSCOPY N/A 11/30/2015   Procedure: ESOPHAGOGASTRODUODENOSCOPY (EGD);  Surgeon: Richmond Campbell, MD;  Location: [facility];  Service: Endoscopy;  Laterality: N/A;   ESOPHAGOGASTRODUODENOSCOPY (EGD) WITH PROPOFOL N/A 03/30/2019   Procedure: ESOPHAGOGASTRODUODENOSCOPY (EGD) WITH PROPOFOL;  Surgeon: Thornton Park, MD;  Location: [facility];  Service: Gastroenterology;  Laterality: N/A;   ESOPHAGOGASTRODUODENOSCOPY (EGD) WITH PROPOFOL N/A 12/31/2020   Procedure: ESOPHAGOGASTRODUODENOSCOPY (EGD) WITH PROPOFOL;  Surgeon: Irene Shipper, MD;  Location: [facility];  Service: Endoscopy;  Laterality: N/A;   HERNIA REPAIR Left 06/2012   INGUINAL HERNIA REPAIR Right 05/03/2013   Procedure: HERNIA REPAIR INGUINAL ADULT;  Surgeon: Harl Bowie, MD;  Location: [facility];  Service: General;  Laterality: Right;   INGUINAL HERNIA REPAIR Right 08/23/2013   Procedure: LAPAROSCOPIC INGUINAL HERNIA;  Surgeon: Harl Bowie, MD;  Location: [facility];  Service: General;  Laterality: Right;   INSERTION OF MESH Right 05/03/2013   Procedure: INSERTION OF MESH;  Surgeon: Harl Bowie, MD;  Location: [facility];  Service: General;  Laterality: Right;   INSERTION OF MESH Right 08/23/2013   Procedure: INSERTION OF MESH;  Surgeon: Harl Bowie,  MD;  Location: [facility];  Service: General;  Laterality: Right;   METACARPOPHALANGEAL JOINT ARTHRODESIS  08/2015   with local bone graft   TONSILLECTOMY     TOTAL HIP ARTHROPLASTY  06/24/2012   Procedure: TOTAL HIP ARTHROPLASTY ANTERIOR APPROACH;  Surgeon: Mcarthur Rossetti, MD;  Location: [facility];  Service: Orthopedics;  Laterality: Left;  Left total hip arthroplasty, anterior approach   UTERINE FIBROID SURGERY  2007   WISDOM TOOTH EXTRACTION      SOCIAL HISTORY: Social History   Socioeconomic History   Marital status: Divorced    Spouse name: Not on file   Number of children: Not on file   Years of education: Not on file   Highest education level: Not on file  Occupational History   Not on file  Tobacco Use   Smoking status: Never   Smokeless tobacco: Never  Vaping Use   Vaping Use: Never used  Substance and Sexual Activity  Alcohol use: Yes    Alcohol/week: 2.0 standard drinks    Types: 2 Glasses of wine per week    Comment: occasional glass of wine    Drug use: No   Sexual activity: Not on file  Other Topics Concern   Not on file  Social History Narrative   Not on file   Social Determinants of Health   Financial Resource Strain: Not on file  Food Insecurity: Not on file  Transportation Needs: Not on file  Physical Activity: Not on file  Stress: Not on file  Social Connections: Not on file  Intimate Partner Violence: Not on file    FAMILY HISTORY: Family History  Problem Relation Age of Onset   Heart failure Mother    Osteoporosis Mother    Arthritis Mother    CAD Maternal Grandfather    Heart failure Maternal Grandfather    Dementia Maternal Grandfather    CAD Paternal Grandmother    CAD Paternal Grandfather    Arthritis Sister     ALLERGIES:  is allergic to penicillins.  MEDICATIONS:  Current Outpatient Medications  Medication Sig Dispense Refill   CALCIUM PO Take 2 tablets by mouth daily.      cetirizine (ZYRTEC) 10 MG tablet Take 10 mg by  mouth daily.     Cholecalciferol (VITAMIN D3) 250 MCG (10000 UT) TABS Take 10,000 Units by mouth daily.      CYANOCOBALAMIN PO Take 1 tablet by mouth daily.     NON FORMULARY Take 1 capsule by mouth See admin instructions. Black [place] oil capsules: Take 1 capsule by mouth once a day     Omega-3 1000 MG CAPS Take 2,000 mg by mouth daily.      pantoprazole (PROTONIX) 40 MG tablet Take 1 tablet (40 mg total) by mouth 2 (two) times daily before a meal. 60 tablet 2   pravastatin (PRAVACHOL) 20 MG tablet Take 20 mg by mouth at bedtime.      Turmeric 500 MG CAPS Take 500 mg by mouth daily. Tumeric with cucumin     VIIBRYD 40 MG TABS Take 40 mg by mouth daily.     vitamin C (ASCORBIC ACID) 500 MG tablet Take 500 mg by mouth daily.     No current facility-administered medications for this visit.    REVIEW OF SYSTEMS: 10 Point review of Systems was done is negative except as noted above.  PHYSICAL EXAMINATION: ECOG FS:2 - Symptomatic, <50% confined to bed  Vitals:   05/14/21 1502  BP: (!) 141/90  Pulse: 90  Resp: 19  Temp: 98.4 F (36.9 C)  SpO2: 98%    Wt Readings from Last 3 Encounters:  05/14/21 205 lb 8 oz (93.2 kg)  03/13/21 186 lb (84.4 kg)  02/11/21 193 lb (87.5 kg)   Body mass index is 37.59 kg/m.     GENERAL:alert, in no acute distress and comfortable SKIN: no acute rashes, no significant lesions EYES: conjunctiva are pink and non-injected, sclera anicteric OROPHARYNX: MMM, no exudates, no oropharyngeal erythema or ulceration NECK: supple, no JVD LYMPH:  no palpable lymphadenopathy in the cervical, axillary or inguinal regions LUNGS: clear to auscultation b/l with normal respiratory effort HEART: regular rate & rhythm ABDOMEN:  normoactive bowel sounds , non tender, not distended. Extremity: no pedal edema PSYCH: alert & oriented x 3 with fluent speech NEURO: no focal motor/sensory deficitss  LABORATORY DATA:  I have reviewed the data as listed  . CBC Latest  Ref Rng & Units 05/14/2021  04/14/2021 03/13/2021  WBC 4.0 - 10.5 K/uL 5.8 4.1 5.1  Hemoglobin 12.0 - 15.0 g/dL 14.9 14.8 13.6  Hematocrit 36.0 - 46.0 % 44.4 45.1 42.3  Platelets 150 - 400 K/uL 174 162 179    . CMP Latest Ref Rng & Units 05/14/2021 04/14/2021 03/13/2021  Glucose 70 - 99 mg/dL 114(H) 83 82  BUN 8 - 23 mg/dL 20 11 20   Creatinine 0.44 - 1.00 mg/dL 0.81 0.71 0.77  Sodium 135 - 145 mmol/L 143 141 141  Potassium 3.5 - 5.1 mmol/L 3.8 4.1 4.0  Chloride 98 - 111 mmol/L 108 106 106  CO2 22 - 32 mmol/L 28 26 27   Calcium 8.9 - 10.3 mg/dL 10.4(H) 10.5(H) 10.5(H)  Total Protein 6.5 - 8.1 g/dL 7.1 6.8 7.1  Total Bilirubin 0.3 - 1.2 mg/dL 1.2 1.2 0.8  Alkaline Phos 38 - 126 U/L 148(H) 116 126  AST 15 - 41 U/L 27 32 19  ALT 0 - 44 U/L 44 40 20   . Lab Results  Component Value Date   IRON 116 04/14/2021   TIBC 304 04/14/2021   IRONPCTSAT 38 04/14/2021   (Iron and TIBC)  Lab Results  Component Value Date   FERRITIN 191 04/14/2021   Component     Latest Ref Rng & Units 02/01/2018  IgG (Immunoglobin G), Serum     700 - 1,600 mg/dL 864  IgA     64 - 422 mg/dL 194  IgM (Immunoglobulin M), Srm     26 - 217 mg/dL 71  Total Protein ELP     6.0 - 8.5 g/dL 6.8  Albumin SerPl Elph-Mcnc     2.9 - 4.4 g/dL 3.8  Alpha 1     0.0 - 0.4 g/dL 0.3  Alpha2 Glob SerPl Elph-Mcnc     0.4 - 1.0 g/dL 0.7  B-Globulin SerPl Elph-Mcnc     0.7 - 1.3 g/dL 1.1  Gamma Glob SerPl Elph-Mcnc     0.4 - 1.8 g/dL 0.9  M Protein SerPl Elph-Mcnc     Not Observed g/dL Not Observed  Globulin, Total     2.2 - 3.9 g/dL 3.0  Albumin/Glob SerPl     0.7 - 1.7 1.3  IFE 1      Comment  Please Note (HCV):      Comment  PTH, Intact     15 - 65 pg/mL 56  Calcium, Total (PTH)     8.7 - 10.3 mg/dL 10.9 (H)  PTH Interp      Comment  Kappa free light chain     3.3 - 19.4 mg/L 23.4 (H)  Lamda free light chains     5.7 - 26.3 mg/L 10.3  Kappa, lamda light chain ratio     0.26 - 1.65 2.27 (H)  Beta-2  Microglobulin     0.6 - 2.4 mg/L 2.9 (H)  Sed Rate     0 - 22 mm/hr 2  LDH     125 - 245 U/L 169    RADIOGRAPHIC STUDIES: I have personally reviewed the radiological images as listed and agreed with the findings in the report. ECHOCARDIOGRAM COMPLETE  Result Date: 04/21/2021    ECHOCARDIOGRAM REPORT   Patient Name:   EVIAN DERRINGER [facility] Date of Exam: 04/21/2021 Medical Rec #:  [id number]          Height:       62.0 in Accession #:    [id number]         Weight:  186.0 lb Date of Birth:  05-Sep-1946         BSA:          1.854 m Patient Age:    67 years           BP:           160/111 mmHg Patient Gender: F                  HR:           84 bpm. Exam Location:  [address] Procedure: 2D Echo, Color Doppler, Cardiac Doppler and Strain Analysis Indications:    I10 Hypertension  History:        Patient has prior history of Echocardiogram examinations, most                 recent 11/30/2015. Signs/Symptoms:Shortness of Breath; Risk                 Factors:Hypertension, Sleep Apnea and HLD.  Sonographer:    Marygrace Drought RCS Referring Phys: Valeria  1. Left ventricular ejection fraction, by estimation, is 60 to 65%. The left ventricle has normal function. The left ventricle has no regional wall motion abnormalities. Left ventricular diastolic parameters are indeterminate. The average left ventricular global longitudinal strain is -20.3 %.  2. Right ventricular systolic function is normal. The right ventricular size is normal. There is normal pulmonary artery systolic pressure.  3. The mitral valve is normal in structure. No evidence of mitral valve regurgitation.  4. The aortic valve is normal in structure. Aortic valve regurgitation is mild. FINDINGS  Left Ventricle: Left ventricular ejection fraction, by estimation, is 60 to 65%. The left ventricle has normal function. The left ventricle has no regional wall motion abnormalities. The average left ventricular global longitudinal  strain is -20.3 %. The left ventricular internal cavity size was normal in size. There is no left ventricular hypertrophy. Left ventricular diastolic parameters are indeterminate. Right Ventricle: The right ventricular size is normal. No increase in right ventricular wall thickness. Right ventricular systolic function is normal. There is normal pulmonary artery systolic pressure. The tricuspid regurgitant velocity is 2.09 m/s, and  with an assumed right atrial pressure of 3 mmHg, the estimated right ventricular systolic pressure is 52.8 mmHg. Left Atrium: Left atrial size was normal in size. Right Atrium: Right atrial size was normal in size. Pericardium: There is no evidence of pericardial effusion. Mitral Valve: The mitral valve is normal in structure. No evidence of mitral valve regurgitation. Tricuspid Valve: The tricuspid valve is normal in structure. Tricuspid valve regurgitation is trivial. Aortic Valve: The aortic valve is normal in structure. Aortic valve regurgitation is mild. Aortic regurgitation PHT measures 202 msec. Pulmonic Valve: The pulmonic valve was normal in structure. Pulmonic valve regurgitation is not visualized. Aorta: The aortic root and ascending aorta are structurally normal, with no evidence of dilitation. IAS/Shunts: The atrial septum is grossly normal.  LEFT VENTRICLE PLAX 2D LVIDd:         3.90 cm  Diastology LVIDs:         2.70 cm  LV e' medial:    7.07 cm/s LV PW:         1.10 cm  LV E/e' medial:  12.2 LV IVS:        1.00 cm  LV e' lateral:   4.24 cm/s LVOT diam:     2.00 cm  LV E/e' lateral: 20.3 LV SV:  60 LV SV Index:   33       2D Longitudinal Strain LVOT Area:     3.14 cm 2D Strain GLS Avg:     -20.3 %  RIGHT VENTRICLE RV Basal diam:  3.70 cm RVSP:           20.5 mmHg LEFT ATRIUM             Index       RIGHT ATRIUM           Index LA diam:        2.90 cm 1.56 cm/m  RA Pressure: 3.00 mmHg LA Vol (A2C):   44.6 ml 24.06 ml/m RA Area:     14.40 cm LA Vol (A4C):   33.9 ml  18.29 ml/m RA Volume:   35.10 ml  18.94 ml/m LA Biplane Vol: 40.0 ml 21.58 ml/m  AORTIC VALVE LVOT Vmax:   90.10 cm/s LVOT Vmean:  67.600 cm/s LVOT VTI:    0.192 m AI PHT:      202 msec  AORTA Ao Root diam: 3.00 cm Ao Asc diam:  3.60 cm MITRAL VALVE               TRICUSPID VALVE MV Area (PHT):             TR Peak grad:   17.5 mmHg MV Decel Time:             TR Vmax:        209.00 cm/s MV E velocity: 86.10 cm/s  Estimated RAP:  3.00 mmHg MV A velocity: 90.80 cm/s  RVSP:           20.5 mmHg MV E/A ratio:  0.95                            SHUNTS                            Systemic VTI:  0.19 m                            Systemic Diam: 2.00 cm Mertie Moores MD Electronically signed by Mertie Moores MD Signature Date/Time: 04/21/2021/1:01:11 PM    Final    DG MOBILE BONE DENSITY  Result Date: 04/30/2021 CLINICAL DATA:  Postmenopausal. EXAM: DUAL X-RAY ABSORPTIOMETRY (DXA) FOR BONE MINERAL DENSITY TECHNIQUE: Bone mineral density measurements are performed of the spine, hip, and forearm, as appropriate, per International Society of Clinical Densitometry recommendations. The pertinent regions of interest are reported below. Non-contributory values are not reported. Images are obtained for bone mineral density measurement and are not obtained for diagnostic purposes. FINDINGS: LEFT FOREARM (1/3 RADIUS) Bone Mineral Density (BMD):  0.465 Young Adult T Score:  -3.8 Z Score:  -1.1 RIGHT FEMUR NECK Bone Mineral Density (BMD):  0.734 g/cm2 Young Adult T-Score: -1.0 Z-Score:  0.1 Unit: This study was performed at [facility] on the [place] (S/N [phone]), software version 13.4.2. Scan quality: The scan quality is good. Exclusions: Lumbar spine excluded due to scoliosis and degenerative change. Left hip excluded due to previous left hip surgery. ASSESSMENT: Patient's diagnostic category is OSTEOPOROSIS by WHO Criteria. FRACTURE RISK: INCREASED FRAX: World Health Organization FRAX assessment of absolute  fracture risk is not calculated for this patient because the patient has osteoporosis and a history of prior vertebral fracture.  COMPARISON: None. RECOMMENDATIONS 1. All patients should optimize calcium and vitamin D intake. 2. Consider FDA-approved medical therapies in postmenopausal women and men aged 72 years and older, based on the following: - A hip or vertebral (clinical or morphometric) fracture - T-score less than or equal to -2.5 at the femoral neck or spine after appropriate evaluation to exclude secondary causes - Low bone mass (T-score between -1.0 and -2.5 at the femoral neck or spine) and a 10-year probability of a hip fracture greater than or equal to 3% or a 10-year probability of a major osteoporosis-related fracture greater than or equal to 20% based on the [place]-adapted WHO algorithm - Clinician judgment and/or patient preferences may indicate treatment for people with 10-year fracture probabilities above or below these levels 3. Patients with diagnosis of osteoporosis or at high risk for fracture should have regular bone mineral density tests. For patients eligible for Medicare, routine testing is allowed once every 2 years. The testing frequency can be increased to one year for patients who have rapidly progressing disease, those who are receiving or discontinuing medical therapy to restore bone mass, or have additional risk factors. Electronically Signed   By: Lajean Manes M.D.   On: 04/30/2021 16:20    ASSESSMENT & PLAN:   Molly Lucas is a 75 y.o. Caucasian female with  1. Severe iron deficiency Anemia due to GI bleeding  2. Hiatal Hernia with h/o cameron ulcers with previous recurrent Iron deficiency needing IV iron replacement in the past. -Found on GI workup and likely cause of GI bleeding leading to anemia.   3. Recurrent Anemia - secondary to GI blood loss -in 2014 treated with IV Feraheme, 2017 after GI ulcer and was treated with Blood transfusion, 11/2016 treated with IV  Feraheme.   . Lab Results  Component Value Date   IRON 116 04/14/2021   TIBC 304 04/14/2021   IRONPCTSAT 38 04/14/2021   (Iron and TIBC)  Lab Results  Component Value Date   FERRITIN 191 04/14/2021   4. Weight loss - 15 pounds over 2 months -secondary to GI issues  -Improved eating with Prilosec 40mg  once a day  5. H/o Elevated Calcium at 11.7 rpt today improved to 11 PTH - WNL Myeloma panel - no M spike and neg IFE K/L ratio minimally abnormal but only minimal increase in Kappa light chains B2MG  -borderline elevated.   PLAN:  -Discussed pt labwork today, 05/14/2021; blood counts normal, other labs pending. -Advised pt that Protonix increases risk of osteoporosis. She may need to increase her Vitamin D she is taking daily. -Recommended Prolia injection every six months. The patient recently had a dental exam this morning with no issues. Pt will get this managed by her PCP. -Will add on Vitamin D levels if possible or just get them added to the next labs. -Will move iron infusions to every two months after the next two scheduled infusions (today and next month). -Will see back in 4 months with labs.   FOLLOW UP: IV Injectafer in 1 month as scheduled with labs then IV Injectafer every 2 months with labs x 3. MD visit in 5 months with treatment   The total time spent in the appointment was 30 minutes and more than 50% was on counseling and direct patient cares.   All of the patient's questions were answered with apparent satisfaction. The patient knows to call the clinic with any problems, questions or concerns.    Sullivan Lone MD [place] AAHIVMS [facility]  [facility] Hematology/Oncology Physician [facility]  (Office):       [phone] (Work cell):  [phone] (Fax):           [phone]  05/14/2021 3:39 PM  I, Reinaldo Raddle, am acting as scribe for Dr. Sullivan Lone, MD.  .I have reviewed the above documentation for accuracy and completeness, and I agree with the  above. Brunetta Genera MD

## 2021-05-14 ENCOUNTER — Inpatient Hospital Stay (HOSPITAL_BASED_OUTPATIENT_CLINIC_OR_DEPARTMENT_OTHER): Payer: Medicare Other | Admitting: Hematology

## 2021-05-14 ENCOUNTER — Other Ambulatory Visit: Payer: Self-pay

## 2021-05-14 ENCOUNTER — Inpatient Hospital Stay: Payer: Medicare Other

## 2021-05-14 ENCOUNTER — Inpatient Hospital Stay: Payer: Medicare Other | Attending: Hematology

## 2021-05-14 VITALS — BP 139/75 | HR 66 | Temp 97.7°F | Resp 16

## 2021-05-14 VITALS — BP 141/90 | HR 90 | Temp 98.4°F | Resp 19 | Wt 205.5 lb

## 2021-05-14 DIAGNOSIS — K922 Gastrointestinal hemorrhage, unspecified: Secondary | ICD-10-CM | POA: Diagnosis not present

## 2021-05-14 DIAGNOSIS — D500 Iron deficiency anemia secondary to blood loss (chronic): Secondary | ICD-10-CM

## 2021-05-14 DIAGNOSIS — K254 Chronic or unspecified gastric ulcer with hemorrhage: Secondary | ICD-10-CM

## 2021-05-14 LAB — CMP (CANCER CENTER ONLY)
ALT: 44 U/L (ref 0–44)
AST: 27 U/L (ref 15–41)
Albumin: 3.7 g/dL (ref 3.5–5.0)
Alkaline Phosphatase: 148 U/L — ABNORMAL HIGH (ref 38–126)
Anion gap: 7 (ref 5–15)
BUN: 20 mg/dL (ref 8–23)
CO2: 28 mmol/L (ref 22–32)
Calcium: 10.4 mg/dL — ABNORMAL HIGH (ref 8.9–10.3)
Chloride: 108 mmol/L (ref 98–111)
Creatinine: 0.81 mg/dL (ref 0.44–1.00)
GFR, Estimated: >60 mL/min (ref >60)
Glucose, Bld: 114 mg/dL — ABNORMAL HIGH (ref 70–99)
Potassium: 3.8 mmol/L (ref 3.5–5.1)
Sodium: 143 mmol/L (ref 135–145)
Total Bilirubin: 1.2 mg/dL (ref 0.3–1.2)
Total Protein: 7.1 g/dL (ref 6.5–8.1)

## 2021-05-14 LAB — CBC WITH DIFFERENTIAL (CANCER CENTER ONLY)
Abs Immature Granulocytes: 0.03 10*3/uL (ref 0.00–0.07)
Basophils Absolute: 0 10*3/uL (ref 0.0–0.1)
Basophils Relative: 1 %
Eosinophils Absolute: 0.3 10*3/uL (ref 0.0–0.5)
Eosinophils Relative: 5 %
HCT: 44.4 % (ref 36.0–46.0)
Hemoglobin: 14.9 g/dL (ref 12.0–15.0)
Immature Granulocytes: 1 %
Lymphocytes Relative: 25 %
Lymphs Abs: 1.4 10*3/uL (ref 0.7–4.0)
MCH: 32.3 pg (ref 26.0–34.0)
MCHC: 33.6 g/dL (ref 30.0–36.0)
MCV: 96.3 fL (ref 80.0–100.0)
Monocytes Absolute: 0.6 10*3/uL (ref 0.1–1.0)
Monocytes Relative: 10 %
Neutro Abs: 3.4 10*3/uL (ref 1.7–7.7)
Neutrophils Relative %: 58 %
Platelet Count: 174 10*3/uL (ref 150–400)
RBC: 4.61 MIL/uL (ref 3.87–5.11)
RDW: 15.9 % — ABNORMAL HIGH (ref 11.5–15.5)
WBC Count: 5.8 10*3/uL (ref 4.0–10.5)
nRBC: 0 % (ref 0.0–0.2)

## 2021-05-14 LAB — FERRITIN: Ferritin: 414 ng/mL — ABNORMAL HIGH (ref 11–307)

## 2021-05-14 LAB — IRON AND TIBC
Iron: 120 ug/dL (ref 41–142)
Saturation Ratios: 41 % (ref 21–57)
TIBC: 295 ug/dL (ref 236–444)
UIBC: 175 ug/dL (ref 120–384)

## 2021-05-14 MED ORDER — SODIUM CHLORIDE 0.9 % IV SOLN
750.0000 mg | Freq: Once | INTRAVENOUS | Status: AC
  Administered 2021-05-14: 750 mg via INTRAVENOUS
  Filled 2021-05-14: qty 15

## 2021-05-14 MED ORDER — SODIUM CHLORIDE 0.9 % IV SOLN
Freq: Once | INTRAVENOUS | Status: AC
Start: 2021-05-14 — End: 2021-05-14
  Filled 2021-05-14: qty 250

## 2021-05-14 NOTE — Patient Instructions (Signed)
Iron Dextran injection What is this medication? IRON DEXTRAN (AHY ern DEX tran) is an iron complex. Iron is used to make healthy red blood cells, which carry oxygen and nutrients through the body. This medicine is used to treat people who cannot take iron by mouth and havelow levels of iron in the blood. This medicine may be used for other purposes; ask your health care provider orpharmacist if you have questions. COMMON BRAND NAME(S): Dexferrum, INFeD What should I tell my care team before I take this medication? They need to know if you have any of these conditions: anemia not caused by low iron levels heart disease high levels of iron in the blood kidney disease liver disease an unusual or allergic reaction to iron, other medicines, foods, dyes, or preservatives pregnant or trying to get pregnant breast-feeding How should I use this medication? This medicine is for injection into a vein or a muscle. It is given by a healthcare professional in a hospital or clinic setting. Talk to your pediatrician regarding the use of this medicine in children. While this drug may be prescribed for children as young as 65 months old for selectedconditions, precautions do apply. Overdosage: If you think you have taken too much of this medicine contact apoison control center or emergency room at once. NOTE: This medicine is only for you. Do not share this medicine with others. What if I miss a dose? It is important not to miss your dose. Call your doctor or health careprofessional if you are unable to keep an appointment. What may interact with this medication? Do not take this medicine with any of the following medications: deferoxamine dimercaprol other iron products This medicine may also interact with the following medications: chloramphenicol deferasirox This list may not describe all possible interactions. Give your health care provider a list of all the medicines, herbs, non-prescription drugs,  or dietary supplements you use. Also tell them if you smoke, drink alcohol, or use illegaldrugs. Some items may interact with your medicine. What should I watch for while using this medication? Visit your doctor or health care professional regularly. Tell your doctor if your symptoms do not start to get better or if they get worse. You may needblood work done while you are taking this medicine. You may need to follow a special diet. Talk to your doctor. Foods that contain iron include: whole grains/cereals, dried fruits, beans, or peas, leafy greenvegetables, and organ meats (liver, kidney). Long-term use of this medicine may increase your risk of some cancers. Talk toyour doctor about how to limit your risk. What side effects may I notice from receiving this medication? Side effects that you should report to your doctor or health care professionalas soon as possible: allergic reactions like skin rash, itching or hives, swelling of the face, lips, or tongue blue lips, nails, or skin breathing problems changes in blood pressure chest pain confusion fast, irregular heartbeat feeling faint or lightheaded, falls fever or chills flushing, sweating, or hot feelings joint or muscle aches or pains pain, tingling, numbness in the hands or feet seizures unusually weak or tired Side effects that usually do not require medical attention (report to yourdoctor or health care professional if they continue or are bothersome): change in taste (metallic taste) diarrhea headache irritation at site where injected nausea, vomiting stomach upset This list may not describe all possible side effects. Call your doctor for medical advice about side effects. You may report side effects to FDA at1-800-FDA-1088. Where should I keep my  medication? This drug is given in a hospital or clinic and will not be stored at home. NOTE: This sheet is a summary. It may not cover all possible information. If you have questions  about this medicine, talk to your doctor, pharmacist, orhealth care provider.  2022 Elsevier/Gold Standard (2008-03-13 16:59:50)

## 2021-05-16 ENCOUNTER — Telehealth: Payer: Self-pay | Admitting: Hematology

## 2021-05-16 NOTE — Telephone Encounter (Signed)
Left message with follow-up appointments per 7/6 los. 

## 2021-05-20 ENCOUNTER — Encounter: Payer: Self-pay | Admitting: Hematology

## 2021-05-28 DIAGNOSIS — H52201 Unspecified astigmatism, right eye: Secondary | ICD-10-CM | POA: Diagnosis not present

## 2021-05-28 DIAGNOSIS — H26491 Other secondary cataract, right eye: Secondary | ICD-10-CM | POA: Diagnosis not present

## 2021-05-28 DIAGNOSIS — H35373 Puckering of macula, bilateral: Secondary | ICD-10-CM | POA: Diagnosis not present

## 2021-05-28 DIAGNOSIS — H43813 Vitreous degeneration, bilateral: Secondary | ICD-10-CM | POA: Diagnosis not present

## 2021-05-30 DIAGNOSIS — F331 Major depressive disorder, recurrent, moderate: Secondary | ICD-10-CM | POA: Diagnosis not present

## 2021-06-13 ENCOUNTER — Inpatient Hospital Stay: Payer: Medicare Other

## 2021-06-13 ENCOUNTER — Inpatient Hospital Stay: Payer: Medicare Other | Attending: Hematology

## 2021-06-13 ENCOUNTER — Other Ambulatory Visit: Payer: Self-pay

## 2021-06-13 VITALS — BP 129/81 | HR 66 | Temp 98.3°F | Resp 18

## 2021-06-13 DIAGNOSIS — D509 Iron deficiency anemia, unspecified: Secondary | ICD-10-CM | POA: Insufficient documentation

## 2021-06-13 DIAGNOSIS — D500 Iron deficiency anemia secondary to blood loss (chronic): Secondary | ICD-10-CM

## 2021-06-13 LAB — CBC WITH DIFFERENTIAL (CANCER CENTER ONLY)
Abs Immature Granulocytes: 0.03 10*3/uL (ref 0.00–0.07)
Basophils Absolute: 0 10*3/uL (ref 0.0–0.1)
Basophils Relative: 1 %
Eosinophils Absolute: 0.2 10*3/uL (ref 0.0–0.5)
Eosinophils Relative: 4 %
HCT: 44.6 % (ref 36.0–46.0)
Hemoglobin: 14.9 g/dL (ref 12.0–15.0)
Immature Granulocytes: 1 %
Lymphocytes Relative: 23 %
Lymphs Abs: 1.4 10*3/uL (ref 0.7–4.0)
MCH: 33 pg (ref 26.0–34.0)
MCHC: 33.4 g/dL (ref 30.0–36.0)
MCV: 98.9 fL (ref 80.0–100.0)
Monocytes Absolute: 0.5 10*3/uL (ref 0.1–1.0)
Monocytes Relative: 9 %
Neutro Abs: 3.7 10*3/uL (ref 1.7–7.7)
Neutrophils Relative %: 62 %
Platelet Count: 151 10*3/uL (ref 150–400)
RBC: 4.51 MIL/uL (ref 3.87–5.11)
RDW: 14.3 % (ref 11.5–15.5)
WBC Count: 5.9 10*3/uL (ref 4.0–10.5)
nRBC: 0 % (ref 0.0–0.2)

## 2021-06-13 LAB — CMP (CANCER CENTER ONLY)
ALT: 65 U/L — ABNORMAL HIGH (ref 0–44)
AST: 31 U/L (ref 15–41)
Albumin: 3.9 g/dL (ref 3.5–5.0)
Alkaline Phosphatase: 143 U/L — ABNORMAL HIGH (ref 38–126)
Anion gap: 7 (ref 5–15)
BUN: 18 mg/dL (ref 8–23)
CO2: 25 mmol/L (ref 22–32)
Calcium: 10.4 mg/dL — ABNORMAL HIGH (ref 8.9–10.3)
Chloride: 107 mmol/L (ref 98–111)
Creatinine: 0.81 mg/dL (ref 0.44–1.00)
GFR, Estimated: >60 mL/min (ref >60)
Glucose, Bld: 128 mg/dL — ABNORMAL HIGH (ref 70–99)
Potassium: 4.1 mmol/L (ref 3.5–5.1)
Sodium: 139 mmol/L (ref 135–145)
Total Bilirubin: 1.6 mg/dL — ABNORMAL HIGH (ref 0.3–1.2)
Total Protein: 6.7 g/dL (ref 6.5–8.1)

## 2021-06-13 MED ORDER — SODIUM CHLORIDE 0.9 % IV SOLN
INTRAVENOUS | Status: DC
  Filled 2021-06-13: qty 250

## 2021-06-13 MED ORDER — SODIUM CHLORIDE 0.9 % IV SOLN
750.0000 mg | Freq: Once | INTRAVENOUS | Status: AC
  Administered 2021-06-13: 750 mg via INTRAVENOUS
  Filled 2021-06-13: qty 15

## 2021-06-13 NOTE — Patient Instructions (Signed)
Iron Dextran injection What is this medication? IRON DEXTRAN (AHY ern DEX tran) is an iron complex. Iron is used to make healthy red blood cells, which carry oxygen and nutrients through the body. This medicine is used to treat people who cannot take iron by mouth and havelow levels of iron in the blood. This medicine may be used for other purposes; ask your health care provider orpharmacist if you have questions. COMMON BRAND NAME(S): Dexferrum, INFeD What should I tell my care team before I take this medication? They need to know if you have any of these conditions: anemia not caused by low iron levels heart disease high levels of iron in the blood kidney disease liver disease an unusual or allergic reaction to iron, other medicines, foods, dyes, or preservatives pregnant or trying to get pregnant breast-feeding How should I use this medication? This medicine is for injection into a vein or a muscle. It is given by a healthcare professional in a hospital or clinic setting. Talk to your pediatrician regarding the use of this medicine in children. While this drug may be prescribed for children as young as 57 months old for selectedconditions, precautions do apply. Overdosage: If you think you have taken too much of this medicine contact apoison control center or emergency room at once. NOTE: This medicine is only for you. Do not share this medicine with others. What if I miss a dose? It is important not to miss your dose. Call your doctor or health careprofessional if you are unable to keep an appointment. What may interact with this medication? Do not take this medicine with any of the following medications: deferoxamine dimercaprol other iron products This medicine may also interact with the following medications: chloramphenicol deferasirox This list may not describe all possible interactions. Give your health care provider a list of all the medicines, herbs, non-prescription drugs,  or dietary supplements you use. Also tell them if you smoke, drink alcohol, or use illegaldrugs. Some items may interact with your medicine. What should I watch for while using this medication? Visit your doctor or health care professional regularly. Tell your doctor if your symptoms do not start to get better or if they get worse. You may needblood work done while you are taking this medicine. You may need to follow a special diet. Talk to your doctor. Foods that contain iron include: whole grains/cereals, dried fruits, beans, or peas, leafy greenvegetables, and organ meats (liver, kidney). Long-term use of this medicine may increase your risk of some cancers. Talk toyour doctor about how to limit your risk. What side effects may I notice from receiving this medication? Side effects that you should report to your doctor or health care professionalas soon as possible: allergic reactions like skin rash, itching or hives, swelling of the face, lips, or tongue blue lips, nails, or skin breathing problems changes in blood pressure chest pain confusion fast, irregular heartbeat feeling faint or lightheaded, falls fever or chills flushing, sweating, or hot feelings joint or muscle aches or pains pain, tingling, numbness in the hands or feet seizures unusually weak or tired Side effects that usually do not require medical attention (report to yourdoctor or health care professional if they continue or are bothersome): change in taste (metallic taste) diarrhea headache irritation at site where injected nausea, vomiting stomach upset This list may not describe all possible side effects. Call your doctor for medical advice about side effects. You may report side effects to FDA at1-800-FDA-1088. Where should I keep my  medication? This drug is given in a hospital or clinic and will not be stored at home. NOTE: This sheet is a summary. It may not cover all possible information. If you have questions  about this medicine, talk to your doctor, pharmacist, orhealth care provider.  2022 Elsevier/Gold Standard (2008-03-13 16:59:50)

## 2021-06-16 LAB — FERRITIN: Ferritin: 721 ng/mL — ABNORMAL HIGH (ref 11–307)

## 2021-06-16 LAB — IRON AND TIBC
Iron: 147 ug/dL — ABNORMAL HIGH (ref 41–142)
Saturation Ratios: 51 % (ref 21–57)
TIBC: 285 ug/dL (ref 236–444)
UIBC: 138 ug/dL (ref 120–384)

## 2021-06-26 ENCOUNTER — Encounter: Payer: Self-pay | Admitting: Hematology

## 2021-07-01 DIAGNOSIS — K922 Gastrointestinal hemorrhage, unspecified: Secondary | ICD-10-CM | POA: Diagnosis not present

## 2021-07-31 DIAGNOSIS — K921 Melena: Secondary | ICD-10-CM | POA: Diagnosis not present

## 2021-08-13 ENCOUNTER — Other Ambulatory Visit: Payer: Self-pay

## 2021-08-13 DIAGNOSIS — D500 Iron deficiency anemia secondary to blood loss (chronic): Secondary | ICD-10-CM

## 2021-08-14 ENCOUNTER — Other Ambulatory Visit: Payer: Self-pay

## 2021-08-14 ENCOUNTER — Inpatient Hospital Stay: Payer: Medicare Other

## 2021-08-14 ENCOUNTER — Inpatient Hospital Stay: Payer: Medicare Other | Attending: Hematology

## 2021-08-14 VITALS — BP 111/80 | HR 79 | Temp 97.7°F | Resp 17

## 2021-08-14 DIAGNOSIS — D500 Iron deficiency anemia secondary to blood loss (chronic): Secondary | ICD-10-CM

## 2021-08-14 DIAGNOSIS — K922 Gastrointestinal hemorrhage, unspecified: Secondary | ICD-10-CM | POA: Insufficient documentation

## 2021-08-14 LAB — CBC WITH DIFFERENTIAL (CANCER CENTER ONLY)
Abs Immature Granulocytes: 0.03 10*3/uL (ref 0.00–0.07)
Basophils Absolute: 0 10*3/uL (ref 0.0–0.1)
Basophils Relative: 1 %
Eosinophils Absolute: 0.2 10*3/uL (ref 0.0–0.5)
Eosinophils Relative: 3 %
HCT: 39.7 % (ref 36.0–46.0)
Hemoglobin: 12.9 g/dL (ref 12.0–15.0)
Immature Granulocytes: 1 %
Lymphocytes Relative: 21 %
Lymphs Abs: 1.3 10*3/uL (ref 0.7–4.0)
MCH: 34.3 pg — ABNORMAL HIGH (ref 26.0–34.0)
MCHC: 32.5 g/dL (ref 30.0–36.0)
MCV: 105.6 fL — ABNORMAL HIGH (ref 80.0–100.0)
Monocytes Absolute: 0.5 10*3/uL (ref 0.1–1.0)
Monocytes Relative: 8 %
Neutro Abs: 4.1 10*3/uL (ref 1.7–7.7)
Neutrophils Relative %: 66 %
Platelet Count: 196 10*3/uL (ref 150–400)
RBC: 3.76 MIL/uL — ABNORMAL LOW (ref 3.87–5.11)
RDW: 14 % (ref 11.5–15.5)
WBC Count: 6.2 10*3/uL (ref 4.0–10.5)
nRBC: 0 % (ref 0.0–0.2)

## 2021-08-14 LAB — IRON AND TIBC
Iron: 128 ug/dL (ref 41–142)
Saturation Ratios: 40 % (ref 21–57)
TIBC: 323 ug/dL (ref 236–444)
UIBC: 195 ug/dL (ref 120–384)

## 2021-08-14 LAB — CMP (CANCER CENTER ONLY)
ALT: 50 U/L — ABNORMAL HIGH (ref 0–44)
AST: 29 U/L (ref 15–41)
Albumin: 4 g/dL (ref 3.5–5.0)
Alkaline Phosphatase: 139 U/L — ABNORMAL HIGH (ref 38–126)
Anion gap: 9 (ref 5–15)
BUN: 14 mg/dL (ref 8–23)
CO2: 27 mmol/L (ref 22–32)
Calcium: 10.4 mg/dL — ABNORMAL HIGH (ref 8.9–10.3)
Chloride: 107 mmol/L (ref 98–111)
Creatinine: 0.8 mg/dL (ref 0.44–1.00)
GFR, Estimated: >60 mL/min (ref >60)
Glucose, Bld: 92 mg/dL (ref 70–99)
Potassium: 4.2 mmol/L (ref 3.5–5.1)
Sodium: 143 mmol/L (ref 135–145)
Total Bilirubin: 1.5 mg/dL — ABNORMAL HIGH (ref 0.3–1.2)
Total Protein: 6.9 g/dL (ref 6.5–8.1)

## 2021-08-14 LAB — FERRITIN: Ferritin: 513 ng/mL — ABNORMAL HIGH (ref 11–307)

## 2021-08-14 MED ORDER — SODIUM CHLORIDE 0.9 % IV SOLN
750.0000 mg | Freq: Once | INTRAVENOUS | Status: AC
  Administered 2021-08-14: 750 mg via INTRAVENOUS
  Filled 2021-08-14: qty 15

## 2021-08-14 MED ORDER — SODIUM CHLORIDE 0.9 % IV SOLN: Freq: Once | INTRAVENOUS | Status: AC

## 2021-08-14 NOTE — Patient Instructions (Signed)
Ferric Carboxymaltose Injection What is this medication? FERRIC CARBOXYMALTOSE (FER ik kar BOX ee MAWL tose) treats low levels of iron in your body (iron deficiency anemia). Iron is a mineral that plays an important role in making red blood cells, which carry oxygen from your lungs to the rest of your body. This medicine may be used for other purposes; ask your health care provider or pharmacist if you have questions. COMMON BRAND NAME(S): Injectafer What should I tell my care team before I take this medication? They need to know if you have any of these conditions: High blood pressure High levels of iron in the blood An unusual or allergic reaction to iron, other medications, foods, dyes, or preservatives Pregnant or trying to get pregnant Breast-feeding How should I use this medication? This medication is injected into a vein. It is given by your care team in a hospital or clinic setting. Talk to your care team about the use of this medication in children. While it may be given to children as young as 1 year for selected conditions, precautions do apply. Overdosage: If you think you have taken too much of this medicine contact a poison control center or emergency room at once. NOTE: This medicine is only for you. Do not share this medicine with others. What if I miss a dose? Keep appointments for follow-up doses. It is important not to miss your dose. Call your care team if you are unable to keep an appointment. What may interact with this medication? Do not take this medication with any of the following medications: Deferoxamine Dimercaprol Other iron products This list may not describe all possible interactions. Give your health care provider a list of all the medicines, herbs, non-prescription drugs, or dietary supplements you use. Also tell them if you smoke, drink alcohol, or use illegal drugs. Some items may interact with your medicine. What should I watch for while using this  medication? Visit your care team for regular checks on your progress. Tell your care team if your symptoms do not start to get better or if they get worse. You may need blood work while you are taking this medication. You may need to eat more foods that contain iron. Talk to your care team. Foods that contain iron include whole grains/cereals, dried fruits, beans, or peas, leafy green vegetables, and organ meats (liver, kidney). What side effects may I notice from receiving this medication? Side effects that you should report to your care team as soon as possible: Allergic reactions-skin rash, itching, hives, swelling of the face, lips, tongue, or throat Increase in blood pressure Low blood pressure-dizziness, feeling faint or lightheaded, blurry vision Shortness of breath Side effects that usually do not require medical attention (report to your care team if they continue or are bothersome): Flushing Headache Joint pain Muscle pain Nausea Pain, redness, or irritation at injection site This list may not describe all possible side effects. Call your doctor for medical advice about side effects. You may report side effects to FDA at 1-800-FDA-1088. Where should I keep my medication? This medication is given in a hospital or clinic. It will not be stored at home. NOTE: This sheet is a summary. It may not cover all possible information. If you have questions about this medicine, talk to your doctor, pharmacist, or health care provider.  2022 Elsevier/Gold Standard (2021-03-14 15:38:05)  

## 2021-08-14 NOTE — Progress Notes (Signed)
Pt declined to stay for post-observation period at this time. VS retaken and remained stable. Patient discharged in no acute distress.

## 2021-08-19 NOTE — Progress Notes (Signed)
Pt left message stating that since Sunday she has had generalized itching. Pt had iron infusion on 08/14/21 (Injectafer). Pt has had 6 doses of injectafer in 2022 so far with no prior issues. Spoke to pt and she denies any rash and states she has been taking Benadryl with some relief. Pt states she has had increased anxiety today and feels slightly nauseated. Per Dr Irene Limbo pt to try taking  Claritin. Pt stated that she has zyrtec and will take that.  Pt states allergist told her to take zyrtec. Pt to call back if she does not get any relief. Pt also encouraged to notify PCP if s/s get worse.

## 2021-08-22 DIAGNOSIS — Z23 Encounter for immunization: Secondary | ICD-10-CM | POA: Diagnosis not present

## 2021-08-26 DIAGNOSIS — M810 Age-related osteoporosis without current pathological fracture: Secondary | ICD-10-CM | POA: Diagnosis not present

## 2021-08-27 ENCOUNTER — Other Ambulatory Visit: Payer: Self-pay | Admitting: Obstetrics & Gynecology

## 2021-09-07 ENCOUNTER — Encounter: Payer: Self-pay | Admitting: Hematology

## 2021-09-09 DIAGNOSIS — F331 Major depressive disorder, recurrent, moderate: Secondary | ICD-10-CM | POA: Diagnosis not present

## 2021-09-10 ENCOUNTER — Ambulatory Visit (HOSPITAL_COMMUNITY)
Admission: RE | Admit: 2021-09-10 | Discharge: 2021-09-10 | Disposition: A | Discharge status: Home / Self Care | Payer: Medicare Other | Source: Ambulatory Visit | Attending: Obstetrics & Gynecology | Admitting: Obstetrics & Gynecology

## 2021-09-10 ENCOUNTER — Other Ambulatory Visit: Payer: Self-pay

## 2021-09-10 DIAGNOSIS — M810 Age-related osteoporosis without current pathological fracture: Secondary | ICD-10-CM | POA: Diagnosis not present

## 2021-09-10 MED ORDER — ZOLEDRONIC ACID 5 MG/100ML IV SOLN
INTRAVENOUS | Status: AC
  Filled 2021-09-10: qty 100

## 2021-09-10 MED ORDER — ZOLEDRONIC ACID 5 MG/100ML IV SOLN
5.0000 mg | Freq: Once | INTRAVENOUS | Status: AC
  Administered 2021-09-10: 5 mg via INTRAVENOUS

## 2021-09-12 DIAGNOSIS — Z23 Encounter for immunization: Secondary | ICD-10-CM | POA: Diagnosis not present

## 2021-09-24 DIAGNOSIS — D509 Iron deficiency anemia, unspecified: Secondary | ICD-10-CM | POA: Diagnosis not present

## 2021-09-24 DIAGNOSIS — K449 Diaphragmatic hernia without obstruction or gangrene: Secondary | ICD-10-CM | POA: Diagnosis not present

## 2021-09-24 DIAGNOSIS — K219 Gastro-esophageal reflux disease without esophagitis: Secondary | ICD-10-CM | POA: Diagnosis not present

## 2021-10-13 ENCOUNTER — Telehealth: Payer: Self-pay | Admitting: Orthopaedic Surgery

## 2021-10-13 ENCOUNTER — Ambulatory Visit (INDEPENDENT_AMBULATORY_CARE_PROVIDER_SITE_OTHER): Payer: Medicare Other

## 2021-10-13 ENCOUNTER — Ambulatory Visit (INDEPENDENT_AMBULATORY_CARE_PROVIDER_SITE_OTHER): Payer: Medicare Other | Admitting: Physician Assistant

## 2021-10-13 ENCOUNTER — Encounter: Payer: Self-pay | Admitting: Physician Assistant

## 2021-10-13 DIAGNOSIS — M25561 Pain in right knee: Secondary | ICD-10-CM

## 2021-10-13 NOTE — Telephone Encounter (Signed)
Pt called and states she slipped on some leaves and twisted her knee is very swollen. She is in a lot of pain and would really like to know if she can get in today or tomorrow.   CB [phone]

## 2021-10-13 NOTE — Progress Notes (Signed)
Office Visit Note   Patient: Molly Lucas           Date of Birth: 05-Apr-1946           MRN: [id number] Visit Date: 10/13/2021              Requested by: [name], MD [address] [place],  [place] [address] PCP: [name], MD  Chief Complaint  Patient presents with   Right Knee - Pain    DOI 10/12/2021      HPI: Patient is a pleasant 75 year old woman who last evening was getting out of bed and twisted her right knee.  She did not fall onto the knee.  She complains of posterior lateral knee pain.  She denies any instability.  She denies any locking or catching.  She denies hearing a pop at the time of the injury.  She has been treating this with ice  Assessment & Plan: Visit Diagnoses:  1. Acute pain of right knee     Plan: Twisting knee injury with lateral knee pain.  Patient does have degenerative changes within the knee joint.  Injury happened less than 24 hours ago.  I cannot appreciate an effusion.  She may have a degenerative tear of the posterior lateral meniscus or just an aggravation of her arthritic symptoms.  She will obtain a knee sleeve.  She does want to follow-up with Dr. Ninfa Linden who replaced her hip as she thinks the hip is just not right.  I told her that she can make an appointment to see him in a couple weeks he can reevaluate her knee at that time she should use her cane or assistive device as needed  Follow-Up Instructions: No follow-ups on file.   Ortho Exam  Patient is alert, oriented, no adenopathy, well-dressed, normal affect, normal respiratory effort. Examination of her right knee she has some swelling but I cannot appreciate any effusion.  No erythema or redness.  No tenderness over the medial joint line no tenderness over the patellofemoral joint she is able to extend her leg and held it without difficulty no tenderness in the patella tendon or quad tendon insert.  She has some tearing pain laterally over the posterior lateral  joint line no pain with stressing varus or valgus.  Imaging: XR KNEE 3 VIEW RIGHT  Result Date: 10/13/2021 Radiographed of her right knee were removed today.  Cannot appreciate any acute osseous injuries.  She does have tricompartmental arthritis with joint space narrowing more laterally than medially.  Patellofemoral joint is bone-on-bone arthritis with osteophyte at the superior pole which is fractured undetermined age.  She has some calcifications in the posterior compartment do not look new.  Sclerotic changes in the tibia both medially and lateral  No images are attached to the encounter.  Labs: Lab Results  Component Value Date   ESRSEDRATE 2 02/01/2018   ESRSEDRATE 31 (H) 07/03/2012   CRP 3.8 (H) 07/03/2012   REPTSTATUS 01/01/2021 FINAL 12/30/2020   GRAMSTAIN  07/05/2012    RARE WBC PRESENT, PREDOMINANTLY PMN NO SQUAMOUS EPITHELIAL CELLS SEEN NO ORGANISMS SEEN   CULT (A) 12/30/2020    <10,000 COLONIES/mL INSIGNIFICANT GROWTH Performed at [facility], [address], [place], [place] [address]      Lab Results  Component Value Date   ALBUMIN 4.0 08/14/2021   ALBUMIN 3.9 06/13/2021   ALBUMIN 3.7 05/14/2021    Lab Results  Component Value Date   MG 2.2 12/31/2020  MG 2.1 11/30/2015   No results found for: VD25OH  No results found for: PREALBUMIN CBC EXTENDED Latest Ref Rng & Units 08/14/2021 06/13/2021 05/14/2021  WBC 4.0 - 10.5 K/uL 6.2 5.9 5.8  RBC 3.87 - 5.11 MIL/uL 3.76(L) 4.51 4.61  HGB 12.0 - 15.0 g/dL 12.9 14.9 14.9  HCT 36.0 - 46.0 % 39.7 44.6 44.4  PLT 150 - 400 K/uL 196 151 174  NEUTROABS 1.7 - 7.7 K/uL 4.1 3.7 3.4  LYMPHSABS 0.7 - 4.0 K/uL 1.3 1.4 1.4     There is no height or weight on file to calculate BMI.  Orders:  Orders Placed This Encounter  Procedures   XR KNEE 3 VIEW RIGHT   No orders of the defined types were placed in this encounter.    Procedures: No procedures performed  Clinical Data: No additional  findings.  ROS:  All other systems negative, except as noted in the HPI. Review of Systems  Objective: Vital Signs: There were no vitals taken for this visit.  Specialty Comments:  No specialty comments available.  PMFS History: Patient Active Problem List   Diagnosis Date Noted   Hiatal hernia    Chronic gastric erosion    Essential hypertension 58/52/7782   Diastolic dysfunction 42/35/3614   Systolic murmur 43/15/4008   Red blood cell antibody positive 09/18/2019   GERD (gastroesophageal reflux disease) 05/19/2019   Depression 05/19/2019   History of gastric ulcer 03/29/2019   Insomnia 03/29/2019   Chronic gastrointestinal bleeding 03/29/2019   Excessive daytime sleepiness 06/20/2018   SOB (shortness of breath) on exertion 06/20/2018   Fatigue due to depression 06/20/2018   OSA (obstructive sleep apnea) 06/20/2018   Trigger thumb of right hand 04/29/2017   Iron deficiency anemia 04/09/2016   HLD (hyperlipidemia) 02/26/2016   Family history of heart disease 02/26/2016   Trivial Pericardial effusion 02/26/2016   Anemia 11/29/2015   Symptomatic anemia 11/29/2015   Dyspnea    Melena    Occult blood in stools    Primary osteoarthritis of first carpometacarpal joint of left hand 09/30/2015   Chronic pain of left thumb 08/01/2015   Recurrent right inguinal hernia 08/15/2013   Right inguinal hernia 03/14/2013   Degenerative arthritis of hip 06/24/2012   Past Medical History:  Diagnosis Date   Anemia    Takes iron supplement   Anxiety    Arthritis    Chronic pain of left thumb    Constipation    Deformity of left thumb joint    Z collapse deformity to nondominant left thumb   Depression    Fibroid, uterine    GERD (gastroesophageal reflux disease)    History of blood transfusion    Hx of seasonal allergies    Hyperlipemia    Hypertension    hx of off medication now.   OA (osteoarthritis) of hip    left   Sleep apnea    dentist made mouthipiece     Family  History  Problem Relation Age of Onset   Heart failure Mother    Osteoporosis Mother    Arthritis Mother    CAD Maternal Grandfather    Heart failure Maternal Grandfather    Dementia Maternal Grandfather    CAD Paternal Grandmother    CAD Paternal Grandfather    Arthritis Sister     Past Surgical History:  Procedure Laterality Date   BIOPSY  03/30/2019   Procedure: BIOPSY;  Surgeon: Thornton Park, MD;  Location: [facility];  Service: Gastroenterology;;  CARPOMETACARPEL SUSPENSION PLASTY  08/2015   with abductor pollicis longus tendon transfer and suspensionplasty   COLONOSCOPY WITH PROPOFOL N/A 03/31/2019   Procedure: COLONOSCOPY WITH PROPOFOL;  Surgeon: Thornton Park, MD;  Location: [facility];  Service: Gastroenterology;  Laterality: N/A;   DE QUERVAIN'S RELEASE  08/2015   DILATATION & CURRETTAGE/HYSTEROSCOPY WITH RESECTOCOPE  2007   ESOPHAGOGASTRODUODENOSCOPY N/A 11/30/2015   Procedure: ESOPHAGOGASTRODUODENOSCOPY (EGD);  Surgeon: Richmond Campbell, MD;  Location: [facility];  Service: Endoscopy;  Laterality: N/A;   ESOPHAGOGASTRODUODENOSCOPY (EGD) WITH PROPOFOL N/A 03/30/2019   Procedure: ESOPHAGOGASTRODUODENOSCOPY (EGD) WITH PROPOFOL;  Surgeon: Thornton Park, MD;  Location: [facility];  Service: Gastroenterology;  Laterality: N/A;   ESOPHAGOGASTRODUODENOSCOPY (EGD) WITH PROPOFOL N/A 12/31/2020   Procedure: ESOPHAGOGASTRODUODENOSCOPY (EGD) WITH PROPOFOL;  Surgeon: Irene Shipper, MD;  Location: [facility];  Service: Endoscopy;  Laterality: N/A;   HERNIA REPAIR Left 06/2012   INGUINAL HERNIA REPAIR Right 05/03/2013   Procedure: HERNIA REPAIR INGUINAL ADULT;  Surgeon: Harl Bowie, MD;  Location: [facility];  Service: General;  Laterality: Right;   INGUINAL HERNIA REPAIR Right 08/23/2013   Procedure: LAPAROSCOPIC INGUINAL HERNIA;  Surgeon: Harl Bowie, MD;  Location: [facility];  Service: General;  Laterality: Right;   INSERTION OF MESH Right 05/03/2013   Procedure:  INSERTION OF MESH;  Surgeon: Harl Bowie, MD;  Location: [facility];  Service: General;  Laterality: Right;   INSERTION OF MESH Right 08/23/2013   Procedure: INSERTION OF MESH;  Surgeon: Harl Bowie, MD;  Location: [facility];  Service: General;  Laterality: Right;   METACARPOPHALANGEAL JOINT ARTHRODESIS  08/2015   with local bone graft   TONSILLECTOMY     TOTAL HIP ARTHROPLASTY  06/24/2012   Procedure: TOTAL HIP ARTHROPLASTY ANTERIOR APPROACH;  Surgeon: Mcarthur Rossetti, MD;  Location: [facility];  Service: Orthopedics;  Laterality: Left;  Left total hip arthroplasty, anterior approach   UTERINE FIBROID SURGERY  2007   WISDOM TOOTH EXTRACTION     Social History   Occupational History   Not on file  Tobacco Use   Smoking status: Never   Smokeless tobacco: Never  Vaping Use   Vaping Use: Never used  Substance and Sexual Activity   Alcohol use: Yes    Alcohol/week: 2.0 standard drinks    Types: 2 Glasses of wine per week    Comment: occasional glass of wine    Drug use: No   Sexual activity: Not on file

## 2021-10-14 ENCOUNTER — Inpatient Hospital Stay: Payer: Medicare Other | Attending: Hematology

## 2021-10-14 ENCOUNTER — Inpatient Hospital Stay: Payer: Medicare Other

## 2021-10-14 ENCOUNTER — Other Ambulatory Visit: Payer: Self-pay

## 2021-10-14 ENCOUNTER — Inpatient Hospital Stay (HOSPITAL_BASED_OUTPATIENT_CLINIC_OR_DEPARTMENT_OTHER): Payer: Medicare Other | Admitting: Hematology

## 2021-10-14 VITALS — BP 150/92 | HR 62 | Temp 98.5°F | Resp 18

## 2021-10-14 VITALS — BP 128/93 | HR 88 | Temp 97.7°F | Resp 18 | Ht 62.0 in | Wt 202.6 lb

## 2021-10-14 DIAGNOSIS — D500 Iron deficiency anemia secondary to blood loss (chronic): Secondary | ICD-10-CM

## 2021-10-14 LAB — CMP (CANCER CENTER ONLY)
ALT: 39 U/L (ref 0–44)
AST: 26 U/L (ref 15–41)
Albumin: 4 g/dL (ref 3.5–5.0)
Alkaline Phosphatase: 108 U/L (ref 38–126)
Anion gap: 10 (ref 5–15)
BUN: 16 mg/dL (ref 8–23)
CO2: 24 mmol/L (ref 22–32)
Calcium: 9.8 mg/dL (ref 8.9–10.3)
Chloride: 110 mmol/L (ref 98–111)
Creatinine: 0.85 mg/dL (ref 0.44–1.00)
GFR, Estimated: >60 mL/min (ref >60)
Glucose, Bld: 105 mg/dL — ABNORMAL HIGH (ref 70–99)
Potassium: 4.1 mmol/L (ref 3.5–5.1)
Sodium: 144 mmol/L (ref 135–145)
Total Bilirubin: 0.9 mg/dL (ref 0.3–1.2)
Total Protein: 7.1 g/dL (ref 6.5–8.1)

## 2021-10-14 LAB — IRON AND TIBC
Iron: 114 ug/dL (ref 41–142)
Saturation Ratios: 37 % (ref 21–57)
TIBC: 308 ug/dL (ref 236–444)
UIBC: 194 ug/dL (ref 120–384)

## 2021-10-14 LAB — CBC WITH DIFFERENTIAL (CANCER CENTER ONLY)
Abs Immature Granulocytes: 0.03 10*3/uL (ref 0.00–0.07)
Basophils Absolute: 0.1 10*3/uL (ref 0.0–0.1)
Basophils Relative: 1 %
Eosinophils Absolute: 0.2 10*3/uL (ref 0.0–0.5)
Eosinophils Relative: 4 %
HCT: 45.3 % (ref 36.0–46.0)
Hemoglobin: 15.4 g/dL — ABNORMAL HIGH (ref 12.0–15.0)
Immature Granulocytes: 1 %
Lymphocytes Relative: 23 %
Lymphs Abs: 1.2 10*3/uL (ref 0.7–4.0)
MCH: 33.6 pg (ref 26.0–34.0)
MCHC: 34 g/dL (ref 30.0–36.0)
MCV: 98.7 fL (ref 80.0–100.0)
Monocytes Absolute: 0.5 10*3/uL (ref 0.1–1.0)
Monocytes Relative: 9 %
Neutro Abs: 3.3 10*3/uL (ref 1.7–7.7)
Neutrophils Relative %: 62 %
Platelet Count: 187 10*3/uL (ref 150–400)
RBC: 4.59 MIL/uL (ref 3.87–5.11)
RDW: 12.4 % (ref 11.5–15.5)
WBC Count: 5.3 10*3/uL (ref 4.0–10.5)
nRBC: 0 % (ref 0.0–0.2)

## 2021-10-14 LAB — FERRITIN: Ferritin: 469 ng/mL — ABNORMAL HIGH (ref 11–307)

## 2021-10-14 MED ORDER — SODIUM CHLORIDE 0.9 % IV SOLN
750.0000 mg | Freq: Once | INTRAVENOUS | Status: AC
  Administered 2021-10-14: 750 mg via INTRAVENOUS
  Filled 2021-10-14: qty 15

## 2021-10-14 MED ORDER — SODIUM CHLORIDE 0.9 % IV SOLN: Freq: Once | INTRAVENOUS | Status: AC

## 2021-10-14 NOTE — Patient Instructions (Signed)
Ferric Carboxymaltose Injection °What is this medication? °FERRIC CARBOXYMALTOSE (FER ik kar BOX ee MAWL tose) treats low levels of iron in your body (iron deficiency anemia). Iron is a mineral that plays an important role in making red blood cells, which carry oxygen from your lungs to the rest of your body. °This medicine may be used for other purposes; ask your health care provider or pharmacist if you have questions. °COMMON BRAND NAME(S): Injectafer °What should I tell my care team before I take this medication? °They need to know if you have any of these conditions: °High blood pressure °High levels of iron in the blood °An unusual or allergic reaction to iron, other medications, foods, dyes, or preservatives °Pregnant or trying to get pregnant °Breast-feeding °How should I use this medication? °This medication is injected into a vein. It is given by your care team in a hospital or clinic setting. °Talk to your care team about the use of this medication in children. While it may be given to children as young as 1 year for selected conditions, precautions do apply. °Overdosage: If you think you have taken too much of this medicine contact a poison control center or emergency room at once. °NOTE: This medicine is only for you. Do not share this medicine with others. °What if I miss a dose? °Keep appointments for follow-up doses. It is important not to miss your dose. Call your care team if you are unable to keep an appointment. °What may interact with this medication? °Do not take this medication with any of the following medications: °Deferoxamine °Dimercaprol °Other iron products °This list may not describe all possible interactions. Give your health care provider a list of all the medicines, herbs, non-prescription drugs, or dietary supplements you use. Also tell them if you smoke, drink alcohol, or use illegal drugs. Some items may interact with your medicine. °What should I watch for while using this  medication? °Visit your care team for regular checks on your progress. Tell your care team if your symptoms do not start to get better or if they get worse. °You may need blood work while you are taking this medication. °You may need to eat more foods that contain iron. Talk to your care team. Foods that contain iron include whole grains/cereals, dried fruits, beans, or peas, leafy green vegetables, and organ meats (liver, kidney). °What side effects may I notice from receiving this medication? °Side effects that you should report to your care team as soon as possible: °Allergic reactions--skin rash, itching, hives, swelling of the face, lips, tongue, or throat °Increase in blood pressure °Low blood pressure--dizziness, feeling faint or lightheaded, blurry vision °Shortness of breath °Side effects that usually do not require medical attention (report to your care team if they continue or are bothersome): °Flushing °Headache °Joint pain °Muscle pain °Nausea °Pain, redness, or irritation at injection site °This list may not describe all possible side effects. Call your doctor for medical advice about side effects. You may report side effects to FDA at 1-800-FDA-1088. °Where should I keep my medication? °This medication is given in a hospital or clinic. It will not be stored at home. °NOTE: This sheet is a summary. It may not cover all possible information. If you have questions about this medicine, talk to your doctor, pharmacist, or health care provider. °© 2022 [employer] (2021-03-21 00:00:00) ° °

## 2021-10-15 ENCOUNTER — Telehealth: Payer: Self-pay | Admitting: Hematology

## 2021-10-15 NOTE — Telephone Encounter (Signed)
Scheduled follow-up appointments per 12/6 los. Patient is aware.

## 2021-10-20 ENCOUNTER — Encounter: Payer: Self-pay | Admitting: Hematology

## 2021-10-20 NOTE — Progress Notes (Signed)
HEMATOLOGY/ONCOLOGY CLINIC NOTE  Date of Service: .10/14/2021   Patient Care Team: [name], MD as PCP - General (Internal Medicine)  CHIEF COMPLAINTS/PURPOSE OF CONSULTATION:  Follow-up for continued management of iron deficiency anemia   HISTORY OF PRESENTING ILLNESS:  Please see previous notes for details on initial presentation.  INTERVAL HISTORY:  Molly Lucas is a 75 y.o. female here for continued management of iron deficiency anemia related to her ongoing intermittent GI losses . She was last seen in clinic by me on 05/14/2021 and has been receiving IV Injectafer 750 mg every 1-2 months. She notes dark stools couple of times a month but notes that overall her GI bleeding does not seem to be as much as previously. Continues to be on PPI. Follows with Richmond Campbell MD for her gastroenterology cares. No issues with tolerating IV iron. Notes good energy levels. Labs today reviewed in detail with the patient.  MEDICAL HISTORY:  Past Medical History:  Diagnosis Date   Anemia    Takes iron supplement   Anxiety    Arthritis    Chronic pain of left thumb    Constipation    Deformity of left thumb joint    Z collapse deformity to nondominant left thumb   Depression    Fibroid, uterine    GERD (gastroesophageal reflux disease)    History of blood transfusion    Hx of seasonal allergies    Hyperlipemia    Hypertension    hx of off medication now.   OA (osteoarthritis) of hip    left   Sleep apnea    dentist made mouthipiece     SURGICAL HISTORY: Past Surgical History:  Procedure Laterality Date   BIOPSY  03/30/2019   Procedure: BIOPSY;  Surgeon: Thornton Park, MD;  Location: [place];  Service: Gastroenterology;;   [place]  08/2015   with abductor pollicis longus tendon transfer and suspensionplasty   COLONOSCOPY WITH PROPOFOL N/A 03/31/2019   Procedure: COLONOSCOPY WITH PROPOFOL;  Surgeon: Thornton Park, MD;   Location: [facility];  Service: Gastroenterology;  Laterality: N/A;   DE QUERVAIN'S RELEASE  08/2015   DILATATION & CURRETTAGE/HYSTEROSCOPY WITH RESECTOCOPE  2007   ESOPHAGOGASTRODUODENOSCOPY N/A 11/30/2015   Procedure: ESOPHAGOGASTRODUODENOSCOPY (EGD);  Surgeon: Richmond Campbell, MD;  Location: [facility];  Service: Endoscopy;  Laterality: N/A;   ESOPHAGOGASTRODUODENOSCOPY (EGD) WITH PROPOFOL N/A 03/30/2019   Procedure: ESOPHAGOGASTRODUODENOSCOPY (EGD) WITH PROPOFOL;  Surgeon: Thornton Park, MD;  Location: [facility];  Service: Gastroenterology;  Laterality: N/A;   ESOPHAGOGASTRODUODENOSCOPY (EGD) WITH PROPOFOL N/A 12/31/2020   Procedure: ESOPHAGOGASTRODUODENOSCOPY (EGD) WITH PROPOFOL;  Surgeon: Irene Shipper, MD;  Location: [facility];  Service: Endoscopy;  Laterality: N/A;   HERNIA REPAIR Left 06/2012   INGUINAL HERNIA REPAIR Right 05/03/2013   Procedure: HERNIA REPAIR INGUINAL ADULT;  Surgeon: Harl Bowie, MD;  Location: [place];  Service: General;  Laterality: Right;   INGUINAL HERNIA REPAIR Right 08/23/2013   Procedure: LAPAROSCOPIC INGUINAL HERNIA;  Surgeon: Harl Bowie, MD;  Location: [place];  Service: General;  Laterality: Right;   INSERTION OF MESH Right 05/03/2013   Procedure: INSERTION OF MESH;  Surgeon: Harl Bowie, MD;  Location: [place] [facility];  Service: General;  Laterality: Right;   INSERTION OF MESH Right 08/23/2013   Procedure: INSERTION OF MESH;  Surgeon: Harl Bowie, MD;  Location: [facility];  Service: General;  Laterality: Right;   METACARPOPHALANGEAL JOINT ARTHRODESIS  08/2015   with local bone graft  TONSILLECTOMY     TOTAL HIP ARTHROPLASTY  06/24/2012   Procedure: TOTAL HIP ARTHROPLASTY ANTERIOR APPROACH;  Surgeon: Mcarthur Rossetti, MD;  Location: [facility];  Service: Orthopedics;  Laterality: Left;  Left total hip arthroplasty, anterior approach   UTERINE FIBROID SURGERY  2007   WISDOM TOOTH EXTRACTION      SOCIAL HISTORY: Social History    Socioeconomic History   Marital status: Divorced    Spouse name: Not on file   Number of children: Not on file   Years of education: Not on file   Highest education level: Not on file  Occupational History   Not on file  Tobacco Use   Smoking status: Never   Smokeless tobacco: Never  Vaping Use   Vaping Use: Never used  Substance and Sexual Activity   Alcohol use: Yes    Alcohol/week: 2.0 standard drinks    Types: 2 Glasses of wine per week    Comment: occasional glass of wine    Drug use: No   Sexual activity: Not on file  Other Topics Concern   Not on file  Social History Narrative   Not on file   Social Determinants of Health   Financial Resource Strain: Not on file  Food Insecurity: Not on file  Transportation Needs: Not on file  Physical Activity: Not on file  Stress: Not on file  Social Connections: Not on file  Intimate Partner Violence: Not on file    FAMILY HISTORY: Family History  Problem Relation Age of Onset   Heart failure Mother    Osteoporosis Mother    Arthritis Mother    CAD Maternal Grandfather    Heart failure Maternal Grandfather    Dementia Maternal Grandfather    CAD Paternal Grandmother    CAD Paternal Grandfather    Arthritis Sister     ALLERGIES:  is allergic to penicillins.  MEDICATIONS:  Current Outpatient Medications  Medication Sig Dispense Refill   CALCIUM PO Take 2 tablets by mouth daily.      cetirizine (ZYRTEC) 10 MG tablet Take 10 mg by mouth daily.     Cholecalciferol (VITAMIN D3) 250 MCG (10000 UT) TABS Take 10,000 Units by mouth daily.      CYANOCOBALAMIN PO Take 1 tablet by mouth daily.     NON FORMULARY Take 1 capsule by mouth See admin instructions. Black Cumi[place] oil capsules: Take 1 capsule by mouth once a day     Omega-3 1000 MG CAPS Take 2,000 mg by mouth daily.      pantoprazole (PROTONIX) 40 MG tablet 20 mg.     pravastatin (PRAVACHOL) 20 MG tablet Take 20 mg by mouth at bedtime.      Turmeric 500 MG CAPS  Take 500 mg by mouth daily. Tumeric with cucumin     VIIBRYD 40 MG TABS Take 40 mg by mouth daily.     vitamin C (ASCORBIC ACID) 500 MG tablet Take 500 mg by mouth daily.     No current facility-administered medications for this visit.    REVIEW OF SYSTEMS: .10 Point review of Systems was done is negative except as noted above.  PHYSICAL EXAMINATION: ECOG FS:2 - Symptomatic, <50% confined to bed  Vitals:   10/14/21 1421  BP: (!) 128/93  Pulse: 88  Resp: 18  Temp: 97.7 F (36.5 C)  SpO2: 98%    Wt Readings from Last 3 Encounters:  10/14/21 202 lb 9.6 oz (91.9 kg)  05/14/21 205 lb 8 oz (93.2  kg)  03/13/21 186 lb (84.4 kg)   Body mass index is 37.06 kg/m.   Marland Kitchen GENERAL:alert, in no acute distress and comfortable SKIN: no acute rashes, no significant lesions EYES: conjunctiva are pink and non-injected, sclera anicteric OROPHARYNX: MMM, no exudates, no oropharyngeal erythema or ulceration NECK: supple, no JVD LYMPH:  no palpable lymphadenopathy in the cervical, axillary or inguinal regions LUNGS: clear to auscultation b/l with normal respiratory effort HEART: regular rate & rhythm ABDOMEN:  normoactive bowel sounds , non tender, not distended. Extremity: no pedal edema PSYCH: alert & oriented x 3 with fluent speech NEURO: no focal motor/sensory deficits   LABORATORY DATA:  I have reviewed the data as listed  . CBC Latest Ref Rng & Units 10/14/2021 08/14/2021 06/13/2021  WBC 4.0 - 10.5 K/uL 5.3 6.2 5.9  Hemoglobin 12.0 - 15.0 g/dL 15.4(H) 12.9 14.9  Hematocrit 36.0 - 46.0 % 45.3 39.7 44.6  Platelets 150 - 400 K/uL 187 196 151    . CMP Latest Ref Rng & Units 10/14/2021 08/14/2021 06/13/2021  Glucose 70 - 99 mg/dL 105(H) 92 128(H)  BUN 8 - 23 mg/dL 16 14 18   Creatinine 0.44 - 1.00 mg/dL 0.85 0.80 0.81  Sodium 135 - 145 mmol/L 144 143 139  Potassium 3.5 - 5.1 mmol/L 4.1 4.2 4.1  Chloride 98 - 111 mmol/L 110 107 107  CO2 22 - 32 mmol/L 24 27 25   Calcium 8.9 - 10.3 mg/dL  9.8 10.4(H) 10.4(H)  Total Protein 6.5 - 8.1 g/dL 7.1 6.9 6.7  Total Bilirubin 0.3 - 1.2 mg/dL 0.9 1.5(H) 1.6(H)  Alkaline Phos 38 - 126 U/L 108 139(H) 143(H)  AST 15 - 41 U/L 26 29 31   ALT 0 - 44 U/L 39 50(H) 65(H)   . Lab Results  Component Value Date   IRON 114 10/14/2021   TIBC 308 10/14/2021   IRONPCTSAT 37 10/14/2021   (Iron and TIBC)  Lab Results  Component Value Date   FERRITIN 469 (H) 10/14/2021   Component     Latest Ref Rng & Units 02/01/2018  IgG (Immunoglobin G), Serum     700 - 1,600 mg/dL 864  IgA     64 - 422 mg/dL 194  IgM (Immunoglobulin M), Srm     26 - 217 mg/dL 71  Total Protein ELP     6.0 - 8.5 g/dL 6.8  Albumin SerPl Elph-Mcnc     2.9 - 4.4 g/dL 3.8  Alpha 1     0.0 - 0.4 g/dL 0.3  Alpha2 Glob SerPl Elph-Mcnc     0.4 - 1.0 g/dL 0.7  B-Globulin SerPl Elph-Mcnc     0.7 - 1.3 g/dL 1.1  Gamma Glob SerPl Elph-Mcnc     0.4 - 1.8 g/dL 0.9  M Protein SerPl Elph-Mcnc     Not Observed g/dL Not Observed  Globulin, Total     2.2 - 3.9 g/dL 3.0  Albumin/Glob SerPl     0.7 - 1.7 1.3  IFE 1      Comment  Please Note (HCV):      Comment  PTH, Intact     15 - 65 pg/mL 56  Calcium, Total (PTH)     8.7 - 10.3 mg/dL 10.9 (H)  PTH Interp      Comment  Kappa free light chain     3.3 - 19.4 mg/L 23.4 (H)  Lamda free light chains     5.7 - 26.3 mg/L 10.3  Kappa, lamda light chain ratio  0.26 - 1.65 2.27 (H)  Beta-2 Microglobulin     0.6 - 2.4 mg/L 2.9 (H)  Sed Rate     0 - 22 mm/hr 2  LDH     125 - 245 U/L 169    RADIOGRAPHIC STUDIES: I have personally reviewed the radiological images as listed and agreed with the findings in the report. XR KNEE 3 VIEW RIGHT  Result Date: 10/13/2021 Radiographed of her right knee were removed today.  Cannot appreciate any acute osseous injuries.  She does have tricompartmental arthritis with joint space narrowing more laterally than medially.  Patellofemoral joint is bone-on-bone arthritis with osteophyte at  the superior pole which is fractured undetermined age.  She has some calcifications in the posterior compartment do not look new.  Sclerotic changes in the tibia both medially and lateral   ASSESSMENT & PLAN:   Makenli Derstine is a 75 y.o. Caucasian female with  1. Severe iron deficiency Anemia due to GI bleeding  2. Hiatal Hernia with h/o cameron ulcers with previous recurrent Iron deficiency needing IV iron replacement in the past. -Found on GI workup and likely cause of GI bleeding leading to anemia.    Lab Results  Component Value Date   IRON 114 10/14/2021   TIBC 308 10/14/2021   IRONPCTSAT 37 10/14/2021   (Iron and TIBC)  Lab Results  Component Value Date   FERRITIN 469 (H) 10/14/2021    3. H/o Elevated Calcium at 11.7 rpt today improved to 11 PTH - WNL Myeloma panel - no M spike and neg IFE K/L ratio minimally abnormal but only minimal increase in Kappa light chains B2MG  -borderline elevated.   PLAN:  -I discussed patient's labs with her.  Her hemoglobin currently is 15.4 with normal WBC count and platelets. CMP within normal limits Ferritin 469 with an iron saturation of 37%. -Patient to proceed with her planned IV Injectafer today. -We shall switch her IV Injectafer to every 3 months.  FOLLOW UP: Please schedule for labs and IV Injectafer every 3 months x 4 doses MD visit in 6 months  . The total time spent in the appointment was 20 minutes and more than 50% was on counseling and direct patient cares, ordering and management of IV iron   All of the patient's questions were answered with apparent satisfaction. The patient knows to call the clinic with any problems, questions or concerns.    Sullivan Lone MD [place] AAHIVMS [facility] Hematology/Oncology Physician [facility]

## 2021-10-27 ENCOUNTER — Ambulatory Visit: Payer: Medicare Other | Admitting: Physician Assistant

## 2021-11-03 ENCOUNTER — Encounter (HOSPITAL_COMMUNITY): Payer: Self-pay

## 2021-11-03 ENCOUNTER — Observation Stay (HOSPITAL_COMMUNITY)
Admission: EM | Admit: 2021-11-03 | Discharge: 2021-11-04 | Disposition: A | Discharge status: Home / Self Care | Payer: Medicare Other | Attending: Family Medicine | Admitting: Family Medicine

## 2021-11-03 ENCOUNTER — Other Ambulatory Visit: Payer: Self-pay

## 2021-11-03 DIAGNOSIS — K449 Diaphragmatic hernia without obstruction or gangrene: Secondary | ICD-10-CM | POA: Insufficient documentation

## 2021-11-03 DIAGNOSIS — K922 Gastrointestinal hemorrhage, unspecified: Secondary | ICD-10-CM | POA: Diagnosis present

## 2021-11-03 DIAGNOSIS — K921 Melena: Secondary | ICD-10-CM | POA: Diagnosis not present

## 2021-11-03 DIAGNOSIS — G4733 Obstructive sleep apnea (adult) (pediatric): Secondary | ICD-10-CM | POA: Diagnosis not present

## 2021-11-03 DIAGNOSIS — Z20822 Contact with and (suspected) exposure to covid-19: Secondary | ICD-10-CM | POA: Insufficient documentation

## 2021-11-03 DIAGNOSIS — K219 Gastro-esophageal reflux disease without esophagitis: Secondary | ICD-10-CM | POA: Diagnosis present

## 2021-11-03 DIAGNOSIS — Q399 Congenital malformation of esophagus, unspecified: Secondary | ICD-10-CM | POA: Diagnosis not present

## 2021-11-03 DIAGNOSIS — I10 Essential (primary) hypertension: Secondary | ICD-10-CM | POA: Diagnosis not present

## 2021-11-03 DIAGNOSIS — K625 Hemorrhage of anus and rectum: Secondary | ICD-10-CM | POA: Diagnosis not present

## 2021-11-03 DIAGNOSIS — F32A Depression, unspecified: Secondary | ICD-10-CM | POA: Diagnosis present

## 2021-11-03 DIAGNOSIS — I5189 Other ill-defined heart diseases: Secondary | ICD-10-CM

## 2021-11-03 DIAGNOSIS — K259 Gastric ulcer, unspecified as acute or chronic, without hemorrhage or perforation: Secondary | ICD-10-CM | POA: Diagnosis not present

## 2021-11-03 DIAGNOSIS — Z79899 Other long term (current) drug therapy: Secondary | ICD-10-CM | POA: Insufficient documentation

## 2021-11-03 DIAGNOSIS — E785 Hyperlipidemia, unspecified: Secondary | ICD-10-CM | POA: Diagnosis present

## 2021-11-03 LAB — COMPREHENSIVE METABOLIC PANEL
ALT: 47 U/L — ABNORMAL HIGH (ref 0–44)
AST: 31 U/L (ref 15–41)
Albumin: 4.2 g/dL (ref 3.5–5.0)
Alkaline Phosphatase: 88 U/L (ref 38–126)
Anion gap: 6 (ref 5–15)
BUN: 17 mg/dL (ref 8–23)
CO2: 24 mmol/L (ref 22–32)
Calcium: 9.6 mg/dL (ref 8.9–10.3)
Chloride: 108 mmol/L (ref 98–111)
Creatinine, Ser: 0.61 mg/dL (ref 0.44–1.00)
GFR, Estimated: >60 mL/min (ref >60)
Glucose, Bld: 115 mg/dL — ABNORMAL HIGH (ref 70–99)
Potassium: 3.5 mmol/L (ref 3.5–5.1)
Sodium: 138 mmol/L (ref 135–145)
Total Bilirubin: 1.3 mg/dL — ABNORMAL HIGH (ref 0.3–1.2)
Total Protein: 7 g/dL (ref 6.5–8.1)

## 2021-11-03 LAB — CBC WITH DIFFERENTIAL/PLATELET
Abs Immature Granulocytes: 0.02 10*3/uL (ref 0.00–0.07)
Basophils Absolute: 0.1 10*3/uL (ref 0.0–0.1)
Basophils Relative: 1 %
Eosinophils Absolute: 0.2 10*3/uL (ref 0.0–0.5)
Eosinophils Relative: 4 %
HCT: 41.2 % (ref 36.0–46.0)
Hemoglobin: 13.7 g/dL (ref 12.0–15.0)
Immature Granulocytes: 0 %
Lymphocytes Relative: 25 %
Lymphs Abs: 1.5 10*3/uL (ref 0.7–4.0)
MCH: 32.9 pg (ref 26.0–34.0)
MCHC: 33.3 g/dL (ref 30.0–36.0)
MCV: 99 fL (ref 80.0–100.0)
Monocytes Absolute: 0.6 10*3/uL (ref 0.1–1.0)
Monocytes Relative: 9 %
Neutro Abs: 3.8 10*3/uL (ref 1.7–7.7)
Neutrophils Relative %: 61 %
Platelets: 201 10*3/uL (ref 150–400)
RBC: 4.16 MIL/uL (ref 3.87–5.11)
RDW: 13.1 % (ref 11.5–15.5)
WBC: 6.2 10*3/uL (ref 4.0–10.5)
nRBC: 0 % (ref 0.0–0.2)

## 2021-11-03 LAB — I-STAT CHEM 8, ED
BUN: 17 mg/dL (ref 8–23)
Calcium, Ion: 1.31 mmol/L (ref 1.15–1.40)
Chloride: 107 mmol/L (ref 98–111)
Creatinine, Ser: 0.6 mg/dL (ref 0.44–1.00)
Glucose, Bld: 109 mg/dL — ABNORMAL HIGH (ref 70–99)
HCT: 42 % (ref 36.0–46.0)
Hemoglobin: 14.3 g/dL (ref 12.0–15.0)
Potassium: 3.6 mmol/L (ref 3.5–5.1)
Sodium: 141 mmol/L (ref 135–145)
TCO2: 25 mmol/L (ref 22–32)

## 2021-11-03 LAB — TYPE AND SCREEN
ABO/RH(D): O POS
Antibody Screen: NEGATIVE

## 2021-11-03 LAB — RESP PANEL BY RT-PCR (FLU A&B, COVID) ARPGX2
Influenza A by PCR: NEGATIVE
Influenza B by PCR: NEGATIVE
SARS Coronavirus 2 by RT PCR: NEGATIVE

## 2021-11-03 LAB — HEMOGLOBIN AND HEMATOCRIT, BLOOD
HCT: 40 % (ref 36.0–46.0)
Hemoglobin: 13 g/dL (ref 12.0–15.0)

## 2021-11-03 LAB — POC OCCULT BLOOD, ED: Fecal Occult Bld: POSITIVE — AB

## 2021-11-03 MED ORDER — ONDANSETRON HCL 4 MG PO TABS: 4.0000 mg | ORAL_TABLET | Freq: Four times a day (QID) | ORAL | Status: DC | PRN

## 2021-11-03 MED ORDER — ACETAMINOPHEN 650 MG RE SUPP: 650.0000 mg | Freq: Four times a day (QID) | RECTAL | Status: DC | PRN

## 2021-11-03 MED ORDER — PANTOPRAZOLE SODIUM 40 MG IV SOLR
40.0000 mg | Freq: Two times a day (BID) | INTRAVENOUS | Status: DC
  Administered 2021-11-03 – 2021-11-04 (×2): 40 mg via INTRAVENOUS
  Filled 2021-11-03 (×2): qty 40

## 2021-11-03 MED ORDER — LORATADINE 10 MG PO TABS
10.0000 mg | ORAL_TABLET | Freq: Every day | ORAL | Status: DC
  Administered 2021-11-03: 10 mg via ORAL
  Filled 2021-11-03: qty 1

## 2021-11-03 MED ORDER — LACTATED RINGERS IV SOLN: INTRAVENOUS | Status: DC

## 2021-11-03 MED ORDER — PRAVASTATIN SODIUM 20 MG PO TABS
20.0000 mg | ORAL_TABLET | Freq: Every day | ORAL | Status: DC
  Administered 2021-11-03: 22:00:00 20 mg via ORAL
  Filled 2021-11-03: qty 1

## 2021-11-03 MED ORDER — LORATADINE 10 MG PO TABS: 10.0000 mg | ORAL_TABLET | Freq: Every day | ORAL | Status: DC

## 2021-11-03 MED ORDER — ONDANSETRON HCL 4 MG/2ML IJ SOLN: 4.0000 mg | Freq: Four times a day (QID) | INTRAMUSCULAR | Status: DC | PRN

## 2021-11-03 MED ORDER — ACETAMINOPHEN 325 MG PO TABS: 650.0000 mg | ORAL_TABLET | Freq: Four times a day (QID) | ORAL | Status: DC | PRN

## 2021-11-03 MED ORDER — VILAZODONE HCL 20 MG PO TABS
40.0000 mg | ORAL_TABLET | Freq: Every day | ORAL | Status: DC
  Filled 2021-11-03 (×2): qty 2

## 2021-11-03 MED ORDER — LACTATED RINGERS IV BOLUS
1000.0000 mL | Freq: Once | INTRAVENOUS | Status: AC
  Administered 2021-11-03: 15:00:00 1000 mL via INTRAVENOUS

## 2021-11-03 NOTE — Progress Notes (Signed)
[facility] admitting physician addendum:  The patient declined to use one of our CPAP machines for sleep apnea. I also advised the patient to discontinue turmeric/curcumin since this supplement may have blood thinning properties.  Tennis Must, MD.

## 2021-11-03 NOTE — H&P (Signed)
History and Physical    Molly Lucas [facility] TFT:[id number] DOB: [birth year] DOA: 11/03/2021  PCP: [name], MD   Patient coming from: Home.  I have personally briefly reviewed patient's old medical records in [place]  Chief Complaint: Melena and dizziness.  HPI: Molly Lucas is a 75 y.o. female with medical history significant of anemia, anxiety,*Tritus, chronic pain of the left thumb, constipation, depression, uterine fibroids, GERD, seasonal allergies, hypertension, hyperlipidemia, osteoarthritis of the left hip, sleep apnea who is coming to the emergency department due to multiple episodes of melena in the last few days associated with progressively worse postural dizziness.  Denied abdominal pain, constipation, nausea or emesis or hematochezia.  She denied fever, chills, sore throat, rhinorrhea, dyspnea, wheezing or hemoptysis.  No chest pain, palpitations, diaphoresis, PND, orthopnea or recent pitting edema of the lower extremities.  No dysuria, frequency or hematuria.  ED Course: Initial vital signs temperature 97.9 F, pulse 77, respiration 18, BP 136/94 mmHg O2 sat 98% on room air.  The patient was given a 1000 mL of LR bolus.  Lab work: Her CBC is her white count 6.2, hemoglobin 13.7 g/dL platelets 201.  CMP showed a glucose of 115 and total bilirubin 1.3 mg/dL.  ALT was 47 units/L.  Bilirubin [place] is mildly elevated.  Review of Systems: As per HPI otherwise all other systems reviewed and are negative.  Past Medical History:  Diagnosis Date   Anemia    Takes iron supplement   Anxiety    Arthritis    Chronic pain of left thumb    Constipation    Deformity of left thumb joint    Z collapse deformity to nondominant left thumb   Depression    Fibroid, uterine    GERD (gastroesophageal reflux disease)    History of blood transfusion    Hx of seasonal allergies    Hyperlipemia    Hypertension    hx of off medication now.   OA (osteoarthritis) of  hip    left   Sleep apnea    dentist made mouthipiece     Past Surgical History:  Procedure Laterality Date   BIOPSY  03/30/2019   Procedure: BIOPSY;  Surgeon: Thornton Park, MD;  Location: [place];  Service: Gastroenterology;;   [place]  08/2015   with abductor pollicis longus tendon transfer and suspensionplasty   COLONOSCOPY WITH PROPOFOL N/A 03/31/2019   Procedure: COLONOSCOPY WITH PROPOFOL;  Surgeon: Thornton Park, MD;  Location: [place];  Service: Gastroenterology;  Laterality: N/A;   DE QUERVAIN'S RELEASE  08/2015   DILATATION & CURRETTAGE/HYSTEROSCOPY WITH RESECTOCOPE  2007   ESOPHAGOGASTRODUODENOSCOPY N/A 11/30/2015   Procedure: ESOPHAGOGASTRODUODENOSCOPY (EGD);  Surgeon: [place] Campbell, MD;  Location: [facility];  Service: Endoscopy;  Laterality: N/A;   ESOPHAGOGASTRODUODENOSCOPY (EGD) WITH PROPOFOL N/A 03/30/2019   Procedure: ESOPHAGOGASTRODUODENOSCOPY (EGD) WITH PROPOFOL;  Surgeon: Thornton Park, MD;  Location: [place];  Service: Gastroenterology;  Laterality: N/A;   ESOPHAGOGASTRODUODENOSCOPY (EGD) WITH PROPOFOL N/A 12/31/2020   Procedure: ESOPHAGOGASTRODUODENOSCOPY (EGD) WITH PROPOFOL;  Surgeon: Irene Shipper, MD;  [facility]: [facility];  Service: Endoscopy;  Laterality: N/A;   HERNIA REPAIR Left 06/2012   INGUINAL HERNIA REPAIR Right 05/03/2013   Procedure: HERNIA REPAIR INGUINAL ADULT;  Surgeon: Harl Bowie, MD;  Location: [place];  Service: General;  Laterality: Right;   INGUINAL HERNIA REPAIR Right 08/23/2013   Procedure: LAPAROSCOPIC INGUINAL HERNIA;  Surgeon: Harl Bowie, MD;  Location: [place];  Service: General;  Laterality: Right;   INSERTION OF MESH Right 05/03/2013   Procedure: INSERTION OF MESH;  Surgeon: Harl Bowie, MD;  Location: [place];  Service: General;  Laterality: Right;   INSERTION OF MESH Right 08/23/2013   Procedure: INSERTION OF MESH;  Surgeon: Harl Bowie, MD;  Location: [place];   Service: General;  Laterality: Right;   METACARPOPHALANGEAL JOINT ARTHRODESIS  08/2015   with local bone graft   TONSILLECTOMY     TOTAL HIP ARTHROPLASTY  06/24/2012   Procedure: TOTAL HIP ARTHROPLASTY ANTERIOR APPROACH;  Surgeon: Mcarthur Rossetti, MD;  Location: [facility];  Service: Orthopedics;  Laterality: Left;  Left total hip arthroplasty, anterior approach   UTERINE FIBROID SURGERY  2007   WISDOM TOOTH EXTRACTION      Social History  reports that she has never smoked. She has never used smokeless tobacco. She reports current alcohol use of about 2.0 standard drinks per week. She reports that she does not use drugs.  Allergies  Allergen Reactions   Penicillins Swelling and Other (See Comments)    Tongue became swollen and turned black Did it involve swelling of the face/tongue/throat, SOB, or low BP? Yes Did it involve sudden or severe rash/hives, skin peeling, or any reaction on the inside of your mouth or nose? Yes Did you need to seek medical attention at a hospital or doctor's office? Yes When did it last happen? "I was a child"   If all above answers are "NO", may proceed with cephalosporin use.     Family History  Problem Relation Age of Onset   Heart failure Mother    Osteoporosis Mother    Arthritis Mother    CAD Maternal Grandfather    Heart failure Maternal Grandfather    Dementia Maternal Grandfather    CAD Paternal Grandmother    CAD Paternal Grandfather    Arthritis Sister    Prior to Admission medications   Medication Sig Start Date End Date Taking? Authorizing Provider  CALCIUM PO Take 2 tablets by mouth daily.     [provider]  cetirizine (ZYRTEC) 10 MG tablet Take 10 mg by mouth daily.    [provider]  Cholecalciferol (VITAMIN D3) 250 MCG (10000 UT) TABS Take 10,000 Units by mouth daily.     [provider]  CYANOCOBALAMIN PO Take 1 tablet by mouth daily.    [provider]  NON FORMULARY Take 1 capsule by  mouth See admin instructions. Black Cumiun oil capsules: Take 1 capsule by mouth once a day    [provider]  Omega-3 1000 MG CAPS Take 2,000 mg by mouth daily.     [provider]  pantoprazole (PROTONIX) 40 MG tablet 20 mg. 08/20/21   [provider]  pravastatin (PRAVACHOL) 20 MG tablet Take 20 mg by mouth at bedtime.     [provider]  Turmeric 500 MG CAPS Take 500 mg by mouth daily. Tumeric with cucumin    [provider]  VIIBRYD 40 MG TABS Take 40 mg by mouth daily. 12/24/20   [provider]  vitamin C (ASCORBIC ACID) 500 MG tablet Take 500 mg by mouth daily.    [provider]    Physical Exam: Vitals:   11/03/21 1530 11/03/21 1545 11/03/21 1600 11/03/21 1715  BP: 116/78 123/83 125/85 118/84  Pulse: 64 69 64 61  Resp: 15 15 10 12   Temp:      TempSrc:      SpO2: 97%  100% 100% 100%   Constitutional: NAD, calm, comfortable Eyes: PERRL, lids and conjunctivae normal ENMT: Mucous membranes are moist. Posterior pharynx clear of any exudate or lesions. Neck: normal, supple, no masses, no thyromegaly Respiratory: clear to auscultation bilaterally, no wheezing, no crackles. Normal respiratory effort. No accessory muscle use.  Cardiovascular: Regular rate and rhythm, no murmurs / rubs / gallops. No extremity edema. 2+ pedal pulses. No carotid bruits.  Abdomen: Obese, no distention.  Soft, no tenderness, no masses palpated. No hepatosplenomegaly. Bowel sounds positive.  Musculoskeletal: no clubbing / cyanosis. Good ROM, no contractures. Normal muscle tone.  Skin: no acute rashes, lesions, ulcers on very limited dermatological examination. Neurologic: CN 2-12 grossly intact. Sensation intact, DTR normal. Strength 5/5 in all 4.  Psychiatric: Normal judgment and insight. Alert and oriented x 3. Normal mood.   Labs on Admission: I have personally reviewed following labs and imaging studies  CBC: Recent Labs  Lab  11/03/21 1245 11/03/21 1255  WBC 6.2  --   NEUTROABS 3.8  --   HGB 13.7 14.3  HCT 41.2 42.0  MCV 99.0  --   PLT 201  --     Basic Metabolic Panel: Recent Labs  Lab 11/03/21 1245 11/03/21 1255  NA 138 141  K 3.5 3.6  CL 108 107  CO2 24  --   GLUCOSE 115* 109*  BUN 17 17  CREATININE 0.61 0.60  CALCIUM 9.6  --     GFR: CrCl cannot be calculated (Unknown ideal weight.).  Liver Function Tests: Recent Labs  Lab 11/03/21 1245  AST 31  ALT 47*  ALKPHOS 88  BILITOT 1.3*  PROT 7.0  ALBUMIN 4.2    Urine analysis:    Component Value Date/Time   COLORURINE YELLOW 12/30/2020 [place] 12/30/2020 1835   LABSPEC 1.006 12/30/2020 1835   PHURINE 6.0 12/30/2020 1835   GLUCOSEU NEGATIVE 12/30/2020 [place] 12/30/2020 [place] 12/30/2020 [place] 12/30/2020 [place] 12/30/2020 1835   UROBILINOGEN 0.2 06/16/2012 1025   NITRITE NEGATIVE 12/30/2020 1835   LEUKOCYTESUR TRACE (A) 12/30/2020 1835    Radiological Exams on Admission: No results found.  EKG: Independently reviewed.  Vent. rate 74 BPM PR interval 191 ms QRS duration 82 ms QT/QTcB 393/436 ms P-R-T axes 52 127 14 Sinus rhythm Probable right ventricular hypertrophy  Assessment/Plan Principal Problem:   Rectal bleeding On acute   Chronic gastrointestinal bleeding Observation/telemetry. Clear liquid diet. N.p.o. at midnight. Serial hematocrit and hemoglobin. Protonix 40 mg IVP every 12 hours. GI will see in the morning.  Active Problems:   HLD (hyperlipidemia) Continue pravastatin 20 mg p.o. daily.    GERD (gastroesophageal reflux disease) On Protonix 40 mg IVP every 12 hours.    Depression Continue Viibryd 40 mg p.o. daily.    Essential hypertension Currently diet-controlled. Monitor blood pressure.    Diastolic dysfunction No signs of decompensation.    DVT prophylaxis: SCDs. Code Status:   Full  code. Family Communication:   Disposition Plan:   Patient is from:  Home.  Anticipated DC to:  Home.  Anticipated DC date:  11/04/2021.  Anticipated DC barriers: Clinical status. Consults called:  [facility] (Dr. Bryan Lemma). Admission status:  Observation/telemetry.   Severity of Illness: High severity due to acute on chronic GI bleed with significant postural dizziness.  Reubin Milan MD [facility]  How to contact the [facility] Attending or Consulting provider 7A -  7P or covering provider during after hours [place], for this patient?   Check the care team in [facility] and look for a) attending/consulting [facility] provider listed and b) the [facility] team listed Log into [email or web address] and use Milan's universal password to access. If you do not have the password, please contact the hospital operator. Locate the [facility] provider you are looking for under [facility] and page to a number that you can be directly reached. If you still have difficulty reaching the provider, please page the [facility] (Director on Call) for the Hospitalists listed on amion for assistance.  11/03/2021, 5:27 PM   This document was preparation Dragon voice recognition software and may contain some unintended transcription errors.

## 2021-11-03 NOTE — ED Provider Notes (Signed)
Emergency Medicine Provider Triage Evaluation Note  Molly Lucas , a 75 y.o. female  was evaluated in triage.  Pt complains of melena, dizziness, and nausea.  Patient reports that she has a history of GI bleeds requiring transfusion.  States that she has had black stools the last 4 days.  This morning patient developed dizziness and nausea  Review of Systems  Positive: Dizziness, fatigue, nausea, melena, diarrhea Negative: Fever, chills, abdominal pain, vomiting, chest pain, shortness of breath  Physical Exam  BP (!) 136/94    Pulse 77    Temp 97.9 F (36.6 C) (Oral)    Resp 18    SpO2 98%  Gen:   Awake, no distress   Resp:  Normal effort  MSK:   Moves extremities without difficulty  Other:  Abdomen soft, nondistended, nontender.  Medical Decision Making  Medically screening exam initiated at 12:36 PM.  Appropriate orders placed.  Theodosia [facility] was informed that the remainder of the evaluation will be completed by another provider, this initial triage assessment does not replace that evaluation, and the importance of remaining in the ED until their evaluation is complete.     Loni Beckwith, PA-C 11/03/21 1237    Lorelle Gibbs, DO 11/04/21 1638

## 2021-11-03 NOTE — ED Provider Notes (Signed)
[facility] DEPT Provider Note   CSN: [id number] Arrival date & time: 11/03/21  1207     History Chief Complaint  Patient presents with   Melena   Nausea    Molly Lucas is a 75 y.o. female.  74 year old female presents with dark stools times several days.  Has a history of GI bleeding for unknown etiology.  Does take iron therapy tablets.  Has possibly dizzy but has not had any abdominal pain.  No emesis noted.  No fevers.  Denies any frank bloody stools per rectum.  States that stools are just been dark.  Was concerned and came in for evaluation      Past Medical History:  Diagnosis Date   Anemia    Takes iron supplement   Anxiety    Arthritis    Chronic pain of left thumb    Constipation    Deformity of left thumb joint    Z collapse deformity to nondominant left thumb   Depression    Fibroid, uterine    GERD (gastroesophageal reflux disease)    History of blood transfusion    Hx of seasonal allergies    Hyperlipemia    Hypertension    hx of off medication now.   OA (osteoarthritis) of hip    left   Sleep apnea    dentist made mouthipiece     Patient Active Problem List   Diagnosis Date Noted   Hiatal hernia    Chronic gastric erosion    Essential hypertension 53/61/4431   Diastolic dysfunction 54/00/8676   Systolic murmur 19/50/9326   Red blood cell antibody positive 09/18/2019   GERD (gastroesophageal reflux disease) 05/19/2019   Depression 05/19/2019   History of gastric ulcer 03/29/2019   Insomnia 03/29/2019   Chronic gastrointestinal bleeding 03/29/2019   Excessive daytime sleepiness 06/20/2018   SOB (shortness of breath) on exertion 06/20/2018   Fatigue due to depression 06/20/2018   OSA (obstructive sleep apnea) 06/20/2018   Trigger thumb of right hand 04/29/2017   Iron deficiency anemia 04/09/2016   HLD (hyperlipidemia) 02/26/2016   Family history of heart disease 02/26/2016   Trivial Pericardial  effusion 02/26/2016   Anemia 11/29/2015   Symptomatic anemia 11/29/2015   Dyspnea    Melena    Occult blood in stools    Primary osteoarthritis of first carpometacarpal joint of left hand 09/30/2015   Chronic pain of left thumb 08/01/2015   Recurrent right inguinal hernia 08/15/2013   Right inguinal hernia 03/14/2013   Degenerative arthritis of hip 06/24/2012    Past Surgical History:  Procedure Laterality Date   BIOPSY  03/30/2019   Procedure: BIOPSY;  Surgeon: Thornton Park, MD;  Location: [place];  Service: Gastroenterology;;   [place]  08/2015   with abductor pollicis longus tendon transfer and suspensionplasty   COLONOSCOPY WITH PROPOFOL N/A 03/31/2019   Procedure: COLONOSCOPY WITH PROPOFOL;  Surgeon: Thornton Park, MD;  Location: [place];  Service: Gastroenterology;  Laterality: N/A;   DE QUERVAIN'S RELEASE  08/2015   DILATATION & CURRETTAGE/HYSTEROSCOPY WITH RESECTOCOPE  2007   ESOPHAGOGASTRODUODENOSCOPY N/A 11/30/2015   Procedure: ESOPHAGOGASTRODUODENOSCOPY (EGD);  Surgeon: [place] Campbell, MD;  Location: [facility];  Service: Endoscopy;  Laterality: N/A;   ESOPHAGOGASTRODUODENOSCOPY (EGD) WITH PROPOFOL N/A 03/30/2019   Procedure: ESOPHAGOGASTRODUODENOSCOPY (EGD) WITH PROPOFOL;  Surgeon: Thornton Park, MD;  Location: [place];  Service: Gastroenterology;  Laterality: N/A;   ESOPHAGOGASTRODUODENOSCOPY (EGD) WITH PROPOFOL N/A 12/31/2020   Procedure: ESOPHAGOGASTRODUODENOSCOPY (EGD) WITH PROPOFOL;  Surgeon: Irene Shipper, MD;  Location: [facility];  Service: Endoscopy;  Laterality: N/A;   HERNIA REPAIR Left 06/2012   INGUINAL HERNIA REPAIR Right 05/03/2013   Procedure: HERNIA REPAIR INGUINAL ADULT;  Surgeon: Harl Bowie, MD;  Location: [place];  Service: General;  Laterality: Right;   INGUINAL HERNIA REPAIR Right 08/23/2013   Procedure: LAPAROSCOPIC INGUINAL HERNIA;  Surgeon: Harl Bowie, MD;  Location: [place];  Service:  General;  Laterality: Right;   INSERTION OF MESH Right 05/03/2013   Procedure: INSERTION OF MESH;  Surgeon: Harl Bowie, MD;  Location: [place];  Service: General;  Laterality: Right;   INSERTION OF MESH Right 08/23/2013   Procedure: INSERTION OF MESH;  Surgeon: Harl Bowie, MD;  Location: [place];  Service: General;  Laterality: Right;   METACARPOPHALANGEAL JOINT ARTHRODESIS  08/2015   with local bone graft   TONSILLECTOMY     TOTAL HIP ARTHROPLASTY  06/24/2012   Procedure: TOTAL HIP ARTHROPLASTY ANTERIOR APPROACH;  Surgeon: Mcarthur Rossetti, MD;  [facility]: [facility];  Service: Orthopedics;  Laterality: Left;  Left total hip arthroplasty, anterior approach   UTERINE FIBROID SURGERY  2007   WISDOM TOOTH EXTRACTION       OB History   No obstetric history on file.     Family History  Problem Relation Age of Onset   Heart failure Mother    Osteoporosis Mother    Arthritis Mother    CAD Maternal Grandfather    Heart failure Maternal Grandfather    Dementia Maternal Grandfather    CAD Paternal Grandmother    CAD Paternal Grandfather    Arthritis Sister     Social History   Tobacco Use   Smoking status: Never   Smokeless tobacco: Never  Vaping Use   Vaping Use: Never used  Substance Use Topics   Alcohol use: Yes    Alcohol/week: 2.0 standard drinks    Types: 2 Glasses of wine per week    Comment: occasional glass of wine    Drug use: No    Home Medications Prior to Admission medications   Medication Sig Start Date End Date Taking? Authorizing Provider  CALCIUM PO Take 2 tablets by mouth daily.     [provider]  cetirizine (ZYRTEC) 10 MG tablet Take 10 mg by mouth daily.    [provider]  Cholecalciferol (VITAMIN D3) 250 MCG (10000 UT) TABS Take 10,000 Units by mouth daily.     [provider]  CYANOCOBALAMIN PO Take 1 tablet by mouth daily.    [provider]  NON FORMULARY Take 1 capsule by mouth See admin  instructions. Black Cumiun oil capsules: Take 1 capsule by mouth once a day    [provider]  Omega-3 1000 MG CAPS Take 2,000 mg by mouth daily.     [provider]  pantoprazole (PROTONIX) 40 MG tablet 20 mg. 08/20/21   [provider]  pravastatin (PRAVACHOL) 20 MG tablet Take 20 mg by mouth at bedtime.     [provider]  Turmeric 500 MG CAPS Take 500 mg by mouth daily. Tumeric with cucumin    [provider]  VIIBRYD 40 MG TABS Take 40 mg by mouth daily. 12/24/20   [provider]  vitamin C (ASCORBIC ACID) 500 MG tablet Take 500 mg by mouth daily.    [provider]    Allergies    Penicillins  Review of Systems   Review of  Systems  All other systems reviewed and are negative.  Physical Exam Updated Vital Signs BP 101/68    Pulse 69    Temp 97.9 F (36.6 C) (Oral)    Resp 18    SpO2 98%   Physical Exam Vitals and nursing note reviewed.  Constitutional:      General: She is not in acute distress.    Appearance: Normal appearance. She is well-developed. She is not toxic-appearing.  HENT:     Head: Normocephalic and atraumatic.  Eyes:     General: Lids are normal.     Conjunctiva/sclera: Conjunctivae normal.     Pupils: Pupils are equal, round, and reactive to light.  Neck:     Thyroid: No thyroid mass.     Trachea: No tracheal deviation.  Cardiovascular:     Rate and Rhythm: Normal rate and regular rhythm.     Heart sounds: Normal heart sounds. No murmur heard.   No gallop.  Pulmonary:     Effort: Pulmonary effort is normal. No respiratory distress.     Breath sounds: Normal breath sounds. No stridor. No decreased breath sounds, wheezing, rhonchi or rales.  Abdominal:     General: There is no distension.     Palpations: Abdomen is soft.     Tenderness: There is no abdominal tenderness. There is no rebound.  Genitourinary:    Rectum: Guaiac result positive. No tenderness, external hemorrhoid or internal  hemorrhoid.  Musculoskeletal:        General: No tenderness. Normal range of motion.     Cervical back: Normal range of motion and neck supple.  Skin:    General: Skin is warm and dry.     Findings: No abrasion or rash.  Neurological:     Mental Status: She is alert and oriented to person, place, and time. Mental status is at baseline.     GCS: GCS eye subscore is 4. GCS verbal subscore is 5. GCS motor subscore is 6.     Cranial Nerves: No cranial nerve deficit.     Sensory: No sensory deficit.     Motor: Motor function is intact.  Psychiatric:        Attention and Perception: Attention normal.        Speech: Speech normal.        Behavior: Behavior normal.    ED Results / Procedures / Treatments   Labs (all labs ordered are listed, but only abnormal results are displayed) Labs Reviewed  COMPREHENSIVE METABOLIC PANEL - Abnormal; Notable for the following components:      Result Value   Glucose, Bld 115 (*)    ALT 47 (*)    Total Bilirubin 1.3 (*)    All other components within normal limits  I-STAT CHEM 8, ED - Abnormal; Notable for the following components:   Glucose, Bld 109 (*)    All other components within normal limits  CBC WITH DIFFERENTIAL/PLATELET  POC OCCULT BLOOD, ED  TYPE AND SCREEN    EKG EKG Interpretation  Date/Time:  Monday November 03 2021 12:54:16 EST Ventricular Rate:  74 PR Interval:  191 QRS Duration: 82 QT Interval:  393 QTC Calculation: 436 R Axis:   127 Text Interpretation: Sinus rhythm Probable right ventricular hypertrophy Confirmed by Lacretia Leigh ([id number]) on 11/03/2021 1:37:05 PM  Radiology No results found.  Procedures Procedures   Medications Ordered in ED Medications - No data to display  ED Course  I have reviewed the triage vital signs and the  nursing notes.  Pertinent labs & imaging results that were available during my care of the patient were reviewed by me and considered in my medical decision making (see chart for  details).    MDM Rules/Calculators/A&P                         Patient has guaiac positive stools here.  She does not take oral iron therapy but instead gets IV infusions.  She endorses that she feels weak and rundown.  Initial hemoglobin is stable here.  Patient states that she has a long history of GI bleeding of which there is no etiology.  Plan will be at this time to have her admitted for observation to the medicine service due to concern for ongoing GI bleed.  Patient to require serial hemoglobins    Final Clinical Impression(s) / ED Diagnoses Final diagnoses:  None    Rx / DC Orders ED Discharge Orders     None        Lacretia Leigh, MD 11/03/21 1528

## 2021-11-03 NOTE — ED Notes (Signed)
ED TO INPATIENT HANDOFF REPORT  ED Nurse Name and Phone #: Erick Colace [phone]  S Name/Age/Gender Molly Lucas 75 y.o. female Room/Bed: WA03/WA03  Code Status   Code Status: Full Code  Home/SNF/Other Home Patient oriented to: self, place, time, and situation Is this baseline? Yes   Triage Complete: Triage complete  Chief Complaint Rectal bleeding [K62.5]  Triage Note Pt states she thinks she is having a bleed. Pt states she is anemic, she gets iron infusions. She states her last iron infusion was early December. Pt c/o dizziness, nausea, black stools x4 days.    Allergies Allergies  Allergen Reactions   Penicillins Swelling and Other (See Comments)    Tongue became swollen and turned black Did it involve swelling of the face/tongue/throat, SOB, or low BP? Yes Did it involve sudden or severe rash/hives, skin peeling, or any reaction on the inside of your mouth or nose? Yes Did you need to seek medical attention at a hospital or doctor's office? Yes When did it last happen? "I was a child"   If all above answers are "NO", may proceed with cephalosporin use.     Level of Care/Admitting Diagnosis ED Disposition     ED Disposition  Admit   Condition  --   Comment  Hospital Area: [facility] [[id number]]  Level of Care: Med-Surg [16]  May place patient in observation at [facility] or [place] if equivalent level of care is available:: No  Covid Evaluation: Asymptomatic Screening Protocol (No Symptoms)  Diagnosis: Rectal bleeding [217577]  Admitting Physician: Reubin Milan [[id number]]  Attending Physician: Reubin Milan [[id number]]          B Medical/Surgery History Past Medical History:  Diagnosis Date   Anemia    Takes iron supplement   Anxiety    Arthritis    Chronic pain of left thumb    Constipation    Deformity of left thumb joint    Z collapse deformity to nondominant left thumb   Depression    Fibroid,  uterine    GERD (gastroesophageal reflux disease)    History of blood transfusion    Hx of seasonal allergies    Hyperlipemia    Hypertension    hx of off medication now.   OA (osteoarthritis) of hip    left   Sleep apnea    dentist made mouthipiece    Past Surgical History:  Procedure Laterality Date   BIOPSY  03/30/2019   Procedure: BIOPSY;  Surgeon: Thornton Park, MD;  Location: [place];  Service: Gastroenterology;;   [place]  08/2015   with abductor pollicis longus tendon transfer and suspensionplasty   COLONOSCOPY WITH PROPOFOL N/A 03/31/2019   Procedure: COLONOSCOPY WITH PROPOFOL;  Surgeon: Thornton Park, MD;  Location: [place];  Service: Gastroenterology;  Laterality: N/A;   DE QUERVAIN'S RELEASE  08/2015   DILATATION & CURRETTAGE/HYSTEROSCOPY WITH RESECTOCOPE  2007   ESOPHAGOGASTRODUODENOSCOPY N/A 11/30/2015   Procedure: ESOPHAGOGASTRODUODENOSCOPY (EGD);  Surgeon: Richmond Campbell, MD;  Location: [facility];  Service: Endoscopy;  Laterality: N/A;   ESOPHAGOGASTRODUODENOSCOPY (EGD) WITH PROPOFOL N/A 03/30/2019   Procedure: ESOPHAGOGASTRODUODENOSCOPY (EGD) WITH PROPOFOL;  Surgeon: Thornton Park, MD;  Location: [place];  Service: Gastroenterology;  Laterality: N/A;   ESOPHAGOGASTRODUODENOSCOPY (EGD) WITH PROPOFOL N/A 12/31/2020   Procedure: ESOPHAGOGASTRODUODENOSCOPY (EGD) WITH PROPOFOL;  Surgeon: Irene Shipper, MD;  Location: [facility];  Service: Endoscopy;  Laterality: N/A;   HERNIA REPAIR Left 06/2012   INGUINAL HERNIA REPAIR  Right 05/03/2013   Procedure: HERNIA REPAIR INGUINAL ADULT;  Surgeon: Harl Bowie, MD;  Location: [place];  Service: General;  Laterality: Right;   INGUINAL HERNIA REPAIR Right 08/23/2013   Procedure: LAPAROSCOPIC INGUINAL HERNIA;  Surgeon: Harl Bowie, MD;  Location: [place];  Service: General;  Laterality: Right;   INSERTION OF MESH Right 05/03/2013   Procedure: INSERTION OF MESH;  Surgeon:  Harl Bowie, MD;  Location: [place];  Service: General;  Laterality: Right;   INSERTION OF MESH Right 08/23/2013   Procedure: INSERTION OF MESH;  Surgeon: Harl Bowie, MD;  Location: [place];  Service: General;  Laterality: Right;   METACARPOPHALANGEAL JOINT ARTHRODESIS  08/2015   with local bone graft   TONSILLECTOMY     TOTAL HIP ARTHROPLASTY  06/24/2012   Procedure: TOTAL HIP ARTHROPLASTY ANTERIOR APPROACH;  Surgeon: Mcarthur Rossetti, MD;  Location: [facility];  Service: Orthopedics;  Laterality: Left;  Left total hip arthroplasty, anterior approach   UTERINE FIBROID SURGERY  2007   WISDOM TOOTH EXTRACTION       A IV Location/Drains/Wounds Patient Lines/Drains/Airways Status     Active Line/Drains/Airways     Name Placement date Placement time Site Days   Peripheral IV 11/03/21 20 G Anterior;Right Forearm 11/03/21  1251  Forearm  less than 1            Intake/Output Last 24 hours  Intake/Output Summary (Last 24 hours) at 11/03/2021 2049 Last data filed at 11/03/2021 9233 Gross per 24 hour  Intake 1181.25 ml  Output --  Net 1181.25 ml    Labs/Imaging Results for orders placed or performed during the hospital encounter of 11/03/21 (from the past 48 hour(s))  Comprehensive metabolic panel     Status: Abnormal   Collection Time: 11/03/21 12:45 PM  Result Value Ref Range   Sodium 138 135 - 145 mmol/L   Potassium 3.5 3.5 - 5.1 mmol/L   Chloride 108 98 - 111 mmol/L   CO2 24 22 - 32 mmol/L   Glucose, Bld 115 (H) 70 - 99 mg/dL    Comment: Glucose reference range applies only to samples taken after fasting for at least 8 hours.   BUN 17 8 - 23 mg/dL   Creatinine, Ser 0.61 0.44 - 1.00 mg/dL   Calcium 9.6 8.9 - 10.3 mg/dL   Total Protein 7.0 6.5 - 8.1 g/dL   Albumin 4.2 3.5 - 5.0 g/dL   AST 31 15 - 41 U/L   ALT 47 (H) 0 - 44 U/L   Alkaline Phosphatase 88 38 - 126 U/L   Total Bilirubin 1.3 (H) 0.3 - 1.2 mg/dL   GFR, Estimated >60 >60 mL/min    Comment:  (NOTE) Calculated using the CKD-EPI Creatinine Equation (2021)    Anion gap 6 5 - 15    Comment: Performed at [facility], [place] [address], [place], [place] [address]  CBC with Differential     Status: None   Collection Time: 11/03/21 12:45 PM  Result Value Ref Range   WBC 6.2 4.0 - 10.5 K/uL   RBC 4.16 3.87 - 5.11 MIL/uL   Hemoglobin 13.7 12.0 - 15.0 g/dL   HCT 41.2 36.0 - 46.0 %   MCV 99.0 80.0 - 100.0 fL   MCH 32.9 26.0 - 34.0 pg   MCHC 33.3 30.0 - 36.0 g/dL   RDW 13.1 11.5 - 15.5 %   Platelets 201 150 - 400 K/uL   nRBC 0.0 0.0 -  0.2 %   Neutrophils Relative % 61 %   Neutro Abs 3.8 1.7 - 7.7 K/uL   Lymphocytes Relative 25 %   Lymphs Abs 1.5 0.7 - 4.0 K/uL   Monocytes Relative 9 %   Monocytes Absolute 0.6 0.1 - 1.0 K/uL   Eosinophils Relative 4 %   Eosinophils Absolute 0.2 0.0 - 0.5 K/uL   Basophils Relative 1 %   Basophils Absolute 0.1 0.0 - 0.1 K/uL   Immature Granulocytes 0 %   Abs Immature Granulocytes 0.02 0.00 - 0.07 K/uL    Comment: Performed at [facility], [place] [address] [place], [place] [address]  Type and screen [place]     Status: None   Collection Time: 11/03/21 12:45 PM  Result Value Ref Range   ABO/RH(D) O POS    Antibody Screen NEG    Sample Expiration      11/06/2021,2359 Performed at [facility] [address], [place], [place] [address]   I-stat chem 8, ED (not at [facility] or [facility])     Status: Abnormal   Collection Time: 11/03/21 12:55 PM  Result Value Ref Range   Sodium 141 135 - 145 mmol/L   Potassium 3.6 3.5 - 5.1 mmol/L   Chloride 107 98 - 111 mmol/L   BUN 17 8 - 23 mg/dL   Creatinine, Ser 0.60 0.44 - 1.00 mg/dL   Glucose, Bld 109 (H) 70 - 99 mg/dL    Comment: Glucose reference range applies only to samples taken after fasting for at least 8 hours.   Calcium, Ion 1.31 1.15 - 1.40 mmol/L   TCO2 25 22 - 32 mmol/L   Hemoglobin 14.3 12.0 - 15.0 g/dL   HCT 42.0 36.0  - 46.0 %  POC occult blood, ED Provider will collect     Status: Abnormal   Collection Time: 11/03/21  1:50 PM  Result Value Ref Range   Fecal Occult Bld POSITIVE (A) NEGATIVE  Hemoglobin and hematocrit, blood     Status: None   Collection Time: 11/03/21  6:35 PM  Result Value Ref Range   Hemoglobin 13.0 12.0 - 15.0 g/dL   HCT 40.0 36.0 - 46.0 %    Comment: Performed at [facility], [place] [address] [place], [place] [address]  Resp Panel by RT-PCR (Flu A&B, Covid) Nasopharyngeal Swab     Status: None   Collection Time: 11/03/21  7:33 PM   Specimen: Nasopharyngeal Swab; Nasopharyngeal(NP) swabs in vial transport medium  Result Value Ref Range   SARS Coronavirus 2 by RT PCR NEGATIVE NEGATIVE    Comment: (NOTE) SARS-CoV-2 target nucleic acids are NOT DETECTED.  The SARS-CoV-2 RNA is generally detectable in upper respiratory specimens during the acute phase of infection. The lowest concentration of SARS-CoV-2 viral copies this assay can detect is 138 copies/mL. A negative result does not preclude SARS-Cov-2 infection and should not be used as the sole basis for treatment or other patient management decisions. A negative result may occur with  improper specimen collection/handling, submission of specimen other than nasopharyngeal swab, presence of viral mutation(s) within the areas targeted by this assay, and inadequate number of viral copies(<138 copies/mL). A negative result must be combined with clinical observations, patient history, and epidemiological information. The expected result is Negative.  Fact Sheet for Patients:  EntrepreneurPulse.com.au  Fact Sheet for Healthcare Providers:  IncredibleEmployment.be  This test is no t yet approved or cleared by the [place] and  has been authorized for detection and/or diagnosis of SARS-CoV-2 by FDA under an Emergency Use Authorization (EUA). This EUA will remain  in  effect (meaning this test can be used) for the duration of the COVID-19 declaration under Section 564(b)(1) of the Act, 21 U.S.C.section 360bbb-3(b)(1), unless the authorization is terminated  or revoked sooner.       Influenza A by PCR NEGATIVE NEGATIVE   Influenza B by PCR NEGATIVE NEGATIVE    Comment: (NOTE) The Xpert Xpress SARS-CoV-2/FLU/RSV plus assay is intended as an aid in the diagnosis of influenza from Nasopharyngeal swab specimens and should not be used as a sole basis for treatment. Nasal washings and aspirates are unacceptable for Xpert Xpress SARS-CoV-2/FLU/RSV testing.  Fact Sheet for Patients: EntrepreneurPulse.com.au  Fact Sheet for Healthcare Providers: IncredibleEmployment.be  This test is not yet approved or cleared by the [place] FDA and has been authorized for detection and/or diagnosis of SARS-CoV-2 by FDA under an Emergency Use Authorization (EUA). This EUA will remain in effect (meaning this test can be used) for the duration of the COVID-19 declaration under Section 564(b)(1) of the Act, 21 U.S.C. section 360bbb-3(b)(1), unless the authorization is terminated or revoked.  Performed at [facility], [place] [address], [place], [place] [address]    No results found.  Pending Labs Unresulted Labs (From admission, onward)     Start     Ordered   11/03/21 1800  Hemoglobin and hematocrit, blood  Now then every 6 hours,   R      11/03/21 1539            Vitals/Pain Today's Vitals   11/03/21 1715 11/03/21 1800 11/03/21 1815 11/03/21 2010  BP: 118/84 (!) 141/78 (!) 143/76 (!) 107/50  Pulse: 61 71 69 60  Resp: 12 19 (!) 29 16  Temp:      TempSrc:      SpO2: 100% 100% 100% 98%  PainSc:        Isolation Precautions No active isolations  Medications Medications  lactated ringers infusion ( Intravenous Rate/Dose Change 11/03/21 2006)  pantoprazole (PROTONIX) injection 40 mg (40 mg  Intravenous Given 11/03/21 1704)  pravastatin (PRAVACHOL) tablet 20 mg (has no administration in time range)  Vilazodone HCl TABS 40 mg (40 mg Oral Not Given 11/03/21 2015)  acetaminophen (TYLENOL) tablet 650 mg (has no administration in time range)    Or  acetaminophen (TYLENOL) suppository 650 mg (has no administration in time range)  ondansetron (ZOFRAN) tablet 4 mg (has no administration in time range)    Or  ondansetron (ZOFRAN) injection 4 mg (has no administration in time range)  lactated ringers bolus 1,000 mL (0 mLs Intravenous Stopped 11/03/21 1706)    Mobility walks with person assist Low fall risk   Focused Assessments    R Recommendations: See Admitting Provider Note  Report given to:   Additional Notes:

## 2021-11-03 NOTE — ED Triage Notes (Signed)
Pt states she thinks she is having a bleed. Pt states she is anemic, she gets iron infusions. She states her last iron infusion was early December. Pt c/o dizziness, nausea, black stools x4 days.

## 2021-11-04 ENCOUNTER — Observation Stay (HOSPITAL_COMMUNITY): Payer: Medicare Other | Admitting: Anesthesiology

## 2021-11-04 ENCOUNTER — Encounter (HOSPITAL_COMMUNITY): Payer: Self-pay | Admitting: Internal Medicine

## 2021-11-04 ENCOUNTER — Encounter (HOSPITAL_COMMUNITY)
Admission: EM | Disposition: A | Discharge status: Home / Self Care | Payer: Self-pay | Source: Home / Self Care | Attending: Emergency Medicine

## 2021-11-04 DIAGNOSIS — Z79899 Other long term (current) drug therapy: Secondary | ICD-10-CM | POA: Diagnosis not present

## 2021-11-04 DIAGNOSIS — Z20822 Contact with and (suspected) exposure to covid-19: Secondary | ICD-10-CM | POA: Diagnosis not present

## 2021-11-04 DIAGNOSIS — I10 Essential (primary) hypertension: Secondary | ICD-10-CM | POA: Diagnosis not present

## 2021-11-04 DIAGNOSIS — Q399 Congenital malformation of esophagus, unspecified: Secondary | ICD-10-CM | POA: Diagnosis not present

## 2021-11-04 DIAGNOSIS — K449 Diaphragmatic hernia without obstruction or gangrene: Secondary | ICD-10-CM | POA: Diagnosis not present

## 2021-11-04 DIAGNOSIS — E785 Hyperlipidemia, unspecified: Secondary | ICD-10-CM | POA: Diagnosis not present

## 2021-11-04 DIAGNOSIS — G4733 Obstructive sleep apnea (adult) (pediatric): Secondary | ICD-10-CM | POA: Diagnosis not present

## 2021-11-04 DIAGNOSIS — R195 Other fecal abnormalities: Secondary | ICD-10-CM | POA: Diagnosis not present

## 2021-11-04 DIAGNOSIS — Z8639 Personal history of other endocrine, nutritional and metabolic disease: Secondary | ICD-10-CM | POA: Diagnosis not present

## 2021-11-04 DIAGNOSIS — K625 Hemorrhage of anus and rectum: Secondary | ICD-10-CM | POA: Diagnosis not present

## 2021-11-04 DIAGNOSIS — K921 Melena: Secondary | ICD-10-CM | POA: Diagnosis not present

## 2021-11-04 DIAGNOSIS — K259 Gastric ulcer, unspecified as acute or chronic, without hemorrhage or perforation: Secondary | ICD-10-CM | POA: Diagnosis not present

## 2021-11-04 HISTORY — PX: ESOPHAGOGASTRODUODENOSCOPY (EGD) WITH PROPOFOL: SHX5813

## 2021-11-04 LAB — HEMOGLOBIN AND HEMATOCRIT, BLOOD
HCT: 33.9 % — ABNORMAL LOW (ref 36.0–46.0)
HCT: 39.4 % (ref 36.0–46.0)
Hemoglobin: 11.2 g/dL — ABNORMAL LOW (ref 12.0–15.0)
Hemoglobin: 13 g/dL (ref 12.0–15.0)

## 2021-11-04 SURGERY — ESOPHAGOGASTRODUODENOSCOPY (EGD) WITH PROPOFOL
Anesthesia: Monitor Anesthesia Care

## 2021-11-04 MED ORDER — PROPOFOL 500 MG/50ML IV EMUL
INTRAVENOUS | Status: DC | PRN
  Administered 2021-11-04: 125 ug/kg/min via INTRAVENOUS

## 2021-11-04 MED ORDER — BIOTIN W/ VITAMINS C & E 1250-7.5-7.5 MCG-MG-UNT PO CHEW: CHEWABLE_TABLET | Freq: Every day | ORAL | Status: DC

## 2021-11-04 MED ORDER — LACTATED RINGERS IV SOLN
INTRAVENOUS | Status: AC | PRN
  Administered 2021-11-04: 1000 mL via INTRAVENOUS

## 2021-11-04 MED ORDER — SODIUM CHLORIDE 0.9 % IV SOLN: INTRAVENOUS | Status: DC

## 2021-11-04 MED ORDER — PANTOPRAZOLE SODIUM 40 MG IV SOLR: 40.0000 mg | INTRAVENOUS | Status: DC

## 2021-11-04 MED ORDER — PROPOFOL 10 MG/ML IV BOLUS
INTRAVENOUS | Status: DC | PRN
  Administered 2021-11-04 (×2): 20 mg via INTRAVENOUS

## 2021-11-04 MED ORDER — LIDOCAINE 2% (20 MG/ML) 5 ML SYRINGE
INTRAMUSCULAR | Status: DC | PRN
  Administered 2021-11-04: 100 mg via INTRAVENOUS

## 2021-11-04 MED ORDER — PROPOFOL 500 MG/50ML IV EMUL
INTRAVENOUS | Status: AC
  Filled 2021-11-04: qty 50

## 2021-11-04 MED ORDER — BIOTIN 2.5 MG PO TABS: ORAL_TABLET | Freq: Every day | ORAL | Status: DC

## 2021-11-04 SURGICAL SUPPLY — 15 items

## 2021-11-04 NOTE — Progress Notes (Signed)

## 2021-11-04 NOTE — Interval H&P Note (Signed)
History and Physical Interval Note: 75/female with history of Lucas requring IV iron infusions, hiatal hernia and cameron lesions, SB AVMs presented with melena but normal HB, for an EGD with propofol. 11/04/2021 12:22 PM  Molly Lucas  has presented today for EGD, with the diagnosis of melena, history of SB AVMs and hiatal hernia, history of Lucas.  The various methods of treatment have been discussed with the patient and family. After consideration of risks, benefits and other options for treatment, the patient has consented to  Procedure(s): ESOPHAGOGASTRODUODENOSCOPY (EGD) WITH PROPOFOL (N/A) as a surgical intervention.  The patient's history has been reviewed, patient examined, no change in status, stable for surgery.  I have reviewed the patient's chart and labs.  Questions were answered to the patient's satisfaction.     Ronnette Juniper

## 2021-11-04 NOTE — Consult Note (Signed)
[facility] Consult  Referring Provider: [facility] Primary Care Physician:  Molly Battles, MD Primary Gastroenterologist: Dr. Verl Dicker  Reason for Consultation: Melena  HPI: Molly Lucas is a 75 y.o. female with history of iron deficiency anemia requiring periodic IV iron infusions presented to the ER with several days of black liquid stools associated with dizziness on getting up. Patient states that her hemoglobin was around 15 after receiving IV iron infusion in the first week of December and was found to have a hemoglobin of around 13 on admission. Patient reports having a capsule endoscopy at [facility] in 2019 and was told that she has several small bowel AVMs which were not bleeding at that point. Prior GI work-up includes endoscopies which have shown  hiatal hernia and Molly Lucas lesion-EGD 12/2020 Colonoscopy 03/2019 unremarkable  Patient denies use of NSAIDs or aspirin. She takes pantoprazole 40 mg, supposed to take it daily but forgets it intermittently. Denies acid reflux, heartburn, difficulty swallowing, pain on swallowing, unintentional weight loss, early satiety, bloating or loss of appetite.  Past Medical History:  Diagnosis Date   Anemia    Takes iron supplement   Anxiety    Arthritis    Chronic pain of left thumb    Constipation    Deformity of left thumb joint    Z collapse deformity to nondominant left thumb   Depression    Fibroid, uterine    GERD (gastroesophageal reflux disease)    History of blood transfusion    Hx of seasonal allergies    Hyperlipemia    Hypertension    hx of off medication now.   OA (osteoarthritis) of hip    left   Sleep apnea    dentist made mouthipiece     Past Surgical History:  Procedure Laterality Date   BIOPSY  03/30/2019   Procedure: BIOPSY;  Surgeon: Thornton Park, MD;  Location: [place];  Service: Gastroenterology;;   [place]  08/2015   with abductor  pollicis longus tendon transfer and suspensionplasty   COLONOSCOPY WITH PROPOFOL N/A 03/31/2019   Procedure: COLONOSCOPY WITH PROPOFOL;  Surgeon: Thornton Park, MD;  Location: [place];  Service: Gastroenterology;  Laterality: N/A;   DE QUERVAIN'S RELEASE  08/2015   DILATATION & CURRETTAGE/HYSTEROSCOPY WITH RESECTOCOPE  2007   ESOPHAGOGASTRODUODENOSCOPY N/A 11/30/2015   Procedure: ESOPHAGOGASTRODUODENOSCOPY (EGD);  Surgeon: Richmond Campbell, MD;  Location: [facility];  Service: Endoscopy;  Laterality: N/A;   ESOPHAGOGASTRODUODENOSCOPY (EGD) WITH PROPOFOL N/A 03/30/2019   Procedure: ESOPHAGOGASTRODUODENOSCOPY (EGD) WITH PROPOFOL;  Surgeon: Thornton Park, MD;  Location: [place];  Service: Gastroenterology;  Laterality: N/A;   ESOPHAGOGASTRODUODENOSCOPY (EGD) WITH PROPOFOL N/A 12/31/2020   Procedure: ESOPHAGOGASTRODUODENOSCOPY (EGD) WITH PROPOFOL;  Surgeon: Irene Shipper, MD;  Location: [facility];  Service: Endoscopy;  Laterality: N/A;   HERNIA REPAIR Left 06/2012   INGUINAL HERNIA REPAIR Right 05/03/2013   Procedure: HERNIA REPAIR INGUINAL ADULT;  Surgeon: Harl Bowie, MD;  Location: [place];  Service: General;  Laterality: Right;   INGUINAL HERNIA REPAIR Right 08/23/2013   Procedure: LAPAROSCOPIC INGUINAL HERNIA;  Surgeon: Harl Bowie, MD;  Location: [place];  Service: General;  Laterality: Right;   INSERTION OF MESH Right 05/03/2013   Procedure: INSERTION OF MESH;  Surgeon: Harl Bowie, MD;  Location: [place];  Service: General;  Laterality: Right;   INSERTION OF MESH Right 08/23/2013   Procedure: INSERTION OF MESH;  Surgeon: Harl Bowie, MD;  Location: [place];  Service: General;  Laterality:  Right;   METACARPOPHALANGEAL JOINT ARTHRODESIS  08/2015   with local bone graft   TONSILLECTOMY     TOTAL HIP ARTHROPLASTY  06/24/2012   Procedure: TOTAL HIP ARTHROPLASTY ANTERIOR APPROACH;  Surgeon: Mcarthur Rossetti, MD;  Location: [facility];  Service: Orthopedics;   Laterality: Left;  Left total hip arthroplasty, anterior approach   UTERINE FIBROID SURGERY  2007   WISDOM TOOTH EXTRACTION      Prior to Admission medications   Medication Sig Start Date End Date Taking? Authorizing Provider  BIOTIN PO Take 1 tablet by mouth daily.   Yes [provider]  Biotin w/ Vitamins C & E (HAIR SKIN & NAILS GUMMIES PO) Take 1 Piece by mouth daily.   Yes [provider]  CALCIUM PO Take 1,000 mg by mouth daily.   Yes [provider]  cetirizine (ZYRTEC) 10 MG tablet Take 10 mg by mouth in the morning and at bedtime.   Yes [provider]  Cholecalciferol (VITAMIN D3) 250 MCG (10000 UT) TABS Take 10,000 Units by mouth daily.    Yes [provider]  COLLAGEN PO Take 1 capsule by mouth daily.   Yes [provider]  cyanocobalamin 1000 MCG tablet Take 1 tablet by mouth daily.   Yes [provider]  NON FORMULARY Take 1 capsule by mouth See admin instructions. Black [place] oil capsules: Take 1 capsule by mouth once a day   Yes [provider]  Omega-3 1000 MG CAPS Take 2,000 mg by mouth daily.    Yes [provider]  pantoprazole (PROTONIX) 20 MG tablet 20 mg daily. 08/20/21  Yes [provider]  pravastatin (PRAVACHOL) 20 MG tablet Take 20 mg by mouth at bedtime.    Yes [provider]  Turmeric 500 MG CAPS Take 500 mg by mouth daily. Tumeric with cucumin   Yes [provider]  VIIBRYD 40 MG TABS Take 40 mg by mouth daily. 12/24/20  Yes [provider]  vitamin C (ASCORBIC ACID) 500 MG tablet Take 1,000 mg by mouth daily.   Yes [provider]    Current Facility-Administered Medications  Medication Dose Route Frequency Provider Last Rate Last Admin   acetaminophen (TYLENOL) tablet 650 mg  650 mg Oral Q6H PRN Reubin Milan, MD       Or   acetaminophen (TYLENOL) suppository 650 mg  650 mg Rectal Q6H PRN Reubin Milan, MD        loratadine (CLARITIN) tablet 10 mg  10 mg Oral QHS Reubin Milan, MD   10 mg at 11/03/21 2338   ondansetron [facility]) tablet 4 mg  4 mg Oral Q6H PRN Reubin Milan, MD       Or   ondansetron [facility]) injection 4 mg  4 mg Intravenous Q6H PRN Reubin Milan, MD       pantoprazole (PROTONIX) injection 40 mg  40 mg Intravenous Q12H Reubin Milan, MD   40 mg at 11/03/21 1704   pravastatin (PRAVACHOL) tablet 20 mg  20 mg Oral QHS Reubin Milan, MD   20 mg at 11/03/21 2226   Vilazodone HCl TABS 40 mg  40 mg Oral Daily Reubin Milan, MD        Allergies as of 11/03/2021 - Review Complete 11/03/2021  Allergen Reaction Noted   Penicillins Swelling and Other (See Comments) 04/25/2012    Family History  Problem Relation Age of Onset   Heart failure Mother  Osteoporosis Mother    Arthritis Mother    CAD Maternal Grandfather    Heart failure Maternal Grandfather    Dementia Maternal Grandfather    CAD Paternal Grandmother    CAD Paternal Grandfather    Arthritis Sister     Social History   Socioeconomic History   Marital status: Divorced    Spouse name: Not on file   Number of children: Not on file   Years of education: Not on file   Highest education level: Not on file  Occupational History   Not on file  Tobacco Use   Smoking status: Never   Smokeless tobacco: Never  Vaping Use   Vaping Use: Never used  Substance and Sexual Activity   Alcohol use: Yes    Alcohol/week: 2.0 standard drinks    Types: 2 Glasses of wine per week    Comment: occasional glass of wine    Drug use: No   Sexual activity: Not on file  Other Topics Concern   Not on file  Social History Narrative   Not on file   Social Determinants of Health   Financial Resource Strain: Not on file  Food Insecurity: Not on file  Transportation Needs: Not on file  Physical Activity: Not on file  Stress: Not on file  Social Connections: Not on file  Intimate Partner Violence: Not  on file    Review of Systems: Positive for: GI: Described in detail in HPI.    Gen: Denies any fever, chills, rigors, night sweats, anorexia, fatigue, weakness, malaise, involuntary weight loss, and sleep disorder CV: Denies chest pain, angina, palpitations, syncope, orthopnea, PND, peripheral edema, and claudication. Resp: Denies dyspnea, cough, sputum, wheezing, coughing up blood. GU : Denies urinary burning, blood in urine, urinary frequency, urinary hesitancy, nocturnal urination, and urinary incontinence. MS: Bilateral joint deformity of the hands from arthritis Derm: Denies rash, itching, oral ulcerations, hives, unhealing ulcers.  Psych: Denies depression, anxiety, memory loss, suicidal ideation, hallucinations,  and confusion. Heme: Denies bruising, bleeding, and enlarged lymph nodes. Neuro:  Denies any headaches, dizziness, paresthesias. Endo:  Denies any problems with DM, thyroid, adrenal function.  Physical Exam: Vital signs in last 24 hours: Temp:  [97.8 F (36.6 C)-98.2 F (36.8 C)] 98.2 F (36.8 C) (12/27 0415) Pulse Rate:  [60-77] 67 (12/27 0415) Resp:  [10-29] 15 (12/27 0415) BP: (101-144)/(50-101) 112/82 (12/27 0415) SpO2:  [96 %-100 %] 98 % (12/27 0415) Weight:  [91.6 kg] 91.6 kg (12/26 2300) Last BM Date: 11/03/21  General:   Alert,  Well-developed, well-nourished, pleasant and cooperative in NAD Head:  Normocephalic and atraumatic. Eyes:  Sclera clear, no icterus.   Conjunctiva pink. Ears:  Normal auditory acuity. Nose:  No deformity, discharge,  or lesions. Mouth:  No deformity or lesions.  Oropharynx pink & moist. Neck:  Supple; no masses or thyromegaly. Lungs:  Clear throughout to auscultation.   No wheezes, crackles, or rhonchi. No acute distress. Heart:  Regular rate and rhythm; no murmurs, clicks, rubs,  or gallops. Extremities: No edema, changes of arthritis noted in bilateral hands Neurologic:  Alert and  oriented x4;  grossly normal  neurologically. Skin:  Intact without significant lesions or rashes. Psych:  Alert and cooperative. Normal mood and affect. Abdomen:  Soft, nontender and nondistended. No masses, hepatosplenomegaly or hernias noted. Normal bowel sounds, without guarding, and without rebound.         Lab Results: Recent Labs    11/03/21 1245 11/03/21 1255 11/03/21 1835 11/04/21 0031  11/04/21 0539  WBC 6.2  --   --   --   --   HGB 13.7   < > 13.0 11.2* 13.0  HCT 41.2   < > 40.0 33.9* 39.4  PLT 201  --   --   --   --    < > = values in this interval not displayed.   BMET Recent Labs    11/03/21 1245 11/03/21 1255  NA 138 141  K 3.5 3.6  CL 108 107  CO2 24  --   GLUCOSE 115* 109*  BUN 17 17  CREATININE 0.61 0.60  CALCIUM 9.6  --    LFT Recent Labs    11/03/21 1245  PROT 7.0  ALBUMIN 4.2  AST 31  ALT 47*  ALKPHOS 88  BILITOT 1.3*   PT/INR No results for input(s): LABPROT, INR in the last 72 hours.  Studies/Results: No results found.  Impression: Multiple black stools for several days, hemoglobin normal at 13 with normal BUN History of chronic iron deficiency anemia History of hiatal hernia, Cameron lesion, small bowel AVMs  Plan: EGD today. Patient is concerned about going home without GI work-up and having to return with further or progressive melena. Discussed about risks and benefits of the procedure with the patient, she understands and verbalizes consent. She is currently on pantoprazole 40 mg every 12 hours.   LOS: 0 days   Ronnette Juniper, MD  11/04/2021, 9:21 AM

## 2021-11-04 NOTE — Transfer of Care (Signed)
Immediate Anesthesia Transfer of Care Note  Patient: Molly Lucas  Procedure(s) Performed: ESOPHAGOGASTRODUODENOSCOPY (EGD) WITH PROPOFOL  Patient Location: Endoscopy Unit  Anesthesia Type:MAC  Level of Consciousness: awake and alert   Airway & Oxygen Therapy: Patient Spontanous Breathing and Patient connected to face mask oxygen  Post-op Assessment: Report given to RN and Post -op Vital signs reviewed and stable  Post vital signs: Reviewed and stable  Last Vitals:  Vitals Value Taken Time  BP    Temp    Pulse 69 11/04/21 1244  Resp 23 11/04/21 1244  SpO2 100 % 11/04/21 1244  Vitals shown include unvalidated device data.  Last Pain:  Vitals:   11/04/21 1200  TempSrc: Oral  PainSc: 0-No pain         Complications: No notable events documented.

## 2021-11-04 NOTE — Progress Notes (Signed)
°  Transition of Care [facility]) Screening Note   Patient Details  Name: Molly Lucas Date of Birth: 1946-03-16   Transition of Care [facility]) CM/SW Contact:    Lennart Pall, LCSW Phone Number: 11/04/2021, 11:25 AM    Transition of Care Department [facility]) has reviewed patient and no TOC needs have been identified at this time. We will continue to monitor patient advancement through interdisciplinary progression rounds. If new patient transition needs arise, please place a TOC consult.

## 2021-11-04 NOTE — Op Note (Signed)
[facility] Patient Name: Molly Lucas Procedure Date: 11/04/2021 MRN: [id number] Attending MD: Ronnette Juniper , MD Date of Birth: 04/21/1946 CSN: [id number] Age: 75 Admit Type: Inpatient Procedure:                Upper GI endoscopy Indications:              Heme positive stool, black stools, history of IDA                            requring IV iron infusions and blood transfusions                            in the past Providers:                Ronnette Juniper, MD, Jaci Carrel, RN, Cherylynn Ridges,                            Technician, Danley Danker, CRNA Referring MD:             [facility] Medicines:                Monitored Anesthesia Care Complications:            No immediate complications. Estimated Blood Loss:     Estimated blood loss: none. Procedure:                Pre-Anesthesia Assessment:                           - Prior to the procedure, a History and Physical                            was performed, and patient medications and                            allergies were reviewed. The patient's tolerance of                            previous anesthesia was also reviewed. The risks                            and benefits of the procedure and the sedation                            options and risks were discussed with the patient.                            All questions were answered, and informed consent                            was obtained. Prior Anticoagulants: The patient has                            taken no previous anticoagulant or antiplatelet  agents. ASA Grade Assessment: III - A patient with                            severe systemic disease. After reviewing the risks                            and benefits, the patient was deemed in                            satisfactory condition to undergo the procedure.                           After obtaining informed consent, the endoscope was                             passed under direct vision. Throughout the                            procedure, the patient's blood pressure, pulse, and                            oxygen saturations were monitored continuously. The                            GIF-H190 ([id number]) Olympus endoscope was introduced                            through the mouth, and advanced to the second part                            of duodenum. The upper GI endoscopy was                            accomplished without difficulty. The patient                            tolerated the procedure well. Scope In: Scope Out: Findings:      The upper third of the esophagus and middle third of the esophagus were       normal.      The lower third of the esophagus was moderately tortuous.      The Z-line was regular and was found 33 cm from the incisors.      A 10 cm hiatal hernia with a few Cameron ulcers was found. Significant       looping encountered during passage of scope into the gastric antrum.      The incisura, gastric antrum, prepyloric region of the stomach and       pylorus were normal.      The examined duodenum was normal. Impression:               - Normal upper third of esophagus and middle third                            of esophagus.                           -  Tortuous esophagus.                           - Z-line regular, 33 cm from the incisors.                           - 10 cm hiatal hernia with a few Cameron ulcers.                           - Normal incisura, antrum, prepyloric region of the                            stomach and pylorus.                           - Normal examined duodenum.                           - No specimens collected. Moderate Sedation:      Patient did not receive moderate sedation for this procedure, but       instead received monitored anesthesia care. Recommendation:           - Advance diet as tolerated.                           - Continue present medications.                            - Consider hiatal hernia repair as patient likely                            has chronic blood loss from cameron lesions. Procedure Code(s):        --- Professional ---                           [phone], Esophagogastroduodenoscopy, flexible,                            transoral; diagnostic, including collection of                            specimen(s) by brushing or washing, when performed                            (separate procedure) Diagnosis Code(s):        --- Professional ---                           Q39.9, Congenital malformation of esophagus,                            unspecified                           K44.9, Diaphragmatic hernia without obstruction or  gangrene                           K25.9, Gastric ulcer, unspecified as acute or                            chronic, without hemorrhage or perforation                           R19.5, Other fecal abnormalities CPT copyright 2019 [employer]. All rights reserved. The codes documented in this report are preliminary and upon coder review may  be revised to meet current compliance requirements. Ronnette Juniper, MD 11/04/2021 12:43:35 PM This report has been signed electronically. Number of Addenda: 0

## 2021-11-04 NOTE — H&P (View-Only) (Signed)
[facility] Consult  Referring Provider: [facility] Primary Care Physician:  [name], MD Primary Gastroenterologist: Dr. Verl Dicker  Reason for Consultation: Melena  HPI: Molly Lucas is a 75 y.o. female with history of iron deficiency anemia requiring periodic IV iron infusions presented to the ER with several days of black liquid stools associated with dizziness on getting up. Patient states that her hemoglobin was around 15 after receiving IV iron infusion in the first week of December and was found to have a hemoglobin of around 13 on admission. Patient reports having a capsule endoscopy at [facility] in 2019 and was told that she has several small bowel AVMs which were not bleeding at that point. Prior GI work-up includes endoscopies which have shown  hiatal hernia and Molly Lucas lesion-EGD 12/2020 Colonoscopy 03/2019 unremarkable  Patient denies use of NSAIDs or aspirin. She takes pantoprazole 40 mg, supposed to take it daily but forgets it intermittently. Denies acid reflux, heartburn, difficulty swallowing, pain on swallowing, unintentional weight loss, early satiety, bloating or loss of appetite.  Past Medical History:  Diagnosis Date   Anemia    Takes iron supplement   Anxiety    Arthritis    Chronic pain of left thumb    Constipation    Deformity of left thumb joint    Z collapse deformity to nondominant left thumb   Depression    Fibroid, uterine    GERD (gastroesophageal reflux disease)    History of blood transfusion    Hx of seasonal allergies    Hyperlipemia    Hypertension    hx of off medication now.   OA (osteoarthritis) of hip    left   Sleep apnea    dentist made mouthipiece     Past Surgical History:  Procedure Laterality Date   BIOPSY  03/30/2019   Procedure: BIOPSY;  Surgeon: Thornton Park, MD;  Location: [facility];  Service: Gastroenterology;;   [place]  08/2015   with abductor  pollicis longus tendon transfer and suspensionplasty   COLONOSCOPY WITH PROPOFOL N/A 03/31/2019   Procedure: COLONOSCOPY WITH PROPOFOL;  Surgeon: Thornton Park, MD;  Location: [place] [facility];  Service: Gastroenterology;  Laterality: N/A;   DE QUERVAIN'S RELEASE  08/2015   DILATATION & CURRETTAGE/HYSTEROSCOPY WITH RESECTOCOPE  2007   ESOPHAGOGASTRODUODENOSCOPY N/A 11/30/2015   Procedure: ESOPHAGOGASTRODUODENOSCOPY (EGD);  Surgeon: Richmond Campbell, MD;  Location: [facility];  Service: Endoscopy;  Laterality: N/A;   ESOPHAGOGASTRODUODENOSCOPY (EGD) WITH PROPOFOL N/A 03/30/2019   Procedure: ESOPHAGOGASTRODUODENOSCOPY (EGD) WITH PROPOFOL;  Surgeon: Thornton Park, MD;  Location: [facility];  Service: Gastroenterology;  Laterality: N/A;   ESOPHAGOGASTRODUODENOSCOPY (EGD) WITH PROPOFOL N/A 12/31/2020   Procedure: ESOPHAGOGASTRODUODENOSCOPY (EGD) WITH PROPOFOL;  Surgeon: Irene Shipper, MD;  Location: [facility];  Service: Endoscopy;  Laterality: N/A;   HERNIA REPAIR Left 06/2012   INGUINAL HERNIA REPAIR Right 05/03/2013   Procedure: HERNIA REPAIR INGUINAL ADULT;  Surgeon: Harl Bowie, MD;  Location: [facility];  Service: General;  Laterality: Right;   INGUINAL HERNIA REPAIR Right 08/23/2013   Procedure: LAPAROSCOPIC INGUINAL HERNIA;  Surgeon: Harl Bowie, MD;  Location: [facility];  Service: General;  Laterality: Right;   INSERTION OF MESH Right 05/03/2013   Procedure: INSERTION OF MESH;  Surgeon: Harl Bowie, MD;  Location: [employer];  Service: General;  Laterality: Right;   INSERTION OF MESH Right 08/23/2013   Procedure: INSERTION OF MESH;  Surgeon: Harl Bowie, MD;  Location: [facility];  Service: General;  Laterality:  Right;   METACARPOPHALANGEAL JOINT ARTHRODESIS  08/2015   with local bone graft   TONSILLECTOMY     TOTAL HIP ARTHROPLASTY  06/24/2012   Procedure: TOTAL HIP ARTHROPLASTY ANTERIOR APPROACH;  Surgeon: Mcarthur Rossetti, MD;  Location: [facility];  Service: Orthopedics;   Laterality: Left;  Left total hip arthroplasty, anterior approach   UTERINE FIBROID SURGERY  2007   WISDOM TOOTH EXTRACTION      Prior to Admission medications   Medication Sig Start Date End Date Taking? Authorizing Provider  BIOTIN PO Take 1 tablet by mouth daily.   Yes [provider]  Biotin w/ Vitamins C & E (HAIR SKIN & NAILS GUMMIES PO) Take 1 Piece by mouth daily.   Yes [provider]  CALCIUM PO Take 1,000 mg by mouth daily.   Yes [provider]  cetirizine (ZYRTEC) 10 MG tablet Take 10 mg by mouth in the morning and at bedtime.   Yes [provider]  Cholecalciferol (VITAMIN D3) 250 MCG (10000 UT) TABS Take 10,000 Units by mouth daily.    Yes [provider]  COLLAGEN PO Take 1 capsule by mouth daily.   Yes [provider]  cyanocobalamin 1000 MCG tablet Take 1 tablet by mouth daily.   Yes [provider]  NON FORMULARY Take 1 capsule by mouth See admin instructions. Black Cumiun oil capsules: Take 1 capsule by mouth once a day   Yes [provider]  Omega-3 1000 MG CAPS Take 2,000 mg by mouth daily.    Yes [provider]  pantoprazole (PROTONIX) 20 MG tablet 20 mg daily. 08/20/21  Yes [provider]  pravastatin (PRAVACHOL) 20 MG tablet Take 20 mg by mouth at bedtime.    Yes [provider]  Turmeric 500 MG CAPS Take 500 mg by mouth daily. Tumeric with cucumin   Yes [provider]  VIIBRYD 40 MG TABS Take 40 mg by mouth daily. 12/24/20  Yes [provider]  vitamin C (ASCORBIC ACID) 500 MG tablet Take 1,000 mg by mouth daily.   Yes [provider]    Current Facility-Administered Medications  Medication Dose Route Frequency Provider Last Rate Last Admin   acetaminophen (TYLENOL) tablet 650 mg  650 mg Oral Q6H PRN Reubin Milan, MD       Or   acetaminophen (TYLENOL) suppository 650 mg  650 mg Rectal Q6H PRN Reubin Milan, MD        loratadine (CLARITIN) tablet 10 mg  10 mg Oral QHS Reubin Milan, MD   10 mg at 11/03/21 2338   ondansetron [facility]) tablet 4 mg  4 mg Oral Q6H PRN Reubin Milan, MD       Or   ondansetron [facility]) injection 4 mg  4 mg Intravenous Q6H PRN Reubin Milan, MD       pantoprazole (PROTONIX) injection 40 mg  40 mg Intravenous Q12H Reubin Milan, MD   40 mg at 11/03/21 1704   pravastatin (PRAVACHOL) tablet 20 mg  20 mg Oral QHS Reubin Milan, MD   20 mg at 11/03/21 2226   Vilazodone HCl TABS 40 mg  40 mg Oral Daily Reubin Milan, MD        Allergies as of 11/03/2021 - Review Complete 11/03/2021  Allergen Reaction Noted   Penicillins Swelling and Other (See Comments) 04/25/2012    Family History  Problem Relation Age of Onset   Heart failure Mother  Osteoporosis Mother    Arthritis Mother    CAD Maternal Grandfather    Heart failure Maternal Grandfather    Dementia Maternal Grandfather    CAD Paternal Grandmother    CAD Paternal Grandfather    Arthritis Sister     Social History   Socioeconomic History   Marital status: Divorced    Spouse name: Not on file   Number of children: Not on file   Years of education: Not on file   Highest education level: Not on file  Occupational History   Not on file  Tobacco Use   Smoking status: Never   Smokeless tobacco: Never  Vaping Use   Vaping Use: Never used  Substance and Sexual Activity   Alcohol use: Yes    Alcohol/week: 2.0 standard drinks    Types: 2 Glasses of wine per week    Comment: occasional glass of wine    Drug use: No   Sexual activity: Not on file  Other Topics Concern   Not on file  Social History Narrative   Not on file   Social Determinants of Health   Financial Resource Strain: Not on file  Food Insecurity: Not on file  Transportation Needs: Not on file  Physical Activity: Not on file  Stress: Not on file  Social Connections: Not on file  Intimate Partner Violence: Not  on file    Review of Systems: Positive for: GI: Described in detail in HPI.    Gen: Denies any fever, chills, rigors, night sweats, anorexia, fatigue, weakness, malaise, involuntary weight loss, and sleep disorder CV: Denies chest pain, angina, palpitations, syncope, orthopnea, PND, peripheral edema, and claudication. Resp: Denies dyspnea, cough, sputum, wheezing, coughing up blood. GU : Denies urinary burning, blood in urine, urinary frequency, urinary hesitancy, nocturnal urination, and urinary incontinence. MS: Bilateral joint deformity of the hands from arthritis Derm: Denies rash, itching, oral ulcerations, hives, unhealing ulcers.  Psych: Denies depression, anxiety, memory loss, suicidal ideation, hallucinations,  and confusion. Heme: Denies bruising, bleeding, and enlarged lymph nodes. Neuro:  Denies any headaches, dizziness, paresthesias. Endo:  Denies any problems with DM, thyroid, adrenal function.  Physical Exam: Vital signs in last 24 hours: Temp:  [97.8 F (36.6 C)-98.2 F (36.8 C)] 98.2 F (36.8 C) (12/27 0415) Pulse Rate:  [60-77] 67 (12/27 0415) Resp:  [10-29] 15 (12/27 0415) BP: (101-144)/(50-101) 112/82 (12/27 0415) SpO2:  [96 %-100 %] 98 % (12/27 0415) Weight:  [91.6 kg] 91.6 kg (12/26 2300) Last BM Date: 11/03/21  General:   Alert,  Well-developed, well-nourished, pleasant and cooperative in NAD Head:  Normocephalic and atraumatic. Eyes:  Sclera clear, no icterus.   Conjunctiva pink. Ears:  Normal auditory acuity. Nose:  No deformity, discharge,  or lesions. Mouth:  No deformity or lesions.  Oropharynx pink & moist. Neck:  Supple; no masses or thyromegaly. Lungs:  Clear throughout to auscultation.   No wheezes, crackles, or rhonchi. No acute distress. Heart:  Regular rate and rhythm; no murmurs, clicks, rubs,  or gallops. Extremities: No edema, changes of arthritis noted in bilateral hands Neurologic:  Alert and  oriented x4;  grossly normal  neurologically. Skin:  Intact without significant lesions or rashes. Psych:  Alert and cooperative. Normal mood and affect. Abdomen:  Soft, nontender and nondistended. No masses, hepatosplenomegaly or hernias noted. Normal bowel sounds, without guarding, and without rebound.         Lab Results: Recent Labs    11/03/21 1245 11/03/21 1255 11/03/21 1835 11/04/21 0031  11/04/21 0539  WBC 6.2  --   --   --   --   HGB 13.7   < > 13.0 11.2* 13.0  HCT 41.2   < > 40.0 33.9* 39.4  PLT 201  --   --   --   --    < > = values in this interval not displayed.   BMET Recent Labs    11/03/21 1245 11/03/21 1255  NA 138 141  K 3.5 3.6  CL 108 107  CO2 24  --   GLUCOSE 115* 109*  BUN 17 17  CREATININE 0.61 0.60  CALCIUM 9.6  --    LFT Recent Labs    11/03/21 1245  PROT 7.0  ALBUMIN 4.2  AST 31  ALT 47*  ALKPHOS 88  BILITOT 1.3*   PT/INR No results for input(s): LABPROT, INR in the last 72 hours.  Studies/Results: No results found.  Impression: Multiple black stools for several days, hemoglobin normal at 13 with normal BUN History of chronic iron deficiency anemia History of hiatal hernia, Cameron lesion, small bowel AVMs  Plan: EGD today. Patient is concerned about going home without GI work-up and having to return with further or progressive melena. Discussed about risks and benefits of the procedure with the patient, she understands and verbalizes consent. She is currently on pantoprazole 40 mg every 12 hours.   LOS: 0 days   Ronnette Juniper, MD  11/04/2021, 9:21 AM

## 2021-11-04 NOTE — Discharge Summary (Signed)
Physician Discharge Summary   Patient: Molly Lucas MRN: [id number] DOB: @DOB   Admit date:     11/03/2021  Discharge date: 11/04/21  Discharge Physician: Edwin Dada   PCP: [name], MD   Recommendations at discharge: 1. Follow up with Dr. Earlean Shawl for evaluation of hiatal hernia            Discharge Diagnoses Principal Problem:   Melena Active Problems:   HLD (hyperlipidemia)   Iron deficiency anemia   Chronic gastrointestinal bleeding   GERD (gastroesophageal reflux disease)   Depression   Essential hypertension     Hospital Course   [name] is a 75 y.o. F with anemia, anxiety, depression, HTN, OSA and arthritis who presented with several days melena and now dizziness with standing.  In the ER, vital signs stable.  FOBT+, Hgb 13.   Patient was admitted and observed overnight.  Serial Hgb stable.    Evaluated by Dr. Therisa Doyne from [facility] in the morning, who recommended endoscopy.   Endoscopy showed no active bleeding, no stigmata of recent bleeding.  Dr. Therisa Doyne felt the large hiatal hernia, [name] lesions were the source of chronic blood loss.  She remained hemodynamically stable and was discharged in good condition.                    Consultants: [facility] Procedures performed: EGD  Findings:      The upper third of the esophagus and middle third of the esophagus were       normal.      The lower third of the esophagus was moderately tortuous.      The Z-line was regular and was found 33 cm from the incisors.      A 10 cm hiatal hernia with a few Cameron ulcers was found. Significant       looping encountered during passage of scope into the gastric antrum.      The incisura, gastric antrum, prepyloric region of the stomach and       pylorus were normal.      The examined duodenum was normal. Impression:               - Normal upper third of esophagus and middle third                            of esophagus.                            - Tortuous esophagus.                           - Z-line regular, 33 cm from the incisors.                           - 10 cm hiatal hernia with a few Cameron ulcers.                           - Normal incisura, antrum, prepyloric region of the                            stomach and pylorus.                           -  Normal examined duodenum.                           - No specimens collected. Moderate Sedation:      Patient did not receive moderate sedation for this procedure, but       instead received monitored anesthesia care. Recommendation:           - Advance diet as tolerated.                           - Continue present medications.                           - Consider hiatal hernia repair as patient likely                            has chronic blood loss from cameron lesions.     Disposition: Home Diet recommendation: Regular diet  DISCHARGE MEDICATION: Allergies as of 11/04/2021       Reactions   Nsaids    Patient suffers from sever GI bleeds.   Penicillins Swelling, Other (See Comments)   Tongue became swollen and turned black Did it involve swelling of the face/tongue/throat, SOB, or low BP? Yes Did it involve sudden or severe rash/hives, skin peeling, or any reaction on the inside of your mouth or nose? Yes Did you need to seek medical attention at a hospital or doctor's office? Yes When did it last happen? "I was a child"   If all above answers are "NO", may proceed with cephalosporin use.   Other    Bug bites-per patient, any bug that she has been bitten by will cause her anxiety, dermatitis, hives, itching, nausea and swelling.        Medication List     TAKE these medications    BIOTIN PO Take 1 tablet by mouth daily.   CALCIUM PO Take 1,000 mg by mouth daily.   cetirizine 10 MG tablet Commonly known as: ZYRTEC Take 10 mg by mouth in the morning and at bedtime.   COLLAGEN PO Take 1 capsule by mouth daily.    cyanocobalamin 1000 MCG tablet Take 1 tablet by mouth daily.   HAIR SKIN & NAILS GUMMIES PO Take 1 Piece by mouth daily.   NON FORMULARY Take 1 capsule by mouth See admin instructions. Black Cumiun oil capsules: Take 1 capsule by mouth once a day   Omega-3 1000 MG Caps Take 2,000 mg by mouth daily.   pantoprazole 20 MG tablet Commonly known as: PROTONIX 20 mg daily.   pravastatin 20 MG tablet Commonly known as: PRAVACHOL Take 20 mg by mouth at bedtime.   Turmeric 500 MG Caps Take 500 mg by mouth daily. Tumeric with cucumin   Viibryd 40 MG Tabs Generic drug: Vilazodone HCl Take 40 mg by mouth daily.   vitamin C 500 MG tablet Commonly known as: ASCORBIC ACID Take 1,000 mg by mouth daily.   Vitamin D3 250 MCG (10000 UT) Tabs Take 10,000 Units by mouth daily.        Follow-up Information     Richmond Campbell, MD Follow up.   Specialty: Gastroenterology Contact information: [place] [address] [phone]         [name], MD Follow up.   Specialty: Internal Medicine Contact information: [address] [place] [address] [phone]                Discharge Instructions     Discharge instructions   Complete by: As directed    From Dr. Loleta Books: You were admitted for dark stools.  Thankfully, here, your blood level remained stable, and your Hemoglobin at discharge was 13 g/dL. On your endoscopy, Dr. Therisa Doyne found nothing concerning.  On your endoscopy, the [name] lesions from your hiatal hernia were the most likely culprit.  Continue your current medications Your ferritin level (iron stores) was normal in early December, so you have adequate iron stores to recover from this episode. Continue your regular follow up with Dr. Irene Limbo.  Go see Dr. Philip Aspen and Dr. Earlean Shawl.  Call Dr. Liliane Channel office tomorrow for a follow up appointment.   Increase activity slowly   Complete by: As directed          Discharge  Exam: Filed Weights   11/03/21 2300 11/04/21 1200  Weight: 91.6 kg 91.6 kg   General: Pt is alert, awake, not in acute distress Cardiovascular: RRR, nl S1-S2, no murmurs appreciated.   No LE edema.   Respiratory: Normal respiratory rate and rhythm.  CTAB without rales or wheezes. Abdominal: Abdomen soft and non-tender.  No distension or HSM.   Neuro/Psych: Strength symmetric in upper and lower extremities.  Judgment and insight appear normal.   Condition at discharge: good  The results of significant diagnostics from this hospitalization (including imaging, microbiology, ancillary and laboratory) are listed below for reference.   Imaging Studies: XR KNEE 3 VIEW RIGHT  Result Date: 10/13/2021 Radiographed of her right knee were removed today.  Cannot appreciate any acute osseous injuries.  She does have tricompartmental arthritis with joint space narrowing more laterally than medially.  Patellofemoral joint is bone-on-bone arthritis with osteophyte at the superior pole which is fractured undetermined age.  She has some calcifications in the posterior compartment do not look new.  Sclerotic changes in the tibia both medially and lateral   Microbiology: Results for orders placed or performed during the hospital encounter of 11/03/21  Resp Panel by RT-PCR (Flu A&B, Covid) Nasopharyngeal Swab     Status: None   Collection Time: 11/03/21  7:33 PM   Specimen: Nasopharyngeal Swab; Nasopharyngeal(NP) swabs in vial transport medium  Result Value Ref Range Status   SARS Coronavirus 2 by RT PCR NEGATIVE NEGATIVE Final    Comment: (NOTE) SARS-CoV-2 target nucleic acids are NOT DETECTED.  The SARS-CoV-2 RNA is generally detectable in upper respiratory specimens during the acute phase of infection. The lowest concentration of SARS-CoV-2 viral copies this assay can detect is 138 copies/mL. A negative result does not preclude SARS-Cov-2 infection and should not be used as the sole basis for  treatment or other patient management decisions. A negative result may occur with  improper specimen collection/handling, submission of specimen other than nasopharyngeal swab, presence of viral mutation(s) within the areas targeted by this assay, and inadequate number of viral copies(<138 copies/mL). A negative result must be combined with clinical observations, patient history, and epidemiological information. The expected result is Negative.  Fact Sheet for Patients:  EntrepreneurPulse.com.au  Fact Sheet for Healthcare Providers:  IncredibleEmployment.be  This test is no t yet approved or cleared by the [place] FDA and  has been authorized for detection and/or diagnosis of SARS-CoV-2 by FDA under an Emergency Use Authorization (EUA). This EUA will remain  in effect (meaning this test can be used) for  the duration of the COVID-19 declaration under Section 564(b)(1) of the Act, 21 U.S.C.section 360bbb-3(b)(1), unless the authorization is terminated  or revoked sooner.       Influenza A by PCR NEGATIVE NEGATIVE Final   Influenza B by PCR NEGATIVE NEGATIVE Final    Comment: (NOTE) The Xpert Xpress SARS-CoV-2/FLU/RSV plus assay is intended as an aid in the diagnosis of influenza from Nasopharyngeal swab specimens and should not be used as a sole basis for treatment. Nasal washings and aspirates are unacceptable for Xpert Xpress SARS-CoV-2/FLU/RSV testing.  Fact Sheet for Patients: EntrepreneurPulse.com.au  Fact Sheet for Healthcare Providers: IncredibleEmployment.be  This test is not yet approved or cleared by the [place] FDA and has been authorized for detection and/or diagnosis of SARS-CoV-2 by FDA under an Emergency Use Authorization (EUA). This EUA will remain in effect (meaning this test can be used) for the duration of the COVID-19 declaration under Section 564(b)(1) of the Act, 21  U.S.C. section 360bbb-3(b)(1), unless the authorization is terminated or revoked.  Performed at [facility], [place] [address] [place], [place] [address]     Labs: CBC: Recent Labs  Lab 11/03/21 1245 11/03/21 1255 11/03/21 1835 11/04/21 0031 11/04/21 0539  WBC 6.2  --   --   --   --   NEUTROABS 3.8  --   --   --   --   HGB 13.7 14.3 13.0 11.2* 13.0  HCT 41.2 42.0 40.0 33.9* 39.4  MCV 99.0  --   --   --   --   PLT 201  --   --   --   --    Basic Metabolic Panel: Recent Labs  Lab 11/03/21 1245 11/03/21 1255  NA 138 141  K 3.5 3.6  CL 108 107  CO2 24  --   GLUCOSE 115* 109*  BUN 17 17  CREATININE 0.61 0.60  CALCIUM 9.6  --    Liver Function Tests: Recent Labs  Lab 11/03/21 1245  AST 31  ALT 47*  ALKPHOS 88  BILITOT 1.3*  PROT 7.0  ALBUMIN 4.2   CBG: No results for input(s): GLUCAP in the last 168 hours.  Discharge time spent: greater than 30 minutes.  Signed:  Edwin Dada MD.  [facility] 11/04/2021

## 2021-11-04 NOTE — Hospital Course (Signed)
Molly Lucas is a 75 y.o. F with anemia, anxiety, depression, HTN, OSA and arthritis who presented with several days melena and now dizziness with standing.  In the ER, vital signs stable.  Hgb 13.

## 2021-11-04 NOTE — Anesthesia Postprocedure Evaluation (Signed)
Anesthesia Post Note  Patient: Molly Lucas [facility]  Procedure(s) Performed: ESOPHAGOGASTRODUODENOSCOPY (EGD) WITH PROPOFOL     Patient location during evaluation: PACU Anesthesia Type: MAC Level of consciousness: awake and alert Pain management: pain level controlled Vital Signs Assessment: post-procedure vital signs reviewed and stable Respiratory status: spontaneous breathing, nonlabored ventilation, respiratory function stable and patient connected to nasal cannula oxygen Cardiovascular status: stable and blood pressure returned to baseline Postop Assessment: no apparent nausea or vomiting Anesthetic complications: no   No notable events documented.  Last Vitals:  Vitals:   11/04/21 1310 11/04/21 1322  BP: 138/80 119/69  Pulse: 69 67  Resp: 19 20  Temp:    SpO2: 95% 97%    Last Pain:  Vitals:   11/04/21 1322  TempSrc:   PainSc: 0-No pain                 Derian Pfost

## 2021-11-04 NOTE — Anesthesia Preprocedure Evaluation (Signed)
Anesthesia Evaluation  Patient identified by MRN, date of birth, ID band Patient awake    Reviewed: Allergy & Precautions, NPO status , Patient's Chart, lab work & pertinent test results  History of Anesthesia Complications Negative for: history of anesthetic complications  Airway Mallampati: I  TM Distance: >3 FB Neck ROM: Full    Dental  (+) Caps, Dental Advisory Given, Teeth Intact   Pulmonary shortness of breath, sleep apnea ,    breath sounds clear to auscultation       Cardiovascular hypertension, Pt. on medications  Rhythm:Regular Rate:Normal  '17 ECHO: EF 55-60%, mild-mod MR, mild TR '17 Stress: EF 65%, mod fixed inferior defect consistent with diaphragmatic attenuation, no ischemia   Neuro/Psych Anxiety Depression negative neurological ROS     GI/Hepatic Neg liver ROS, PUD, GERD  Medicated and Controlled,  Endo/Other  obese  Renal/GU negative Renal ROS     Musculoskeletal  (+) Arthritis , Osteoarthritis,    Abdominal (+) + obese,   Peds  Hematology  (+) Blood dyscrasia (Hb 7.9), anemia ,   Anesthesia Other Findings HPI: Molly Lucas is a 75 y.o. female with medical history significant of anemia, anxiety,*Tritus, chronic pain of the left thumb, constipation, depression, uterine fibroids, GERD, seasonal allergies, hypertension, hyperlipidemia, osteoarthritis of the left hip, sleep apnea who is coming to the emergency department due to multiple episodes of melena in the last few days associated with progressively worse postural dizziness.    Reproductive/Obstetrics                             Anesthesia Physical  Anesthesia Plan  ASA: 3 and emergent  Anesthesia Plan: MAC   Post-op Pain Management: Minimal or no pain anticipated   Induction: Intravenous  PONV Risk Score and Plan: 2 and Treatment may vary due to age or medical condition  Airway Management Planned: Natural  Airway and Simple Face Mask  Additional Equipment:   Intra-op Plan:   Post-operative Plan:   Informed Consent: I have reviewed the patients History and Physical, chart, labs and discussed the procedure including the risks, benefits and alternatives for the proposed anesthesia with the patient or authorized representative who has indicated his/her understanding and acceptance.     Dental advisory given  Plan Discussed with: CRNA, Surgeon and Anesthesiologist  Anesthesia Plan Comments:         Anesthesia Quick Evaluation

## 2021-11-04 NOTE — Plan of Care (Signed)
Pt ready to DC back to [facility]

## 2021-11-05 ENCOUNTER — Ambulatory Visit (INDEPENDENT_AMBULATORY_CARE_PROVIDER_SITE_OTHER): Payer: Medicare Other

## 2021-11-05 ENCOUNTER — Ambulatory Visit (INDEPENDENT_AMBULATORY_CARE_PROVIDER_SITE_OTHER): Payer: Medicare Other | Admitting: Orthopaedic Surgery

## 2021-11-05 ENCOUNTER — Encounter (HOSPITAL_COMMUNITY): Payer: Self-pay | Admitting: Gastroenterology

## 2021-11-05 DIAGNOSIS — Z96642 Presence of left artificial hip joint: Secondary | ICD-10-CM | POA: Diagnosis not present

## 2021-11-05 NOTE — Progress Notes (Signed)
Office Visit Note   Patient: Molly Lucas           Date of Birth: 05-Feb-1946           MRN: [id number] Visit Date: 11/05/2021              Requested by: [name], MD [place],  [place] [id number] PCP: [name], MD   Assessment & Plan: Visit Diagnoses:  1. History of left hip replacement     Plan: We had a long and thorough discussion about her left hip today and she understands that what she is seeing in terms of a difference of the left side worse than the right side is totally related to her significant and severe scoliosis and how this affects her posture and causes that side of her body to jot out more.  I agree with her continuing her exercise and swimming routine.  All questions and concerns were answered and addressed.  Follow-up will be as needed.  Follow-Up Instructions: Return if symptoms worsen or fail to improve.   Orders:  Orders Placed This Encounter  Procedures   XR HIP UNILAT W OR W/O PELVIS 1V LEFT   No orders of the defined types were placed in this encounter.     Procedures: No procedures performed   Clinical Data: No additional findings.   Subjective: Chief Complaint  Patient presents with   Left Hip - Pain  The patient is well-known to me.  We actually replaced her left hip in 2013 due to severe osteoarthritis of the left hip.  She does stay at [facility].  She does enjoy getting in their pool for exercise.  She is 75 years old.  She does have issues with the left hip itself in terms of she feels there is asymmetry on that side of her body.  Of note she does have a significant worsening scoliosis of the lumbar spine and that has really changed her posture and that is what is affected her joint more than anything on that side.  She denies any groin pain and any specific issues with the hip replacement itself.  She does occasionally have itching on the lateral aspect of her hip but no significant pain  at all.  She is also been seen recently for right knee pain.  She has been having some acute pain in her right knee but is better now.  She had had a knee brace recommended for her but did let me know that she does not drive and we need to take in consideration knee braces for patients who have no means of getting to where we are recommending them get a brace.  She did get out of the hospital yesterday secondary to anemia that has to be worked up on occasion when she develops melena.  HPI  Review of Systems She currently today denies a headache, chest pain, shortness of breath, fever, chills, nausea, vomiting  Objective: Vital Signs: There were no vitals taken for this visit.  Physical Exam She is alert and orient x3 and in no acute distress Ortho Exam Examination of her left hip shows it moves smoothly and fluidly.  I did look at her posture and she definitely leans more to the left side and when I get her to stand straight and work on her posture both hips look equal to me.  She understands that this is an issue more with her scoliosis than anything.  The incision is  well-healed and there is no rash around the skin and the left operative hip moves smoothly and fluidly.  Her right knee surprisingly does not have significant patellofemoral crepitation and no pain today in spite of her x-ray findings. Specialty Comments:  No specialty comments available.  Imaging: XR HIP UNILAT W OR W/O PELVIS 1V LEFT  Result Date: 11/05/2021 An AP pelvis and lateral left hip shows a total hip arthroplasty with no complicating features.    PMFS History: Patient Active Problem List   Diagnosis Date Noted   Rectal bleeding 11/03/2021   Hiatal hernia    Chronic gastric erosion    Essential hypertension 96/78/9381   Diastolic dysfunction 01/75/1025   Systolic murmur 85/27/7824   Red blood cell antibody positive 09/18/2019   GERD (gastroesophageal reflux disease) 05/19/2019   Depression 05/19/2019    History of gastric ulcer 03/29/2019   Insomnia 03/29/2019   Chronic gastrointestinal bleeding 03/29/2019   Excessive daytime sleepiness 06/20/2018   SOB (shortness of breath) on exertion 06/20/2018   Fatigue due to depression 06/20/2018   OSA (obstructive sleep apnea) 06/20/2018   Trigger thumb of right hand 04/29/2017   Iron deficiency anemia 04/09/2016   HLD (hyperlipidemia) 02/26/2016   Family history of heart disease 02/26/2016   Trivial Pericardial effusion 02/26/2016   Anemia 11/29/2015   Symptomatic anemia 11/29/2015   Dyspnea    Melena    Occult blood in stools    Primary osteoarthritis of first carpometacarpal joint of left hand 09/30/2015   Chronic pain of left thumb 08/01/2015   Recurrent right inguinal hernia 08/15/2013   Right inguinal hernia 03/14/2013   Degenerative arthritis of hip 06/24/2012   Past Medical History:  Diagnosis Date   Anemia    Takes iron supplement   Anxiety    Arthritis    Chronic pain of left thumb    Constipation    Deformity of left thumb joint    Z collapse deformity to nondominant left thumb   Depression    Fibroid, uterine    GERD (gastroesophageal reflux disease)    History of blood transfusion    Hx of seasonal allergies    Hyperlipemia    Hypertension    hx of off medication now.   OA (osteoarthritis) of hip    left   Sleep apnea    dentist made mouthipiece     Family History  Problem Relation Age of Onset   Heart failure Mother    Osteoporosis Mother    Arthritis Mother    CAD Maternal Grandfather    Heart failure Maternal Grandfather    Dementia Maternal Grandfather    CAD Paternal Grandmother    CAD Paternal Grandfather    Arthritis Sister     Past Surgical History:  Procedure Laterality Date   BIOPSY  03/30/2019   Procedure: BIOPSY;  Surgeon: Thornton Park, MD;  Location: [facility];  Service: Gastroenterology;;   Mortimer Fries SUSPENSION [facility]  08/2015   with abductor pollicis longus tendon transfer  and suspensionplasty   COLONOSCOPY WITH PROPOFOL N/A 03/31/2019   Procedure: COLONOSCOPY WITH PROPOFOL;  Surgeon: Thornton Park, MD;  Location: [facility];  Service: Gastroenterology;  Laterality: N/A;   DE QUERVAIN'S RELEASE  08/2015   DILATATION & CURRETTAGE/HYSTEROSCOPY WITH RESECTOCOPE  2007   ESOPHAGOGASTRODUODENOSCOPY N/A 11/30/2015   Procedure: ESOPHAGOGASTRODUODENOSCOPY (EGD);  Surgeon: Richmond Campbell, MD;  Location: [facility];  Service: Endoscopy;  Laterality: N/A;   ESOPHAGOGASTRODUODENOSCOPY (EGD) WITH PROPOFOL N/A 03/30/2019   Procedure: ESOPHAGOGASTRODUODENOSCOPY (EGD) WITH  PROPOFOL;  Surgeon: Thornton Park, MD;  Location: [facility];  Service: Gastroenterology;  Laterality: N/A;   ESOPHAGOGASTRODUODENOSCOPY (EGD) WITH PROPOFOL N/A 12/31/2020   Procedure: ESOPHAGOGASTRODUODENOSCOPY (EGD) WITH PROPOFOL;  Surgeon: Irene Shipper, MD;  Location: [facility];  Service: Endoscopy;  Laterality: N/A;   ESOPHAGOGASTRODUODENOSCOPY (EGD) WITH PROPOFOL N/A 11/04/2021   Procedure: ESOPHAGOGASTRODUODENOSCOPY (EGD) WITH PROPOFOL;  Surgeon: Ronnette Juniper, MD;  Location: [facility];  Service: Gastroenterology;  Laterality: N/A;   HERNIA REPAIR Left 06/2012   INGUINAL HERNIA REPAIR Right 05/03/2013   Procedure: HERNIA REPAIR INGUINAL ADULT;  Surgeon: Harl Bowie, MD;  Location: [facility];  Service: General;  Laterality: Right;   INGUINAL HERNIA REPAIR Right 08/23/2013   Procedure: LAPAROSCOPIC INGUINAL HERNIA;  Surgeon: Harl Bowie, MD;  Location: [facility];  Service: General;  Laterality: Right;   INSERTION OF MESH Right 05/03/2013   Procedure: INSERTION OF MESH;  Surgeon: Harl Bowie, MD;  Location: [facility];  Service: General;  Laterality: Right;   INSERTION OF MESH Right 08/23/2013   Procedure: INSERTION OF MESH;  Surgeon: Harl Bowie, MD;  Location: [facility];  Service: General;  Laterality: Right;   METACARPOPHALANGEAL JOINT ARTHRODESIS  08/2015   with local bone graft    TONSILLECTOMY     TOTAL HIP ARTHROPLASTY  06/24/2012   Procedure: TOTAL HIP ARTHROPLASTY ANTERIOR APPROACH;  Surgeon: Mcarthur Rossetti, MD;  Location: [facility];  Service: Orthopedics;  Laterality: Left;  Left total hip arthroplasty, anterior approach   UTERINE FIBROID SURGERY  2007   WISDOM TOOTH EXTRACTION     Social History   Occupational History   Not on file  Tobacco Use   Smoking status: Never   Smokeless tobacco: Never  Vaping Use   Vaping Use: Never used  Substance and Sexual Activity   Alcohol use: Yes    Alcohol/week: 2.0 standard drinks    Types: 2 Glasses of wine per week    Comment: occasional glass of wine    Drug use: No   Sexual activity: Not on file

## 2021-11-20 ENCOUNTER — Other Ambulatory Visit: Payer: Self-pay | Admitting: Internal Medicine

## 2021-11-20 ENCOUNTER — Encounter (HOSPITAL_COMMUNITY): Payer: Self-pay | Admitting: Emergency Medicine

## 2021-11-20 ENCOUNTER — Other Ambulatory Visit: Payer: Self-pay

## 2021-11-20 ENCOUNTER — Observation Stay (HOSPITAL_COMMUNITY)
Admission: EM | Admit: 2021-11-20 | Discharge: 2021-11-21 | Disposition: A | Discharge status: Home / Self Care | Payer: Medicare Other | Attending: Family Medicine | Admitting: Family Medicine

## 2021-11-20 DIAGNOSIS — Z96642 Presence of left artificial hip joint: Secondary | ICD-10-CM | POA: Diagnosis not present

## 2021-11-20 DIAGNOSIS — K921 Melena: Secondary | ICD-10-CM | POA: Insufficient documentation

## 2021-11-20 DIAGNOSIS — K469 Unspecified abdominal hernia without obstruction or gangrene: Secondary | ICD-10-CM | POA: Diagnosis not present

## 2021-11-20 DIAGNOSIS — R42 Dizziness and giddiness: Secondary | ICD-10-CM

## 2021-11-20 DIAGNOSIS — R110 Nausea: Secondary | ICD-10-CM

## 2021-11-20 DIAGNOSIS — R17 Unspecified jaundice: Secondary | ICD-10-CM | POA: Insufficient documentation

## 2021-11-20 DIAGNOSIS — R531 Weakness: Principal | ICD-10-CM | POA: Insufficient documentation

## 2021-11-20 DIAGNOSIS — R195 Other fecal abnormalities: Secondary | ICD-10-CM | POA: Diagnosis present

## 2021-11-20 DIAGNOSIS — K449 Diaphragmatic hernia without obstruction or gangrene: Secondary | ICD-10-CM

## 2021-11-20 DIAGNOSIS — I10 Essential (primary) hypertension: Secondary | ICD-10-CM | POA: Diagnosis not present

## 2021-11-20 DIAGNOSIS — K922 Gastrointestinal hemorrhage, unspecified: Secondary | ICD-10-CM

## 2021-11-20 DIAGNOSIS — Z20822 Contact with and (suspected) exposure to covid-19: Secondary | ICD-10-CM | POA: Insufficient documentation

## 2021-11-20 DIAGNOSIS — Z79899 Other long term (current) drug therapy: Secondary | ICD-10-CM | POA: Diagnosis not present

## 2021-11-20 DIAGNOSIS — K219 Gastro-esophageal reflux disease without esophagitis: Secondary | ICD-10-CM

## 2021-11-20 LAB — PROTIME-INR
INR: 1.1 (ref 0.8–1.2)
Prothrombin Time: 14.1 seconds (ref 11.4–15.2)

## 2021-11-20 LAB — RESP PANEL BY RT-PCR (FLU A&B, COVID) ARPGX2
Influenza A by PCR: NEGATIVE
Influenza B by PCR: NEGATIVE
SARS Coronavirus 2 by RT PCR: NEGATIVE

## 2021-11-20 LAB — COMPREHENSIVE METABOLIC PANEL
ALT: 47 U/L — ABNORMAL HIGH (ref 0–44)
AST: 30 U/L (ref 15–41)
Albumin: 4.2 g/dL (ref 3.5–5.0)
Alkaline Phosphatase: 77 U/L (ref 38–126)
Anion gap: 4 — ABNORMAL LOW (ref 5–15)
BUN: 18 mg/dL (ref 8–23)
CO2: 26 mmol/L (ref 22–32)
Calcium: 9.5 mg/dL (ref 8.9–10.3)
Chloride: 109 mmol/L (ref 98–111)
Creatinine, Ser: 0.71 mg/dL (ref 0.44–1.00)
GFR, Estimated: >60 mL/min (ref >60)
Glucose, Bld: 87 mg/dL (ref 70–99)
Potassium: 4 mmol/L (ref 3.5–5.1)
Sodium: 139 mmol/L (ref 135–145)
Total Bilirubin: 1.5 mg/dL — ABNORMAL HIGH (ref 0.3–1.2)
Total Protein: 7 g/dL (ref 6.5–8.1)

## 2021-11-20 LAB — CBC WITH DIFFERENTIAL/PLATELET
Abs Immature Granulocytes: 0.03 10*3/uL (ref 0.00–0.07)
Basophils Absolute: 0.1 10*3/uL (ref 0.0–0.1)
Basophils Relative: 1 %
Eosinophils Absolute: 0.2 10*3/uL (ref 0.0–0.5)
Eosinophils Relative: 4 %
HCT: 42.9 % (ref 36.0–46.0)
Hemoglobin: 14 g/dL (ref 12.0–15.0)
Immature Granulocytes: 1 %
Lymphocytes Relative: 29 %
Lymphs Abs: 1.6 10*3/uL (ref 0.7–4.0)
MCH: 33.2 pg (ref 26.0–34.0)
MCHC: 32.6 g/dL (ref 30.0–36.0)
MCV: 101.7 fL — ABNORMAL HIGH (ref 80.0–100.0)
Monocytes Absolute: 0.5 10*3/uL (ref 0.1–1.0)
Monocytes Relative: 9 %
Neutro Abs: 3.2 10*3/uL (ref 1.7–7.7)
Neutrophils Relative %: 56 %
Platelets: 188 10*3/uL (ref 150–400)
RBC: 4.22 MIL/uL (ref 3.87–5.11)
RDW: 13.7 % (ref 11.5–15.5)
WBC: 5.5 10*3/uL (ref 4.0–10.5)
nRBC: 0 % (ref 0.0–0.2)

## 2021-11-20 LAB — APTT: aPTT: 27 seconds (ref 24–36)

## 2021-11-20 MED ORDER — SODIUM CHLORIDE 0.9% FLUSH
3.0000 mL | Freq: Two times a day (BID) | INTRAVENOUS | Status: DC
  Administered 2021-11-21: 3 mL via INTRAVENOUS

## 2021-11-20 MED ORDER — ACETAMINOPHEN 325 MG PO TABS: 650.0000 mg | ORAL_TABLET | Freq: Four times a day (QID) | ORAL | Status: DC | PRN

## 2021-11-20 MED ORDER — ACETAMINOPHEN 650 MG RE SUPP: 650.0000 mg | Freq: Four times a day (QID) | RECTAL | Status: DC | PRN

## 2021-11-20 MED ORDER — ONDANSETRON HCL 4 MG/2ML IJ SOLN: 4.0000 mg | Freq: Four times a day (QID) | INTRAMUSCULAR | Status: DC | PRN

## 2021-11-20 MED ORDER — SODIUM CHLORIDE 0.9% FLUSH: 3.0000 mL | INTRAVENOUS | Status: DC | PRN

## 2021-11-20 MED ORDER — ONDANSETRON HCL 4 MG PO TABS: 4.0000 mg | ORAL_TABLET | Freq: Four times a day (QID) | ORAL | Status: DC | PRN

## 2021-11-20 MED ORDER — SENNOSIDES-DOCUSATE SODIUM 8.6-50 MG PO TABS: 1.0000 | ORAL_TABLET | Freq: Every evening | ORAL | Status: DC | PRN

## 2021-11-20 MED ORDER — LACTATED RINGERS IV SOLN: INTRAVENOUS | Status: DC

## 2021-11-20 MED ORDER — LACTATED RINGERS IV BOLUS
1000.0000 mL | Freq: Once | INTRAVENOUS | Status: AC
  Administered 2021-11-20: 1000 mL via INTRAVENOUS

## 2021-11-20 MED ORDER — LORATADINE 10 MG PO TABS
10.0000 mg | ORAL_TABLET | Freq: Every day | ORAL | Status: DC
  Administered 2021-11-21: 10 mg via ORAL
  Filled 2021-11-20: qty 1

## 2021-11-20 MED ORDER — PANTOPRAZOLE SODIUM 20 MG PO TBEC
20.0000 mg | DELAYED_RELEASE_TABLET | Freq: Two times a day (BID) | ORAL | Status: DC
Start: 2021-11-21 — End: 2021-11-21
  Administered 2021-11-21: 20 mg via ORAL
  Filled 2021-11-20 (×3): qty 1

## 2021-11-20 MED ORDER — VILAZODONE HCL 20 MG PO TABS
40.0000 mg | ORAL_TABLET | Freq: Every day | ORAL | Status: DC
  Administered 2021-11-21: 40 mg via ORAL
  Filled 2021-11-20: qty 2

## 2021-11-20 MED ORDER — SODIUM CHLORIDE 0.9 % IV SOLN: 250.0000 mL | INTRAVENOUS | Status: DC | PRN

## 2021-11-20 NOTE — ED Provider Notes (Signed)
[facility] DEPT Provider Note   CSN: [id number] Arrival date & time: 11/20/21  1156     History  No chief complaint on file.   Molly Lucas is a 76 y.o. female.  76 year old female presents with dark stools.  Seen by myself 2 weeks ago for similar symptoms and admitted.  Had a GI evaluation including upper endoscopy which did not show any evidence of bleeding.  Patient states over the last 2 days she has had dark tarry stools.  Denies any blood that she has seen in her stools.  No abdominal pain or fever or emesis.  Does note that she has been short of breath.      Home Medications Prior to Admission medications   Medication Sig Start Date End Date Taking? Authorizing Provider  BIOTIN PO Take 1 tablet by mouth daily.    [provider]  Biotin w/ Vitamins C & E (HAIR SKIN & NAILS GUMMIES PO) Take 1 Piece by mouth daily.    [provider]  CALCIUM PO Take 1,000 mg by mouth daily.    [provider]  cetirizine (ZYRTEC) 10 MG tablet Take 10 mg by mouth in the morning and at bedtime.    [provider]  Cholecalciferol (VITAMIN D3) 250 MCG (10000 UT) TABS Take 10,000 Units by mouth daily.     [provider]  COLLAGEN PO Take 1 capsule by mouth daily.    [provider]  cyanocobalamin 1000 MCG tablet Take 1 tablet by mouth daily.    [provider]  NON FORMULARY Take 1 capsule by mouth See admin instructions. Black [place] oil capsules: Take 1 capsule by mouth once a day    [provider]  Omega-3 1000 MG CAPS Take 2,000 mg by mouth daily.     [provider]  pantoprazole (PROTONIX) 20 MG tablet 20 mg daily. 08/20/21   [provider]  pravastatin (PRAVACHOL) 20 MG tablet Take 20 mg by mouth at bedtime.     [provider]  Turmeric 500 MG CAPS Take 500 mg by mouth daily. Tumeric with cucumin    [provider]  VIIBRYD 40 MG TABS Take 40  mg by mouth daily. 12/24/20   [provider]  vitamin C (ASCORBIC ACID) 500 MG tablet Take 1,000 mg by mouth daily.    [provider]      Allergies    Nsaids, Penicillins, and Other    Review of Systems   Review of Systems  All other systems reviewed and are negative.  Physical Exam Updated Vital Signs BP (!) 134/100    Pulse 64    Temp 98.5 F (36.9 C) (Oral)    Resp 18    SpO2 99%  Physical Exam Vitals and nursing note reviewed.  Constitutional:      General: She is not in acute distress.    Appearance: Normal appearance. She is well-developed. She is not toxic-appearing.  HENT:     Head: Normocephalic and atraumatic.  Eyes:     General: Lids are normal.     Conjunctiva/sclera: Conjunctivae normal.     Pupils: Pupils are equal, round, and reactive to light.  Neck:     Thyroid: No thyroid mass.     Trachea: No tracheal deviation.  Cardiovascular:     Rate and Rhythm: Normal rate and regular rhythm.     Heart sounds: Normal heart sounds. No murmur heard.   No gallop.  Pulmonary:     Effort: Pulmonary effort is normal. No respiratory distress.     Breath sounds: Normal breath sounds. No stridor. No decreased breath sounds, wheezing, rhonchi or rales.  Abdominal:     General: There is no distension.     Palpations: Abdomen is soft.     Tenderness: There is no abdominal tenderness. There is no rebound.  Musculoskeletal:        General: No tenderness. Normal range of motion.     Cervical back: Normal range of motion and neck supple.  Skin:    General: Skin is warm and dry.     Findings: No abrasion or rash.  Neurological:     Mental Status: She is alert and oriented to person, place, and time. Mental status is at baseline.     GCS: GCS eye subscore is 4. GCS verbal subscore is 5. GCS motor subscore is 6.     Cranial Nerves: No cranial nerve deficit.     Sensory: No sensory deficit.     Motor: Motor function is intact.  Psychiatric:        Attention  and Perception: Attention normal.        Speech: Speech normal.        Behavior: Behavior normal.    ED Results / Procedures / Treatments   Labs (all labs ordered are listed, but only abnormal results are displayed) Labs Reviewed  COMPREHENSIVE METABOLIC PANEL - Abnormal; Notable for the following components:      Result Value   ALT 47 (*)    Total Bilirubin 1.5 (*)    Anion gap 4 (*)    All other components within normal limits  CBC WITH DIFFERENTIAL/PLATELET - Abnormal; Notable for the following components:   MCV 101.7 (*)    All other components within normal limits  RESP PANEL BY RT-PCR (FLU A&B, COVID) ARPGX2  PROTIME-INR  APTT  POC OCCULT BLOOD, ED    EKG EKG Interpretation  Date/Time:  Thursday November 20 2021 14:38:07 EST Ventricular Rate:  66 PR Interval:  195 QRS Duration: 91 QT Interval:  412 QTC Calculation: 432 R Axis:   159 Text Interpretation: Sinus rhythm Right axis deviation Low voltage, precordial leads Borderline T abnormalities, anterior leads No significant change since last tracing Confirmed by Lacretia Leigh ([id number]) on 11/20/2021 6:06:00 PM  Radiology No results found.  Procedures Procedures    Medications Ordered in ED Medications  lactated ringers infusion (has no administration in time range)  lactated ringers bolus 1,000 mL (has no administration in time range)    ED Course/ Medical Decision Making/ A&P                           Medical Decision Making  Patient noted to have guaiac positive stools here.  She has had a long history of having GI blood loss.  Hemoglobin here is 14.  She however is symptomatic.  She did have an EGD recently but did not have a colonoscopy.  Due to her being symptomatic and weak will consult hospitalist for admission        Final Clinical Impression(s) / ED Diagnoses Final diagnoses:  None    Rx / DC Orders ED Discharge Orders     None         Lacretia Leigh, MD 11/20/21 1926

## 2021-11-20 NOTE — H&P (Signed)
History and Physical    Molly Lucas [facility] ONG:[id number] DOB: [birth year] DOA: 11/20/2021  PCP: Donnajean Lopes, MD   Patient coming from: Home-Lives in a retirement home  Chief Complaint: Fatigue/weakness, dizziness  HPI: Molly Lucas is a 76 y.o. female with medical history significant for chronic GI bleed from AVMs, has hiatal hernia and cameron lesions, depression who presents with complaint of generalized fatigue and weakness with dizziness when she stands up to walk.  She reports she is been having to walk more carefully and has had decreased activities of daily living due to her symptoms.  She reports of the last 3 days she has had dark tarry stools.  She has had similar episodes in the past and has been admitted.  She was admitted at the end of December with similar symptoms and had upper endoscopy at that time which did not show active bleeding but did have some Cameron lesions and a hiatal hernia.  It was discussed with her that time she states about having a fundoplication and she is seeing her outpatient gastroenterologist since her discharge and is undergoing a work-up to see if she can qualify for fundoplication.  She has not had any falls or loss of consciousness.  Not seen any bright red blood in her stool.  She reports she has had nausea over the last week but has not had any vomiting.  She denies any fever or chills.  Present when she walks she is and was asked to stop and take a deep breath but has not had shortness of breath with laying flat or at rest.  She is followed by hematology as an outpatient and gets iron infusions.  She had iron labs a few days ago which were normal and had a normal B12 level at that time when I reviewed her lab's from her GI appointment at [facility] in care everywhere.  She reports she does not take oral iron and does not take Pepto-Bismol.  She does eat medium rare red meat.  She reports she is compliant with her Protonix Denies tobacco alcohol or  illicit drug use  ED Course: Patient been hemodynamically stable and lab work revealed a normal hemoglobin hematocrit.  Her hemoglobin is actually 1 g higher than it was when she was discharged.  She reports she feels to 1 weak and fatigued to safely go home and would like to stay overnight.  Lab work reveals WBC 5500 hemoglobin 14 hematocrit 42.9 platelets 188,000 sodium 139 potassium 4.0 chloride 109 bicarb 26 creatinine 0.71 BUN 18 alkaline phosphatase 77 AST 30 ALT 47 bilirubin 1.5.  COVID swab pending.  Hospitalist service asked to observe overnight for further management  Review of Systems:  General: Reports generalized fatigue/weakness, dizziness. Denies fever, chills, weight loss, night sweats. Denies change in appetite HENT: Denies head trauma, headache, denies change in hearing, tinnitus.  Denies nasal congestion or bleeding.  Denies sore throat.  Denies difficulty swallowing Eyes: Denies blurry vision, pain in eye, drainage.  Denies discoloration of eyes. Neck: Denies pain.  Denies swelling.  Denies pain with movement. Cardiovascular: Denies chest pain, palpitations.  Denies edema.  Denies orthopnea Respiratory: Denies shortness of breath, cough.  Denies wheezing.  Denies sputum production Gastrointestinal: Denies abdominal pain, swelling.  Denies vomiting, diarrhea. Denies hematemesis. Musculoskeletal: Denies limitation of movement.  Denies deformity or swelling.   Genitourinary: Denies pelvic pain.  Denies urinary frequency or hesitancy.  Denies dysuria.  Skin: Denies rash.  Denies petechiae, purpura, ecchymosis. Neurological:Denies syncope.  Denies seizure activity. Denies slurred speech, drooping face. Denies visual change. Psychiatric: Denies anxiety.  Denies hallucinations.  Past Medical History:  Diagnosis Date   Anemia    Takes iron supplement   Anxiety    Arthritis    Chronic pain of left thumb    Constipation    Deformity of left thumb joint    Z collapse deformity to  nondominant left thumb   Depression    Fibroid, uterine    GERD (gastroesophageal reflux disease)    History of blood transfusion    Hx of seasonal allergies    Hyperlipemia    Hypertension    hx of off medication now.   OA (osteoarthritis) of hip    left   Sleep apnea    dentist made mouthipiece     Past Surgical History:  Procedure Laterality Date   BIOPSY  03/30/2019   Procedure: BIOPSY;  Surgeon: Thornton Park, MD;  Location: [place];  Service: Gastroenterology;;   [place]  08/2015   with abductor pollicis longus tendon transfer and suspensionplasty   COLONOSCOPY WITH PROPOFOL N/A 03/31/2019   Procedure: COLONOSCOPY WITH PROPOFOL;  Surgeon: Thornton Park, MD;  Location: [place];  Service: Gastroenterology;  Laterality: N/A;   DE QUERVAIN'S RELEASE  08/2015   DILATATION & CURRETTAGE/HYSTEROSCOPY WITH RESECTOCOPE  2007   ESOPHAGOGASTRODUODENOSCOPY N/A 11/30/2015   Procedure: ESOPHAGOGASTRODUODENOSCOPY (EGD);  Surgeon: Richmond Campbell, MD;  Location[facility];  Service: Endoscopy;  Laterality: N/A;   ESOPHAGOGASTRODUODENOSCOPY (EGD) WITH PROPOFOL N/A 03/30/2019   Procedure: ESOPHAGOGASTRODUODENOSCOPY (EGD) WITH PROPOFOL;  Surgeon: Thornton Park, MD;  Location: [place];  Service: Gastroenterology;  Laterality: N/A;   ESOPHAGOGASTRODUODENOSCOPY (EGD) WITH PROPOFOL N/A 12/31/2020   Procedure: ESOPHAGOGASTRODUODENOSCOPY (EGD) WITH PROPOFOL;  Surgeon: Irene Shipper, MD;  Location: [facility];  Service: Endoscopy;  Laterality: N/A;   ESOPHAGOGASTRODUODENOSCOPY (EGD) WITH PROPOFOL N/A 11/04/2021   Procedure: ESOPHAGOGASTRODUODENOSCOPY (EGD) WITH PROPOFOL;  Surgeon: Ronnette Juniper, MD;  Location: [facility];  Service: Gastroenterology;  Laterality: N/A;   HERNIA REPAIR Left 06/2012   INGUINAL HERNIA REPAIR Right 05/03/2013   Procedure: HERNIA REPAIR INGUINAL ADULT;  Surgeon: Harl Bowie, MD;  Location: [place];  Service: General;   Laterality: Right;   INGUINAL HERNIA REPAIR Right 08/23/2013   Procedure: LAPAROSCOPIC INGUINAL HERNIA;  Surgeon: Harl Bowie, MD;  Location: [place];  Service: General;  Laterality: Right;   INSERTION OF MESH Right 05/03/2013   Procedure: INSERTION OF MESH;  Surgeon: Harl Bowie, MD;  Location: [place];  Service: General;  Laterality: Right;   INSERTION OF MESH Right 08/23/2013   Procedure: INSERTION OF MESH;  Surgeon: Harl Bowie, MD;  Location: [place];  Service: General;  Laterality: Right;   METACARPOPHALANGEAL JOINT ARTHRODESIS  08/2015   with local bone graft   TONSILLECTOMY     TOTAL HIP ARTHROPLASTY  06/24/2012   Procedure: TOTAL HIP ARTHROPLASTY ANTERIOR APPROACH;  Surgeon: Mcarthur Rossetti, MD;  Location: [facility];  Service: Orthopedics;  Laterality: Left;  Left total hip arthroplasty, anterior approach   UTERINE FIBROID SURGERY  2007   WISDOM TOOTH EXTRACTION      Social History  reports that she has never smoked. She has never used smokeless tobacco. She reports current alcohol use of about 2.0 standard drinks per week. She reports that she does not use drugs.  Allergies  Allergen Reactions   Nsaids     Patient suffers from sever GI bleeds.   Penicillins Swelling and  Other (See Comments)    Tongue became swollen and turned black Did it involve swelling of the face/tongue/throat, SOB, or low BP? Yes Did it involve sudden or severe rash/hives, skin peeling, or any reaction on the inside of your mouth or nose? Yes Did you need to seek medical attention at a hospital or doctor's office? Yes When did it last happen? "I was a child"   If all above answers are "NO", may proceed with cephalosporin use.    Other     Bug bites-per patient, any bug that she has been bitten by will cause her anxiety, dermatitis, hives, itching, nausea and swelling.    Family History  Problem Relation Age of Onset   Heart failure Mother    Osteoporosis Mother    Arthritis  Mother    CAD Maternal Grandfather    Heart failure Maternal Grandfather    Dementia Maternal Grandfather    CAD Paternal Grandmother    CAD Paternal Grandfather    Arthritis Sister      Prior to Admission medications   Medication Sig Start Date End Date Taking? Authorizing Provider  BIOTIN PO Take 1 tablet by mouth daily.    [provider]  Biotin w/ Vitamins C & E (HAIR SKIN & NAILS GUMMIES PO) Take 1 Piece by mouth daily.    [provider]  CALCIUM PO Take 1,000 mg by mouth daily.    [provider]  cetirizine (ZYRTEC) 10 MG tablet Take 10 mg by mouth in the morning and at bedtime.    [provider]  Cholecalciferol (VITAMIN D3) 250 MCG (10000 UT) TABS Take 10,000 Units by mouth daily.     [provider]  COLLAGEN PO Take 1 capsule by mouth daily.    [provider]  cyanocobalamin 1000 MCG tablet Take 1 tablet by mouth daily.    [provider]  NON FORMULARY Take 1 capsule by mouth See admin instructions. Black [place] oil capsules: Take 1 capsule by mouth once a day    [provider]  Omega-3 1000 MG CAPS Take 2,000 mg by mouth daily.     [provider]  pantoprazole (PROTONIX) 20 MG tablet 20 mg daily. 08/20/21   [provider]  pravastatin (PRAVACHOL) 20 MG tablet Take 20 mg by mouth at bedtime.     [provider]  Turmeric 500 MG CAPS Take 500 mg by mouth daily. Tumeric with cucumin    [provider]  VIIBRYD 40 MG TABS Take 40 mg by mouth daily. 12/24/20   [provider]  vitamin C (ASCORBIC ACID) 500 MG tablet Take 1,000 mg by mouth daily.    [provider]    Physical Exam: Vitals:   11/20/21 1653 11/20/21 1700 11/20/21 1800 11/20/21 1815  BP:  126/70 126/90 (!) 145/94  Pulse: 64 63 63 66  Resp:  18 18 18   Temp:      TempSrc:      SpO2: 99% 100% 99% 97%    Constitutional: NAD, calm, comfortable Vitals:   11/20/21 1653 11/20/21  1700 11/20/21 1800 11/20/21 1815  BP:  126/70 126/90 (!) 145/94  Pulse: 64 63 63 66  Resp:  18 18 18   Temp:      TempSrc:      SpO2: 99% 100% 99% 97%   General: WDWN, Alert and oriented x3.  Eyes: EOMI, PERRL, conjunctivae normal.  Sclera nonicteric HENT:  [place]/AT, external ears normal.  Nares patent without  epistasis.  Mucous membranes are moist. . Neck: Soft, normal range of motion, supple, no masses, Trachea midline Respiratory: clear to auscultation bilaterally, no wheezing, no crackles. Normal respiratory effort. No accessory muscle use.  Cardiovascular: Regular rate and rhythm, no murmurs / rubs / gallops. No extremity edema. 2+ pedal pulses. Abdomen: Soft, no tenderness, nondistended, no rebound or guarding.  No masses palpated. Bowel sounds normoactive Musculoskeletal: FROM. no cyanosis. No joint deformity upper and lower extremities. Normal muscle tone.  Skin: Warm, dry, intact no rashes, lesions, ulcers. No induration Neurologic: CN 2-12 grossly intact.  Normal speech.  Sensation intact to touch. Strength 5/5 in all extremities.   Psychiatric: Normal judgment and insight. Normal mood.    Labs on Admission: I have personally reviewed following labs and imaging studies  CBC: Recent Labs  Lab 11/20/21 1446  WBC 5.5  NEUTROABS 3.2  HGB 14.0  HCT 42.9  MCV 101.7*  PLT 856    Basic Metabolic Panel: Recent Labs  Lab 11/20/21 1446  NA 139  K 4.0  CL 109  CO2 26  GLUCOSE 87  BUN 18  CREATININE 0.71  CALCIUM 9.5    GFR: CrCl cannot be calculated (Unknown ideal weight.).  Liver Function Tests: Recent Labs  Lab 11/20/21 1446  AST 30  ALT 47*  ALKPHOS 77  BILITOT 1.5*  PROT 7.0  ALBUMIN 4.2    Urine analysis:    Component Value Date/Time   COLORURINE YELLOW 12/30/2020 [place] 12/30/2020 1835   LABSPEC 1.006 12/30/2020 1835   PHURINE 6.0 12/30/2020 1835   GLUCOSEU NEGATIVE 12/30/2020 [place] 12/30/2020 [place] 12/30/2020 [place] 12/30/2020 [place] 12/30/2020 1835   UROBILINOGEN 0.2 06/16/2012 1025   NITRITE NEGATIVE 12/30/2020 1835   LEUKOCYTESUR TRACE (A) 12/30/2020 1835    Radiological Exams on Admission: No results found.  EKG: Independently reviewed.  EKG shows normal sinus rhythm with no acute ST elevation or depression.  QTc 432  Assessment/Plan Principal Problem:   Weakness generalized Ms. Brereton is placed on Med/Surg for observation. She has complaint of generalized weakness and dizziness. She states she does not feel safe going home tonight.  She received IVF in the ER.  Check Orthostatic BP.   Active Problems:   Melena Pt with hemoccult positive stool and reports black tarry stools at home past 3 days. She has hx of GI bleed in past and has known AVMs in intestines. Has stable Hgb which is actually higher today than two weeks ago when discharged from hospital. She is followed by GI at [facility] as outpatient.  She eats medium rare red meat so this could cause positive hemoccult. She is not on oral iron and does not use peptal bismal she states.  She is on Viibryd for depression and it is cited in the literature to cause increase risk of GI bleeding so recommend that she discuss with her PCP about possible changing her therapy.    Hyperbilirubinemia Check Gallbladder u/s with elevated bilirubin and chronic nausea     Nausea Antiemetic as needed. Check Gallbladder u/s.    Dizziness Fall precautions. Check orthostatic BP    Hiatal hernia Chronic. Continue PPI bid dosing.  DVT prophylaxis: Padua score low and pt at risk of GI bleed so no anticoagulation. Early ambulation   Code Status:   Full Code  Family Communication:  Diagnosis and plan discussed with patient.  Patient verbalized  understanding agrees with plan.  Further recommendation follow as clinical indicated Disposition Plan:   Patient is  from:  Home  Anticipated DC to:  Home  Anticipated DC date:  Anticipate less than 2 midnight stay   Time spent:   60 minutes  Consults called:  Secure chat sent to [facility] to evaluate pt in am  Admission status:  Observation  Eben Burow MD [facility]  How to contact the [facility] Attending or Consulting provider [place] or covering provider during after hours [place], for this patient?   Check the care team in [facility] and look for a) attending/consulting [facility] provider listed and b) the [facility] team listed Log into [email or web address] and use [place]'s universal password to access. If you do not have the password, please contact the hospital operator. Locate the [facility] provider you are looking for under [facility] and page to a number that you can be directly reached. If you still have difficulty reaching the provider, please page the [facility] (Director on Call) for the Hospitalists listed on amion for assistance.  11/20/2021, 8:29 PM

## 2021-11-20 NOTE — ED Provider Triage Note (Signed)
Emergency Medicine Provider Triage Evaluation Note  Molly Lucas , a 76 y.o. female  was evaluated in triage.  Pt complains of GI bleed onset 2 days.  Patient has a history of similar concerns. She was evaluated by her GI specialist 2 days ago prior to the onset of her symptoms.  At that time she was informed to double the dose of her Protonix which she has been doing.  She has associated dizziness, nausea, and shortness of breath. Denies excessive NSAID use, alcohol use.  Denies chest pain, lightheadedness, abdominal pain, vomiting, melena.  Review of Systems  Positive: As per HPI above. Negative: Chest pain, abdominal pain  Physical Exam  BP (!) 149/98 (BP Location: Left Arm)    Pulse 77    Temp 98.5 F (36.9 C) (Oral)    Resp 16    SpO2 95%  Gen:   Awake, no distress   Resp:  Normal effort  MSK:   Moves extremities without difficulty  Other:  No abdominal tenderness to palpation  Medical Decision Making  Medically screening exam initiated at 1:30 PM.  Appropriate orders placed.  Molly Lucas [facility] was informed that the remainder of the evaluation will be completed by another provider, this initial triage assessment does not replace that evaluation, and the importance of remaining in the ED until their evaluation is complete.    Molly Lucas A, PA-C 11/20/21 1339

## 2021-11-20 NOTE — ED Triage Notes (Signed)
Pt her e with c/o chronic gi bleed dark stools , pt has hx of same and is on Protonix bid . Was just in hospital for same dec 26th

## 2021-11-21 ENCOUNTER — Encounter: Payer: Self-pay | Admitting: Hematology

## 2021-11-21 ENCOUNTER — Observation Stay (HOSPITAL_COMMUNITY): Payer: Medicare Other

## 2021-11-21 DIAGNOSIS — R531 Weakness: Secondary | ICD-10-CM | POA: Diagnosis not present

## 2021-11-21 LAB — CBC
HCT: 37.4 % (ref 36.0–46.0)
Hemoglobin: 12.3 g/dL (ref 12.0–15.0)
MCH: 32.9 pg (ref 26.0–34.0)
MCHC: 32.9 g/dL (ref 30.0–36.0)
MCV: 100 fL (ref 80.0–100.0)
Platelets: 163 10*3/uL (ref 150–400)
RBC: 3.74 MIL/uL — ABNORMAL LOW (ref 3.87–5.11)
RDW: 13.5 % (ref 11.5–15.5)
WBC: 4.5 10*3/uL (ref 4.0–10.5)
nRBC: 0 % (ref 0.0–0.2)

## 2021-11-21 LAB — COMPREHENSIVE METABOLIC PANEL
ALT: 41 U/L (ref 0–44)
AST: 27 U/L (ref 15–41)
Albumin: 3.6 g/dL (ref 3.5–5.0)
Alkaline Phosphatase: 65 U/L (ref 38–126)
Anion gap: 7 (ref 5–15)
BUN: 12 mg/dL (ref 8–23)
CO2: 26 mmol/L (ref 22–32)
Calcium: 9.2 mg/dL (ref 8.9–10.3)
Chloride: 107 mmol/L (ref 98–111)
Creatinine, Ser: 0.46 mg/dL (ref 0.44–1.00)
GFR, Estimated: >60 mL/min (ref >60)
Glucose, Bld: 97 mg/dL (ref 70–99)
Potassium: 3.9 mmol/L (ref 3.5–5.1)
Sodium: 140 mmol/L (ref 135–145)
Total Bilirubin: 1.6 mg/dL — ABNORMAL HIGH (ref 0.3–1.2)
Total Protein: 5.9 g/dL — ABNORMAL LOW (ref 6.5–8.1)

## 2021-11-21 LAB — HEMOGLOBIN AND HEMATOCRIT, BLOOD
HCT: 38.9 % (ref 36.0–46.0)
Hemoglobin: 12.8 g/dL (ref 12.0–15.0)

## 2021-11-21 LAB — TSH: TSH: 1.104 u[IU]/mL (ref 0.350–4.500)

## 2021-11-21 MED ORDER — PANTOPRAZOLE SODIUM 40 MG PO TBEC: 40.0000 mg | DELAYED_RELEASE_TABLET | Freq: Two times a day (BID) | ORAL | 0 refills | Status: DC

## 2021-11-21 NOTE — Assessment & Plan Note (Signed)
Some chronicity, follow outpatient RUQ [place] with unremarkable gallbladder wall and lumen

## 2021-11-21 NOTE — Progress Notes (Signed)
Patient was given discharge instructions, and all questions were answered.  Patient was stable for discharge and was taken to the main exit by wheelchair. 

## 2021-11-21 NOTE — Assessment & Plan Note (Signed)
Able to get up and ambulate without significant symptoms

## 2021-11-21 NOTE — Assessment & Plan Note (Signed)
As above, try gastroparesis type diet

## 2021-11-21 NOTE — Hospital Course (Signed)
Molly Lucas is Molly Lucas 76 y.o. female with medical history significant for chronic GI bleed from AVMs, has hiatal hernia and cameron lesions, depression who presents with complaint of generalized fatigue and weakness with dizziness when she stands up to walk.  She reports she is been having to walk more carefully and has had decreased activities of daily living due to her symptoms.  She reports of the last 3 days she has had dark tarry stools.  She has had similar episodes in the past and has been admitted.   She was admitted for generalized weakness, lightheadedness, and concern for melena.  Her hemoglobin was stable.  GI was consulted and did not recommend any inpatient interventions.  Her weakness and lightheadness improved, I suspect related to IVF.  She was discharged today with plan for outpatient follow up.    See below for additional details

## 2021-11-21 NOTE — Assessment & Plan Note (Signed)
Seems improved today Follow outpatient with PCP

## 2021-11-21 NOTE — Assessment & Plan Note (Signed)
Thought to be cause of nausea, intermittent melena, early satiety, recurrent anemia Recommended gastroparesis type diet given intrathoracic stomach

## 2021-11-21 NOTE — Consult Note (Signed)
Referring Provider: [facility] Primary Care Physician:  Donnajean Lopes, MD Primary Gastroenterologist:  Althia Forts  Reason for Consultation:  Melena  HPI: Molly Lucas is a 76 y.o. female medical history significant for chronic GI bleed from AVMs, has hiatal hernia and cameron lesions, depression who presents with melena.  Patient originally presented to ED with generalized fatigue and weakness with dizziness in addition to 3 days of dark tarry stools. She states she has a history of chronic GI bleed and she was told to come into the ED when she begins to feel bad. Hgb 14 on arrival with normal BUN. She was seen by primary GI 4 days ago. She is scheduled for surgical consult next week to discuss management of hiatal hernia. She states she has not had any stools since being in the ED. Feels nauseous, no vomiting. Gets regular iron infusions. Reports mild lower abdominal discomfort.   EGD done 11/04/2021 as inpatient for melena: tortuous esophagus, 10cm hiatal hernia with a few cameron ulcers  Last colonoscopy 03/2019 by Dr. Tarri Glenn done for heme positive stool:normal with no source of anemia identified.  Past Medical History:  Diagnosis Date   Anemia    Takes iron supplement   Anxiety    Arthritis    Chronic pain of left thumb    Constipation    Deformity of left thumb joint    Z collapse deformity to nondominant left thumb   Depression    Fibroid, uterine    GERD (gastroesophageal reflux disease)    History of blood transfusion    Hx of seasonal allergies    Hyperlipemia    Hypertension    hx of off medication now.   OA (osteoarthritis) of hip    left   Sleep apnea    dentist made mouthipiece     Past Surgical History:  Procedure Laterality Date   BIOPSY  03/30/2019   Procedure: BIOPSY;  Surgeon: Thornton Park, MD;  Location: [place];  Service: Gastroenterology;;   [place]  08/2015   with abductor pollicis longus tendon transfer and  suspensionplasty   COLONOSCOPY WITH PROPOFOL N/A 03/31/2019   Procedure: COLONOSCOPY WITH PROPOFOL;  Surgeon: Thornton Park, MD;  Location: [place];  Service: Gastroenterology;  Laterality: N/A;   DE QUERVAIN'S RELEASE  08/2015   DILATATION & CURRETTAGE/HYSTEROSCOPY WITH RESECTOCOPE  2007   ESOPHAGOGASTRODUODENOSCOPY N/A 11/30/2015   Procedure: ESOPHAGOGASTRODUODENOSCOPY (EGD);  Surgeon: Richmond Campbell, MD;  Location: [facility];  Service: Endoscopy;  Laterality: N/A;   ESOPHAGOGASTRODUODENOSCOPY (EGD) WITH PROPOFOL N/A 03/30/2019   Procedure: ESOPHAGOGASTRODUODENOSCOPY (EGD) WITH PROPOFOL;  Surgeon: Thornton Park, MD;  Location: [place];  Service: Gastroenterology;  Laterality: N/A;   ESOPHAGOGASTRODUODENOSCOPY (EGD) WITH PROPOFOL N/A 12/31/2020   Procedure: ESOPHAGOGASTRODUODENOSCOPY (EGD) WITH PROPOFOL;  Surgeon: Irene Shipper, MD;  Location: [facility];  Service: Endoscopy;  Laterality: N/A;   ESOPHAGOGASTRODUODENOSCOPY (EGD) WITH PROPOFOL N/A 11/04/2021   Procedure: ESOPHAGOGASTRODUODENOSCOPY (EGD) WITH PROPOFOL;  Surgeon: Ronnette Juniper, MD;  Location: [facility];  Service: Gastroenterology;  Laterality: N/A;   HERNIA REPAIR Left 06/2012   INGUINAL HERNIA REPAIR Right 05/03/2013   Procedure: HERNIA REPAIR INGUINAL ADULT;  Surgeon: Harl Bowie, MD;  Location: [place];  Service: General;  Laterality: Right;   INGUINAL HERNIA REPAIR Right 08/23/2013   Procedure: LAPAROSCOPIC INGUINAL HERNIA;  Surgeon: Harl Bowie, MD;  Location: [place];  Service: General;  Laterality: Right;   INSERTION OF MESH Right 05/03/2013   Procedure: INSERTION OF MESH;  Surgeon: Nathaneil Canary  Lynann Beaver, MD;  Location: [place];  Service: General;  Laterality: Right;   INSERTION OF MESH Right 08/23/2013   Procedure: INSERTION OF MESH;  Surgeon: Harl Bowie, MD;  Location: [place];  Service: General;  Laterality: Right;   METACARPOPHALANGEAL JOINT ARTHRODESIS  08/2015   with local bone graft    TONSILLECTOMY     TOTAL HIP ARTHROPLASTY  06/24/2012   Procedure: TOTAL HIP ARTHROPLASTY ANTERIOR APPROACH;  Surgeon: Mcarthur Rossetti, MD;  Location: [facility];  Service: Orthopedics;  Laterality: Left;  Left total hip arthroplasty, anterior approach   UTERINE FIBROID SURGERY  2007   WISDOM TOOTH EXTRACTION      Prior to Admission medications   Medication Sig Start Date End Date Taking? Authorizing Provider  BIOTIN PO Take 1 tablet by mouth daily.   Yes [provider]  Biotin w/ Vitamins C & E (HAIR SKIN & NAILS GUMMIES PO) Take 1 Piece by mouth daily.   Yes [provider]  CALCIUM PO Take 1,000 mg by mouth daily.   Yes [provider]  cetirizine (ZYRTEC) 10 MG tablet Take 10 mg by mouth in the morning and at bedtime.   Yes [provider]  Cholecalciferol (VITAMIN D3) 250 MCG (10000 UT) TABS Take 10,000 Units by mouth daily.    Yes [provider]  COLLAGEN PO Take 1 capsule by mouth daily.   Yes [provider]  cyanocobalamin 1000 MCG tablet Take 1,000 mcg by mouth daily.   Yes [provider]  NON FORMULARY Take 1 capsule by mouth See admin instructions. [place] oil capsules: Take 1 capsule by mouth once a day   Yes [provider]  Omega-3 1000 MG CAPS Take 2,000 mg by mouth daily.    Yes [provider]  pantoprazole (PROTONIX) 20 MG tablet 20 mg 2 (two) times daily. 08/20/21  Yes [provider]  Propylene Glycol (SYSTANE COMPLETE OP) Place 1 drop into both eyes 2 (two) times daily.   Yes [provider]  Turmeric 500 MG CAPS Take 500 mg by mouth daily. Tumeric with cucumin   Yes [provider]  VIIBRYD 40 MG TABS Take 40 mg by mouth daily. 12/24/20  Yes [provider]  vitamin C (ASCORBIC ACID) 500 MG tablet Take 1,000 mg by mouth daily.   Yes [provider]    Scheduled Meds:  loratadine  10 mg Oral Daily   pantoprazole  20 mg Oral BID    sodium chloride flush  3 mL Intravenous Q12H   Vilazodone HCl  40 mg Oral Daily   Continuous Infusions:  sodium chloride     lactated ringers 125 mL/hr at 11/21/21 0112   PRN Meds:.sodium chloride, acetaminophen **OR** acetaminophen, ondansetron **OR** ondansetron (ZOFRAN) IV, senna-docusate, sodium chloride flush  Allergies as of 11/20/2021 - Review Complete 11/20/2021  Allergen Reaction Noted   Nsaids  11/04/2021   Penicillins Swelling and Other (See Comments) 04/25/2012   Other  11/04/2021    Family History  Problem Relation Age of Onset   Heart failure Mother    Osteoporosis Mother    Arthritis Mother    CAD Maternal Grandfather    Heart failure Maternal Grandfather    Dementia Maternal Grandfather    CAD Paternal Grandmother    CAD Paternal Grandfather    Arthritis Sister     Social History   Socioeconomic History   Marital status: Divorced    Spouse name: Not on file  Number of children: Not on file   Years of education: Not on file   Highest education level: Not on file  Occupational History   Not on file  Tobacco Use   Smoking status: Never   Smokeless tobacco: Never  Vaping Use   Vaping Use: Never used  Substance and Sexual Activity   Alcohol use: Yes    Alcohol/week: 2.0 standard drinks    Types: 2 Glasses of wine per week    Comment: occasional glass of wine    Drug use: No   Sexual activity: Not on file  Other Topics Concern   Not on file  Social History Narrative   Not on file   Social Determinants of Health   Financial Resource Strain: Not on file  Food Insecurity: Not on file  Transportation Needs: Not on file  Physical Activity: Not on file  Stress: Not on file  Social Connections: Not on file  Intimate Partner Violence: Not on file    Review of Systems: Review of Systems  Constitutional:  Positive for malaise/fatigue. Negative for chills, fever and weight loss.  HENT:  Negative for hearing loss and tinnitus.   Eyes:  Negative for  blurred vision and double vision.  Respiratory:  Negative for cough, hemoptysis and sputum production.   Cardiovascular:  Negative for chest pain and palpitations.  Gastrointestinal:  Positive for abdominal pain, melena and nausea. Negative for blood in stool, constipation, diarrhea, heartburn and vomiting.  Genitourinary:  Negative for dysuria and urgency.  Musculoskeletal:  Negative for myalgias and neck pain.  Skin:  Negative for itching and rash.  Neurological:  Negative for seizures and loss of consciousness.  Psychiatric/Behavioral:  Negative for substance abuse. The patient is not nervous/anxious.     Physical Exam:Physical Exam Constitutional:      Appearance: Normal appearance.  HENT:     Head: Normocephalic and atraumatic.     Nose: Nose normal. No congestion.     Mouth/Throat:     Mouth: Mucous membranes are moist.     Pharynx: Oropharynx is clear.  Eyes:     Extraocular Movements: Extraocular movements intact.     Conjunctiva/sclera: Conjunctivae normal.  Cardiovascular:     Rate and Rhythm: Normal rate and regular rhythm.  Pulmonary:     Effort: Pulmonary effort is normal. No respiratory distress.  Abdominal:     General: Abdomen is flat. Bowel sounds are normal. There is no distension.     Palpations: Abdomen is soft. There is no mass.     Tenderness: There is no abdominal tenderness. There is no guarding or rebound.     Hernia: No hernia is present.  Musculoskeletal:        General: No swelling. Normal range of motion.     Cervical back: Normal range of motion. No rigidity.  Skin:    General: Skin is warm and dry.  Neurological:     General: No focal deficit present.     Mental Status: She is alert and oriented to person, place, and time.  Psychiatric:        Mood and Affect: Mood normal.        Behavior: Behavior normal.        Thought Content: Thought content normal.        Judgment: Judgment normal.    Vital signs: Vitals:   11/21/21 0151 11/21/21 0541   BP: 124/76 110/75  Pulse: 61 (!) 57  Resp: 16 12  Temp: 98.2 F (36.8 C)  98.2 F (36.8 C)  SpO2: 95% 96%   Last BM Date: 11/19/21    GI:  Lab Results: Recent Labs    11/20/21 1446 11/21/21 0412 11/21/21 0711  WBC 5.5 4.5  --   HGB 14.0 12.3 12.8  HCT 42.9 37.4 38.9  PLT 188 163  --    BMET Recent Labs    11/20/21 1446 11/21/21 0412  NA 139 140  K 4.0 3.9  CL 109 107  CO2 26 26  GLUCOSE 87 97  BUN 18 12  CREATININE 0.71 0.46  CALCIUM 9.5 9.2   LFT Recent Labs    11/21/21 0412  PROT 5.9*  ALBUMIN 3.6  AST 27  ALT 41  ALKPHOS 65  BILITOT 1.6*   PT/INR Recent Labs    11/20/21 1446  LABPROT 14.1  INR 1.1     Studies/Results: US Abdomen Limited RUQ (LIVER/GB)  Result Date: 11/21/2021 CLINICAL DATA:  Hyperbilirubinemia. EXAM: ULTRASOUND ABDOMEN LIMITED RIGHT UPPER QUADRANT COMPARISON:  CT with IV contrast 02/17/2018 FINDINGS: Gallbladder: No gallstones or wall thickening visualized. No sonographic Murphy sign noted by sonographer. Common bile duct: Diameter: 2.9 mm. Liver: No focal lesion identified. There is diffuse increased parenchymal echogenicity. Portal vein is patent on color Doppler imaging with normal direction of blood flow towards the liver. Other: There is no free ascites. IMPRESSION: 1. Diffuse increased hepatic echogenicity of steatosis. No focal lesion is seen through the steatosis and no biliary dilatation is evident. 2. Unremarkable gallbladder wall and lumen common no elicited positive right upper abdominal tenderness. 3. No portal vein dilatation or flow reversal. Electronically Signed   By: Telford Nab M.D.   On: 11/21/2021 05:55    Impression: Melena - hgb 12.8, MCV 100 - BUN 12, Cr. 0.46 - T. Bili 1.6 - RUQ [place]: diffuse increased hepatic echogenicity of steatosis. No biliary dilation. Unremarkable gallbladder.  Hiatal hernia  Plan: Stable hgb and normal BUN. Less likely this is an active GI bleed. No indication for procedures  at this time unless hgb worsens. Recommend conservative management Recommend continue with outpatient follow up [facility] will sign off. Please contact us if we can be of any further assistance during this hospital stay.     LOS: 0 days   Ena Demary Radford Pax  PA-C 11/21/2021, 7:55 AM  Contact #  [phone]

## 2021-11-21 NOTE — Assessment & Plan Note (Signed)
Upper endoscopy on 12/27 with 10 cm hiatal hernia with cameron ulcers At that time, hiatal hernia repair recommended as chronic blood loss thought 2/2 cameron lesions Her Hb is stable today and similar to 11/04/2022 lab value Appreciate GI recommendations -> recommending outpatient follow up, PPI, evaluation for hiatal hernia repair Discussed follow up with PCP/GI provider outpatient and repeat Hb within 1 week

## 2021-11-21 NOTE — Progress Notes (Signed)
°  Transition of Care [facility]) Screening Note   Patient Details  Name: Molly Lucas Date of Birth: 1946-04-25   Transition of Care [facility]) CM/SW Contact:    Lennart Pall, LCSW Phone Number: 11/21/2021, 2:18 PM    Transition of Care Department [facility]) has reviewed patient and no TOC needs have been identified at this time. We will continue to monitor patient advancement through interdisciplinary progression rounds. If new patient transition needs arise, please place a TOC consult.

## 2021-11-21 NOTE — Discharge Summary (Signed)
Physician Discharge Summary  Molly Lucas [facility] TKW:[id number] DOB: 15-Apr-1946 DOA: 11/20/2021  PCP: Donnajean Lopes, MD  Admit date: 11/20/2021 Discharge date: 11/21/2021  Time spent: 40 minutes  Recommendations for Outpatient Follow-up:  Follow outpatient CBC/CMP Follow hiatal hernia outpatient/cameron lesions outpatient Follow gastroenterology outpatient  Started on PPI 40 mg BID, follow with GI outpatient for dosing Follow mild hyperbili outpatient   ? Viibryd and bleeding risk - would follow outpatient to consider if this may contribute  Discharge Diagnoses:  Principal Problem:   Weakness generalized Active Problems:   Melena   Hiatal hernia   Nausea   Dizziness   Hyperbilirubinemia   Discharge Condition: stable  Diet recommendation: heart healthy  There were no vitals filed for this visit.  History of present illness:  Molly Lucas is Molly Lucas 76 y.o. female with medical history significant for chronic GI bleed from AVMs, has hiatal hernia and cameron lesions, depression who presents with complaint of generalized fatigue and weakness with dizziness when she stands up to walk.  She reports she is been having to walk more carefully and has had decreased activities of daily living due to her symptoms.  She reports of the last 3 days she has had dark tarry stools.  She has had similar episodes in the past and has been admitted.   She was admitted for generalized weakness, lightheadedness, and concern for melena.  Her hemoglobin was stable.  GI was consulted and did not recommend any inpatient interventions.  Her weakness and lightheadness improved, I suspect related to IVF.  She was discharged today with plan for outpatient follow up.    See below for additional details  Hospital Course:  * Weakness generalized Seems improved today Follow outpatient with PCP  Melena- (present on admission) Upper endoscopy on 12/27 with 10 cm hiatal hernia with cameron ulcers At that  time, hiatal hernia repair recommended as chronic blood loss thought 2/2 cameron lesions Her Hb is stable today and similar to 11/04/2022 lab value Appreciate GI recommendations -> recommending outpatient follow up, PPI, evaluation for hiatal hernia repair Discussed follow up with PCP/GI provider outpatient and repeat Hb within 1 week   Nausea As above, try gastroparesis type diet  Hiatal hernia Thought to be cause of nausea, intermittent melena, early satiety, recurrent anemia Recommended gastroparesis type diet given intrathoracic stomach  Dizziness Able to get up and ambulate without significant symptoms   Hyperbilirubinemia Some chronicity, follow outpatient RUQ [place] with unremarkable gallbladder wall and lumen    Procedures: none   Consultations: GI  Discharge Exam: Vitals:   11/21/21 1304 11/21/21 1333  BP: (!) 147/81 (!) 142/84  Pulse: 69 78  Resp: 12 17  Temp: 97.8 F (36.6 C) 98.7 F (37.1 C)  SpO2: 100% 99%   Feels better Lightheadedness improved  General: No acute distress. Cardiovascular: RRR Lungs: unlabored Abdomen: Soft, nontender, nondistended Neurological: Alert and oriented 3. Moves all extremities 4 . Cranial nerves II through XII grossly intact. Skin: Warm and dry. No rashes or lesions. Extremities: No clubbing or cyanosis. No edema.  Discharge Instructions   Discharge Instructions     Call MD for:  difficulty breathing, headache or visual disturbances   Complete by: As directed    Call MD for:  extreme fatigue   Complete by: As directed    Call MD for:  hives   Complete by: As directed    Call MD for:  persistant dizziness or light-headedness   Complete by: As directed  Call MD for:  persistant nausea and vomiting   Complete by: As directed    Call MD for:  redness, tenderness, or signs of infection (pain, swelling, redness, odor or green/yellow discharge around incision site)   Complete by: As directed    Call MD for:  severe  uncontrolled pain   Complete by: As directed    Call MD for:  temperature >100.4   Complete by: As directed    Diet - low sodium heart healthy   Complete by: As directed    Discharge instructions   Complete by: As directed    You were seen for generalized weakness, lightheadedness, and melena.  Your hemoglobin was stable and you were seen by gastroenterology who recommended follow up with your outpatient providers regarding treatment of your hiatal hernia.  I'll increase your acid blocker to 40 mg twice daily, follow up with your gastroenterologist outpatient for further instructions on dosing.   Dr. Paulita Fujita recommended Molly Lucas gastroparesis type diet for your nausea, I'll add information below regarding this.  Hopefully, this will help avoid some nausea.  Try to eat smaller and more frequent meals.  Try low fat foods.  Try low fiber forms of high fiber foods (cooked veggies instead of raw veggies).  I think your lightheadedness and weakness were likely due to dehydration.  You've improved now.    Return for new, recurrent, or worsening symptoms.  Please ask your PCP to request records from this hospitalization so they know what was done and what the next steps will be.   Increase activity slowly   Complete by: As directed       Allergies as of 11/21/2021       Reactions   Nsaids    Patient suffers from sever GI bleeds.   Penicillins Swelling, Other (See Comments)   Tongue became swollen and turned black Did it involve swelling of the face/tongue/throat, SOB, or low BP? Yes Did it involve sudden or severe rash/hives, skin peeling, or any reaction on the inside of your mouth or nose? Yes Did you need to seek medical attention at [facility] hospital or doctor's office? Yes When did it last happen? "I was Molly Lucas child"   If all above answers are "NO", may proceed with cephalosporin use.   Other    Bug bites-per patient, any bug that she has been bitten by will cause her anxiety, dermatitis, hives,  itching, nausea and swelling.        Medication List     TAKE these medications    BIOTIN PO Take 1 tablet by mouth daily.   CALCIUM PO Take 1,000 mg by mouth daily.   cetirizine 10 MG tablet Commonly known as: ZYRTEC Take 10 mg by mouth in the morning and at bedtime.   COLLAGEN PO Take 1 capsule by mouth daily.   cyanocobalamin 1000 MCG tablet Take 1,000 mcg by mouth daily.   HAIR SKIN & NAILS GUMMIES PO Take 1 Piece by mouth daily.   NON FORMULARY Take 1 capsule by mouth See admin instructions. Black [place] oil capsules: Take 1 capsule by mouth once Clarion Mooneyhan day   Omega-3 1000 MG Caps Take 2,000 mg by mouth daily.   pantoprazole 40 MG tablet Commonly known as: PROTONIX Take 1 tablet (40 mg total) by mouth 2 (two) times daily. What changed:  medication strength how much to take how to take this   SYSTANE COMPLETE OP Place 1 drop into both eyes 2 (two) times daily.   Turmeric  500 MG Caps Take 500 mg by mouth daily. Tumeric with cucumin   Viibryd 40 MG Tabs Generic drug: Vilazodone HCl Take 40 mg by mouth daily.   vitamin C 500 MG tablet Commonly known as: ASCORBIC ACID Take 1,000 mg by mouth daily.   Vitamin D3 250 MCG (10000 UT) Tabs Take 10,000 Units by mouth daily.       Allergies  Allergen Reactions   Nsaids     Patient suffers from sever GI bleeds.   Penicillins Swelling and Other (See Comments)    Tongue became swollen and turned black Did it involve swelling of the face/tongue/throat, SOB, or low BP? Yes Did it involve sudden or severe rash/hives, skin peeling, or any reaction on the inside of your mouth or nose? Yes Did you need to seek medical attention at [facility] hospital or doctor's office? Yes When did it last happen? "I was Molly Lucas child"   If all above answers are "NO", may proceed with cephalosporin use.    Other     Bug bites-per patient, any bug that she has been bitten by will cause her anxiety, dermatitis, hives, itching, nausea and  swelling.      The results of significant diagnostics from this hospitalization (including imaging, microbiology, ancillary and laboratory) are listed below for reference.    Significant Diagnostic Studies: XR HIP UNILAT W OR W/O PELVIS 1V LEFT  Result Date: 11/05/2021 An AP pelvis and lateral left hip shows Katalia Choma total hip arthroplasty with no complicating features.  US Abdomen Limited RUQ (LIVER/GB)  Result Date: 11/21/2021 CLINICAL DATA:  Hyperbilirubinemia. EXAM: ULTRASOUND ABDOMEN LIMITED RIGHT UPPER QUADRANT COMPARISON:  CT with IV contrast 02/17/2018 FINDINGS: Gallbladder: No gallstones or wall thickening visualized. No sonographic Murphy sign noted by sonographer. Common bile duct: Diameter: 2.9 mm. Liver: No focal lesion identified. There is diffuse increased parenchymal echogenicity. Portal vein is patent on color Doppler imaging with normal direction of blood flow towards the liver. Other: There is no free ascites. IMPRESSION: 1. Diffuse increased hepatic echogenicity of steatosis. No focal lesion is seen through the steatosis and no biliary dilatation is evident. 2. Unremarkable gallbladder wall and lumen common no elicited positive right upper abdominal tenderness. 3. No portal vein dilatation or flow reversal. Electronically Signed   By: Telford Nab M.D.   On: 11/21/2021 05:55    Microbiology: Recent Results (from the past 240 hour(s))  Resp Panel by RT-PCR (Flu Jeniya Flannigan&B, Covid) Nasopharyngeal Swab     Status: None   Collection Time: 11/20/21  7:21 PM   Specimen: Nasopharyngeal Swab; Nasopharyngeal(NP) swabs in vial transport medium  Result Value Ref Range Status   SARS Coronavirus 2 by RT PCR NEGATIVE NEGATIVE Final    Comment: (NOTE) SARS-CoV-2 target nucleic acids are NOT DETECTED.  The SARS-CoV-2 RNA is generally detectable in upper respiratory specimens during the acute phase of infection. The lowest concentration of SARS-CoV-2 viral copies this assay can detect is 138  copies/mL. Molly Lucas negative result does not preclude SARS-Cov-2 infection and should not be used as the sole basis for treatment or other patient management decisions. Molly Lucas negative result may occur with  improper specimen collection/handling, submission of specimen other than nasopharyngeal swab, presence of viral mutation(s) within the areas targeted by this assay, and inadequate number of viral copies(<138 copies/mL). Molly Lucas negative result must be combined with clinical observations, patient history, and epidemiological information. The expected result is Negative.  Fact Sheet for Patients:  EntrepreneurPulse.com.au  Fact Sheet for Healthcare Providers:  IncredibleEmployment.be  This test is no t yet approved or cleared by the [place] and  has been authorized for detection and/or diagnosis of SARS-CoV-2 by FDA under an Emergency Use Authorization (EUA). This EUA will remain  in effect (meaning this test can be used) for the duration of the COVID-19 declaration under Section 564(b)(1) of the Act, 21 U.S.C.section 360bbb-3(b)(1), unless the authorization is terminated  or revoked sooner.       Influenza Molly Lucas by PCR NEGATIVE NEGATIVE Final   Influenza B by PCR NEGATIVE NEGATIVE Final    Comment: (NOTE) The Xpert Xpress SARS-CoV-2/FLU/RSV plus assay is intended as an aid in the diagnosis of influenza from Nasopharyngeal swab specimens and should not be used as Molly Lucas sole basis for treatment. Nasal washings and aspirates are unacceptable for Xpert Xpress SARS-CoV-2/FLU/RSV testing.  Fact Sheet for Patients: EntrepreneurPulse.com.au  Fact Sheet for Healthcare Providers: IncredibleEmployment.be  This test is not yet approved or cleared by the [place] FDA and has been authorized for detection and/or diagnosis of SARS-CoV-2 by FDA under an Emergency Use Authorization (EUA). This EUA will remain in effect (meaning  this test can be used) for the duration of the COVID-19 declaration under Section 564(b)(1) of the Act, 21 U.S.C. section 360bbb-3(b)(1), unless the authorization is terminated or revoked.  Performed at [facility], [place] [address], [place], [place] [address]      Labs: Basic Metabolic Panel: Recent Labs  Lab 11/20/21 1446 11/21/21 0412  NA 139 140  K 4.0 3.9  CL 109 107  CO2 26 26  GLUCOSE 87 97  BUN 18 12  CREATININE 0.71 0.46  CALCIUM 9.5 9.2   Liver Function Tests: Recent Labs  Lab 11/20/21 1446 11/21/21 0412  AST 30 27  ALT 47* 41  ALKPHOS 77 65  BILITOT 1.5* 1.6*  PROT 7.0 5.9*  ALBUMIN 4.2 3.6   No results for input(s): LIPASE, AMYLASE in the last 168 hours. No results for input(s): AMMONIA in the last 168 hours. CBC: Recent Labs  Lab 11/20/21 1446 11/21/21 0412 11/21/21 0711  WBC 5.5 4.5  --   NEUTROABS 3.2  --   --   HGB 14.0 12.3 12.8  HCT 42.9 37.4 38.9  MCV 101.7* 100.0  --   PLT 188 163  --    Cardiac Enzymes: No results for input(s): CKTOTAL, CKMB, CKMBINDEX, TROPONINI in the last 168 hours. BNP: BNP (last 3 results) No results for input(s): BNP in the last 8760 hours.  ProBNP (last 3 results) No results for input(s): PROBNP in the last 8760 hours.  CBG: No results for input(s): GLUCAP in the last 168 hours.     Signed:  Fayrene Helper MD.  [facility] 11/21/2021, 4:44 PM

## 2021-11-25 ENCOUNTER — Encounter: Payer: Self-pay | Admitting: Hematology

## 2021-11-26 ENCOUNTER — Other Ambulatory Visit: Payer: Self-pay

## 2021-11-26 ENCOUNTER — Ambulatory Visit
Admit: 2021-11-26 | Discharge: 2021-11-26 | Disposition: A | Discharge status: Home / Self Care | Payer: Medicare Other | Attending: Internal Medicine | Admitting: Internal Medicine

## 2021-11-26 DIAGNOSIS — K219 Gastro-esophageal reflux disease without esophagitis: Secondary | ICD-10-CM

## 2021-11-26 DIAGNOSIS — K449 Diaphragmatic hernia without obstruction or gangrene: Secondary | ICD-10-CM

## 2022-01-12 ENCOUNTER — Inpatient Hospital Stay: Payer: Medicare Other | Attending: Hematology

## 2022-01-12 ENCOUNTER — Other Ambulatory Visit: Payer: Self-pay

## 2022-01-12 ENCOUNTER — Inpatient Hospital Stay: Payer: Medicare Other

## 2022-01-12 VITALS — BP 124/69 | HR 85 | Temp 98.0°F | Resp 18

## 2022-01-12 DIAGNOSIS — D500 Iron deficiency anemia secondary to blood loss (chronic): Secondary | ICD-10-CM

## 2022-01-12 LAB — IRON AND IRON BINDING CAPACITY (CC-WL,HP ONLY)
Iron: 117 ug/dL (ref 28–170)
Saturation Ratios: 35 % — ABNORMAL HIGH (ref 10.4–31.8)
TIBC: 335 ug/dL (ref 250–450)
UIBC: 218 ug/dL (ref 148–442)

## 2022-01-12 LAB — CMP (CANCER CENTER ONLY)
ALT: 42 U/L (ref 0–44)
AST: 27 U/L (ref 15–41)
Albumin: 4.4 g/dL (ref 3.5–5.0)
Alkaline Phosphatase: 85 U/L (ref 38–126)
Anion gap: 6 (ref 5–15)
BUN: 22 mg/dL (ref 8–23)
CO2: 26 mmol/L (ref 22–32)
Calcium: 11.4 mg/dL — ABNORMAL HIGH (ref 8.9–10.3)
Chloride: 108 mmol/L (ref 98–111)
Creatinine: 0.85 mg/dL (ref 0.44–1.00)
GFR, Estimated: >60 mL/min (ref >60)
Glucose, Bld: 140 mg/dL — ABNORMAL HIGH (ref 70–99)
Potassium: 3.8 mmol/L (ref 3.5–5.1)
Sodium: 140 mmol/L (ref 135–145)
Total Bilirubin: 1.2 mg/dL (ref 0.3–1.2)
Total Protein: 7.2 g/dL (ref 6.5–8.1)

## 2022-01-12 LAB — CBC WITH DIFFERENTIAL (CANCER CENTER ONLY)
Abs Immature Granulocytes: 0.02 10*3/uL (ref 0.00–0.07)
Basophils Absolute: 0 10*3/uL (ref 0.0–0.1)
Basophils Relative: 0 %
Eosinophils Absolute: 0.2 10*3/uL (ref 0.0–0.5)
Eosinophils Relative: 3 %
HCT: 46.2 % — ABNORMAL HIGH (ref 36.0–46.0)
Hemoglobin: 15.3 g/dL — ABNORMAL HIGH (ref 12.0–15.0)
Immature Granulocytes: 0 %
Lymphocytes Relative: 25 %
Lymphs Abs: 1.7 10*3/uL (ref 0.7–4.0)
MCH: 32.3 pg (ref 26.0–34.0)
MCHC: 33.1 g/dL (ref 30.0–36.0)
MCV: 97.5 fL (ref 80.0–100.0)
Monocytes Absolute: 0.6 10*3/uL (ref 0.1–1.0)
Monocytes Relative: 9 %
Neutro Abs: 4.3 10*3/uL (ref 1.7–7.7)
Neutrophils Relative %: 63 %
Platelet Count: 207 10*3/uL (ref 150–400)
RBC: 4.74 MIL/uL (ref 3.87–5.11)
RDW: 12.9 % (ref 11.5–15.5)
WBC Count: 6.8 10*3/uL (ref 4.0–10.5)
nRBC: 0 % (ref 0.0–0.2)

## 2022-01-12 MED ORDER — SODIUM CHLORIDE 0.9 % IV SOLN: INTRAVENOUS | Status: DC

## 2022-01-12 MED ORDER — SODIUM CHLORIDE 0.9 % IV SOLN
750.0000 mg | Freq: Once | INTRAVENOUS | Status: AC
  Administered 2022-01-12: 750 mg via INTRAVENOUS
  Filled 2022-01-12: qty 15

## 2022-01-12 NOTE — Patient Instructions (Signed)
Ferric Carboxymaltose Injection °What is this medication? °FERRIC CARBOXYMALTOSE (FER ik kar BOX ee MAWL tose) treats low levels of iron in your body (iron deficiency anemia). Iron is a mineral that plays an important role in making red blood cells, which carry oxygen from your lungs to the rest of your body. °This medicine may be used for other purposes; ask your health care provider or pharmacist if you have questions. °COMMON BRAND NAME(S): Injectafer °What should I tell my care team before I take this medication? °They need to know if you have any of these conditions: °High blood pressure °High levels of iron in the blood °An unusual or allergic reaction to iron, other medications, foods, dyes, or preservatives °Pregnant or trying to get pregnant °Breast-feeding °How should I use this medication? °This medication is injected into a vein. It is given by your care team in a hospital or clinic setting. °Talk to your care team about the use of this medication in children. While it may be given to children as young as 1 year for selected conditions, precautions do apply. °Overdosage: If you think you have taken too much of this medicine contact a poison control center or emergency room at once. °NOTE: This medicine is only for you. Do not share this medicine with others. °What if I miss a dose? °Keep appointments for follow-up doses. It is important not to miss your dose. Call your care team if you are unable to keep an appointment. °What may interact with this medication? °Do not take this medication with any of the following medications: °Deferoxamine °Dimercaprol °Other iron products °This list may not describe all possible interactions. Give your health care provider a list of all the medicines, herbs, non-prescription drugs, or dietary supplements you use. Also tell them if you smoke, drink alcohol, or use illegal drugs. Some items may interact with your medicine. °What should I watch for while using this  medication? °Visit your care team for regular checks on your progress. Tell your care team if your symptoms do not start to get better or if they get worse. °You may need blood work while you are taking this medication. °You may need to eat more foods that contain iron. Talk to your care team. Foods that contain iron include whole grains/cereals, dried fruits, beans, or peas, leafy green vegetables, and organ meats (liver, kidney). °What side effects may I notice from receiving this medication? °Side effects that you should report to your care team as soon as possible: °Allergic reactions--skin rash, itching, hives, swelling of the face, lips, tongue, or throat °Increase in blood pressure °Low blood pressure--dizziness, feeling faint or lightheaded, blurry vision °Shortness of breath °Side effects that usually do not require medical attention (report to your care team if they continue or are bothersome): °Flushing °Headache °Joint pain °Muscle pain °Nausea °Pain, redness, or irritation at injection site °This list may not describe all possible side effects. Call your doctor for medical advice about side effects. You may report side effects to FDA at 1-800-FDA-1088. °Where should I keep my medication? °This medication is given in a hospital or clinic. It will not be stored at home. °NOTE: This sheet is a summary. It may not cover all possible information. If you have questions about this medicine, talk to your doctor, pharmacist, or health care provider. °© 2022 [employer] (2021-03-21 00:00:00) ° °

## 2022-01-12 NOTE — Progress Notes (Signed)
BP 124/69 (BP Location: Right Arm, Patient Position: Sitting)   Pulse 85   Temp 98 ?F (36.7 ?C) (Oral)   Resp 18   SpO2 98%  ? ?Patient tolerated iron previously and did well, again, today. Post administration vitals were stable and consistent with previous vitals. No pain or discomfort. IV removed with tip intact. ?

## 2022-01-13 LAB — FERRITIN: Ferritin: 569 ng/mL — ABNORMAL HIGH (ref 11–307)

## 2022-04-07 ENCOUNTER — Telehealth: Payer: Self-pay | Admitting: Orthopaedic Surgery

## 2022-04-07 ENCOUNTER — Telehealth: Payer: Self-pay | Admitting: Hematology

## 2022-04-07 NOTE — Telephone Encounter (Signed)
Left message with rescheduled upcoming appointment due to provider's template. 

## 2022-04-08 ENCOUNTER — Ambulatory Visit: Payer: Medicare Other | Admitting: Orthopaedic Surgery

## 2022-04-14 ENCOUNTER — Inpatient Hospital Stay (HOSPITAL_BASED_OUTPATIENT_CLINIC_OR_DEPARTMENT_OTHER): Payer: Medicare Other | Admitting: Hematology

## 2022-04-14 ENCOUNTER — Telehealth: Payer: Self-pay | Admitting: Hematology

## 2022-04-14 ENCOUNTER — Inpatient Hospital Stay: Payer: Medicare Other

## 2022-04-14 ENCOUNTER — Inpatient Hospital Stay: Payer: Medicare Other | Attending: Hematology

## 2022-04-14 ENCOUNTER — Other Ambulatory Visit: Payer: Medicare Other

## 2022-04-14 ENCOUNTER — Ambulatory Visit: Payer: Medicare Other | Admitting: Hematology

## 2022-04-14 ENCOUNTER — Other Ambulatory Visit: Payer: Self-pay

## 2022-04-14 VITALS — BP 137/89 | HR 66 | Temp 98.1°F | Resp 18

## 2022-04-14 DIAGNOSIS — D500 Iron deficiency anemia secondary to blood loss (chronic): Secondary | ICD-10-CM | POA: Insufficient documentation

## 2022-04-14 LAB — CMP (CANCER CENTER ONLY)
ALT: 29 U/L (ref 0–44)
AST: 21 U/L (ref 15–41)
Albumin: 4.3 g/dL (ref 3.5–5.0)
Alkaline Phosphatase: 89 U/L (ref 38–126)
Anion gap: 3 — ABNORMAL LOW (ref 5–15)
BUN: 14 mg/dL (ref 8–23)
CO2: 32 mmol/L (ref 22–32)
Calcium: 10.8 mg/dL — ABNORMAL HIGH (ref 8.9–10.3)
Chloride: 106 mmol/L (ref 98–111)
Creatinine: 0.7 mg/dL (ref 0.44–1.00)
GFR, Estimated: >60 mL/min (ref >60)
Glucose, Bld: 107 mg/dL — ABNORMAL HIGH (ref 70–99)
Potassium: 3.6 mmol/L (ref 3.5–5.1)
Sodium: 141 mmol/L (ref 135–145)
Total Bilirubin: 1.2 mg/dL (ref 0.3–1.2)
Total Protein: 6.9 g/dL (ref 6.5–8.1)

## 2022-04-14 LAB — CBC WITH DIFFERENTIAL (CANCER CENTER ONLY)
Abs Immature Granulocytes: 0.03 10*3/uL (ref 0.00–0.07)
Basophils Absolute: 0 10*3/uL (ref 0.0–0.1)
Basophils Relative: 1 %
Eosinophils Absolute: 0.3 10*3/uL (ref 0.0–0.5)
Eosinophils Relative: 4 %
HCT: 43.5 % (ref 36.0–46.0)
Hemoglobin: 14.8 g/dL (ref 12.0–15.0)
Immature Granulocytes: 1 %
Lymphocytes Relative: 24 %
Lymphs Abs: 1.5 10*3/uL (ref 0.7–4.0)
MCH: 33.5 pg (ref 26.0–34.0)
MCHC: 34 g/dL (ref 30.0–36.0)
MCV: 98.4 fL (ref 80.0–100.0)
Monocytes Absolute: 0.5 10*3/uL (ref 0.1–1.0)
Monocytes Relative: 7 %
Neutro Abs: 4 10*3/uL (ref 1.7–7.7)
Neutrophils Relative %: 63 %
Platelet Count: 207 10*3/uL (ref 150–400)
RBC: 4.42 MIL/uL (ref 3.87–5.11)
RDW: 13.3 % (ref 11.5–15.5)
WBC Count: 6.3 10*3/uL (ref 4.0–10.5)
nRBC: 0 % (ref 0.0–0.2)

## 2022-04-14 LAB — IRON AND IRON BINDING CAPACITY (CC-WL,HP ONLY)
Iron: 130 ug/dL (ref 28–170)
Saturation Ratios: 39 % — ABNORMAL HIGH (ref 10.4–31.8)
TIBC: 333 ug/dL (ref 250–450)
UIBC: 203 ug/dL (ref 148–442)

## 2022-04-14 MED ORDER — SODIUM CHLORIDE 0.9 % IV SOLN: Freq: Once | INTRAVENOUS | Status: AC

## 2022-04-14 MED ORDER — SODIUM CHLORIDE 0.9 % IV SOLN
750.0000 mg | Freq: Once | INTRAVENOUS | Status: AC
  Administered 2022-04-14: 750 mg via INTRAVENOUS
  Filled 2022-04-14: qty 15

## 2022-04-14 NOTE — Telephone Encounter (Signed)
Left message with rescheduled upcoming appointment per 6/6 los.

## 2022-04-14 NOTE — Progress Notes (Signed)
Patient stayed for 15 minutes post infusion - declined 30 minute post-observation period.  VSS.  No s/s or c/o distress or discomfort.

## 2022-04-14 NOTE — Patient Instructions (Signed)
Ferric Carboxymaltose Injection What is this medication? FERRIC CARBOXYMALTOSE (FER ik kar BOX ee MAWL tose) treats low levels of iron in your body (iron deficiency anemia). Iron is a mineral that plays an important role in making red blood cells, which carry oxygen from your lungs to the rest of your body. This medicine may be used for other purposes; ask your health care provider or pharmacist if you have questions. COMMON BRAND NAME(S): Injectafer What should I tell my care team before I take this medication? They need to know if you have any of these conditions: High blood pressure High levels of iron in the blood An unusual or allergic reaction to iron, other medications, foods, dyes, or preservatives Pregnant or trying to get pregnant Breast-feeding How should I use this medication? This medication is injected into a vein. It is given by your care team in a hospital or clinic setting. Talk to your care team about the use of this medication in children. While it may be given to children as young as 1 year for selected conditions, precautions do apply. Overdosage: If you think you have taken too much of this medicine contact a poison control center or emergency room at once. NOTE: This medicine is only for you. Do not share this medicine with others. What if I miss a dose? Keep appointments for follow-up doses. It is important not to miss your dose. Call your care team if you are unable to keep an appointment. What may interact with this medication? Do not take this medication with any of the following medications: Deferoxamine Dimercaprol Other iron products This list may not describe all possible interactions. Give your health care provider a list of all the medicines, herbs, non-prescription drugs, or dietary supplements you use. Also tell them if you smoke, drink alcohol, or use illegal drugs. Some items may interact with your medicine. What should I watch for while using this  medication? Visit your care team for regular checks on your progress. Tell your care team if your symptoms do not start to get better or if they get worse. You may need blood work while you are taking this medication. You may need to eat more foods that contain iron. Talk to your care team. Foods that contain iron include whole grains/cereals, dried fruits, beans, or peas, leafy green vegetables, and organ meats (liver, kidney). What side effects may I notice from receiving this medication? Side effects that you should report to your care team as soon as possible: Allergic reactions--skin rash, itching, hives, swelling of the face, lips, tongue, or throat Increase in blood pressure Low blood pressure--dizziness, feeling faint or lightheaded, blurry vision Shortness of breath Side effects that usually do not require medical attention (report to your care team if they continue or are bothersome): Flushing Headache Joint pain Muscle pain Nausea Pain, redness, or irritation at injection site This list may not describe all possible side effects. Call your doctor for medical advice about side effects. You may report side effects to FDA at 1-800-FDA-1088. Where should I keep my medication? This medication is given in a hospital or clinic. It will not be stored at home. NOTE: This sheet is a summary. It may not cover all possible information. If you have questions about this medicine, talk to your doctor, pharmacist, or health care provider.  2023 Elsevier/Gold Standard (2021-03-21 00:00:00)  

## 2022-04-15 LAB — FERRITIN: Ferritin: 442 ng/mL — ABNORMAL HIGH (ref 11–307)

## 2022-04-19 ENCOUNTER — Encounter: Payer: Self-pay | Admitting: Hematology

## 2022-04-19 NOTE — Progress Notes (Signed)
HEMATOLOGY/ONCOLOGY CLINIC NOTE  Date of Service: .04/14/2022   Patient Care Team: Donnajean Lopes, MD as PCP - General (Internal Medicine)  CHIEF COMPLAINTS/PURPOSE OF CONSULTATION:  Follow-up for continued valuation management of the patient's anemia   HISTORY OF PRESENTING ILLNESS:  Please see previous notes for details on initial presentation.  INTERVAL HISTORY:  Molly Lucas is a 76 y.o. female is here for clinic evaluation and management of iron deficiency anemia.  She is tolerating her IV iron infusions without any issues.  No significant fatigue or dizziness at this time. No significant anemia at this time. Still notes intermittent black stools especially over the last couple of weeks. She notes that she will connect with her gastroenterologist about further evaluation and management of this. She prefers to continue a proactive treatment approach to keep from needing blood transfusions. Labs today were reviewed with the patient in detail.   MEDICAL HISTORY:  Past Medical History:  Diagnosis Date   Anemia    Takes iron supplement   Anxiety    Arthritis    Chronic pain of left thumb    Constipation    Deformity of left thumb joint    Z collapse deformity to nondominant left thumb   Depression    Fibroid, uterine    GERD (gastroesophageal reflux disease)    History of blood transfusion    Hx of seasonal allergies    Hyperlipemia    Hypertension    hx of off medication now.   OA (osteoarthritis) of hip    left   Sleep apnea    dentist made mouthipiece     SURGICAL HISTORY: Past Surgical History:  Procedure Laterality Date   BIOPSY  03/30/2019   Procedure: BIOPSY;  Surgeon: Thornton Park, MD;  Location: [facility];  Service: Gastroenterology;;   [place]  08/2015   with abductor pollicis longus tendon transfer and suspensionplasty   COLONOSCOPY WITH PROPOFOL N/A 03/31/2019   Procedure: COLONOSCOPY WITH PROPOFOL;   Surgeon: Thornton Park, MD;  Location: [facility];  Service: Gastroenterology;  Laterality: N/A;   DE QUERVAIN'S RELEASE  08/2015   DILATATION & CURRETTAGE/HYSTEROSCOPY WITH RESECTOCOPE  2007   ESOPHAGOGASTRODUODENOSCOPY N/A 11/30/2015   Procedure: ESOPHAGOGASTRODUODENOSCOPY (EGD);  Surgeon: Richmond Campbell, MD;  Location: [facility];  Service: Endoscopy;  Laterality: N/A;   ESOPHAGOGASTRODUODENOSCOPY (EGD) WITH PROPOFOL N/A 03/30/2019   Procedure: ESOPHAGOGASTRODUODENOSCOPY (EGD) WITH PROPOFOL;  Surgeon: Thornton Park, MD;  Location: [facility];  Service: Gastroenterology;  Laterality: N/A;   ESOPHAGOGASTRODUODENOSCOPY (EGD) WITH PROPOFOL N/A 12/31/2020   Procedure: ESOPHAGOGASTRODUODENOSCOPY (EGD) WITH PROPOFOL;  Surgeon: Irene Shipper, MD;  Location: [facility];  Service: Endoscopy;  Laterality: N/A;   ESOPHAGOGASTRODUODENOSCOPY (EGD) WITH PROPOFOL N/A 11/04/2021   Procedure: ESOPHAGOGASTRODUODENOSCOPY (EGD) WITH PROPOFOL;  Surgeon: Ronnette Juniper, MD;  Location: [facility];  Service: Gastroenterology;  Laterality: N/A;   HERNIA REPAIR Left 06/2012   INGUINAL HERNIA REPAIR Right 05/03/2013   Procedure: HERNIA REPAIR INGUINAL ADULT;  Surgeon: Harl Bowie, MD;  Location: [facility];  Service: General;  Laterality: Right;   INGUINAL HERNIA REPAIR Right 08/23/2013   Procedure: LAPAROSCOPIC INGUINAL HERNIA;  Surgeon: Harl Bowie, MD;  Location: [facility];  Service: General;  Laterality: Right;   INSERTION OF MESH Right 05/03/2013   Procedure: INSERTION OF MESH;  Surgeon: Harl Bowie, MD;  Location: [facility];  Service: General;  Laterality: Right;   INSERTION OF MESH Right 08/23/2013   Procedure: INSERTION OF MESH;  Surgeon: Nathaneil Canary  Lynann Beaver, MD;  Location: [facility];  Service: General;  Laterality: Right;   METACARPOPHALANGEAL JOINT ARTHRODESIS  08/2015   with local bone graft   TONSILLECTOMY     TOTAL HIP ARTHROPLASTY  06/24/2012   Procedure: TOTAL HIP ARTHROPLASTY ANTERIOR  APPROACH;  Surgeon: Mcarthur Rossetti, MD;  Location: [facility];  Service: Orthopedics;  Laterality: Left;  Left total hip arthroplasty, anterior approach   UTERINE FIBROID SURGERY  2007   WISDOM TOOTH EXTRACTION      SOCIAL HISTORY: Social History   Socioeconomic History   Marital status: Divorced    Spouse name: Not on file   Number of children: Not on file   Years of education: Not on file   Highest education level: Not on file  Occupational History   Not on file  Tobacco Use   Smoking status: Never   Smokeless tobacco: Never  Vaping Use   Vaping Use: Never used  Substance and Sexual Activity   Alcohol use: Yes    Alcohol/week: 2.0 standard drinks of alcohol    Types: 2 Glasses of wine per week    Comment: occasional glass of wine    Drug use: No   Sexual activity: Not on file  Other Topics Concern   Not on file  Social History Narrative   Not on file   Social Determinants of Health   Financial Resource Strain: Not on file  Food Insecurity: Not on file  Transportation Needs: Not on file  Physical Activity: Not on file  Stress: Not on file  Social Connections: Not on file  Intimate Partner Violence: Not on file    FAMILY HISTORY: Family History  Problem Relation Age of Onset   Heart failure Mother    Osteoporosis Mother    Arthritis Mother    CAD Maternal Grandfather    Heart failure Maternal Grandfather    Dementia Maternal Grandfather    CAD Paternal Grandmother    CAD Paternal Grandfather    Arthritis Sister     ALLERGIES:  is allergic to nsaids, penicillins, and other.  MEDICATIONS:  Current Outpatient Medications  Medication Sig Dispense Refill   BIOTIN PO Take 1 tablet by mouth daily.     Biotin w/ Vitamins C & E (HAIR SKIN & NAILS GUMMIES PO) Take 1 Piece by mouth daily.     CALCIUM PO Take 1,000 mg by mouth daily.     cetirizine (ZYRTEC) 10 MG tablet Take 10 mg by mouth in the morning and at bedtime.     Cholecalciferol (VITAMIN D3) 250  MCG (10000 UT) TABS Take 10,000 Units by mouth daily.      COLLAGEN PO Take 1 capsule by mouth daily.     cyanocobalamin 1000 MCG tablet Take 1,000 mcg by mouth daily.     NON FORMULARY Take 1 capsule by mouth See admin instructions. Black [place] oil capsules: Take 1 capsule by mouth once a day     Omega-3 1000 MG CAPS Take 2,000 mg by mouth daily.      pantoprazole (PROTONIX) 40 MG tablet Take 1 tablet (40 mg total) by mouth 2 (two) times daily. 60 tablet 0   Propylene Glycol (SYSTANE COMPLETE OP) Place 1 drop into both eyes 2 (two) times daily.     Turmeric 500 MG CAPS Take 500 mg by mouth daily. Tumeric with cucumin     VIIBRYD 40 MG TABS Take 40 mg by mouth daily.     vitamin C (ASCORBIC ACID) 500  MG tablet Take 1,000 mg by mouth daily.     No current facility-administered medications for this visit.    REVIEW OF SYSTEMS: .10 Point review of Systems was done is negative except as noted above.  PHYSICAL EXAMINATION: ECOG FS:2 - Symptomatic, <50% confined to bed  VS reviewed   Wt Readings from Last 3 Encounters:  11/04/21 201 lb 15.1 oz (91.6 kg)  10/14/21 202 lb 9.6 oz (91.9 kg)  05/14/21 205 lb 8 oz (93.2 kg)   There is no height or weight on file to calculate BMI.   Marland Kitchen GENERAL:alert, in no acute distress and comfortable SKIN: no acute rashes, no significant lesions EYES: conjunctiva are pink and non-injected, sclera anicteric OROPHARYNX: MMM, no exudates, no oropharyngeal erythema or ulceration NECK: supple, no JVD LYMPH:  no palpable lymphadenopathy in the cervical, axillary or inguinal regions LUNGS: clear to auscultation b/l with normal respiratory effort HEART: regular rate & rhythm ABDOMEN:  normoactive bowel sounds , non tender, not distended. Extremity: no pedal edema PSYCH: alert & oriented x 3 with fluent speech NEURO: no focal motor/sensory deficits   LABORATORY DATA:  I have reviewed the data as listed  .    Latest Ref Rng & Units 04/14/2022    2:50 PM  01/12/2022    2:46 PM 11/21/2021    7:11 AM  CBC  WBC 4.0 - 10.5 K/uL 6.3  6.8    Hemoglobin 12.0 - 15.0 g/dL 14.8  15.3  12.8   Hematocrit 36.0 - 46.0 % 43.5  46.2  38.9   Platelets 150 - 400 K/uL 207  207      .    Latest Ref Rng & Units 04/14/2022    2:50 PM 01/12/2022    2:46 PM 11/21/2021    4:12 AM  CMP  Glucose 70 - 99 mg/dL 107  140  97   BUN 8 - 23 mg/dL '14  22  12   '$ Creatinine 0.44 - 1.00 mg/dL 0.70  0.85  0.46   Sodium 135 - 145 mmol/L 141  140  140   Potassium 3.5 - 5.1 mmol/L 3.6  3.8  3.9   Chloride 98 - 111 mmol/L 106  108  107   CO2 22 - 32 mmol/L 32  26  26   Calcium 8.9 - 10.3 mg/dL 10.8  11.4  9.2   Total Protein 6.5 - 8.1 g/dL 6.9  7.2  5.9   Total Bilirubin 0.3 - 1.2 mg/dL 1.2  1.2  1.6   Alkaline Phos 38 - 126 U/L 89  85  65   AST 15 - 41 U/L '21  27  27   '$ ALT 0 - 44 U/L 29  42  41    . Lab Results  Component Value Date   IRON 130 04/14/2022   TIBC 333 04/14/2022   IRONPCTSAT 39 (H) 04/14/2022   (Iron and TIBC)  Lab Results  Component Value Date   FERRITIN 442 (H) 04/14/2022   Component     Latest Ref Rng & Units 02/01/2018  IgG (Immunoglobin G), Serum     700 - 1,600 mg/dL 864  IgA     64 - 422 mg/dL 194  IgM (Immunoglobulin M), Srm     26 - 217 mg/dL 71  Total Protein ELP     6.0 - 8.5 g/dL 6.8  Albumin SerPl Elph-Mcnc     2.9 - 4.4 g/dL 3.8  Alpha 1     0.0 - 0.4  g/dL 0.3  Alpha2 Glob SerPl Elph-Mcnc     0.4 - 1.0 g/dL 0.7  B-Globulin SerPl Elph-Mcnc     0.7 - 1.3 g/dL 1.1  Gamma Glob SerPl Elph-Mcnc     0.4 - 1.8 g/dL 0.9  M Protein SerPl Elph-Mcnc     Not Observed g/dL Not Observed  Globulin, Total     2.2 - 3.9 g/dL 3.0  Albumin/Glob SerPl     0.7 - 1.7 1.3  IFE 1      Comment  Please Note (HCV):      Comment  PTH, Intact     15 - 65 pg/mL 56  Calcium, Total (PTH)     8.7 - 10.3 mg/dL 10.9 (H)  PTH Interp      Comment  Kappa free light chain     3.3 - 19.4 mg/L 23.4 (H)  Lamda free light chains     5.7 - 26.3 mg/L  10.3  Kappa, lamda light chain ratio     0.26 - 1.65 2.27 (H)  Beta-2 Microglobulin     0.6 - 2.4 mg/L 2.9 (H)  Sed Rate     0 - 22 mm/hr 2  LDH     125 - 245 U/L 169    RADIOGRAPHIC STUDIES: I have personally reviewed the radiological images as listed and agreed with the findings in the report. No results found.  ASSESSMENT & PLAN:   Molly Lucas is a 76 y.o. Caucasian female with  1. Severe iron deficiency Anemia due to GI bleeding  2. Hiatal Hernia with h/o cameron ulcers with previous recurrent Iron deficiency needing IV iron replacement in the past. -Found on GI workup and likely cause of GI bleeding leading to anemia.    Lab Results  Component Value Date   IRON 130 04/14/2022   TIBC 333 04/14/2022   IRONPCTSAT 39 (H) 04/14/2022   (Iron and TIBC)  Lab Results  Component Value Date   FERRITIN 442 (H) 04/14/2022    3. H/o Elevated Calcium at 11.7 repeat today improved to 10.8 PTH - WNL Myeloma panel - no M spike and neg IFE K/L ratio minimally abnormal but only minimal increase in Kappa light chains B'2MG'$  -borderline elevated.   PLAN:  Patients labs done today were reviewed with her in detail as noted above CBC shows normal hemoglobin of 14.8 with normal WBC count and platelets Ferritin more than 100 however the patient is still having black stools suggestive of continued GI bleeding. She will follow-up with her gastroenterologist to address ongoing GI bleeding and determine need for repeat EGD. She was recommended not to use any blood thinners or NSAIDs. We shall continue IV iron every 3 months with lab monitoring. FOLLOW UP: [place] with PCP In 4-6 weeks RTC with Dr Irene Limbo with labs and next dose of IV injectafer in 12 weeks  . The total time spent in the appointment was 20 minutes and more than 50% was on counseling and direct patient cares, ordering and management of IV iron  .The total time spent in the appointment was 20 minutes* .  All of the  patient's questions were answered with apparent satisfaction. The patient knows to call the clinic with any problems, questions or concerns.   Sullivan Lone MD MS AAHIVMS [facility] Hematology/Oncology Physician [facility]  .*Total Encounter Time as defined by the Centers for Medicare and Medicaid Services includes, in addition to the face-to-face time of a patient visit (documented in the  note above) non-face-to-face time: obtaining and reviewing outside history, ordering and reviewing medications, tests or procedures, care coordination (communications with other health care professionals or caregivers) and documentation in the medical record.

## 2022-04-29 ENCOUNTER — Ambulatory Visit: Payer: Self-pay | Admitting: General Surgery

## 2022-04-29 NOTE — H&P (Signed)
Chief Complaint: New Patient (New patient gerd large hiatal hernia )       History of Present Illness: Molly Lucas is a 76 y.o. female who is seen today as an office consultation at the request of Dr. Paterson for evaluation of New Patient (New patient gerd large hiatal hernia ) .   Patient is a 76-year-old female who comes in secondary to anemia.  Patient had a long history of anemia with multiple history of transfusions as well as iron transfusions.  Patient had previously undergone GI work-up which revealed Cameron's ulcers.  Patient is also had previous AVM diagnosis in the past.   Patient states that her most recent episode of anemia requiring transfusions was February last year.  States that hemoglobin was 4 at that time.   Patient subsequently has undergone upper upper GI endoscopy, esophagram, these are all been relevant for a large hiatal hernia, and Cameron's ulcers.   Patient denies any significant symptoms of reflux, dysphagia, and does take a daily pantoprazole.   Patient's had no previous abdominal surgery.       Review of Systems: A complete review of systems was obtained from the patient.  I have reviewed this information and discussed as appropriate with the patient.  See HPI as well for other ROS.   Review of Systems  Constitutional:  Negative for fever.  HENT:  Negative for congestion.   Eyes:  Negative for blurred vision.  Respiratory:  Negative for cough, shortness of breath and wheezing.   Cardiovascular:  Negative for chest pain and palpitations.  Gastrointestinal:  Negative for heartburn.  Genitourinary:  Negative for dysuria.  Musculoskeletal:  Negative for myalgias.  Skin:  Negative for rash.  Neurological:  Negative for dizziness and headaches.  Psychiatric/Behavioral:  Negative for depression and suicidal ideas.   All other systems reviewed and are negative.      Medical History: Past Medical History Past Medical History: Diagnosis         Date            Anemia                         Anxiety                         Arthritis                         GERD (gastroesophageal reflux disease)                   History of stroke                  Patient Active Problem List Diagnosis            Hip pain            Hip arthritis     Past Surgical History Past Surgical History: Procedure       Laterality         Date            HERNIA REPAIR                            Allergies Allergies Allergen           Reactions            Penicillins          Swelling                         Tongue swelled and turned black when pt received penicillin as a child       Current Outpatient Medications on File Prior to Visit Medication       Sig       Dispense         Refill            pantoprazole (PROTONIX) 40 MG DR tablet            TAKE ONE TABLET BY MOUTH DAILY BEFORE BREAKFAST                                clindamycin (CLEOCIN) 150 MG capsule                                           clorazepate (TRANXENE) 7.5 MG tablet                                            CYMBALTA 60 mg DR capsule                                              desipramine (NORPRAMIN) 25 MG tablet                                          gabapentin (NEURONTIN) 300 MG capsule                                      HYDROcodone-acetaminophen (NORCO) 5-325 mg tablet                                      losartan (COZAAR) 100 MG tablet                                          nabumetone (RELAFEN) 500 MG tablet                                             oxybutynin (DITROPAN XL) 15 MG XL tablet                                                pravastatin (PRAVACHOL) 20 MG tablet                                

## 2022-06-22 NOTE — Pre-Procedure Instructions (Signed)
Surgical Instructions    Your procedure is scheduled on Tuesday, August 22nd.  Report to [facility] Main Entrance "A" at 7:30 A.M., then check in with the Admitting office.  Call this number if you have problems the morning of surgery:  [phone]   If you have any questions prior to your surgery date call [phone]: Open Monday-Friday 8am-4pm    Remember:  Do not eat after midnight the night before your surgery  You may drink clear liquids until 6:30 a.m. the morning of your surgery.   Clear liquids allowed are: Water, Non-Citrus Juices (without pulp), Carbonated Beverages, Clear Tea, Black Coffee ONLY (NO MILK, CREAM OR POWDERED CREAMER of any kind), and Gatorade.   Patient Instructions  The day of surgery (if you do NOT have diabetes):  Drink ONE (1) Pre-Surgery Clear Ensure by 6:30 am the morning of surgery   This drink was given to you during your hospital  pre-op appointment visit. Nothing else to drink after completing the  Pre-Surgery Clear Ensure.         If you have questions, please contact your surgeon's office.     Take these medicines the morning of surgery with A SIP OF WATER:  cetirizine (ZYRTEC)  pantoprazole (PROTONIX) Eye drops   As of today, STOP taking any Aspirin (unless otherwise instructed by your surgeon) Aleve, Naproxen, Ibuprofen, Motrin, Advil, Goody's, BC's, all herbal medications, fish oil, and all vitamins.           Do not wear jewelry or makeup. Do not wear lotions, powders, perfumes or deodorant. Do not shave 48 hours prior to surgery.  Do not bring valuables to the hospital. Do not wear nail polish, gel polish, artificial nails, or any other type of covering on natural nails (fingers and toes) If you have artificial nails or gel coating that need to be removed by a nail salon, please have this removed prior to surgery. Artificial nails or gel coating may interfere with anesthesia's ability to adequately monitor your vital  signs.  [facility] is not responsible for any belongings or valuables.    Do NOT Smoke (Tobacco/Vaping)  24 hours prior to your procedure  If you use a CPAP at night, you may bring your mask for your overnight stay.   Contacts, glasses, hearing aids, dentures or partials may not be worn into surgery, please bring cases for these belongings   For patients admitted to the hospital, discharge time will be determined by your treatment team.   Patients discharged the day of surgery will not be allowed to drive home, and someone needs to stay with them for 24 hours.   SURGICAL WAITING ROOM VISITATION Patients having surgery or a procedure may have no more than 2 support people in the waiting area - these visitors may rotate.   Children under the age of 8 must have an adult with them who is not the patient. If the patient needs to stay at the hospital during part of their recovery, the visitor guidelines for inpatient rooms apply. Pre-op nurse will coordinate an appropriate time for 1 support person to accompany patient in pre-op.  This support person may not rotate.   Please refer to the [facility] website for the visitor guidelines for Inpatients (after your surgery is over and you are in a regular room).    Special instructions:    Oral Hygiene is also important to reduce your risk of infection.  Remember - BRUSH YOUR TEETH THE MORNING OF SURGERY  WITH YOUR REGULAR TOOTHPASTE   [facility]- Preparing For Surgery  Before surgery, you can play an important role. Because skin is not sterile, your skin needs to be as free of germs as possible. You can reduce the number of germs on your skin by washing with CHG (chlorahexidine gluconate) Soap before surgery.  CHG is an antiseptic cleaner which kills germs and bonds with the skin to continue killing germs even after washing.     Please do not use if you have an allergy to CHG or antibacterial soaps. If your skin becomes reddened/irritated  stop using the CHG.  Do not shave (including legs and underarms) for at least 48 hours prior to first CHG shower. It is OK to shave your face.  Please follow these instructions carefully.     Shower the NIGHT BEFORE SURGERY and the MORNING OF SURGERY with CHG Soap.   If you chose to wash your hair, wash your hair first as usual with your normal shampoo. After you shampoo, rinse your hair and body thoroughly to remove the shampoo.  Then [employer] and genitals (private parts) with your normal soap and rinse thoroughly to remove soap.  After that Use CHG Soap as you would any other liquid soap. You can apply CHG directly to the skin and wash gently with a scrungie or a clean washcloth.   Apply the CHG Soap to your body ONLY FROM THE NECK DOWN.  Do not use on open wounds or open sores. Avoid contact with your eyes, ears, mouth and genitals (private parts). Wash Face and genitals (private parts)  with your normal soap.   Wash thoroughly, paying special attention to the area where your surgery will be performed.  Thoroughly rinse your body with warm water from the neck down.  DO NOT shower/wash with your normal soap after using and rinsing off the CHG Soap.  Pat yourself dry with a CLEAN TOWEL.  Wear CLEAN PAJAMAS to bed the night before surgery  Place CLEAN SHEETS on your bed the night before your surgery  DO NOT SLEEP WITH PETS.   Day of Surgery:  Take a shower with CHG soap. Wear Clean/Comfortable clothing the morning of surgery Do not apply any deodorants/lotions.   Remember to brush your teeth WITH YOUR REGULAR TOOTHPASTE.    If you received a COVID test during your pre-op visit, it is requested that you wear a mask when out in public, stay away from anyone that may not be feeling well, and notify your surgeon if you develop symptoms. If you have been in contact with anyone that has tested positive in the last 10 days, please notify your surgeon.    Please read over the  following fact sheets that you were given.

## 2022-06-23 ENCOUNTER — Encounter (HOSPITAL_COMMUNITY)
Admission: RE | Admit: 2022-06-23 | Discharge: 2022-06-23 | Disposition: A | Discharge status: Home / Self Care | Payer: Medicare Other | Source: Ambulatory Visit | Attending: General Surgery | Admitting: General Surgery

## 2022-06-23 ENCOUNTER — Other Ambulatory Visit: Payer: Self-pay

## 2022-06-23 ENCOUNTER — Encounter: Payer: Self-pay | Admitting: Hematology

## 2022-06-23 ENCOUNTER — Encounter (HOSPITAL_COMMUNITY): Payer: Self-pay

## 2022-06-23 VITALS — BP 139/98 | HR 61 | Temp 98.4°F | Resp 17 | Ht 62.0 in | Wt 197.2 lb

## 2022-06-23 DIAGNOSIS — Z01812 Encounter for preprocedural laboratory examination: Secondary | ICD-10-CM | POA: Insufficient documentation

## 2022-06-23 DIAGNOSIS — D500 Iron deficiency anemia secondary to blood loss (chronic): Secondary | ICD-10-CM | POA: Insufficient documentation

## 2022-06-23 DIAGNOSIS — Z01818 Encounter for other preprocedural examination: Secondary | ICD-10-CM

## 2022-06-23 HISTORY — DX: Allergy, unspecified, initial encounter: T78.40XA

## 2022-06-23 HISTORY — DX: Personal history of other diseases of the digestive system: Z87.19

## 2022-06-23 LAB — CBC
HCT: 44.7 % (ref 36.0–46.0)
Hemoglobin: 15.1 g/dL — ABNORMAL HIGH (ref 12.0–15.0)
MCH: 33.8 pg (ref 26.0–34.0)
MCHC: 33.8 g/dL (ref 30.0–36.0)
MCV: 100 fL (ref 80.0–100.0)
Platelets: 171 10*3/uL (ref 150–400)
RBC: 4.47 MIL/uL (ref 3.87–5.11)
RDW: 12.6 % (ref 11.5–15.5)
WBC: 7 10*3/uL (ref 4.0–10.5)
nRBC: 0 % (ref 0.0–0.2)

## 2022-06-23 LAB — BASIC METABOLIC PANEL
Anion gap: 5 (ref 5–15)
BUN: 16 mg/dL (ref 8–23)
CO2: 27 mmol/L (ref 22–32)
Calcium: 10.5 mg/dL — ABNORMAL HIGH (ref 8.9–10.3)
Chloride: 108 mmol/L (ref 98–111)
Creatinine, Ser: 0.7 mg/dL (ref 0.44–1.00)
GFR, Estimated: >60 mL/min (ref >60)
Glucose, Bld: 101 mg/dL — ABNORMAL HIGH (ref 70–99)
Potassium: 3.9 mmol/L (ref 3.5–5.1)
Sodium: 140 mmol/L (ref 135–145)

## 2022-06-23 NOTE — Progress Notes (Signed)
PCP - Dr. [name]  EKG - 11/21/21 Stress Test - 03/18/16 ECHO - 04/21/21  CPAP -no, pt has a mouthpiece that her dentist made for her and she uses it.   ERAS Protcol - yes PRE-SURGERY Ensure   COVID TEST- No   Anesthesia review: yes, pt has a hiatal hernia that is high up in her chest along with her stomach  Patient denies shortness of breath, fever, cough and chest pain at PAT appointment   All instructions explained to the patient, with a verbal understanding of the material. Patient agrees to go over the instructions while at home for a better understanding. Patient also instructed to self quarantine after being tested for COVID-19. The opportunity to ask questions was provided.

## 2022-06-30 ENCOUNTER — Observation Stay (HOSPITAL_COMMUNITY)
Admission: RE | Admit: 2022-06-30 | Discharge: 2022-07-01 | Disposition: A | Discharge status: Home / Self Care | Payer: Medicare Other | Attending: General Surgery | Admitting: General Surgery

## 2022-06-30 ENCOUNTER — Ambulatory Visit (HOSPITAL_COMMUNITY): Payer: Medicare Other | Admitting: Physician Assistant

## 2022-06-30 ENCOUNTER — Encounter (HOSPITAL_COMMUNITY): Payer: Self-pay | Admitting: General Surgery

## 2022-06-30 ENCOUNTER — Other Ambulatory Visit: Payer: Self-pay

## 2022-06-30 ENCOUNTER — Encounter (HOSPITAL_COMMUNITY)
Admission: RE | Disposition: A | Discharge status: Home / Self Care | Payer: Self-pay | Source: Home / Self Care | Attending: General Surgery

## 2022-06-30 ENCOUNTER — Ambulatory Visit (HOSPITAL_BASED_OUTPATIENT_CLINIC_OR_DEPARTMENT_OTHER): Payer: Medicare Other | Admitting: Anesthesiology

## 2022-06-30 DIAGNOSIS — F418 Other specified anxiety disorders: Secondary | ICD-10-CM

## 2022-06-30 DIAGNOSIS — I10 Essential (primary) hypertension: Secondary | ICD-10-CM | POA: Diagnosis not present

## 2022-06-30 DIAGNOSIS — D508 Other iron deficiency anemias: Secondary | ICD-10-CM | POA: Insufficient documentation

## 2022-06-30 DIAGNOSIS — K254 Chronic or unspecified gastric ulcer with hemorrhage: Secondary | ICD-10-CM | POA: Diagnosis not present

## 2022-06-30 DIAGNOSIS — G4730 Sleep apnea, unspecified: Secondary | ICD-10-CM

## 2022-06-30 DIAGNOSIS — K449 Diaphragmatic hernia without obstruction or gangrene: Secondary | ICD-10-CM

## 2022-06-30 DIAGNOSIS — Z931 Gastrostomy status: Secondary | ICD-10-CM

## 2022-06-30 HISTORY — PX: XI ROBOTIC ASSISTED HIATAL HERNIA REPAIR: SHX6889

## 2022-06-30 HISTORY — PX: INSERTION OF MESH: SHX5868

## 2022-06-30 SURGERY — REPAIR, HERNIA, HIATAL, ROBOT-ASSISTED
Anesthesia: General | Site: Abdomen

## 2022-06-30 MED ORDER — VILAZODONE HCL 40 MG PO TABS: 40.0000 mg | ORAL_TABLET | Freq: Every day | ORAL | Status: DC

## 2022-06-30 MED ORDER — PHENYLEPHRINE HCL-NACL 20-0.9 MG/250ML-% IV SOLN
INTRAVENOUS | Status: DC | PRN
  Administered 2022-06-30: 40 ug/min via INTRAVENOUS

## 2022-06-30 MED ORDER — BUPIVACAINE HCL (PF) 0.25 % IJ SOLN
INTRAMUSCULAR | Status: AC
  Filled 2022-06-30: qty 30

## 2022-06-30 MED ORDER — OXYCODONE HCL 5 MG PO TABS: 5.0000 mg | ORAL_TABLET | Freq: Once | ORAL | Status: DC | PRN

## 2022-06-30 MED ORDER — VILAZODONE HCL 20 MG PO TABS
40.0000 mg | ORAL_TABLET | Freq: Every day | ORAL | Status: DC
  Administered 2022-07-01: 40 mg via ORAL
  Filled 2022-06-30 (×2): qty 2

## 2022-06-30 MED ORDER — LIDOCAINE 2% (20 MG/ML) 5 ML SYRINGE
INTRAMUSCULAR | Status: AC
  Filled 2022-06-30: qty 5

## 2022-06-30 MED ORDER — ONDANSETRON HCL 4 MG/2ML IJ SOLN
INTRAMUSCULAR | Status: AC
  Filled 2022-06-30: qty 2

## 2022-06-30 MED ORDER — DEXAMETHASONE SODIUM PHOSPHATE 10 MG/ML IJ SOLN
4.0000 mg | INTRAMUSCULAR | Status: AC
  Administered 2022-06-30: 4 mg via INTRAVENOUS
  Filled 2022-06-30: qty 1

## 2022-06-30 MED ORDER — LACTATED RINGERS IV SOLN: INTRAVENOUS | Status: DC

## 2022-06-30 MED ORDER — ROCURONIUM BROMIDE 10 MG/ML (PF) SYRINGE
PREFILLED_SYRINGE | INTRAVENOUS | Status: AC
  Filled 2022-06-30: qty 10

## 2022-06-30 MED ORDER — PROPOFOL 10 MG/ML IV BOLUS
INTRAVENOUS | Status: AC
  Filled 2022-06-30: qty 20

## 2022-06-30 MED ORDER — SUGAMMADEX SODIUM 200 MG/2ML IV SOLN
INTRAVENOUS | Status: DC | PRN
  Administered 2022-06-30: 200 mg via INTRAVENOUS

## 2022-06-30 MED ORDER — PHENYLEPHRINE 80 MCG/ML (10ML) SYRINGE FOR IV PUSH (FOR BLOOD PRESSURE SUPPORT)
PREFILLED_SYRINGE | INTRAVENOUS | Status: DC | PRN
  Administered 2022-06-30 (×3): 160 ug via INTRAVENOUS
  Administered 2022-06-30: 80 ug via INTRAVENOUS

## 2022-06-30 MED ORDER — SUFENTANIL CITRATE 50 MCG/ML IV SOLN
INTRAVENOUS | Status: AC
  Filled 2022-06-30: qty 1

## 2022-06-30 MED ORDER — ORAL CARE MOUTH RINSE: 15.0000 mL | Freq: Once | OROMUCOSAL | Status: AC

## 2022-06-30 MED ORDER — ROCURONIUM BROMIDE 10 MG/ML (PF) SYRINGE
PREFILLED_SYRINGE | INTRAVENOUS | Status: DC | PRN
  Administered 2022-06-30 (×3): 10 mg via INTRAVENOUS
  Administered 2022-06-30: 50 mg via INTRAVENOUS

## 2022-06-30 MED ORDER — ENSURE PRE-SURGERY PO LIQD: 296.0000 mL | Freq: Once | ORAL | Status: DC

## 2022-06-30 MED ORDER — BUPIVACAINE HCL (PF) 0.25 % IJ SOLN
INTRAMUSCULAR | Status: DC | PRN
  Administered 2022-06-30: 14 mL

## 2022-06-30 MED ORDER — 0.9 % SODIUM CHLORIDE (POUR BTL) OPTIME
TOPICAL | Status: DC | PRN
  Administered 2022-06-30: 1000 mL

## 2022-06-30 MED ORDER — CHLORHEXIDINE GLUCONATE CLOTH 2 % EX PADS: 6.0000 | MEDICATED_PAD | Freq: Once | CUTANEOUS | Status: DC

## 2022-06-30 MED ORDER — OXYCODONE HCL 5 MG/5ML PO SOLN: 5.0000 mg | Freq: Once | ORAL | Status: DC | PRN

## 2022-06-30 MED ORDER — CHLORHEXIDINE GLUCONATE 0.12 % MT SOLN
15.0000 mL | Freq: Once | OROMUCOSAL | Status: AC
  Administered 2022-06-30: 15 mL via OROMUCOSAL
  Filled 2022-06-30: qty 15

## 2022-06-30 MED ORDER — FENTANYL CITRATE (PF) 100 MCG/2ML IJ SOLN
25.0000 ug | INTRAMUSCULAR | Status: DC | PRN
  Administered 2022-06-30 (×2): 50 ug via INTRAVENOUS

## 2022-06-30 MED ORDER — SODIUM CHLORIDE 0.9 % IR SOLN
Status: DC | PRN
  Administered 2022-06-30: 1000 mL

## 2022-06-30 MED ORDER — LORATADINE 10 MG PO TABS
10.0000 mg | ORAL_TABLET | Freq: Every day | ORAL | Status: DC
  Administered 2022-07-01: 10 mg via ORAL
  Filled 2022-06-30: qty 1

## 2022-06-30 MED ORDER — VANCOMYCIN HCL 1250 MG/250ML IV SOLN
1250.0000 mg | INTRAVENOUS | Status: AC
  Administered 2022-06-30: 1250 mg via INTRAVENOUS
  Filled 2022-06-30: qty 250

## 2022-06-30 MED ORDER — FENTANYL CITRATE (PF) 100 MCG/2ML IJ SOLN
INTRAMUSCULAR | Status: AC
  Filled 2022-06-30: qty 2

## 2022-06-30 MED ORDER — MIDAZOLAM HCL 2 MG/2ML IJ SOLN
INTRAMUSCULAR | Status: DC | PRN
  Administered 2022-06-30: 1 mg via INTRAVENOUS

## 2022-06-30 MED ORDER — PHENYLEPHRINE HCL (PRESSORS) 10 MG/ML IV SOLN
INTRAVENOUS | Status: AC
  Filled 2022-06-30: qty 1

## 2022-06-30 MED ORDER — PHENYLEPHRINE 80 MCG/ML (10ML) SYRINGE FOR IV PUSH (FOR BLOOD PRESSURE SUPPORT)
PREFILLED_SYRINGE | INTRAVENOUS | Status: AC
Start: 2022-06-30 — End: ?
  Filled 2022-06-30: qty 10

## 2022-06-30 MED ORDER — PANTOPRAZOLE SODIUM 40 MG PO TBEC: 40.0000 mg | DELAYED_RELEASE_TABLET | Freq: Every day | ORAL | Status: DC

## 2022-06-30 MED ORDER — VILAZODONE HCL 10 MG PO TABS: 40.0000 mg | ORAL_TABLET | Freq: Every day | ORAL | Status: DC

## 2022-06-30 MED ORDER — SODIUM CHLORIDE (PF) 0.9 % IJ SOLN
INTRAMUSCULAR | Status: DC | PRN
  Administered 2022-06-30: 40 mL

## 2022-06-30 MED ORDER — ONDANSETRON HCL 4 MG/2ML IJ SOLN: 4.0000 mg | Freq: Once | INTRAMUSCULAR | Status: DC | PRN

## 2022-06-30 MED ORDER — SUFENTANIL CITRATE 50 MCG/ML IV SOLN
INTRAVENOUS | Status: DC | PRN
  Administered 2022-06-30: 10 ug via INTRAVENOUS
  Administered 2022-06-30: 5 ug via INTRAVENOUS

## 2022-06-30 MED ORDER — LABETALOL HCL 5 MG/ML IV SOLN
INTRAVENOUS | Status: DC | PRN
  Administered 2022-06-30: 2.5 mg via INTRAVENOUS

## 2022-06-30 MED ORDER — DEXTROSE-NACL 5-0.9 % IV SOLN: INTRAVENOUS | Status: DC

## 2022-06-30 MED ORDER — DEXAMETHASONE SODIUM PHOSPHATE 10 MG/ML IJ SOLN
INTRAMUSCULAR | Status: AC
  Filled 2022-06-30: qty 2

## 2022-06-30 MED ORDER — PROPOFOL 10 MG/ML IV BOLUS
INTRAVENOUS | Status: DC | PRN
  Administered 2022-06-30: 100 mg via INTRAVENOUS

## 2022-06-30 MED ORDER — HYDROMORPHONE HCL 1 MG/ML IJ SOLN
1.0000 mg | INTRAMUSCULAR | Status: DC | PRN
  Administered 2022-06-30 – 2022-07-01 (×3): 1 mg via INTRAVENOUS
  Administered 2022-07-01: 2 mg via INTRAVENOUS
  Filled 2022-06-30: qty 1
  Filled 2022-06-30: qty 2
  Filled 2022-06-30 (×2): qty 1

## 2022-06-30 MED ORDER — PANTOPRAZOLE SODIUM 40 MG PO TBEC
40.0000 mg | DELAYED_RELEASE_TABLET | Freq: Every day | ORAL | Status: DC
  Administered 2022-07-01: 40 mg via ORAL
  Filled 2022-06-30: qty 1

## 2022-06-30 MED ORDER — ACETAMINOPHEN 500 MG PO TABS: 1000.0000 mg | ORAL_TABLET | ORAL | Status: DC

## 2022-06-30 MED ORDER — LIDOCAINE 2% (20 MG/ML) 5 ML SYRINGE
INTRAMUSCULAR | Status: DC | PRN
  Administered 2022-06-30: 40 mg via INTRAVENOUS

## 2022-06-30 MED ORDER — SODIUM CHLORIDE (PF) 0.9 % IJ SOLN
INTRAMUSCULAR | Status: AC
  Filled 2022-06-30: qty 10

## 2022-06-30 MED ORDER — MIDAZOLAM HCL 2 MG/2ML IJ SOLN
INTRAMUSCULAR | Status: AC
Start: 2022-06-30 — End: ?
  Filled 2022-06-30: qty 2

## 2022-06-30 MED ORDER — ACETAMINOPHEN 500 MG PO TABS
1000.0000 mg | ORAL_TABLET | Freq: Once | ORAL | Status: AC
  Administered 2022-06-30: 1000 mg via ORAL
  Filled 2022-06-30: qty 2

## 2022-06-30 MED ORDER — ONDANSETRON HCL 4 MG/2ML IJ SOLN: 4.0000 mg | Freq: Four times a day (QID) | INTRAMUSCULAR | Status: DC | PRN

## 2022-06-30 MED ORDER — BUPIVACAINE LIPOSOME 1.3 % IJ SUSP
INTRAMUSCULAR | Status: AC
Start: 2022-06-30 — End: ?
  Filled 2022-06-30: qty 20

## 2022-06-30 MED ORDER — ONDANSETRON 4 MG PO TBDP: 4.0000 mg | ORAL_TABLET | Freq: Four times a day (QID) | ORAL | Status: DC | PRN

## 2022-06-30 MED ORDER — ONDANSETRON HCL 4 MG/2ML IJ SOLN
INTRAMUSCULAR | Status: DC | PRN
  Administered 2022-06-30: 4 mg via INTRAVENOUS

## 2022-06-30 SURGICAL SUPPLY — 68 items
ADH SKN CLS APL DERMABOND .7 (GAUZE/BANDAGES/DRESSINGS) ×1
APL PRP STRL LF DISP 70% ISPRP (MISCELLANEOUS) ×1
APPLIER CLIP 5 13 M/L LIGAMAX5 (MISCELLANEOUS)
APR CLP MED LRG 5 ANG JAW (MISCELLANEOUS)
CANNULA REDUC XI 12-8 STAPL (CANNULA) ×1
CANNULA REDUCER 12-8 DVNC XI (CANNULA) ×1 IMPLANT
CHLORAPREP W/TINT 26 (MISCELLANEOUS) ×1 IMPLANT
CLIP APPLIE 5 13 M/L LIGAMAX5 (MISCELLANEOUS) IMPLANT
COVER MAYO STAND STRL (DRAPES) ×1 IMPLANT
COVER SURGICAL LIGHT HANDLE (MISCELLANEOUS) ×1 IMPLANT
COVER TIP SHEARS 8 DVNC (MISCELLANEOUS) IMPLANT
COVER TIP SHEARS 8MM DA VINCI (MISCELLANEOUS) ×1
DEFOGGER SCOPE WARMER CLEARIFY (MISCELLANEOUS) ×1 IMPLANT
DERMABOND ADVANCED (GAUZE/BANDAGES/DRESSINGS) ×1
DERMABOND ADVANCED .7 DNX12 (GAUZE/BANDAGES/DRESSINGS) ×1 IMPLANT
DEVICE TROCAR PUNCTURE CLOSURE (ENDOMECHANICALS) ×1 IMPLANT
DRAIN PENROSE 0.5X18 (DRAIN) IMPLANT
DRAPE ARM DVNC X/XI (DISPOSABLE) ×4 IMPLANT
DRAPE CARDIOVASC SPLIT 88X140 (DRAPES) ×1 IMPLANT
DRAPE COLUMN DVNC XI (DISPOSABLE) ×1 IMPLANT
DRAPE DA VINCI XI ARM (DISPOSABLE) ×4
DRAPE DA VINCI XI COLUMN (DISPOSABLE) ×1
DRAPE ORTHO SPLIT 77X108 STRL (DRAPES) ×1
DRAPE SURG ORHT 6 SPLT 77X108 (DRAPES) ×1 IMPLANT
ELECT REM PT RETURN 9FT ADLT (ELECTROSURGICAL) ×1
ELECTRODE REM PT RTRN 9FT ADLT (ELECTROSURGICAL) ×1 IMPLANT
GLOVE BIO SURGEON STRL SZ7.5 (GLOVE) ×2 IMPLANT
GLOVE SURG SYN 7.5  E (GLOVE)
GLOVE SURG SYN 7.5 E (GLOVE) IMPLANT
GLOVE SURG SYN 7.5 PF PI (GLOVE) ×3 IMPLANT
GOWN STRL REUS W/ TWL LRG LVL3 (GOWN DISPOSABLE) ×1 IMPLANT
GOWN STRL REUS W/ TWL XL LVL3 (GOWN DISPOSABLE) ×2 IMPLANT
GOWN STRL REUS W/TWL 2XL LVL3 (GOWN DISPOSABLE) ×1 IMPLANT
GOWN STRL REUS W/TWL LRG LVL3 (GOWN DISPOSABLE) ×2
GOWN STRL REUS W/TWL XL LVL3 (GOWN DISPOSABLE) ×1
KIT BASIN OR (CUSTOM PROCEDURE TRAY) ×1 IMPLANT
KIT TURNOVER KIT B (KITS) IMPLANT
MARKER SKIN DUAL TIP RULER LAB (MISCELLANEOUS) ×1 IMPLANT
MESH BIO-A 7X10 SYN MAT (Mesh General) ×1 IMPLANT
NDL INSUFFLATION 14GA 120MM (NEEDLE) ×1 IMPLANT
NEEDLE 22X1 1/2 (OR ONLY) (NEEDLE) ×1 IMPLANT
NEEDLE INSUFFLATION 14GA 120MM (NEEDLE) ×1 IMPLANT
OBTURATOR OPTICAL STANDARD 8MM (TROCAR)
OBTURATOR OPTICAL STND 8 DVNC (TROCAR)
OBTURATOR OPTICALSTD 8 DVNC (TROCAR) IMPLANT
PENCIL SMOKE EVACUATOR (MISCELLANEOUS) IMPLANT
SCISSORS LAP 5X35 DISP (ENDOMECHANICALS) IMPLANT
SEAL CANN UNIV 5-8 DVNC XI (MISCELLANEOUS) ×3 IMPLANT
SEAL XI 5MM-8MM UNIVERSAL (MISCELLANEOUS) ×3
SEALER VESSEL DA VINCI XI (MISCELLANEOUS) ×1
SEALER VESSEL EXT DVNC XI (MISCELLANEOUS) ×1 IMPLANT
SET IRRIG TUBING LAPAROSCOPIC (IRRIGATION / IRRIGATOR) ×1 IMPLANT
SET TUBE SMOKE EVAC HIGH FLOW (TUBING) ×1 IMPLANT
SPIKE FLUID TRANSFER (MISCELLANEOUS) ×1 IMPLANT
STAPLER CANNULA SEAL DVNC XI (STAPLE) ×1 IMPLANT
STAPLER CANNULA SEAL XI (STAPLE) ×1
STAPLER VISISTAT 35W (STAPLE) IMPLANT
STOPCOCK 4 WAY LG BORE MALE ST (IV SETS) ×1 IMPLANT
SUT ETHIBOND 0 36 GRN (SUTURE) ×2 IMPLANT
SUT ETHIBOND 2 0 SH (SUTURE)
SUT ETHIBOND 2 0 SH 36X2 (SUTURE) IMPLANT
SUT MNCRL AB 4-0 PS2 18 (SUTURE) ×1 IMPLANT
SUT SILK 0 SH 30 (SUTURE) ×1 IMPLANT
SUT VICRYL 0 UR6 27IN ABS (SUTURE) IMPLANT
SYR 30ML SLIP (SYRINGE) ×1 IMPLANT
TRAY FOLEY MTR SLVR 16FR STAT (SET/KITS/TRAYS/PACK) ×1 IMPLANT
TRAY LAPAROSCOPIC MC (CUSTOM PROCEDURE TRAY) ×1 IMPLANT
TROCAR ADV FIXATION 5X100MM (TROCAR) ×1 IMPLANT

## 2022-06-30 NOTE — H&P (Signed)
Chief Complaint: New Patient (New patient gerd large hiatal hernia )       History of Present Illness: Molly Lucas is a 76 y.o. female who is seen today as an office consultation at the request of Dr. Philip Aspen for evaluation of New Patient (New patient gerd large hiatal hernia ) .   Patient is a 76 year old female who comes in secondary to anemia.  Patient had a long history of anemia with multiple history of transfusions as well as iron transfusions.  Patient had previously undergone GI work-up which revealed Cameron's ulcers.  Patient is also had previous AVM diagnosis in the past.   Patient states that her most recent episode of anemia requiring transfusions was February last year.  States that hemoglobin was 4 at that time.   Patient subsequently has undergone upper upper GI endoscopy, esophagram, these are all been relevant for a large hiatal hernia, and Cameron's ulcers.   Patient denies any significant symptoms of reflux, dysphagia, and does take a daily pantoprazole.   Patient's had no previous abdominal surgery.       Review of Systems: A complete review of systems was obtained from the patient.  I have reviewed this information and discussed as appropriate with the patient.  See HPI as well for other ROS.   Review of Systems  Constitutional:  Negative for fever.  HENT:  Negative for congestion.   Eyes:  Negative for blurred vision.  Respiratory:  Negative for cough, shortness of breath and wheezing.   Cardiovascular:  Negative for chest pain and palpitations.  Gastrointestinal:  Negative for heartburn.  Genitourinary:  Negative for dysuria.  Musculoskeletal:  Negative for myalgias.  Skin:  Negative for rash.  Neurological:  Negative for dizziness and headaches.  Psychiatric/Behavioral:  Negative for depression and suicidal ideas.   All other systems reviewed and are negative.      Medical History: Past Medical History Past Medical History: Diagnosis         Date            Anemia                         Anxiety                         Arthritis                         GERD (gastroesophageal reflux disease)                   History of stroke                  Patient Active Problem List Diagnosis            Hip pain            Hip arthritis     Past Surgical History Past Surgical History: Procedure       Laterality         Date            HERNIA REPAIR                            Allergies Allergies Allergen           Reactions            Penicillins  Swelling                         Tongue swelled and turned black when pt received penicillin as a child       Current Outpatient Medications on File Prior to Visit Medication       Sig       Dispense         Refill            pantoprazole (PROTONIX) 40 MG DR tablet            TAKE ONE TABLET BY MOUTH DAILY BEFORE BREAKFAST                                clindamycin (CLEOCIN) 150 MG capsule                                           clorazepate (TRANXENE) 7.5 MG tablet                                            CYMBALTA 60 mg DR capsule                                              desipramine (NORPRAMIN) 25 MG tablet                                          gabapentin (NEURONTIN) 300 MG capsule                                      HYDROcodone-acetaminophen (NORCO) 5-325 mg tablet                                      losartan (COZAAR) 100 MG tablet                                          nabumetone (RELAFEN) 500 MG tablet                                             oxybutynin (DITROPAN XL) 15 MG XL tablet                                                pravastatin (PRAVACHOL) 20 MG tablet  verapamil (CALAN-SR) 240 MG SR tablet                                         vilazodone (VIIBRYD) 40 mg tablet    TAKE ONE TABLET BY MOUTH EVERY DAY with a full meal                   No current facility-administered medications on file prior to  visit.     Family History Family History Problem           Relation           Age of Onset            Skin cancer     Mother              Coronary Artery Disease (Blocked arteries around heart)     Father               Colon cancer   Father          Social History   Tobacco Use Smoking Status           Never Smokeless Tobacco   Never     Social History Social History     Socioeconomic History            Marital status:  Divorced Tobacco Use            Smoking status:          Never            Smokeless tobacco:    Never Substance and Sexual Activity            Alcohol use:    Yes                         Alcohol/week:  2.0 standard drinks                         Types: 2 Glasses of wine per week            Drug use:        Never       Objective:     Vitals:             04/29/22 1356 BP:      128/76 Pulse:  88 Temp:  36.7 C (98 F) SpO2:  96% Weight:            90.3 kg (199 lb) Height: 157.5 cm ('5\' 2"'$ )   Body mass index is 36.4 kg/m.   Physical Exam Constitutional:      Appearance: Normal appearance.  HENT:     Head: Normocephalic and atraumatic.     Mouth/Throat:     Mouth: Mucous membranes are moist.     Pharynx: Oropharynx is clear.  Eyes:     General: No scleral icterus.    Pupils: Pupils are equal, round, and reactive to light.  Cardiovascular:     Rate and Rhythm: Normal rate and regular rhythm.     Pulses: Normal pulses.     Heart sounds: No murmur heard.   No friction rub. No gallop.  Pulmonary:     Effort: Pulmonary effort is normal. No respiratory distress.     Breath sounds: Normal breath sounds. No stridor.  Abdominal:  General: Abdomen is flat.  Musculoskeletal:        General: No swelling.  Skin:    General: Skin is warm.  Neurological:     General: No focal deficit present.     Mental Status: She is alert and oriented to person, place, and time. Mental status is at baseline.  Psychiatric:        Mood and Affect: Mood  normal.        Thought Content: Thought content normal.        Judgment: Judgment normal.     Assessment and Plan: Diagnoses and all orders for this visit:   Hiatal hernia   Chronic [name] ulcer   Other iron deficiency anemia     Molly Lucas is a 76 y.o. female    1.          We will proceed to the OR for a robotic hiatal hernia repair with mesh and fundoplication 2.         All risks and benefits were discussed with the patient, to generally include infection, bleeding, damage to surrounding structures, possible pneumothorax, and recurrence. Alternatives were offered and described.  All questions were answered and the patient voiced understanding of the procedure and wishes to proceed at this point.

## 2022-06-30 NOTE — Op Note (Signed)
06/30/2022  12:10 PM  PATIENT:  Molly Lucas  76 y.o. female  PRE-OPERATIVE DIAGNOSIS:  HIATAL HERNIA, CAMERONS ULCERS  POST-OPERATIVE DIAGNOSIS:  HIATAL HERNIA, CAMERONS ULCERS  PROCEDURE:  Procedure(s): ROBOTIC HIATAL HERNIA AND TOUPET FUNDOPLICATION (N/A) INSERTION OF MESH (N/A)  SURGEON:  Surgeon(s) and Role:    Ralene Ok, MD - Primary  ASSISTANTS: Celene Squibb, RNFA   ANESTHESIA:   local and general  EBL:  minimal   BLOOD ADMINISTERED:none  DRAINS: none   LOCAL MEDICATIONS USED:  BUPIVICAINE  and OTHER exparel  SPECIMEN:  No Specimen  DISPOSITION OF SPECIMEN:  N/A  COUNTS:  YES  TOURNIQUET:  * No tourniquets in log *  DICTATION: .Dragon Dictation  Findings: large hiatal hernia repair.  Gore bio-a mesh. Toupet fundoplication.   The patient was taken back to the operating room and placed in the supine position with bilateral SCDs in place. The patient was prepped and draped in the usual sterile fashion. After appropriate antibiotics were confirmed a timeout was called and all facts were verified.   A Veress needle technique was used to insufflate the abdomen to 15 mm of mercury the paramedian stab incision. Subsequent to this an 8 mm trocar was introduced as was a 8 millimeter camera. At this time the subsequent robotic trochars x3, were then placed adjacent to this trocar approximately 8-10 cm away. Each trocar was inserted under direct visualization, there were total of 4 trochars. A 91m trocar was placed in the midclavicular line.  A 0vicryl was placed to help with closure at the end of the case.The assistant trocar was then placed in the right lower quadrant under direct visualization. The Nathanson retractor was then visualized inserted into the abdomen and the incision just to the left of the falciform ligament. This was then placed to retract the liver appropriately. At this time the patient was positioned in reverse Trendelenburg.   At this  time the robot patient cart was brought to the bedside and placed in good position and the arms were docked to the trochars appropriately. At this time I proceeded to incised the gastrohepatic ligament.  At this time I proceeded to mobilize the stomach inferiorly and visualize the right crus. The peritoneum over the right crus was incised and right crus was identified. I proceeded to dissect this inferiorly until the left crus was seen joining the right crus. Once the right crus was adequately dissected we turned our to the left crus which was dissected away. This required traction of the stomach to the right side. Once this was visualized we then proceeded to circumferentially dissect the esophagus away from the surrounding tissue. The anterior and posterior vagus was seen along the esophagus at the GE junction.  These were both preserved throughout the entire case.  At this time the phrenoesophageal fat pad was dissected away from the esophagus. There was a moderate-sized hiatal hernia seen. I mobilized the esophagus cephalad approximately 4-5 cm, clearing away the surrounding tissue. The anterior hernia sac was dissected away from the stomach and esophagus.  At this time we turned our attention to the greater curvature the stomach and the omentum was mobilized using the robotic vessel sealer. This was taken up to the greater curvature to the hiatus. This mobilized the entire greater curvature to allow mobilization and the wrap. I then proceeded to bring the greater curvature the stomach posterior to the esophagus, and a shoeshine technique was used to evaluate the mobilization of the greater curvature.  At this time I proceeded to close the hiatus using interrupted 0 Ethibonds x 3. This brought together the hiatal closure without undue stricture to the esophagus.   A piece of Gore Bio A hiatal mesh was placed over the hiatal closure and sutured to the crus using 0 Ethibonds sutures x 3.  At this time the  greater curvature was brought around the esophagus and sutured using 0 silk sutures interrupted fashion approximately 1 cm apart x3 on each side of the esophagus in a Toupet fashion. A left collar stitch was then used to gastropexy the stomach from the wrap to the diaphragm just lateral to the left crus as.  A second collar stitch was placed from the wrap to the right crus.  The wrap lay loose with no strangulation of the esophagus.  At this time the robot was undocked. The liver trocar was removed. At this time insufflation was evacuated. Skin was reapproximated for Monocryl subcuticular fashion. The skin was then dressed with Dermabond. The patient tolerated the procedure well and was taken to the recovery room in stable condition.    PLAN OF CARE: Admit for overnight observation  PATIENT DISPOSITION:  PACU - hemodynamically stable.   Delay start of Pharmacological VTE agent (>24hrs) due to surgical blood loss or risk of bleeding: not applicable

## 2022-06-30 NOTE — Anesthesia Postprocedure Evaluation (Signed)
Anesthesia Post Note  Patient: Molly Lucas [facility], [place]  Procedure(s) Performed: ROBOTIC HIATAL HERNIA AND FUNDOPLICATION (Abdomen) INSERTION OF MESH (Abdomen)     Patient location during evaluation: PACU Anesthesia Type: General Level of consciousness: awake and alert Pain management: pain level controlled Vital Signs Assessment: post-procedure vital signs reviewed and stable Respiratory status: spontaneous breathing, nonlabored ventilation and respiratory function stable Cardiovascular status: stable and blood pressure returned to baseline Anesthetic complications: no   No notable events documented.  Last Vitals:  Vitals:   06/30/22 1400 06/30/22 1430  BP: 122/81 121/80  Pulse: (!) 56 (!) 57  Resp: 13 14  Temp:    SpO2: 99% 98%    Last Pain:  Vitals:   06/30/22 1430  TempSrc:   PainSc: [name]

## 2022-06-30 NOTE — Discharge Instructions (Signed)
EATING AFTER YOUR ESOPHAGEAL SURGERY (Stomach Fundoplication, Hiatal Hernia repair, Achalasia surgery, etc)  ######################################################################  EAT Start with a pureed / full liquid diet (see below) Gradually transition to a high fiber diet with a fiber supplement over the next month after discharge.    WALK Walk an hour a day.  Control your pain to do that.    CONTROL PAIN Control pain so that you can walk, sleep, tolerate sneezing/coughing, go up/down stairs.  HAVE A BOWEL MOVEMENT DAILY Keep your bowels regular to avoid problems.  OK to try a laxative to override constipation.  OK to use an antidairrheal to slow down diarrhea.  Call if not better after 2 tries  CALL IF YOU HAVE PROBLEMS/CONCERNS Call if you are still struggling despite following these instructions. Call if you have concerns not answered by these instructions  ######################################################################   After your esophageal surgery, expect some sticking with swallowing over the next 1-2 months.    If food sticks when you eat, it is called "dysphagia".  This is due to swelling around your esophagus at the wrap & hiatal diaphragm repair.  It will gradually ease off over the next few months.  To help you through this temporary phase, we start you out on a pureed (blenderized) diet.  Your first meal in the hospital was thin liquids.  You should have been given a pureed diet by the time you left the hospital.  We ask patients to stay on a pureed diet for the first 2-3 weeks to avoid anything getting "stuck" near your recent surgery.  Don't be alarmed if your ability to swallow doesn't progress according to this plan.  Everyone is different and some diets can advance more or less quickly.    It is often helpful to crush your medications or split them as they can sometimes stick, especially the first week or so.   Some BASIC RULES to follow  are:  Maintain an upright position whenever eating or drinking.  Take small bites - just a teaspoon size bite at a time.  Eat slowly.  It may also help to eat only one food at a time.  Consider nibbling through smaller, more frequent meals & avoid the urge to eat BIG meals  Do not push through feelings of fullness, nausea, or bloatedness  Do not mix solid foods and liquids in the same mouthful  Try not to "wash foods down" with large gulps of liquids.  Avoid carbonated (bubbly/fizzy) drinks.    Avoid foods that make you feel gassy or bloated.  Start with bland foods first.  Wait on trying greasy, fried, or spicy meals until you are tolerating more bland solids well.  Understand that it will be hard to burp and belch at first.  This gradually improves with time.  Expect to be more gassy/flatulent/bloated initially.  Walking will help your body manage it better.  Consider using medications for bloating that contain simethicone such as  Maalox or Gas-X   Consider crushing her medications, especially smaller pills.  The ability to swallow pills should get easier after a few weeks  Eat in a relaxed atmosphere & minimize distractions.  Avoid talking while eating.    Do not use straws.  Following each meal, sit in an upright position (90 degree angle) for 60 to 90 minutes.  Going for a short walk can help as well  If food does stick, don't panic.  Try to relax and let the food pass on its own.    Sipping WARM LIQUID such as strong hot black tea can also help slide it down.   Be gradual in changes & use common sense:  -If you easily tolerating a certain "level" of foods, advance to the next level gradually -If you are having trouble swallowing a particular food, then avoid it.   -If food is sticking when you advance your diet, go back to thinner previous diet (the lower LEVEL) for 1-2 days.  LEVEL 1 = PUREED DIET  Do for the first 2 WEEKS AFTER SURGERY  -Foods in this group are  pureed or blenderized to a smooth, mashed potato-like consistency.  -If necessary, the pureed foods can keep their shape with the addition of a thickening agent.   -Meat should be pureed to a smooth, pasty consistency.  Hot broth or gravy may be added to the pureed meat, approximately 1 oz. of liquid per 3 oz. serving of meat. -CAUTION:  If any foods do not puree into a smooth consistency, swallowing will be more difficult.  (For example, nuts or seeds sometimes do not blend well.)  Hot Foods Cold Foods  Pureed scrambled eggs and cheese Pureed cottage cheese  Baby cereals Thickened juices and nectars  Thinned cooked cereals (no lumps) Thickened milk or eggnog  Pureed French toast or pancakes Ensure  Mashed potatoes Ice cream  Pureed parsley, au gratin, scalloped potatoes, candied sweet potatoes Fruit or Italian ice, sherbet  Pureed buttered or alfredo noodles Plain yogurt  Pureed vegetables (no corn or peas) Instant breakfast  Pureed soups and creamed soups Smooth pudding, mousse, custard  Pureed scalloped apples Whipped gelatin  Gravies Sugar, syrup, honey, jelly  Sauces, cheese, tomato, barbecue, white, creamed Cream  Any baby food Creamer  Alcohol in moderation (not beer or champagne) Margarine  Coffee or tea Mayonnaise   Ketchup, mustard   Apple sauce   SAMPLE MENU:  PUREED DIET Breakfast Lunch Dinner   Orange juice, 1/2 cup  Cream of wheat, 1/2 cup  Pineapple juice, 1/2 cup  Pureed turkey, barley soup, 3/4 cup  Pureed Hawaiian chicken, 3 oz   Scrambled eggs, mashed or blended with cheese, 1/2 cup  Tea or coffee, 1 cup   Whole milk, 1 cup   Non-dairy creamer, 2 Tbsp.  Mashed potatoes, 1/2 cup  Pureed cooled broccoli, 1/2 cup  Apple sauce, 1/2 cup  Coffee or tea  Mashed potatoes, 1/2 cup  Pureed spinach, 1/2 cup  Frozen yogurt, 1/2 cup  Tea or coffee      LEVEL 2 = SOFT DIET  After your first 2 weeks, you can advance to a soft diet.   Keep on this  diet until everything goes down easily.  Hot Foods Cold Foods  White fish Cottage cheese  Stuffed fish Junior baby fruit  Baby food meals Semi thickened juices  Minced soft cooked, scrambled, poached eggs nectars  Souffle & omelets Ripe mashed bananas  Cooked cereals Canned fruit, pineapple sauce, milk  potatoes Milkshake  Buttered or Alfredo noodles Custard  Cooked cooled vegetable Puddings, including tapioca  Sherbet Yogurt  Vegetable soup or alphabet soup Fruit ice, Italian ice  Gravies Whipped gelatin  Sugar, syrup, honey, jelly Junior baby desserts  Sauces:  Cheese, creamed, barbecue, tomato, white Cream  Coffee or tea Margarine   SAMPLE MENU:  LEVEL 2 Breakfast Lunch Dinner   Orange juice, 1/2 cup  Oatmeal, 1/2 cup  Scrambled eggs with cheese, 1/2 cup  Decaffeinated tea, 1 cup  Whole milk, 1 cup    Non-dairy creamer, 2 Tbsp  Pineapple juice, 1/2 cup  Minced beef, 3 oz  Gravy, 2 Tbsp  Mashed potatoes, 1/2 cup  Minced fresh broccoli, 1/2 cup  Applesauce, 1/2 cup  Coffee, 1 cup  Turkey, barley soup, 3/4 cup  Minced Hawaiian chicken, 3 oz  Mashed potatoes, 1/2 cup  Cooked spinach, 1/2 cup  Frozen yogurt, 1/2 cup  Non-dairy creamer, 2 Tbsp      LEVEL 3 = CHOPPED DIET  -After all the foods in level 2 (soft diet) are passing through well you should advance up to more chopped foods.  -It is still important to cut these foods into small pieces and eat slowly.  Hot Foods Cold Foods  Poultry Cottage cheese  Chopped Swedish meatballs Yogurt  Meat salads (ground or flaked meat) Milk  Flaked fish (tuna) Milkshakes  Poached or scrambled eggs Soft, cold, dry cereal  Souffles and omelets Fruit juices or nectars  Cooked cereals Chopped canned fruit  Chopped French toast or pancakes Canned fruit cocktail  Noodles or pasta (no rice) Pudding, mousse, custard  Cooked vegetables (no frozen peas, corn, or mixed vegetables) Green salad  Canned small sweet peas  Ice cream  Creamed soup or vegetable soup Fruit ice, Italian ice  Pureed vegetable soup or alphabet soup Non-dairy creamer  Ground scalloped apples Margarine  Gravies Mayonnaise  Sauces:  Cheese, creamed, barbecue, tomato, white Ketchup  Coffee or tea Mustard   SAMPLE MENU:  LEVEL 3 Breakfast Lunch Dinner   Orange juice, 1/2 cup  Oatmeal, 1/2 cup  Scrambled eggs with cheese, 1/2 cup  Decaffeinated tea, 1 cup  Whole milk, 1 cup  Non-dairy creamer, 2 Tbsp  Ketchup, 1 Tbsp  Margarine, 1 tsp  Salt, 1/4 tsp  Sugar, 2 tsp  Pineapple juice, 1/2 cup  Ground beef, 3 oz  Gravy, 2 Tbsp  Mashed potatoes, 1/2 cup  Cooked spinach, 1/2 cup  Applesauce, 1/2 cup  Decaffeinated coffee  Whole milk  Non-dairy creamer, 2 Tbsp  Margarine, 1 tsp  Salt, 1/4 tsp  Pureed turkey, barley soup, 3/4 cup  Barbecue chicken, 3 oz  Mashed potatoes, 1/2 cup  Ground fresh broccoli, 1/2 cup  Frozen yogurt, 1/2 cup  Decaffeinated tea, 1 cup  Non-dairy creamer, 2 Tbsp  Margarine, 1 tsp  Salt, 1/4 tsp  Sugar, 1 tsp    LEVEL 4:  REGULAR FOODS  -Foods in this group are soft, moist, regularly textured foods.   -This level includes meat and breads, which tend to be the hardest things to swallow.   -Eat very slowly, chew well and continue to avoid carbonated drinks. -most people are at this level in 4-6 weeks  Hot Foods Cold Foods  Baked fish or skinned Soft cheeses - cottage cheese  Souffles and omelets Cream cheese  Eggs Yogurt  Stuffed shells Milk  Spaghetti with meat sauce Milkshakes  Cooked cereal Cold dry cereals (no nuts, dried fruit, coconut)  French toast or pancakes Crackers  Buttered toast Fruit juices or nectars  Noodles or pasta (no rice) Canned fruit  Potatoes (all types) Ripe bananas  Soft, cooked vegetables (no corn, lima, or baked beans) Peeled, ripe, fresh fruit  Creamed soups or vegetable soup Cakes (no nuts, dried fruit, coconut)  Canned chicken  noodle soup Plain doughnuts  Gravies Ice cream  Bacon dressing Pudding, mousse, custard  Sauces:  Cheese, creamed, barbecue, tomato, white Fruit ice, Italian ice, sherbet  Decaffeinated tea or coffee Whipped gelatin  Pork chops Regular gelatin     Canned fruited gelatin molds   Sugar, syrup, honey, jam, jelly   Cream   Non-dairy   Margarine   Oil   Mayonnaise   Ketchup   Mustard   TROUBLESHOOTING IRREGULAR BOWELS  1) Avoid extremes of bowel movements (no bad constipation/diarrhea)  2) Miralax 17gm mixed in 8oz. water or juice-daily. May use BID as needed.  3) Gas-x,Phazyme, etc. as needed for gas & bloating.  4) Soft,bland diet. No spicy,greasy,fried foods.  5) Prilosec over-the-counter as needed  6) May hold gluten/wheat products from diet to see if symptoms improve.  7) May try probiotics (Align, Activa, etc) to help calm the bowels down  7) If symptoms become worse call back immediately.    If you have any questions please call our office at [facility]: [phone].  

## 2022-06-30 NOTE — Anesthesia Preprocedure Evaluation (Addendum)
Anesthesia Evaluation  Patient identified by MRN, date of birth, ID band Patient awake    Reviewed: Allergy & Precautions, NPO status , Patient's Chart, lab work & pertinent test results  History of Anesthesia Complications Negative for: history of anesthetic complications  Airway Mallampati: I  TM Distance: >3 FB Neck ROM: Full    Dental  (+) Dental Advisory Given, Teeth Intact   Pulmonary sleep apnea (mouthpiece) ,    Pulmonary exam normal        Cardiovascular hypertension (no meds), Normal cardiovascular exam   '22 TTE - EF 60 to 65%. Aortic valve regurgitation is mild.     Neuro/Psych PSYCHIATRIC DISORDERS Anxiety Depression negative neurological ROS     GI/Hepatic Neg liver ROS, hiatal hernia, PUD, GERD  Medicated and Controlled,  Endo/Other   Obesity   Renal/GU negative Renal ROS     Musculoskeletal  (+) Arthritis , Osteoarthritis,    Abdominal   Peds  Hematology negative hematology ROS (+)   Anesthesia Other Findings   Reproductive/Obstetrics                           Anesthesia Physical Anesthesia Plan  ASA: 3  Anesthesia Plan: General   Post-op Pain Management: Tylenol PO (pre-op)*   Induction: Intravenous  PONV Risk Score and Plan: 3 and Treatment may vary due to age or medical condition, Ondansetron and Dexamethasone  Airway Management Planned: Oral ETT  Additional Equipment: None  Intra-op Plan:   Post-operative Plan: Extubation in OR  Informed Consent: I have reviewed the patients History and Physical, chart, labs and discussed the procedure including the risks, benefits and alternatives for the proposed anesthesia with the patient or authorized representative who has indicated his/her understanding and acceptance.     Dental advisory given  Plan Discussed with: CRNA and Anesthesiologist  Anesthesia Plan Comments:        Anesthesia Quick  Evaluation

## 2022-06-30 NOTE — Interval H&P Note (Signed)
History and Physical Interval Note:  06/30/2022 9:40 AM  Molly Lucas  has presented today for surgery, with the diagnosis of HIATAL HERNIA, CAMERONS ULCERS.  The various methods of treatment have been discussed with the patient and family. After consideration of risks, benefits and other options for treatment, the patient has consented to  Procedure(s): ROBOTIC HIATAL HERNIA AND FUNDOPLICATION (N/A) as a surgical intervention.  The patient's history has been reviewed, patient examined, no change in status, stable for surgery.  I have reviewed the patient's chart and labs.  Questions were answered to the patient's satisfaction.     Ralene Ok

## 2022-06-30 NOTE — Transfer of Care (Signed)
Immediate Anesthesia Transfer of Care Note  Patient: Molly Lucas [facility]  Procedure(s) Performed: ROBOTIC HIATAL HERNIA AND FUNDOPLICATION (Abdomen) INSERTION OF MESH (Abdomen)  Patient Location: PACU  Anesthesia Type:General  Level of Consciousness: drowsy and patient cooperative  Airway & Oxygen Therapy: Patient Spontanous Breathing and Patient connected to nasal cannula oxygen  Post-op Assessment: Report given to RN and Post -op Vital signs reviewed and stable  Post vital signs: Reviewed and stable  Last Vitals:  Vitals Value Taken Time  BP 136/88 06/30/22 1230  Temp    Pulse 59 06/30/22 1231  Resp 16 06/30/22 1231  SpO2 97 % 06/30/22 1231  Vitals shown include unvalidated device data.  Last Pain:  Vitals:   06/30/22 0831  TempSrc:   PainSc: 0-No pain         Complications: No notable events documented.

## 2022-06-30 NOTE — Anesthesia Procedure Notes (Signed)
Procedure Name: Intubation Date/Time: 06/30/2022 9:56 AM  Performed by: Moshe Salisbury, CRNAPre-anesthesia Checklist: Patient identified, Emergency Drugs available, Suction available and Patient being monitored Patient Re-evaluated:Patient Re-evaluated prior to induction Oxygen Delivery Method: Circle System Utilized Preoxygenation: Pre-oxygenation with 100% oxygen Induction Type: IV induction Ventilation: Mask ventilation without difficulty Laryngoscope Size: Mac and 3 Grade View: Grade I Tube type: Oral Tube size: 7.5 mm Number of attempts: 1 Airway Equipment and Method: Stylet Placement Confirmation: ETT inserted through vocal cords under direct vision, positive ETCO2 and breath sounds checked- equal and bilateral Secured at: 21 cm Tube secured with: Tape Dental Injury: Teeth and Oropharynx as per pre-operative assessment

## 2022-06-30 NOTE — Plan of Care (Signed)
  Problem: Education: Goal: Knowledge of General Education information will improve Description: Including pain rating scale, medication(s)/side effects and non-pharmacologic comfort measures Outcome: Progressing   Problem: Clinical Measurements: Goal: Respiratory complications will improve Outcome: Progressing   Problem: Nutrition: Goal: Adequate nutrition will be maintained Outcome: Progressing   Problem: Activity: Goal: Risk for activity intolerance will decrease Outcome: Progressing   Problem: Coping: Goal: Level of anxiety will decrease Outcome: Progressing   Problem: Elimination: Goal: Will not experience complications related to bowel motility Outcome: Progressing Goal: Will not experience complications related to urinary retention Outcome: Progressing   Problem: Pain Managment: Goal: General experience of comfort will improve Outcome: Progressing

## 2022-07-01 ENCOUNTER — Observation Stay (HOSPITAL_COMMUNITY): Payer: Medicare Other

## 2022-07-01 ENCOUNTER — Encounter (HOSPITAL_COMMUNITY): Payer: Self-pay | Admitting: General Surgery

## 2022-07-01 DIAGNOSIS — K449 Diaphragmatic hernia without obstruction or gangrene: Secondary | ICD-10-CM | POA: Diagnosis not present

## 2022-07-01 LAB — BASIC METABOLIC PANEL
Anion gap: 3 — ABNORMAL LOW (ref 5–15)
BUN: 12 mg/dL (ref 8–23)
CO2: 26 mmol/L (ref 22–32)
Calcium: 9 mg/dL (ref 8.9–10.3)
Chloride: 112 mmol/L — ABNORMAL HIGH (ref 98–111)
Creatinine, Ser: 0.73 mg/dL (ref 0.44–1.00)
GFR, Estimated: >60 mL/min (ref >60)
Glucose, Bld: 141 mg/dL — ABNORMAL HIGH (ref 70–99)
Potassium: 4.6 mmol/L (ref 3.5–5.1)
Sodium: 141 mmol/L (ref 135–145)

## 2022-07-01 MED ORDER — IOHEXOL 300 MG/ML  SOLN
100.0000 mL | Freq: Once | INTRAMUSCULAR | Status: AC | PRN
Start: 2022-07-01 — End: 2022-07-01
  Administered 2022-07-01: 50 mL via ORAL

## 2022-07-01 MED ORDER — HYDROCODONE-ACETAMINOPHEN 7.5-325 MG/15ML PO SOLN: 10.0000 mL | ORAL | Status: DC | PRN

## 2022-07-01 MED ORDER — HYDROCODONE-ACETAMINOPHEN 7.5-325 MG/15ML PO SOLN
10.0000 mL | ORAL | 0 refills | Status: DC | PRN
Start: 2022-07-01 — End: 2022-07-03

## 2022-07-01 NOTE — Progress Notes (Signed)
1 Day Post-Op   Subjective/Chief Complaint: Pt doing well this AM Tol clears well  Min pain    Objective: Vital signs in last 24 hours: Temp:  [97.6 F (36.4 C)-98.7 F (37.1 C)] 97.8 F (36.6 C) (08/23 0744) Pulse Rate:  [53-102] 57 (08/23 0744) Resp:  [11-20] 16 (08/23 0744) BP: (91-138)/(71-88) 114/76 (08/23 0744) SpO2:  [91 %-99 %] 95 % (08/23 0744) Weight:  [89.4 kg] 89.4 kg (08/22 0813) Last BM Date : 06/29/22  Intake/Output from previous day: 08/22 0701 - 08/23 0700 In: 2001.7 [I.V.:2001.7] Out: 475 [Urine:425; Blood:50] Intake/Output this shift: No intake/output data recorded.  General appearance: alert and cooperative GI: soft, non-tender; bowel sounds normal; no masses,  no organomegaly and inc c/d/i  Lab Results:  No results for input(s): "WBC", "HGB", "HCT", "PLT" in the last 72 hours. BMET Recent Labs    07/01/22 0131  NA 141  K 4.6  CL 112*  CO2 26  GLUCOSE 141*  BUN 12  CREATININE 0.73  CALCIUM 9.0   PT/INR No results for input(s): "LABPROT", "INR" in the last 72 hours. ABG No results for input(s): "PHART", "HCO3" in the last 72 hours.  Invalid input(s): "PCO2", "PO2"  Studies/Results: No results found.  Anti-infectives: Anti-infectives (From admission, onward)    Start     Dose/Rate Route Frequency Ordered Stop   06/30/22 0800  vancomycin (VANCOREADY) IVPB 1250 mg/250 mL        1,250 mg 166.7 mL/hr over 90 Minutes Intravenous On call to O.R. 06/30/22 0753 06/30/22 1009       Assessment/Plan: s/p Procedure(s): ROBOTIC HIATAL HERNIA AND FUNDOPLICATION (N/A) INSERTION OF MESH (N/A) F/u DG esoph today Will adv to pureed if above [facility] later today Pain control  LOS: 0 days    Ralene Ok 07/01/2022

## 2022-07-01 NOTE — Social Work (Addendum)
Butch Penny at [facility] contacted CSW to ask about pt going to SNF. Pt has a DC summary in to go back to the [facility] today at [facility] but the family and the admissions staff at [facility] thought that she would be discharging tomorrow to SNF. CSW will follow up about pt DC plan.   CSW followed up, pt does not require rehab and will DC to [facility]. CSW updated Butch Penny and she stated she will still bring her into SNF but understands the hospital is not recommending it.   CSW signing off.

## 2022-07-01 NOTE — Discharge Summary (Signed)
Physician Discharge Summary  Patient ID: [facility] MRN: [id number] DOB/AGE: 05/11/1946 76 y.o.  Admit date: 06/30/2022 Discharge date: 07/01/2022  Admission Diagnoses:  Discharge Diagnoses:  Principal Problem:   S/P Nissen fundoplication (with gastrostomy tube placement) [facility])   Discharged Condition: good  Hospital Course:  patient was admitted postop.  Patient had a good control of her abdominal pain.  She had negative esophagram postop day 1.  She was tolerating her p.o. well.  Patient had dietitian consultation.  Patient was deemed stable for discharge and discharged home.  Consults: None  Significant Diagnostic Studies: DG esoph with neg leak  Treatments: surgery: as above  Discharge Exam: Blood pressure 114/76, pulse (!) 57, temperature 97.8 F (36.6 C), temperature source Oral, resp. rate 16, height '5\' 2"'$  (1.575 m), weight 89.4 kg, SpO2 95 %. PE:  Constitutional: No acute distress, conversant, appears states age. Eyes: Anicteric sclerae, moist conjunctiva, no lid lag Lungs: Clear to auscultation bilaterally, normal respiratory effort CV: regular rate and rhythm, no murmurs, no peripheral edema, pedal pulses 2+ GI: Soft, no masses or hepatosplenomegaly, non-tender to palpation Skin: No rashes, palpation reveals normal turgor Psychiatric: appropriate judgment and insight, oriented to person, place, and time   Disposition: Discharge disposition: 01-Home or Self Care       Discharge Instructions     Diet - low sodium heart healthy   Complete by: As directed    Increase activity slowly   Complete by: As directed       Allergies as of 07/01/2022       Reactions   Nsaids    Patient suffers from severe GI bleeds.   Penicillins Swelling, Other (See Comments)   Tongue became swollen and turned black Did it involve swelling of the face/tongue/throat, SOB, or low BP? Yes Did it involve sudden or severe rash/hives, skin peeling, or any reaction on the  inside of your mouth or nose? Yes Did you need to seek medical attention at a hospital or doctor's office? Yes When did it last happen? "I was a child"   If all above answers are "NO", may proceed with cephalosporin use.   Other    Bug bites-per patient, any bug that she has been bitten by will cause her anxiety, dermatitis, hives, itching, nausea and swelling.        Medication List     TAKE these medications    Biofreeze 4 % Gel Generic drug: Menthol (Topical Analgesic) Apply 1 Application topically daily.   CALCIUM PO Take 1,000 mg by mouth daily.   cetirizine 10 MG tablet Commonly known as: ZYRTEC Take 10 mg by mouth in the morning and at bedtime.   COLLAGEN PO Take 1 packet by mouth daily.   cyanocobalamin 1000 MCG tablet Take 1,000 mcg by mouth daily.   HYDROcodone-acetaminophen 7.5-325 mg/15 ml solution Commonly known as: HYCET Take 10 mLs by mouth every 4 (four) hours as needed for moderate pain.   Omega-3 1000 MG Caps Take 1,000 mg by mouth daily.   pantoprazole 40 MG tablet Commonly known as: PROTONIX Take 40 mg by mouth daily.   Systane Balance 0.6 % Soln Generic drug: Propylene Glycol Place 1 drop into both eyes as needed (dry eyes).   Systane Overnight Therapy 0.3 % Gel ophthalmic ointment Generic drug: hypromellose Place 1 Application into both eyes at bedtime.   Turmeric 500 MG Caps Take 500 mg by mouth daily. Tumeric with cucumin   Viibryd 40 MG Tabs Generic drug: Vilazodone HCl  Take 40 mg by mouth daily.   vitamin C 1000 MG tablet Take 1,000 mg by mouth daily.   Vitamin D3 50 MCG (2000 UT) capsule Take 4,000 Units by mouth daily.   zinc gluconate 50 MG tablet Take 50 mg by mouth daily.        Follow-up Information     Ralene Ok, MD. Schedule an appointment as soon as possible for a visit in 2 week(s).   Specialty: General Surgery Why: Post op visit Contact information: [address] [place]  [id number] [phone]                 Signed: Ralene Ok 07/01/2022, 1:15 PM

## 2022-07-01 NOTE — Progress Notes (Signed)
Discharge instructions given to pt and patients daughter Theodoro Clock. Patient is being discharged to home back to [facility]. Both patient and daughter verbalized understanding of discharge teaching.

## 2022-07-01 NOTE — Progress Notes (Signed)
RD consulted for nutrition education regarding diet instructions s/p Nissen (pureed diet for 2 weeks post-op).    Met with pt and family in room to discuss diet recommendations after surgery.   Pt lives at [facility] and is able to have meals prepared for her if she likes. States that she has dinner at the dinning room each night and usually eat breakfast and lunch in her apartment. Pt states that she has already spoken with the RD at her facility about needing to modify her diet after surgery.  RD provided "IDDSI Level 4 Pureed [facility]) Nutrition Therapy" handout from the [employer]. Reviewed patient's dietary recall. Provided examples on ways to modify foods on pureed diet. Encouraged pt to blend foods using a blender or food processor and strain to obtain desired consistency (no lumps). Also encouraged use of commercial pureed foods and oral nutritional supplements such as Ensure to provide adequate calories and protein. Discussed how to modify recipes to add calories and protein.    RD discussed why it is important for patient to adhere to diet recommendations and discussed possibility of diet advancement upon MD follow-up. Teach back method used.   Expect excellent compliance.   Current diet order is clear liquids, MD plans to upgrade to puree laster today. Labs and medications reviewed. No further nutrition interventions warranted at this time. RD contact information provided. If additional nutrition issues arise, please re-consult RD.    [facility], RD, LDN Clinical Dietitian RD pager # available in [facility]  After hours/weekend pager # available in [facility]

## 2022-07-01 NOTE — Progress Notes (Signed)
Patient wheeled out via wheelchair by volunteer staff with daughter Theodoro Clock and all belongings.

## 2022-07-02 ENCOUNTER — Encounter: Payer: Self-pay | Admitting: Adult Health

## 2022-07-02 ENCOUNTER — Other Ambulatory Visit: Payer: Self-pay

## 2022-07-02 ENCOUNTER — Non-Acute Institutional Stay (SKILLED_NURSING_FACILITY): Payer: Medicare Other | Admitting: Adult Health

## 2022-07-02 ENCOUNTER — Emergency Department (HOSPITAL_COMMUNITY): Payer: Medicare Other

## 2022-07-02 ENCOUNTER — Emergency Department (HOSPITAL_COMMUNITY)
Admission: EM | Admit: 2022-07-02 | Discharge: 2022-07-03 | Disposition: A | Discharge status: Home / Self Care | Payer: Medicare Other | Attending: Emergency Medicine | Admitting: Emergency Medicine

## 2022-07-02 ENCOUNTER — Encounter (HOSPITAL_COMMUNITY): Payer: Self-pay | Admitting: Emergency Medicine

## 2022-07-02 DIAGNOSIS — I10 Essential (primary) hypertension: Secondary | ICD-10-CM | POA: Diagnosis not present

## 2022-07-02 DIAGNOSIS — K449 Diaphragmatic hernia without obstruction or gangrene: Secondary | ICD-10-CM | POA: Diagnosis not present

## 2022-07-02 DIAGNOSIS — K257 Chronic gastric ulcer without hemorrhage or perforation: Secondary | ICD-10-CM | POA: Diagnosis not present

## 2022-07-02 DIAGNOSIS — R002 Palpitations: Secondary | ICD-10-CM | POA: Diagnosis present

## 2022-07-02 DIAGNOSIS — Z9889 Other specified postprocedural states: Secondary | ICD-10-CM

## 2022-07-02 DIAGNOSIS — K5901 Slow transit constipation: Secondary | ICD-10-CM

## 2022-07-02 DIAGNOSIS — I4891 Unspecified atrial fibrillation: Secondary | ICD-10-CM | POA: Insufficient documentation

## 2022-07-02 DIAGNOSIS — G4733 Obstructive sleep apnea (adult) (pediatric): Secondary | ICD-10-CM

## 2022-07-02 DIAGNOSIS — D500 Iron deficiency anemia secondary to blood loss (chronic): Secondary | ICD-10-CM

## 2022-07-02 MED ORDER — SODIUM CHLORIDE 0.9 % IV BOLUS
1000.0000 mL | Freq: Once | INTRAVENOUS | Status: AC
  Administered 2022-07-03: 1000 mL via INTRAVENOUS

## 2022-07-02 MED ORDER — DOCUSATE SODIUM 100 MG PO CAPS: 100.0000 mg | ORAL_CAPSULE | Freq: Every day | ORAL | 0 refills | Status: AC | PRN

## 2022-07-02 MED ORDER — METOPROLOL TARTRATE 5 MG/5ML IV SOLN
5.0000 mg | Freq: Once | INTRAVENOUS | Status: AC
  Administered 2022-07-03: 5 mg via INTRAVENOUS
  Filled 2022-07-02: qty 5

## 2022-07-02 NOTE — ED Triage Notes (Signed)
Patient arrived via [facility] from  [facility] with complaints palpitations, onset around 1630 in the evening. ECG was completed at the facility and found to have a-fib with no Hx of a-fib. Patient denies any other complaints at this time. Patient had laparoscopic surgery 2 days ago.  EMS reports: HR initially 120-170 and 120-150 after bolus of normal saline BP-126/76

## 2022-07-02 NOTE — Addendum Note (Signed)
Addended by: Barnie Mort on: 07/02/2022 03:58 PM   Modules accepted: Orders

## 2022-07-02 NOTE — ED Provider Notes (Signed)
[facility] EMERGENCY DEPARTMENT Provider Note   CSN: [id number] Arrival date & time: 07/02/22  2240     History  Chief Complaint  Patient presents with   Atrial Fibrillation   Palpitations    Molly Lucas is a 76 y.o. female.  76 yo F with a chief complaints of new onset atrial fibrillation.  Patient was just in the hospital yesterday and had a Nissen implication.  She was discharged home and was being evaluated by a provider at her assisted living facility.  Was noted to have irregular heartbeat and then confirmed on EKG.  No history of A-fib.  Not on blood thinners.  She does not have any particular A-fib symptoms.  States that she did feel some palpitations earlier.  She is having some abdominal discomfort though is just postsurgery.  She has been able to eat and drink.  No nausea or vomiting.  Passing flatus no bowel movement.   Atrial Fibrillation  Palpitations      Home Medications Prior to Admission medications   Medication Sig Start Date End Date Taking? Authorizing Provider  metoprolol tartrate (LOPRESSOR) 25 MG tablet Take 1 tablet (25 mg total) by mouth 2 (two) times daily. 07/03/22 08/02/22 Yes Deno Etienne, DO  Ascorbic Acid (VITAMIN C) 1000 MG tablet Take 1,000 mg by mouth daily.    [provider]  cetirizine (ZYRTEC) 10 MG tablet Take 10 mg by mouth in the morning and at bedtime.    [provider]  Cholecalciferol (VITAMIN D3) 50 MCG (2000 UT) capsule Take 4,000 Units by mouth daily.    [provider]  COLLAGEN PO Take 1 packet by mouth daily.    [provider]  cyanocobalamin 1000 MCG tablet Take 1,000 mcg by mouth daily.    [provider]  docusate sodium (COLACE) 100 MG capsule Take 1 capsule (100 mg total) by mouth daily as needed for mild constipation. 07/02/22   Royal Hawthorn, NP  HYDROcodone-acetaminophen (HYCET) 7.5-325 mg/15 ml solution Take 10 mLs by mouth every 4 (four) hours as  needed for moderate pain. 07/01/22   Ralene Ok, MD  hypromellose (SYSTANE OVERNIGHT THERAPY) 0.3 % GEL ophthalmic ointment Place 1 Application into both eyes at bedtime.    [provider]  Menthol, Topical Analgesic, (BIOFREEZE) 4 % GEL Apply 1 Application topically daily.    [provider]  Omega-3 1000 MG CAPS Take 1,000 mg by mouth daily.    [provider]  pantoprazole (PROTONIX) 40 MG tablet Take 40 mg by mouth daily.    [provider]  Propylene Glycol (SYSTANE BALANCE) 0.6 % SOLN Place 1 drop into both eyes as needed (dry eyes).    [provider]  VIIBRYD 40 MG TABS Take 40 mg by mouth daily. 12/24/20   [provider]      Allergies    Nsaids, Penicillins, and Other    Review of Systems   Review of Systems  Cardiovascular:  Positive for palpitations.    Physical Exam Updated Vital Signs BP 101/67   Pulse 88   Temp 97.6 F (36.4 C) (Oral)   Resp 19   Ht '5\' 2"'$  (1.575 m)   Wt 88.5 kg   SpO2 95%   BMI 35.67 kg/m  Physical Exam Vitals and nursing note reviewed.  Constitutional:      General: She is not in acute distress.    Appearance: She is well-developed. She is not diaphoretic.  HENT:  Head: Normocephalic and atraumatic.  Eyes:     Pupils: Pupils are equal, round, and reactive to light.  Cardiovascular:     Rate and Rhythm: Normal rate and regular rhythm.     Heart sounds: No murmur heard.    No friction rub. No gallop.  Pulmonary:     Effort: Pulmonary effort is normal.     Breath sounds: No wheezing or rales.  Abdominal:     General: There is distension.     Palpations: Abdomen is soft.     Tenderness: There is no abdominal tenderness.     Comments: Mildly distended abdomen.  Laparoscopic incisions sealed with Dermabond.  Bruising around the incisions.  No significant erythema or warmth.  Musculoskeletal:        General: No tenderness.     Cervical back: Normal range of motion and neck  supple.  Skin:    General: Skin is warm and dry.  Neurological:     Mental Status: She is alert and oriented to person, place, and time.  Psychiatric:        Behavior: Behavior normal.     ED Results / Procedures / Treatments   Labs (all labs ordered are listed, but only abnormal results are displayed) Labs Reviewed  CBC WITH DIFFERENTIAL/PLATELET - Abnormal; Notable for the following components:      Result Value   RBC 3.86 (*)    MCV 103.9 (*)    All other components within normal limits  COMPREHENSIVE METABOLIC PANEL - Abnormal; Notable for the following components:   Glucose, Bld 104 (*)    Total Protein 5.2 (*)    Albumin 3.1 (*)    ALT 71 (*)    All other components within normal limits  LIPASE, BLOOD  MAGNESIUM  TSH    EKG EKG Interpretation  Date/Time:  Thursday July 02 2022 22:51:41 EDT Ventricular Rate:  96 PR Interval:    QRS Duration: 76 QT Interval:  303 QTC Calculation: 383 R Axis:   112 Text Interpretation: Atrial fibrillation Right axis deviation Borderline T abnormalities, anterior leads Since last tracing rate faster Otherwise no significant change Confirmed by Deno Etienne [phone]) on 07/02/2022 11:03:34 PM  Radiology DG Chest Port 1 View  Result Date: 07/02/2022 CLINICAL DATA:  New atrial fibrillation. Recent laparoscopic surgery this week, hiatal hernia repair. EXAM: PORTABLE CHEST 1 VIEW COMPARISON:  None Available. FINDINGS: Low lung volumes. The heart is normal in size for technique. Aortic atherosclerosis and tortuosity. Streaky opacities at the left lung base likely related to atelectasis. There may be a small amount of left pleural fluid. No pneumothorax. No pulmonary edema. IMPRESSION: 1. Low lung volumes with left basilar atelectasis and possible small left pleural effusion. 2. Aortic atherosclerosis and tortuosity. Electronically Signed   By: Keith Rake M.D.   On: 07/02/2022 23:55   DG ESOPHAGUS W SINGLE CM (SOL OR THIN BA)  Result  Date: 07/01/2022 CLINICAL DATA:  Patient 1 day postop robotic hiatal hernia and fundoplication. Request for single contrast medium esophagram to evaluate for leak. EXAM: ESOPHAGUS/BARIUM SWALLOW/TABLET STUDY TECHNIQUE: Single contrast examination was performed using thin liquid barium. This exam was performed by Narda Rutherford, NP, and was supervised and interpreted by Nelson Chimes, MD. FLUOROSCOPY: Radiation Exposure Index (as provided by the fluoroscopic device): 69.10 mGy Kerma COMPARISON:  Esophagram dated 11/26/2021 FINDINGS: Swallowing: Appears normal. No penetration or aspiration seen. Pharynx: Unremarkable. Esophagus: No esophageal mass. Esophageal motility: Age-related esophageal dysmotility. Hiatal Hernia: None. Gastroesophageal reflux: None visualized.  Ingested 64m barium tablet: Not given Other: Narrowing at fundoplication presumably due to postoperative swelling with small passage of contrast. IMPRESSION: Age-related esophageal dysmotility. Narrowing at fundoplication presumably due to postoperative swelling with small volume passage of contrast into the stomach. No leak visualized during exam. Read by: SNarda Rutherford AGNP-BC Electronically Signed   By: MNelson ChimesM.D.   On: 07/01/2022 11:11    Procedures Procedures    Medications Ordered in ED Medications  sodium chloride 0.9 % bolus 1,000 mL (0 mLs Intravenous Stopped 07/03/22 0438)  metoprolol tartrate (LOPRESSOR) injection 5 mg (5 mg Intravenous Given 07/03/22 0146)    ED Course/ Medical Decision Making/ A&P                           Medical Decision Making Amount and/or Complexity of Data Reviewed Labs: ordered. Radiology: ordered. ECG/medicine tests: ordered.  Risk Prescription drug management.   76yo F with a chief complaints of A-fib.  This was noted at her evaluation at her assisted living facility this evening.  Sent here for evaluation.  Patient tells me that she is not a good candidate for blood thinners as she had  chronic anemia and required iron infusions.  She just had laparoscopic Nissen procedure performed yesterday.  Difficult to assess for time of onset but likely less than 48 hours as she was just in the hospital and did not have any documented A-fib.  We will obtain a laboratory evaluation bolus of IV fluids dose of rate controlling agents and reassess.  Patient without anemia though slightly lower than last check.  Her LFTs and lipase unremarkable.  No significant electrolyte abnormality magnesium is normal.  Chest x-ray independently  interpreted by me without focal infiltrate or pneumothorax.  I discussed case with the cardiology fellow on-call, Dr. KHumphrey Rolls we discussed patient's concern about anticoagulation and recurrent GI bleeding.  He recommended holding off on anticoagulation at this time.  Since we are unable to anticoagulate also did not feel like cardioversion was appropriate.  Patient is currently rate controlled after a single bolus dose of metoprolol.  Plan to discharge home with follow-up with the cardiology clinic.  I was noted by nursing that the patient was mildly hypotensive.  I wonder if this is a reaction to morphine that was given for abdominal discomfort.  We will give a small bolus of IV fluids and observe  6:04 AM:  I have discussed the diagnosis/risks/treatment options with the patient.  Evaluation and diagnostic testing in the emergency department does not suggest an emergent condition requiring admission or immediate intervention beyond what has been performed at this time.  They will follow up with  PCP. We also discussed returning to the ED immediately if new or worsening sx occur. We discussed the sx which are most concerning (e.g., sudden worsening pain, fever, inability to tolerate by mouth) that necessitate immediate return. Medications administered to the patient during their visit and any new prescriptions provided to the patient are listed below.  Medications given during  this visit Medications  sodium chloride 0.9 % bolus 1,000 mL (0 mLs Intravenous Stopped 07/03/22 0438)  metoprolol tartrate (LOPRESSOR) injection 5 mg (5 mg Intravenous Given 07/03/22 0146)     The patient appears reasonably screen and/or stabilized for discharge and I doubt any other medical condition or other [facility] further screening, evaluation, or treatment in the ED at this time prior to discharge.  Final Clinical Impression(s) / ED Diagnoses Final diagnoses:  Atrial fibrillation with RVR ([place])    Rx / DC Orders ED Discharge Orders          Ordered    metoprolol tartrate (LOPRESSOR) 25 MG tablet  2 times daily        07/03/22 0539    Ambulatory referral to Cardiology       Comments: If you have not heard from the Cardiology office within the next 72 hours please call [phone].   07/03/22 [place], [place], DO 07/03/22 5749

## 2022-07-02 NOTE — Progress Notes (Addendum)
Location:  [place] Room Number: 158/A Place of Service:  SNF (31) Provider: Cleophus Molt  Patient Care Team: Donnajean Lopes, MD as PCP - General (Internal Medicine)  Extended Emergency Contact Information Primary Emergency Contact: Chandler,Marilyn Address: [address]          [place], [place] Phone: [phone] Mobile Phone: [phone] Relation: Friend Secondary Emergency Contact: Charlette Caffey [place] Mobile Phone: [phone] Relation: Son  Code Status:  Full code Goals of care: Advanced Directive information    07/02/2022   10:38 AM  Advanced Directives  Does Patient Have a Medical Advance Directive? Yes  Type of Paramedic of [place];Living will  Does patient want to make changes to medical advance directive? No - Patient declined  Copy of [place] in Chart? Yes - validated most recent copy scanned in chart (See row information)     Chief Complaint  Patient presents with   [facility] follow up    HPI:  Pt is a 76 y.o. female seen today for a hospital f/u after robotic nissen fundoplication 9/79/89 1 night hospital stay.  She has a hx of anemia, blood transfusions, iron transfusions, GI workup which revealed cameron ulcers, and hx of AVMs. Also PMH significant for anxiety, HTN, HLD, sleep apnea with mouth piece, depression, and GERD.  She was found to have a large hiatal hernia and cameron ulcers 11/04/21 on upper endo. She underwent the above aforementioned procedure and had post op esophagram which showed no leak. She is here in skilled care rehab for therapy. She is already up walking on her own. She is recommended to be on a puree diet.  No pork for religious reasons. She has not had a BM since Monday 8/21.  She tried applesauce this morning but no result. She is having some abd pain which is relieved with  Norco.  Hgb 15.1 06/23/22  Past Medical History:  Diagnosis Date   Allergies    Anemia    iron infusions   Anxiety    Arthritis    Chronic pain of left thumb    Constipation    Deformity of left thumb joint    Z collapse deformity to nondominant left thumb   Depression    Fibroid, uterine    GERD (gastroesophageal reflux disease)    History of blood transfusion    History of hiatal hernia    Hx of seasonal allergies    Hyperlipemia    Hypertension    hx of off medication now. Pt states it was a very brief period of high blood pressure due a stressful time in 2010   OA (osteoarthritis) of hip    left   Sleep apnea    dentist made mouthipiece    Past Surgical History:  Procedure Laterality Date   BIOPSY  03/30/2019   Procedure: BIOPSY;  Surgeon: Thornton Park, MD;  Location: [facility];  Service: Gastroenterology;;   [place]  08/2015   with abductor pollicis longus tendon transfer and suspensionplasty   COLONOSCOPY WITH PROPOFOL N/A 03/31/2019   Procedure: COLONOSCOPY WITH PROPOFOL;  Surgeon: Thornton Park, MD;  Location: [facility];  Service: Gastroenterology;  Laterality: N/A;   DE QUERVAIN'S RELEASE  08/2015   DILATATION & CURRETTAGE/HYSTEROSCOPY WITH RESECTOCOPE  2007   ESOPHAGOGASTRODUODENOSCOPY N/A 11/30/2015   Procedure: ESOPHAGOGASTRODUODENOSCOPY (EGD);  Surgeon: Richmond Campbell, MD;  Location: [facility];  Service:  Endoscopy;  Laterality: N/A;   ESOPHAGOGASTRODUODENOSCOPY (EGD) WITH PROPOFOL N/A 03/30/2019   Procedure: ESOPHAGOGASTRODUODENOSCOPY (EGD) WITH PROPOFOL;  Surgeon: Thornton Park, MD;  Location: [facility];  Service: Gastroenterology;  Laterality: N/A;   ESOPHAGOGASTRODUODENOSCOPY (EGD) WITH PROPOFOL N/A 12/31/2020   Procedure: ESOPHAGOGASTRODUODENOSCOPY (EGD) WITH PROPOFOL;  Surgeon: Irene Shipper, MD;  Location: [facility];  Service: Endoscopy;  Laterality: N/A;   ESOPHAGOGASTRODUODENOSCOPY (EGD) WITH PROPOFOL N/A  11/04/2021   Procedure: ESOPHAGOGASTRODUODENOSCOPY (EGD) WITH PROPOFOL;  Surgeon: Ronnette Juniper, MD;  Location: [facility];  Service: Gastroenterology;  Laterality: N/A;   HERNIA REPAIR Left 06/2012   INGUINAL HERNIA REPAIR Right 05/03/2013   Procedure: HERNIA REPAIR INGUINAL ADULT;  Surgeon: Harl Bowie, MD;  Location: [facility];  Service: General;  Laterality: Right;   INGUINAL HERNIA REPAIR Right 08/23/2013   Procedure: LAPAROSCOPIC INGUINAL HERNIA;  Surgeon: Harl Bowie, MD;  Location: [facility];  Service: General;  Laterality: Right;   INSERTION OF MESH Right 05/03/2013   Procedure: INSERTION OF MESH;  Surgeon: Harl Bowie, MD;  Location: [facility];  Service: General;  Laterality: Right;   INSERTION OF MESH Right 08/23/2013   Procedure: INSERTION OF MESH;  Surgeon: Harl Bowie, MD;  Location: [facility];  Service: General;  Laterality: Right;   INSERTION OF MESH N/A 06/30/2022   Procedure: INSERTION OF MESH;  Surgeon: Ralene Ok, MD;  Location: [facility];  Service: General;  Laterality: N/A;   METACARPOPHALANGEAL JOINT ARTHRODESIS  08/2015   with local bone graft   TONSILLECTOMY     TOTAL HIP ARTHROPLASTY  06/24/2012   Procedure: TOTAL HIP ARTHROPLASTY ANTERIOR APPROACH;  Surgeon: Mcarthur Rossetti, MD;  Location: [facility];  Service: Orthopedics;  Laterality: Left;  Left total hip arthroplasty, anterior approach   UTERINE FIBROID SURGERY  2007   WISDOM TOOTH EXTRACTION     XI ROBOTIC ASSISTED HIATAL HERNIA REPAIR N/A 06/30/2022   Procedure: ROBOTIC HIATAL HERNIA AND FUNDOPLICATION;  Surgeon: Ralene Ok, MD;  Location: [facility];  Service: General;  Laterality: N/A;    Allergies  Allergen Reactions   Nsaids     Patient suffers from severe GI bleeds.   Penicillins Swelling and Other (See Comments)    Tongue became swollen and turned black Did it involve swelling of the face/tongue/throat, SOB, or low BP? Yes Did it involve sudden or severe rash/hives, skin peeling, or  any reaction on the inside of your mouth or nose? Yes Did you need to seek medical attention at a hospital or doctor's office? Yes When did it last happen? "I was a child"   If all above answers are "NO", may proceed with cephalosporin use.    Other     Bug bites-per patient, any bug that she has been bitten by will cause her anxiety, dermatitis, hives, itching, nausea and swelling.    Outpatient Encounter Medications as of 07/02/2022  Medication Sig   Ascorbic Acid (VITAMIN C) 1000 MG tablet Take 1,000 mg by mouth daily.   cetirizine (ZYRTEC) 10 MG tablet Take 10 mg by mouth in the morning and at bedtime.   Cholecalciferol (VITAMIN D3) 50 MCG (2000 UT) capsule Take 4,000 Units by mouth daily.   COLLAGEN PO Take 1 packet by mouth daily.   cyanocobalamin 1000 MCG tablet Take 1,000 mcg by mouth daily.   HYDROcodone-acetaminophen (HYCET) 7.5-325 mg/15 ml solution Take 10 mLs by mouth every 4 (four) hours as needed for moderate pain.   hypromellose (SYSTANE OVERNIGHT THERAPY) 0.3 % GEL  ophthalmic ointment Place 1 Application into both eyes at bedtime.   Menthol, Topical Analgesic, (BIOFREEZE) 4 % GEL Apply 1 Application topically daily.   Omega-3 1000 MG CAPS Take 1,000 mg by mouth daily.   pantoprazole (PROTONIX) 40 MG tablet Take 40 mg by mouth daily.   Propylene Glycol (SYSTANE BALANCE) 0.6 % SOLN Place 1 drop into both eyes as needed (dry eyes).   VIIBRYD 40 MG TABS Take 40 mg by mouth daily.   [DISCONTINUED] CALCIUM PO Take 1,000 mg by mouth daily. (Patient not taking: Reported on 07/02/2022)   [DISCONTINUED] Turmeric 500 MG CAPS Take 500 mg by mouth daily. Tumeric with cucumin   [DISCONTINUED] zinc gluconate 50 MG tablet Take 50 mg by mouth daily.   No facility-administered encounter medications on file as of 07/02/2022.    Review of Systems  Constitutional:  Positive for activity change. Negative for appetite change, chills, diaphoresis, fatigue, fever and unexpected weight change.   HENT:  Negative for congestion.   Respiratory:  Negative for cough, shortness of breath and wheezing.   Cardiovascular:  Negative for chest pain, palpitations and leg swelling.  Gastrointestinal:  Positive for abdominal pain. Negative for abdominal distention, constipation and diarrhea.  Genitourinary:  Negative for difficulty urinating and dysuria.  Musculoskeletal:  Negative for arthralgias, back pain, gait problem, joint swelling and myalgias.  Neurological:  Negative for dizziness, tremors, seizures, syncope, facial asymmetry, speech difficulty, weakness, light-headedness, numbness and headaches.  Psychiatric/Behavioral:  Negative for agitation, behavioral problems and confusion.     Immunization History  Administered Date(s) Administered   Influenza, High Dose Seasonal PF 01/01/2015, 10/14/2016, 09/13/2017, 08/23/2018, 08/03/2019   Influenza-Unspecified 08/03/2019   Moderna Sars-Covid-2 Vaccination 11/21/2019, 12/20/2019   Zoster Recombinat (Shingrix) 08/03/2019   Pertinent  Health Maintenance Due  Topic Date Due   INFLUENZA VACCINE  06/09/2022   DEXA SCAN  Completed   COLONOSCOPY (Pts 45-6yr Insurance coverage will need to be confirmed)  Discontinued      04/14/2022    3:20 PM 06/30/2022    8:31 AM 06/30/2022    6:00 PM 06/30/2022    9:00 PM 07/01/2022    8:00 AM  Fall Risk  Patient Fall Risk Level High fall risk High fall risk Moderate fall risk Moderate fall risk Moderate fall risk   Functional Status Survey:    Vitals:   07/02/22 1032  BP: 107/65  Pulse: (!) 58  Resp: 16  Temp: 99 F (37.2 C)  SpO2: 95%  Weight: 197 lb (89.4 kg)  Height: '5\' 3"'$  (1.6 m)   Body mass index is 34.9 kg/m. Physical Exam Vitals and nursing note reviewed.  Constitutional:      General: She is not in acute distress.    Appearance: She is not diaphoretic.  HENT:     Head: Normocephalic and atraumatic.  Neck:     Vascular: No JVD.  Cardiovascular:     Rate and Rhythm: Normal rate  and regular rhythm.     Heart sounds: No murmur heard. Pulmonary:     Effort: Pulmonary effort is normal. No respiratory distress.     Breath sounds: Normal breath sounds. No wheezing.  Abdominal:     General: Bowel sounds are normal. There is no distension.     Palpations: Abdomen is soft.  Musculoskeletal:     Right lower leg: No edema.     Left lower leg: No edema.  Skin:    General: Skin is warm and dry.     Comments:  Multiple incisions sites with surrounding bruising. No drainage or redness.   Neurological:     Mental Status: She is alert and oriented to person, place, and time.     Labs reviewed: Recent Labs    04/14/22 1450 06/23/22 1350 07/01/22 0131  NA 141 140 141  K 3.6 3.9 4.6  CL 106 108 112*  CO2 32 27 26  GLUCOSE 107* 101* 141*  BUN '14 16 12  '$ CREATININE 0.70 0.70 0.73  CALCIUM 10.8* 10.5* 9.0   Recent Labs    11/21/21 0412 01/12/22 1446 04/14/22 1450  AST '27 27 21  '$ ALT 41 42 29  ALKPHOS 65 85 89  BILITOT 1.6* 1.2 1.2  PROT 5.9* 7.2 6.9  ALBUMIN 3.6 4.4 4.3   Recent Labs    11/20/21 1446 11/21/21 0412 01/12/22 1446 04/14/22 1450 06/23/22 1350  WBC 5.5   < > 6.8 6.3 7.0  NEUTROABS 3.2  --  4.3 4.0  --   HGB 14.0   < > 15.3* 14.8 15.1*  HCT 42.9   < > 46.2* 43.5 44.7  MCV 101.7*   < > 97.5 98.4 100.0  PLT 188   < > 207 207 171   < > = values in this interval not displayed.   Lab Results  Component Value Date   TSH 1.104 11/21/2021   No results found for: "HGBA1C" Lab Results  Component Value Date   CHOL 178 04/02/2016   HDL 89 04/02/2016   LDLCALC 72 04/02/2016   TRIG 84 04/02/2016   CHOLHDL 2.0 04/02/2016    Significant Diagnostic Results in last 30 days:  DG ESOPHAGUS W SINGLE CM (SOL OR THIN BA)  Result Date: 07/01/2022 CLINICAL DATA:  Patient 1 day postop robotic hiatal hernia and fundoplication. Request for single contrast medium esophagram to evaluate for leak. EXAM: ESOPHAGUS/BARIUM SWALLOW/TABLET STUDY TECHNIQUE: Single  contrast examination was performed using thin liquid barium. This exam was performed by Narda Rutherford, NP, and was supervised and interpreted by Nelson Chimes, MD. FLUOROSCOPY: Radiation Exposure Index (as provided by the fluoroscopic device): 69.10 mGy Kerma COMPARISON:  Esophagram dated 11/26/2021 FINDINGS: Swallowing: Appears normal. No penetration or aspiration seen. Pharynx: Unremarkable. Esophagus: No esophageal mass. Esophageal motility: Age-related esophageal dysmotility. Hiatal Hernia: None. Gastroesophageal reflux: None visualized. Ingested 32m barium tablet: Not given Other: Narrowing at fundoplication presumably due to postoperative swelling with small passage of contrast. IMPRESSION: Age-related esophageal dysmotility. Narrowing at fundoplication presumably due to postoperative swelling with small volume passage of contrast into the stomach. No leak visualized during exam. Read by: SNarda Rutherford AGNP-BC Electronically Signed   By: MNelson ChimesM.D.   On: 07/01/2022 11:11    Assessment/Plan  1. Hiatal hernia S/p repair   2. Chronic Cameron ulcer Recurrent bleeding issues Currently on protonix daily   3. S/P Nissen fundoplication (without gastrostomy tube) procedure In rehab for therapy PT and OT eval tx F/u with surgery as indicated.   4. Essential hypertension Controlled,not on meds.   5. OSA (obstructive sleep apnea) Has mouth piece.   6. Iron deficiency anemia due to chronic blood loss  hx of iron infusions followed by heme/onc Lab Results  Component Value Date   HGB 15.1 (H) 06/23/2022   Lab Results  Component Value Date   IRON 130 04/14/2022   TIBC 333 04/14/2022   FERRITIN 442 (H) 04/14/2022   7. Constipation She is going to try prune juice Will order prn colace if not effective.  Family/ staff Communication: resident   Labs/tests ordered:  NA

## 2022-07-03 ENCOUNTER — Encounter: Payer: Self-pay | Admitting: Adult Health

## 2022-07-03 ENCOUNTER — Ambulatory Visit: Payer: Medicare Other | Admitting: Cardiovascular Disease

## 2022-07-03 ENCOUNTER — Non-Acute Institutional Stay (SKILLED_NURSING_FACILITY): Payer: Medicare Other | Admitting: Adult Health

## 2022-07-03 DIAGNOSIS — Z8711 Personal history of peptic ulcer disease: Secondary | ICD-10-CM

## 2022-07-03 DIAGNOSIS — Z9889 Other specified postprocedural states: Secondary | ICD-10-CM

## 2022-07-03 DIAGNOSIS — D500 Iron deficiency anemia secondary to blood loss (chronic): Secondary | ICD-10-CM | POA: Diagnosis not present

## 2022-07-03 DIAGNOSIS — I480 Paroxysmal atrial fibrillation: Secondary | ICD-10-CM

## 2022-07-03 LAB — CBC WITH DIFFERENTIAL/PLATELET
Abs Immature Granulocytes: 0.03 10*3/uL (ref 0.00–0.07)
Basophils Absolute: 0 10*3/uL (ref 0.0–0.1)
Basophils Relative: 0 %
Eosinophils Absolute: 0.2 10*3/uL (ref 0.0–0.5)
Eosinophils Relative: 3 %
HCT: 40.1 % (ref 36.0–46.0)
Hemoglobin: 12.8 g/dL (ref 12.0–15.0)
Immature Granulocytes: 1 %
Lymphocytes Relative: 20 %
Lymphs Abs: 1.3 10*3/uL (ref 0.7–4.0)
MCH: 33.2 pg (ref 26.0–34.0)
MCHC: 31.9 g/dL (ref 30.0–36.0)
MCV: 103.9 fL — ABNORMAL HIGH (ref 80.0–100.0)
Monocytes Absolute: 0.7 10*3/uL (ref 0.1–1.0)
Monocytes Relative: 11 %
Neutro Abs: 4.2 10*3/uL (ref 1.7–7.7)
Neutrophils Relative %: 65 %
Platelets: 152 10*3/uL (ref 150–400)
RBC: 3.86 MIL/uL — ABNORMAL LOW (ref 3.87–5.11)
RDW: 13.2 % (ref 11.5–15.5)
WBC: 6.4 10*3/uL (ref 4.0–10.5)
nRBC: 0 % (ref 0.0–0.2)

## 2022-07-03 LAB — COMPREHENSIVE METABOLIC PANEL
ALT: 71 U/L — ABNORMAL HIGH (ref 0–44)
AST: 37 U/L (ref 15–41)
Albumin: 3.1 g/dL — ABNORMAL LOW (ref 3.5–5.0)
Alkaline Phosphatase: 61 U/L (ref 38–126)
Anion gap: 5 (ref 5–15)
BUN: 8 mg/dL (ref 8–23)
CO2: 28 mmol/L (ref 22–32)
Calcium: 9.2 mg/dL (ref 8.9–10.3)
Chloride: 107 mmol/L (ref 98–111)
Creatinine, Ser: 0.63 mg/dL (ref 0.44–1.00)
GFR, Estimated: >60 mL/min (ref >60)
Glucose, Bld: 104 mg/dL — ABNORMAL HIGH (ref 70–99)
Potassium: 3.7 mmol/L (ref 3.5–5.1)
Sodium: 140 mmol/L (ref 135–145)
Total Bilirubin: 1.2 mg/dL (ref 0.3–1.2)
Total Protein: 5.2 g/dL — ABNORMAL LOW (ref 6.5–8.1)

## 2022-07-03 LAB — TSH: TSH: 1.741 u[IU]/mL (ref 0.350–4.500)

## 2022-07-03 LAB — MAGNESIUM: Magnesium: 2 mg/dL (ref 1.7–2.4)

## 2022-07-03 LAB — LIPASE, BLOOD: Lipase: 33 U/L (ref 11–51)

## 2022-07-03 MED ORDER — HYDROCODONE-ACETAMINOPHEN 7.5-325 MG/15ML PO SOLN: 10.0000 mL | ORAL | 0 refills | Status: DC | PRN

## 2022-07-03 MED ORDER — METOPROLOL TARTRATE 25 MG PO TABS: 25.0000 mg | ORAL_TABLET | Freq: Two times a day (BID) | ORAL | 0 refills | Status: DC

## 2022-07-03 MED ORDER — SODIUM CHLORIDE 0.9 % IV BOLUS: 500.0000 mL | Freq: Once | INTRAVENOUS | Status: DC

## 2022-07-03 NOTE — Progress Notes (Signed)
Location:   [facility] Room Number: 158 A Place of Service:  SNF [id number]) Provider:  Royal Hawthorn, NP  Donnajean Lopes, MD  Patient Care Team: Donnajean Lopes, MD as PCP - General (Internal Medicine)  Extended Emergency Contact Information Primary Emergency Contact: Chandler,Marilyn Address: [address]          [place], [place] Phone: [phone] Mobile Phone: [phone] Relation: Friend Secondary Emergency Contact: Charlette Caffey [place] Mobile Phone: [phone] Relation: Son  Code Status:  FULL CODE Goals of care: Advanced Directive information    07/03/2022   10:10 AM  Advanced Directives  Does Patient Have a Medical Advance Directive? Yes  Type of Paramedic of [place];Living will  Does patient want to make changes to medical advance directive? No - Patient declined  Copy of [place] in Chart? Yes - validated most recent copy scanned in chart (See row information)     Chief Complaint  Patient presents with   Acute Visit    Irregular heartbeat    HPI:  Pt is a 75 y.o. female seen today for irregular heat beat. She was sent to the ER last evening for palpitations and afib. EKG in the ER showed afib with rate of 96.  She currently does not have any sob or palpitations. She is in skilled rehab recovering from a Nissen fundoplication on 9/89 due to hiatal hernia, GIB, also hx of carmeron ulcer and bleeding AVMs.  TSH 1.741 Mg 2 BMP was ok, CBC Hgb 12.8 MCV 103.9 In the ER she was given lopressor and IVF. Discharged on metoprolol.  Past Medical History:  Diagnosis Date   Allergies    Anemia    iron infusions   Anxiety    Arthritis    Chronic pain of left thumb    Constipation    Deformity of left thumb joint    Z collapse deformity to nondominant left thumb   Depression    Fibroid, uterine    GERD (gastroesophageal  reflux disease)    History of blood transfusion    History of hiatal hernia    Hx of seasonal allergies    Hyperlipemia    Hypertension    hx of off medication now. Pt states it was a very brief period of high blood pressure due a stressful time in 2010   OA (osteoarthritis) of hip    left   Sleep apnea    dentist made mouthipiece    Past Surgical History:  Procedure Laterality Date   BIOPSY  03/30/2019   Procedure: BIOPSY;  Surgeon: Thornton Park, MD;  Location: [facility];  Service: Gastroenterology;;   [place]  08/2015   with abductor pollicis longus tendon transfer and suspensionplasty   COLONOSCOPY WITH PROPOFOL N/A 03/31/2019   Procedure: COLONOSCOPY WITH PROPOFOL;  Surgeon: Thornton Park, MD;  Location: [facility];  Service: Gastroenterology;  Laterality: N/A;   DE QUERVAIN'S RELEASE  08/2015   DILATATION & CURRETTAGE/HYSTEROSCOPY WITH RESECTOCOPE  2007   ESOPHAGOGASTRODUODENOSCOPY N/A 11/30/2015   Procedure: ESOPHAGOGASTRODUODENOSCOPY (EGD);  Surgeon: Richmond Campbell, MD;  Location: [facility];  Service: Endoscopy;  Laterality: N/A;   ESOPHAGOGASTRODUODENOSCOPY (EGD) WITH PROPOFOL N/A 03/30/2019   Procedure: ESOPHAGOGASTRODUODENOSCOPY (EGD) WITH PROPOFOL;  Surgeon: Thornton Park, MD;  Location: [facility];  Service: Gastroenterology;  Laterality: N/A;   ESOPHAGOGASTRODUODENOSCOPY (EGD) WITH PROPOFOL N/A 12/31/2020   Procedure: ESOPHAGOGASTRODUODENOSCOPY (EGD) WITH PROPOFOL;  Surgeon:  Irene Shipper, MD;  Location: [facility];  Service: Endoscopy;  Laterality: N/A;   ESOPHAGOGASTRODUODENOSCOPY (EGD) WITH PROPOFOL N/A 11/04/2021   Procedure: ESOPHAGOGASTRODUODENOSCOPY (EGD) WITH PROPOFOL;  Surgeon: Ronnette Juniper, MD;  Location: [facility];  Service: Gastroenterology;  Laterality: N/A;   HERNIA REPAIR Left 06/2012   INGUINAL HERNIA REPAIR Right 05/03/2013   Procedure: HERNIA REPAIR INGUINAL ADULT;  Surgeon: Harl Bowie, MD;  Location:  [facility];  Service: General;  Laterality: Right;   INGUINAL HERNIA REPAIR Right 08/23/2013   Procedure: LAPAROSCOPIC INGUINAL HERNIA;  Surgeon: Harl Bowie, MD;  Location: [facility];  Service: General;  Laterality: Right;   INSERTION OF MESH Right 05/03/2013   Procedure: INSERTION OF MESH;  Surgeon: Harl Bowie, MD;  Location: [facility];  Service: General;  Laterality: Right;   INSERTION OF MESH Right 08/23/2013   Procedure: INSERTION OF MESH;  Surgeon: Harl Bowie, MD;  Location: [facility];  Service: General;  Laterality: Right;   INSERTION OF MESH N/A 06/30/2022   Procedure: INSERTION OF MESH;  Surgeon: Ralene Ok, MD;  Location: [facility];  Service: General;  Laterality: N/A;   METACARPOPHALANGEAL JOINT ARTHRODESIS  08/2015   with local bone graft   TONSILLECTOMY     TOTAL HIP ARTHROPLASTY  06/24/2012   Procedure: TOTAL HIP ARTHROPLASTY ANTERIOR APPROACH;  Surgeon: Mcarthur Rossetti, MD;  Location: [facility];  Service: Orthopedics;  Laterality: Left;  Left total hip arthroplasty, anterior approach   UTERINE FIBROID SURGERY  2007   WISDOM TOOTH EXTRACTION     XI ROBOTIC ASSISTED HIATAL HERNIA REPAIR N/A 06/30/2022   Procedure: ROBOTIC HIATAL HERNIA AND FUNDOPLICATION;  Surgeon: Ralene Ok, MD;  Location: [facility];  Service: General;  Laterality: N/A;    Allergies  Allergen Reactions   Nsaids     Patient suffers from severe GI bleeds.   Penicillins Swelling and Other (See Comments)    Tongue became swollen and turned black Did it involve swelling of the face/tongue/throat, SOB, or low BP? Yes Did it involve sudden or severe rash/hives, skin peeling, or any reaction on the inside of your mouth or nose? Yes Did you need to seek medical attention at a hospital or doctor's office? Yes When did it last happen? "I was a child"   If all above answers are "NO", may proceed with cephalosporin use.    Other     Bug bites-per patient, any bug that she has been bitten by will cause her  anxiety, dermatitis, hives, itching, nausea and swelling.    Allergies as of 07/03/2022       Reactions   Nsaids    Patient suffers from severe GI bleeds.   Penicillins Swelling, Other (See Comments)   Tongue became swollen and turned black Did it involve swelling of the face/tongue/throat, SOB, or low BP? Yes Did it involve sudden or severe rash/hives, skin peeling, or any reaction on the inside of your mouth or nose? Yes Did you need to seek medical attention at a hospital or doctor's office? Yes When did it last happen? "I was a child"   If all above answers are "NO", may proceed with cephalosporin use.   Other    Bug bites-per patient, any bug that she has been bitten by will cause her anxiety, dermatitis, hives, itching, nausea and swelling.        Medication List        Accurate as of July 03, 2022 10:10 AM. If you have any  questions, ask your nurse or doctor.          Biofreeze 4 % Gel Generic drug: Menthol (Topical Analgesic) Apply 1 Application topically daily.   cetirizine 10 MG tablet Commonly known as: ZYRTEC Take 10 mg by mouth in the morning and at bedtime.   COLLAGEN PO Take 1 packet by mouth daily.   cyanocobalamin 1000 MCG tablet Take 1,000 mcg by mouth daily.   docusate sodium 100 MG capsule Commonly known as: Colace Take 1 capsule (100 mg total) by mouth daily as needed for mild constipation.   HYDROcodone-acetaminophen 7.5-325 mg/15 ml solution Commonly known as: HYCET Take 10 mLs by mouth every 4 (four) hours as needed for moderate pain.   metoprolol tartrate 25 MG tablet Commonly known as: LOPRESSOR Take 1 tablet (25 mg total) by mouth 2 (two) times daily.   Omega-3 1000 MG Caps Take 1,000 mg by mouth daily.   pantoprazole 40 MG tablet Commonly known as: PROTONIX Take 40 mg by mouth daily.   Systane Balance 0.6 % Soln Generic drug: Propylene Glycol Place 1 drop into both eyes as needed (dry eyes).   Systane Overnight Therapy  0.3 % Gel ophthalmic ointment Generic drug: hypromellose Place 1 Application into both eyes at bedtime.   Viibryd 40 MG Tabs Generic drug: Vilazodone HCl Take 40 mg by mouth daily.   vitamin C 1000 MG tablet Take 1,000 mg by mouth daily.   Vitamin D3 50 MCG (2000 UT) capsule Take 4,000 Units by mouth daily.        Review of Systems  Constitutional:  Negative for activity change, appetite change, chills, diaphoresis, fatigue, fever and unexpected weight change.  HENT:  Negative for congestion.   Respiratory:  Negative for cough, shortness of breath and wheezing.   Cardiovascular:  Positive for palpitations (resolved). Negative for chest pain and leg swelling.  Gastrointestinal:  Positive for abdominal pain (due to surgery). Negative for abdominal distention, constipation and diarrhea.  Genitourinary:  Negative for difficulty urinating and dysuria.  Musculoskeletal:  Negative for arthralgias, back pain, gait problem, joint swelling and myalgias.  Neurological:  Negative for dizziness, tremors, seizures, syncope, facial asymmetry, speech difficulty, weakness, light-headedness, numbness and headaches.  Psychiatric/Behavioral:  Negative for agitation, behavioral problems and confusion.     Immunization History  Administered Date(s) Administered   Influenza, High Dose Seasonal PF 01/01/2015, 10/14/2016, 09/13/2017, 08/23/2018, 08/03/2019   Influenza-Unspecified 08/03/2019   Moderna Sars-Covid-2 Vaccination 11/21/2019, 12/20/2019   Zoster Recombinat (Shingrix) 08/03/2019   Pertinent  Health Maintenance Due  Topic Date Due   INFLUENZA VACCINE  06/09/2022   DEXA SCAN  Completed   COLONOSCOPY (Pts 45-47yr Insurance coverage will need to be confirmed)  Discontinued      06/30/2022    8:31 AM 06/30/2022    6:00 PM 06/30/2022    9:00 PM 07/01/2022    8:00 AM 07/02/2022   11:15 PM  Fall Risk  Patient Fall Risk Level High fall risk Moderate fall risk Moderate fall risk Moderate fall  risk Low fall risk   Functional Status Survey:    Vitals:   07/03/22 1002  BP: 137/88  Pulse: 79  Resp: 19  Temp: 98 F (36.7 C)  SpO2: 91%  Height: '5\' 3"'$  (1.6 m)   Body mass index is 34.54 kg/m. Physical Exam Vitals and nursing note reviewed.  Constitutional:      General: She is not in acute distress.    Appearance: She is not diaphoretic.  HENT:  Head: Normocephalic and atraumatic.  Neck:     Vascular: No JVD.  Cardiovascular:     Rate and Rhythm: Normal rate. Rhythm irregular.     Heart sounds: No murmur heard.    Comments: Initially irregular, then became regular. Apical pulse 60 Pulmonary:     Effort: Pulmonary effort is normal. No respiratory distress.     Breath sounds: Normal breath sounds. No wheezing.  Musculoskeletal:     Left lower leg: No edema.  Skin:    General: Skin is warm and dry.  Neurological:     Mental Status: She is alert and oriented to person, place, and time.     Labs reviewed: Recent Labs    06/23/22 1350 07/01/22 0131 07/03/22 0141  NA 140 141 140  K 3.9 4.6 3.7  CL 108 112* 107  CO2 '27 26 28  '$ GLUCOSE 101* 141* 104*  BUN '16 12 8  '$ CREATININE 0.70 0.73 0.63  CALCIUM 10.5* 9.0 9.2  MG  --   --  2.0   Recent Labs    01/12/22 1446 04/14/22 1450 07/03/22 0141  AST 27 21 37  ALT 42 29 71*  ALKPHOS 85 89 61  BILITOT 1.2 1.2 1.2  PROT 7.2 6.9 5.2*  ALBUMIN 4.4 4.3 3.1*   Recent Labs    01/12/22 1446 04/14/22 1450 06/23/22 1350 07/03/22 0141  WBC 6.8 6.3 7.0 6.4  NEUTROABS 4.3 4.0  --  4.2  HGB 15.3* 14.8 15.1* 12.8  HCT 46.2* 43.5 44.7 40.1  MCV 97.5 98.4 100.0 103.9*  PLT 207 207 171 152   Lab Results  Component Value Date   TSH 1.741 07/03/2022   No results found for: "HGBA1C" Lab Results  Component Value Date   CHOL 178 04/02/2016   HDL 89 04/02/2016   LDLCALC 72 04/02/2016   TRIG 84 04/02/2016   CHOLHDL 2.0 04/02/2016    Significant Diagnostic Results in last 30 days:  DG Chest Port 1  View  Result Date: 07/02/2022 CLINICAL DATA:  New atrial fibrillation. Recent laparoscopic surgery this week, hiatal hernia repair. EXAM: PORTABLE CHEST 1 VIEW COMPARISON:  None Available. FINDINGS: Low lung volumes. The heart is normal in size for technique. Aortic atherosclerosis and tortuosity. Streaky opacities at the left lung base likely related to atelectasis. There may be a small amount of left pleural fluid. No pneumothorax. No pulmonary edema. IMPRESSION: 1. Low lung volumes with left basilar atelectasis and possible small left pleural effusion. 2. Aortic atherosclerosis and tortuosity. Electronically Signed   By: Keith Rake M.D.   On: 07/02/2022 23:55   DG ESOPHAGUS W SINGLE CM (SOL OR THIN BA)  Result Date: 07/01/2022 CLINICAL DATA:  Patient 1 day postop robotic hiatal hernia and fundoplication. Request for single contrast medium esophagram to evaluate for leak. EXAM: ESOPHAGUS/BARIUM SWALLOW/TABLET STUDY TECHNIQUE: Single contrast examination was performed using thin liquid barium. This exam was performed by Narda Rutherford, NP, and was supervised and interpreted by Nelson Chimes, MD. FLUOROSCOPY: Radiation Exposure Index (as provided by the fluoroscopic device): 69.10 mGy Kerma COMPARISON:  Esophagram dated 11/26/2021 FINDINGS: Swallowing: Appears normal. No penetration or aspiration seen. Pharynx: Unremarkable. Esophagus: No esophageal mass. Esophageal motility: Age-related esophageal dysmotility. Hiatal Hernia: None. Gastroesophageal reflux: None visualized. Ingested 43m barium tablet: Not given Other: Narrowing at fundoplication presumably due to postoperative swelling with small passage of contrast. IMPRESSION: Age-related esophageal dysmotility. Narrowing at fundoplication presumably due to postoperative swelling with small volume passage of contrast into the stomach. No leak  visualized during exam. Read by: Narda Rutherford, AGNP-BC Electronically Signed   By: Nelson Chimes M.D.   On: 07/01/2022  11:11    Assessment/Plan  1. Paroxysmal A-fib (HCC) Irregular at first on exam but then became regular Rate in the 60s and denies symptoms.  Not on anticoagulation at this time due to hx of GIB Going to see cardiology next week for evaluation Continue metoprolol bid monitor bp and pulse   2. S/P Nissen fundoplication (without gastrostomy tube) procedure On PPI Staring PT and OT F/U with surgery  3. History of gastric ulcer Cameron ulcers chronic On PPI  4. Iron deficiency anemia due to chronic blood loss Receives periodic iron transfusion Followed by heme/onc   Family/ staff Communication: discussed with resident and her daughter.   Labs/tests ordered:  NA

## 2022-07-03 NOTE — ED Notes (Signed)
Pt ambulate to and from bathroom with c/o pain. Pt back in bed, hooked up to monitor, with call bell in reach. Nurse notified pt would like to have pain medicine.

## 2022-07-03 NOTE — Discharge Instructions (Signed)
Follow-up with your cardiologist in the office.  Take the medication I prescribed to keep your heart rate slower.  Your cardiologist needs to try and figure out what the next best step is if it is to start you on anticoagulation and cardioverted to within the might do that if they think you are too high risk to be anticoagulated they may try a different procedure.

## 2022-07-05 ENCOUNTER — Other Ambulatory Visit: Payer: Self-pay | Admitting: Adult Health

## 2022-07-05 MED ORDER — HYDROCODONE-ACETAMINOPHEN 5-325 MG PO TABS: 1.5000 | ORAL_TABLET | ORAL | 0 refills | Status: AC | PRN

## 2022-07-05 NOTE — Progress Notes (Addendum)
Office Visit    Patient Name: Molly Lucas Date of Encounter: 07/06/2022  Primary Care Provider:  Donnajean Lopes, MD Primary Cardiologist:  Shelva Majestic, MD  Chief Complaint    76 year old female with a history of paroxysmal atrial fibrillation, diastolic dysfunction, hypertension, hyperlipidemia, OSA, iron deficiency anemia, GI bleed, hiatal hernia, anxiety and depression who presents for hospital follow-up related to new onset atrial fibrillation.   Past Medical History    Past Medical History:  Diagnosis Date   Allergies    Anemia    iron infusions   Anxiety    Arthritis    Chronic pain of left thumb    Constipation    Deformity of left thumb joint    Z collapse deformity to nondominant left thumb   Depression    Fibroid, uterine    GERD (gastroesophageal reflux disease)    History of blood transfusion    History of hiatal hernia    Hx of seasonal allergies    Hyperlipemia    Hypertension    hx of off medication now. Pt states it was a very brief period of high blood pressure due a stressful time in 2010   OA (osteoarthritis) of hip    left   Sleep apnea    dentist made mouthipiece    Past Surgical History:  Procedure Laterality Date   BIOPSY  03/30/2019   Procedure: BIOPSY;  Surgeon: Thornton Park, MD;  Location: [place];  Service: Gastroenterology;;   [place]  08/2015   with abductor pollicis longus tendon transfer and suspensionplasty   COLONOSCOPY WITH PROPOFOL N/A 03/31/2019   Procedure: COLONOSCOPY WITH PROPOFOL;  Surgeon: Thornton Park, MD;  Location: [place];  Service: Gastroenterology;  Laterality: N/A;   DE QUERVAIN'S RELEASE  08/2015   DILATATION & CURRETTAGE/HYSTEROSCOPY WITH RESECTOCOPE  2007   ESOPHAGOGASTRODUODENOSCOPY N/A 11/30/2015   Procedure: ESOPHAGOGASTRODUODENOSCOPY (EGD);  Surgeon: Richmond Campbell, MD;  Location: [facility];  Service: Endoscopy;  Laterality: N/A;    ESOPHAGOGASTRODUODENOSCOPY (EGD) WITH PROPOFOL N/A 03/30/2019   Procedure: ESOPHAGOGASTRODUODENOSCOPY (EGD) WITH PROPOFOL;  Surgeon: Thornton Park, MD;  Location: [place];  Service: Gastroenterology;  Laterality: N/A;   ESOPHAGOGASTRODUODENOSCOPY (EGD) WITH PROPOFOL N/A 12/31/2020   Procedure: ESOPHAGOGASTRODUODENOSCOPY (EGD) WITH PROPOFOL;  Surgeon: Irene Shipper, MD;  Location: [facility];  Service: Endoscopy;  Laterality: N/A;   ESOPHAGOGASTRODUODENOSCOPY (EGD) WITH PROPOFOL N/A 11/04/2021   Procedure: ESOPHAGOGASTRODUODENOSCOPY (EGD) WITH PROPOFOL;  Surgeon: Ronnette Juniper, MD;  Location: [facility];  Service: Gastroenterology;  Laterality: N/A;   HERNIA REPAIR Left 06/2012   INGUINAL HERNIA REPAIR Right 05/03/2013   Procedure: HERNIA REPAIR INGUINAL ADULT;  Surgeon: Harl Bowie, MD;  Location: [place];  Service: General;  Laterality: Right;   INGUINAL HERNIA REPAIR Right 08/23/2013   Procedure: LAPAROSCOPIC INGUINAL HERNIA;  Surgeon: Harl Bowie, MD;  Location: [place];  Service: General;  Laterality: Right;   INSERTION OF MESH Right 05/03/2013   Procedure: INSERTION OF MESH;  Surgeon: Harl Bowie, MD;  Location: [place];  Service: General;  Laterality: Right;   INSERTION OF MESH Right 08/23/2013   Procedure: INSERTION OF MESH;  Surgeon: Harl Bowie, MD;  Location: [place];  Service: General;  Laterality: Right;   INSERTION OF MESH N/A 06/30/2022   Procedure: INSERTION OF MESH;  Surgeon: Ralene Ok, MD;  Location: [place];  Service: General;  Laterality: N/A;   METACARPOPHALANGEAL JOINT ARTHRODESIS  08/2015   with local bone graft   TONSILLECTOMY  TOTAL HIP ARTHROPLASTY  06/24/2012   Procedure: TOTAL HIP ARTHROPLASTY ANTERIOR APPROACH;  Surgeon: Mcarthur Rossetti, MD;  Location: [facility];  Service: Orthopedics;  Laterality: Left;  Left total hip arthroplasty, anterior approach   UTERINE FIBROID SURGERY  2007   WISDOM TOOTH EXTRACTION     XI ROBOTIC  ASSISTED HIATAL HERNIA REPAIR N/A 06/30/2022   Procedure: ROBOTIC HIATAL HERNIA AND FUNDOPLICATION;  Surgeon: Ralene Ok, MD;  Location: [place];  Service: General;  Laterality: N/A;    Allergies  Allergies  Allergen Reactions   Nsaids     Patient suffers from severe GI bleeds.   Penicillins Swelling and Other (See Comments)    Tongue became swollen and turned black Did it involve swelling of the face/tongue/throat, SOB, or low BP? Yes Did it involve sudden or severe rash/hives, skin peeling, or any reaction on the inside of your mouth or nose? Yes Did you need to seek medical attention at a hospital or doctor's office? Yes When did it last happen? "I was a child"   If all above answers are "NO", may proceed with cephalosporin use.    Other     Bug bites-per patient, any bug that she has been bitten by will cause her anxiety, dermatitis, hives, itching, nausea and swelling.    History of Present Illness    76 year old female with the above past medical history including paroxysmal atrial fibrillation, diastolic dysfunction, hypertension, hyperlipidemia, OSA, iron deficiency anemia (follows with hematology), GI bleed,  hiatal hernia, anxiety and depression.  She was initially referred to Dr. Claiborne Billings in 2017 for the evaluation of shortness of breath.  Echocardiogram at the time showed EF to 60%, G2 DD, moderate mitral valve regurgitation, mild left atrial dilation, and mild dilation of aortic root.  Myoview in May 2017 was low risk, no evidence of ischemia.  History of anemia and follows with hematology/oncology.  Additionally, she has a history of OSA. She was last seen in the office on 03/13/2021 and was stable from a cardiac standpoint.  Repeat echocardiogram in 04/2021 of 60 to 65%, indeterminate diastolic parameters, normal RV systolic function, no evidence of mitral valve regurgitation, mild aortic valve regurgitation.  She underwent Nissen fundoplication (with g-tube placement) for  hiatal hernia on 06/30/2022.  She presented to the ED on 07/02/2022 with complaints palpitations, new onset atrial fibrillation noted at her ALF.  Labs including CMET, TSH and Mg were unremarkable. Anticoagulation was not initiated given history of anemia with severe GI bleed.  She was therefore not considered a candidate for DCCV.  She was discharged home in stable condition.  She presents today for follow-up accompanied by her son.  Since her last visit and since her recent ED visit she has been stable from a cardiac standpoint.  She denies any recurrent palpitations, denies dizziness, presyncope, syncope.  She denies symptoms concerning for angina.  She does note some mild dyspnea on exertion, however, she thinks this is in the setting of overall physical deconditioning and recent surgery.  She has stable chronic nonpitting bilateral lower extremity edema.  Otherwise, she reports feeling well and denies any additional concerns today.  Home Medications    Current Outpatient Medications  Medication Sig Dispense Refill   Ascorbic Acid (VITAMIN C) 1000 MG tablet Take 1,000 mg by mouth daily.     cetirizine (ZYRTEC) 10 MG tablet Take 10 mg by mouth in the morning and at bedtime.     Cholecalciferol (VITAMIN D3) 50 MCG (2000 UT) capsule Take  4,000 Units by mouth daily.     COLLAGEN PO Take 1 packet by mouth daily.     cyanocobalamin 1000 MCG tablet Take 1,000 mcg by mouth daily.     docusate sodium (COLACE) 100 MG capsule Take 1 capsule (100 mg total) by mouth daily as needed for mild constipation. 30 capsule 0   Doxepin HCl 6 MG TABS Take 6 mg by mouth every evening.     DULoxetine (CYMBALTA) 60 MG capsule Take 60 mg by mouth daily.     fluticasone (FLONASE) 50 MCG/ACT nasal spray Place 2 sprays into both nostrils daily.     HYDROcodone-acetaminophen (NORCO) 5-325 MG tablet Take 1.5 tablets by mouth every 4 (four) hours as needed for moderate pain. 30 tablet 0   hypromellose (SYSTANE OVERNIGHT THERAPY)  0.3 % GEL ophthalmic ointment Place 1 Application into both eyes at bedtime.     Menthol, Topical Analgesic, (BIOFREEZE) 4 % GEL Apply 1 Application topically daily.     metoprolol tartrate (LOPRESSOR) 25 MG tablet Take 1 tablet (25 mg total) by mouth 2 (two) times daily. (Patient taking differently: Take 25 mg by mouth 2 (two) times daily. Hold for heart rate less than 70 and SPV less than 95) 60 tablet 0   Omega-3 1000 MG CAPS Take 1,000 mg by mouth daily.     pantoprazole (PROTONIX) 40 MG tablet Take 40 mg by mouth daily.     Propylene Glycol (SYSTANE BALANCE) 0.6 % SOLN Place 1 drop into both eyes as needed (dry eyes).     Turmeric (QC TUMERIC COMPLEX) 500 MG CAPS Take by mouth.     VIIBRYD 40 MG TABS Take 40 mg by mouth daily.     No current facility-administered medications for this visit.     Review of Systems    She denies chest pain, palpitations, dyspnea, pnd, orthopnea, n, v, dizziness, syncope, edema, weight gain, or early satiety. All other systems reviewed and are otherwise negative except as noted above.   Physical Exam    VS:  BP 138/88   Pulse 74   Ht '5\' 3"'$  (1.6 m)   Wt 213 lb (96.6 kg)   SpO2 90%   BMI 37.73 kg/m   GEN: Well nourished, well developed, in no acute distress. HEENT: normal. Neck: Supple, no JVD, carotid bruits, or masses. Cardiac: RRR, no murmurs, rubs, or gallops. No clubbing, cyanosis, nonpitting bilateral lower extremity intermittent edema.  Radials/DP/PT 2+ and equal bilaterally.  Respiratory:  Respirations regular and unlabored, clear to auscultation bilaterally. GI: Soft, nontender, nondistended, BS + x 4. MS: no deformity or atrophy. Skin: warm and dry, no rash. Neuro:  Strength and sensation are intact. Psych: Normal affect.  Accessory Clinical Findings    ECG personally reviewed by me today - NSR, 74 bpm - no acute changes.   Lab Results  Component Value Date   WBC 6.4 07/03/2022   HGB 12.8 07/03/2022   HCT 40.1 07/03/2022   MCV  103.9 (H) 07/03/2022   PLT 152 07/03/2022   Lab Results  Component Value Date   CREATININE 0.63 07/03/2022   BUN 8 07/03/2022   NA 140 07/03/2022   K 3.7 07/03/2022   CL 107 07/03/2022   CO2 28 07/03/2022   Lab Results  Component Value Date   ALT 71 (H) 07/03/2022   AST 37 07/03/2022   ALKPHOS 61 07/03/2022   BILITOT 1.2 07/03/2022   Lab Results  Component Value Date   CHOL 178 04/02/2016  HDL 89 04/02/2016   LDLCALC 72 04/02/2016   TRIG 84 04/02/2016   CHOLHDL 2.0 04/02/2016    No results found for: "HGBA1C"  Assessment & Plan    1. Paroxysmal atrial fibrillation: Recently diagnosed s/p Nissen fundoplication.  EKG today shows NSR, 74 bpm.  Will check 14-day Zio patch to assess A-fib burden.  Not on anticoagulation given history of anemia with severe GI bleed.  CHA2DS2VASc = 4.  Discussed risks versus benefits of anticoagulation and bleeding in the setting of annual stroke risk.  Patient agrees to defer anticoagulation at this time.  Discussed ED precautions, self monitoring with cardia mobile device.  Continue metoprolol.  Hold metoprolol for HR < 60 bpm, SBP < 95.   2.  H/o diastolic dysfunction:  Most recent echo in 04/2021 of 60 to 65%, indeterminate diastolic parameters, normal RV systolic function, no evidence of mitral valve regurgitation, mild aortic valve regurgitation.  She has stable chronic nonpitting bilateral lower extremity edema, largely dependent.  Otherwise, euvolemic and well compensated on exam.  No indication for diuretic at this time.  Continue metoprolol as above.  3. Hypertension: BP well controlled. Continue current antihypertensive regimen.   4. Hyperlipidemia: LDL was 114 in 01/2022.  Not on statin therapy. Monitored and managed per PCP.  5. OSA: Adherent to CPAP.   6. Iron deficiency anemia: Denies any recent bleeding.  Follows with hematology.  7. Hiatal hernia: S/p Nissen fundoplication.  Following with general surgery.  8. Disposition:  Follow-up in 6-8 weeks.       Lenna Sciara, NP 07/06/2022, 9:54 AM

## 2022-07-06 ENCOUNTER — Encounter: Payer: Self-pay | Admitting: Hematology

## 2022-07-06 ENCOUNTER — Ambulatory Visit: Payer: Medicare Other | Attending: Cardiovascular Disease | Admitting: Nurse Practitioner

## 2022-07-06 ENCOUNTER — Non-Acute Institutional Stay (SKILLED_NURSING_FACILITY): Payer: Medicare Other | Admitting: Internal Medicine

## 2022-07-06 ENCOUNTER — Encounter: Payer: Self-pay | Admitting: Nurse Practitioner

## 2022-07-06 ENCOUNTER — Ambulatory Visit: Payer: Medicare Other | Attending: Nurse Practitioner

## 2022-07-06 VITALS — BP 138/88 | HR 74 | Ht 63.0 in | Wt 213.0 lb

## 2022-07-06 DIAGNOSIS — F3342 Major depressive disorder, recurrent, in full remission: Secondary | ICD-10-CM

## 2022-07-06 DIAGNOSIS — D500 Iron deficiency anemia secondary to blood loss (chronic): Secondary | ICD-10-CM

## 2022-07-06 DIAGNOSIS — I10 Essential (primary) hypertension: Secondary | ICD-10-CM | POA: Diagnosis not present

## 2022-07-06 DIAGNOSIS — K257 Chronic gastric ulcer without hemorrhage or perforation: Secondary | ICD-10-CM

## 2022-07-06 DIAGNOSIS — I5189 Other ill-defined heart diseases: Secondary | ICD-10-CM | POA: Diagnosis not present

## 2022-07-06 DIAGNOSIS — I480 Paroxysmal atrial fibrillation: Secondary | ICD-10-CM

## 2022-07-06 DIAGNOSIS — K449 Diaphragmatic hernia without obstruction or gangrene: Secondary | ICD-10-CM

## 2022-07-06 DIAGNOSIS — Z9889 Other specified postprocedural states: Secondary | ICD-10-CM | POA: Diagnosis not present

## 2022-07-06 DIAGNOSIS — E782 Mixed hyperlipidemia: Secondary | ICD-10-CM | POA: Insufficient documentation

## 2022-07-06 DIAGNOSIS — G4733 Obstructive sleep apnea (adult) (pediatric): Secondary | ICD-10-CM | POA: Insufficient documentation

## 2022-07-06 DIAGNOSIS — F5101 Primary insomnia: Secondary | ICD-10-CM

## 2022-07-06 NOTE — Progress Notes (Unsigned)
Enrolled for [employer] to mail a ZIO XT long term holter monitor to the patients address on file.   Dr. Claiborne Billings to read.

## 2022-07-06 NOTE — Addendum Note (Signed)
Addended by: Derrick Ravel on: 0/21/1155 20:80 AM   Modules accepted: Orders

## 2022-07-06 NOTE — Patient Instructions (Signed)
Medication Instructions:  Hold Metoprolol for heart rate less than 60 pr SPV less than 95.  *If you need a refill on your cardiac medications before your next appointment, please call your pharmacy*   Lab Work: NONE ordered at this time of appointment   If you have labs (blood work) drawn today and your tests are completely normal, you will receive your results only by: [facility] (if you have MyChart) OR A paper copy in the mail If you have any lab test that is abnormal or we need to change your treatment, we will call you to review the results.   Testing/Procedures: [place] [place]- Long Term Monitor Instructions  Your physician has requested you wear a ZIO patch monitor for 14 days.  This is a single patch monitor. [employer] supplies one patch monitor per enrollment. Additional stickers are not available. Please do not apply patch if you will be having a Nuclear Stress Test,  Echocardiogram, Cardiac CT, MRI, or Chest Xray during the period you would be wearing the  monitor. The patch cannot be worn during these tests. You cannot remove and re-apply the  ZIO XT patch monitor.  Your ZIO patch monitor will be mailed 3 day USPS to your address on file. It may take 3-5 days  to receive your monitor after you have been enrolled.  Once you have received your monitor, please review the enclosed instructions. Your monitor  has already been registered assigning a specific monitor serial # to you.  Billing and Patient Assistance Program Information  We have supplied [employer] with any of your insurance information on file for billing purposes. [employer] offers a sliding scale Patient Assistance Program for patients that do not have  insurance, or whose insurance does not completely cover the cost of the ZIO monitor.  You must apply for the Patient Assistance Program to qualify for this discounted rate.  To apply, please call [employer] at [phone], select option 4, select option 2, ask to apply for   Patient Assistance Program. Molly Lucas will ask your household income, and how many people  are in your household. They will quote your out-of-pocket cost based on that information.  [employer] will also be able to set up a 34-month interest-free payment plan if needed.  Applying the monitor   Shave hair from upper left chest.  Hold abrader disc by orange tab. Rub abrader in 40 strokes over the upper left chest as  indicated in your monitor instructions.  Clean area with 4 enclosed alcohol pads. Let dry.  Apply patch as indicated in monitor instructions. Patch will be placed under collarbone on left  side of chest with arrow pointing upward.  Rub patch adhesive wings for 2 minutes. Remove white label marked "1". Remove the white  label marked "2". Rub patch adhesive wings for 2 additional minutes.  While looking in a mirror, press and release button in center of patch. A small green light will  flash 3-4 times. This will be your only indicator that the monitor has been turned on.  Do not shower for the first 24 hours. You may shower after the first 24 hours.  Press the button if you feel a symptom. You will hear a small click. Record Date, Time and  Symptom in the Patient Logbook.  When you are ready to remove the patch, follow instructions on the last 2 pages of Patient  Logbook. Stick patch monitor onto the last page of Patient Logbook.  Place Patient Logbook in the blue  and white box. Use locking tab on box and tape box closed  securely. The blue and white box has prepaid postage on it. Please place it in the mailbox as  soon as possible. Your physician should have your test results approximately 7 days after the  monitor has been mailed back to [facility].  Call [facility] at [phone] if you have questions regarding  your ZIO XT patch monitor. Call them immediately if you see an orange light blinking on your  monitor.  If your monitor falls off in less than 4  days, contact our Monitor department at [phone].  If your monitor becomes loose or falls off after 4 days call [employer] at [phone] for  suggestions on securing your monitor    Follow-Up: At [facility], you and your health needs are our priority.  As part of our continuing mission to provide you with exceptional heart care, we have created designated Provider Care Teams.  These Care Teams include your primary Cardiologist (physician) and Advanced Practice Providers (APPs -  Physician Assistants and Nurse Practitioners) who all work together to provide you with the care you need, when you need it.  We recommend signing up for the patient portal called "MyChart".  Sign up information is provided on this After Visit Summary.  MyChart is used to connect with patients for Virtual Visits (Telemedicine).  Patients are able to view lab/test results, encounter notes, upcoming appointments, etc.  Non-urgent messages can be sent to your provider as well.   To learn more about what you can do with MyChart, go to [email or web address].    Your next appointment:   6-8 week(s)  The format for your next appointment:   In Person  Provider:   Diona Browner, NP        Other Instructions KardiaMobile device discussed at appointment.   Important Information About Sugar

## 2022-07-06 NOTE — Progress Notes (Signed)
Provider:   Location:  Occupational psychologist of Service:  SNF (31)  PCP: Donnajean Lopes, MD Patient Care Team: Donnajean Lopes, MD as PCP - General (Internal Medicine) Troy Sine, MD as PCP - Cardiology (Cardiology)  Extended Emergency Contact Information Primary Emergency Contact: Chandler,Marilyn Address: [place], [place] Phone: [phone] Mobile Phone: [phone] Relation: Friend Secondary Emergency Contact: Charlette Caffey [place] Mobile Phone: [phone] Relation: Son  Code Status: Full Code Goals of Care: Advanced Directive information    07/03/2022   10:10 AM  Advanced Directives  Does Patient Have a Medical Advance Directive? Yes  Type of Paramedic of [place];Living will  Does patient want to make changes to medical advance directive? No - Patient declined  Copy of [place] in Chart? Yes - validated most recent copy scanned in chart (See row information)      Chief Complaint  Patient presents with   New Admit To SNF    HPI: Patient is a 76 y.o. female seen today for admission to SNF for Therapy  Admitted from 08/22 -08/23 for Nissen Fundoplication   Has h/o Iron Def anemia on Chronic iron Infusions Patient has a history of chronic GI bleed from [place] erosions. EGD showed 10 cm hiatal hernia.  Seen by Dr. Rosendo Gros for elective Nissen fundoplication.  Underwent procedure on 0 822 Came to rehab postprocedure.  Was found to have irregular heart rate.  Had a EKG done which showed new onset A-fib was sent to ED where she got a dose of metoprolol and converted into sinus rhythm and has been in sinus rhythm since then.  Was seen by cardiology which recommended staying on metoprolol.  They also said to do Zio patch for 2 weeks. Patient is doing well did not have any acute complaints no dizziness no shortness of  breath no palpitations walking with her walker wants to know when she can go home  Nurses did tell me that they had to hold her metoprolol sometimes due to bradycardia  Past Medical History:  Diagnosis Date   Allergies    Anemia    iron infusions   Anxiety    Arthritis    Chronic pain of left thumb    Constipation    Deformity of left thumb joint    Z collapse deformity to nondominant left thumb   Depression    Fibroid, uterine    GERD (gastroesophageal reflux disease)    History of blood transfusion    History of hiatal hernia    Hx of seasonal allergies    Hyperlipemia    Hypertension    hx of off medication now. Pt states it was a very brief period of high blood pressure due a stressful time in 2010   OA (osteoarthritis) of hip    left   Sleep apnea    dentist made mouthipiece    Past Surgical History:  Procedure Laterality Date   BIOPSY  03/30/2019   Procedure: BIOPSY;  Surgeon: Thornton Park, MD;  Location: [facility];  Service: Gastroenterology;;   [place]  08/2015   with abductor pollicis longus tendon transfer and suspensionplasty   COLONOSCOPY WITH PROPOFOL N/A 03/31/2019   Procedure: COLONOSCOPY WITH PROPOFOL;  Surgeon: Thornton Park, MD;  Location: [facility];  Service: Gastroenterology;  Laterality: N/A;   DE QUERVAIN'S RELEASE  08/2015   DILATATION & CURRETTAGE/HYSTEROSCOPY WITH RESECTOCOPE  2007   ESOPHAGOGASTRODUODENOSCOPY N/A 11/30/2015   Procedure: ESOPHAGOGASTRODUODENOSCOPY (EGD);  Surgeon: Richmond Campbell, MD;  Location: [facility];  Service: Endoscopy;  Laterality: N/A;   ESOPHAGOGASTRODUODENOSCOPY (EGD) WITH PROPOFOL N/A 03/30/2019   Procedure: ESOPHAGOGASTRODUODENOSCOPY (EGD) WITH PROPOFOL;  Surgeon: Thornton Park, MD;  Location: [facility];  Service: Gastroenterology;  Laterality: N/A;   ESOPHAGOGASTRODUODENOSCOPY (EGD) WITH PROPOFOL N/A 12/31/2020   Procedure: ESOPHAGOGASTRODUODENOSCOPY (EGD) WITH PROPOFOL;   Surgeon: Irene Shipper, MD;  Location: [facility];  Service: Endoscopy;  Laterality: N/A;   ESOPHAGOGASTRODUODENOSCOPY (EGD) WITH PROPOFOL N/A 11/04/2021   Procedure: ESOPHAGOGASTRODUODENOSCOPY (EGD) WITH PROPOFOL;  Surgeon: Ronnette Juniper, MD;  Location: [facility];  Service: Gastroenterology;  Laterality: N/A;   HERNIA REPAIR Left 06/2012   INGUINAL HERNIA REPAIR Right 05/03/2013   Procedure: HERNIA REPAIR INGUINAL ADULT;  Surgeon: Harl Bowie, MD;  Location: [facility];  Service: General;  Laterality: Right;   INGUINAL HERNIA REPAIR Right 08/23/2013   Procedure: LAPAROSCOPIC INGUINAL HERNIA;  Surgeon: Harl Bowie, MD;  Location: [facility];  Service: General;  Laterality: Right;   INSERTION OF MESH Right 05/03/2013   Procedure: INSERTION OF MESH;  Surgeon: Harl Bowie, MD;  Location: [facility];  Service: General;  Laterality: Right;   INSERTION OF MESH Right 08/23/2013   Procedure: INSERTION OF MESH;  Surgeon: Harl Bowie, MD;  Location: [facility];  Service: General;  Laterality: Right;   INSERTION OF MESH N/A 06/30/2022   Procedure: INSERTION OF MESH;  Surgeon: Ralene Ok, MD;  Location: [facility];  Service: General;  Laterality: N/A;   METACARPOPHALANGEAL JOINT ARTHRODESIS  08/2015   with local bone graft   TONSILLECTOMY     TOTAL HIP ARTHROPLASTY  06/24/2012   Procedure: TOTAL HIP ARTHROPLASTY ANTERIOR APPROACH;  Surgeon: Mcarthur Rossetti, MD;  Location: [facility];  Service: Orthopedics;  Laterality: Left;  Left total hip arthroplasty, anterior approach   UTERINE FIBROID SURGERY  2007   WISDOM TOOTH EXTRACTION     XI ROBOTIC ASSISTED HIATAL HERNIA REPAIR N/A 06/30/2022   Procedure: ROBOTIC HIATAL HERNIA AND FUNDOPLICATION;  Surgeon: Ralene Ok, MD;  Location: [facility];  Service: General;  Laterality: N/A;    reports that she has never smoked. She has never used smokeless tobacco. She reports current alcohol use of about 2.0 standard drinks of alcohol per week. She reports  that she does not use drugs. Social History   Socioeconomic History   Marital status: Divorced    Spouse name: Not on file   Number of children: Not on file   Years of education: Not on file   Highest education level: Not on file  Occupational History   Not on file  Tobacco Use   Smoking status: Never   Smokeless tobacco: Never  Vaping Use   Vaping Use: Never used  Substance and Sexual Activity   Alcohol use: Yes    Alcohol/week: 2.0 standard drinks of alcohol    Types: 2 Glasses of wine per week    Comment: occasional glass of wine    Drug use: No   Sexual activity: Not on file  Other Topics Concern   Not on file  Social History Narrative   Not on file   Social Determinants of Health   Financial Resource Strain: Not on file  Food Insecurity: Not on file  Transportation Needs: Not on file  Physical Activity: Not on file  Stress: Not on file  Social Connections:  Not on file  Intimate Partner Violence: Not on file    Functional Status Survey:    Family History  Problem Relation Age of Onset   Heart failure Mother    Osteoporosis Mother    Arthritis Mother    CAD Maternal Grandfather    Heart failure Maternal Grandfather    Dementia Maternal Grandfather    CAD Paternal Grandmother    CAD Paternal Grandfather    Arthritis Sister     Health Maintenance  Topic Date Due   Hepatitis C Screening  Never done   TETANUS/TDAP  Never done   Pneumonia Vaccine 41+ Years old (1 - PCV) Never done   Zoster Vaccines- Shingrix (2 of 2) 09/28/2019   COVID-19 Vaccine (3 - Moderna series) 02/14/2020   INFLUENZA VACCINE  06/09/2022   DEXA SCAN  Completed   HPV VACCINES  Aged Out   COLONOSCOPY (Pts 45-98yr Insurance coverage will need to be confirmed)  Discontinued    Allergies  Allergen Reactions   Nsaids     Patient suffers from severe GI bleeds.   Penicillins Swelling and Other (See Comments)    Tongue became swollen and turned black Did it involve swelling of the  face/tongue/throat, SOB, or low BP? Yes Did it involve sudden or severe rash/hives, skin peeling, or any reaction on the inside of your mouth or nose? Yes Did you need to seek medical attention at a hospital or doctor's office? Yes When did it last happen? "I was a child"   If all above answers are "NO", may proceed with cephalosporin use.    Other     Bug bites-per patient, any bug that she has been bitten by will cause her anxiety, dermatitis, hives, itching, nausea and swelling.    Outpatient Encounter Medications as of 07/06/2022  Medication Sig   Ascorbic Acid (VITAMIN C) 1000 MG tablet Take 1,000 mg by mouth daily.   cetirizine (ZYRTEC) 10 MG tablet Take 10 mg by mouth in the morning and at bedtime.   Cholecalciferol (VITAMIN D3) 50 MCG (2000 UT) capsule Take 4,000 Units by mouth daily.   COLLAGEN PO Take 1 packet by mouth daily.   cyanocobalamin 1000 MCG tablet Take 1,000 mcg by mouth daily.   docusate sodium (COLACE) 100 MG capsule Take 1 capsule (100 mg total) by mouth daily as needed for mild constipation.   Doxepin HCl 6 MG TABS Take 6 mg by mouth every evening.   DULoxetine (CYMBALTA) 60 MG capsule Take 60 mg by mouth daily.   fluticasone (FLONASE) 50 MCG/ACT nasal spray Place 2 sprays into both nostrils daily.   HYDROcodone-acetaminophen (NORCO) 5-325 MG tablet Take 1.5 tablets by mouth every 4 (four) hours as needed for moderate pain.   hypromellose (SYSTANE OVERNIGHT THERAPY) 0.3 % GEL ophthalmic ointment Place 1 Application into both eyes at bedtime.   Menthol, Topical Analgesic, (BIOFREEZE) 4 % GEL Apply 1 Application topically daily.   metoprolol tartrate (LOPRESSOR) 25 MG tablet Take 1 tablet (25 mg total) by mouth 2 (two) times daily. (Patient taking differently: Take 25 mg by mouth 2 (two) times daily. Hold for heart rate less than 70 and SPV less than 95)   Omega-3 1000 MG CAPS Take 1,000 mg by mouth daily.   pantoprazole (PROTONIX) 40 MG tablet Take 40 mg by mouth  daily.   Propylene Glycol (SYSTANE BALANCE) 0.6 % SOLN Place 1 drop into both eyes as needed (dry eyes).   Turmeric (QC TUMERIC COMPLEX) 500 MG CAPS  Take by mouth.   VIIBRYD 40 MG TABS Take 40 mg by mouth daily.   No facility-administered encounter medications on file as of 07/06/2022.    Review of Systems  Constitutional:  Negative for activity change and appetite change.  HENT: Negative.    Respiratory:  Negative for cough and shortness of breath.   Cardiovascular:  Negative for leg swelling.  Gastrointestinal:  Negative for constipation.  Genitourinary: Negative.   Musculoskeletal:  Negative for arthralgias, gait problem and myalgias.  Skin: Negative.   Neurological:  Negative for dizziness and weakness.  Psychiatric/Behavioral:  Negative for confusion, dysphoric mood and sleep disturbance.     There were no vitals filed for this visit. There is no height or weight on file to calculate BMI. Physical Exam Vitals reviewed.  Constitutional:      Appearance: Normal appearance.  HENT:     Head: Normocephalic.     Nose: Nose normal.     Mouth/Throat:     Mouth: Mucous membranes are moist.     Pharynx: Oropharynx is clear.  Eyes:     Pupils: Pupils are equal, round, and reactive to light.  Cardiovascular:     Rate and Rhythm: Normal rate and regular rhythm.     Pulses: Normal pulses.     Heart sounds: Normal heart sounds. No murmur heard. Pulmonary:     Effort: Pulmonary effort is normal.     Breath sounds: Normal breath sounds.  Abdominal:     General: Abdomen is flat. Bowel sounds are normal.     Palpations: Abdomen is soft.     Comments: Bruising due to Surgery  Musculoskeletal:        General: Swelling present.     Cervical back: Neck supple.  Skin:    General: Skin is warm.  Neurological:     General: No focal deficit present.     Mental Status: She is alert and oriented to person, place, and time.  Psychiatric:        Mood and Affect: Mood normal.         Thought Content: Thought content normal.    Labs reviewed: Basic Metabolic Panel: Recent Labs    06/23/22 1350 07/01/22 0131 07/03/22 0141  NA 140 141 140  K 3.9 4.6 3.7  CL 108 112* 107  CO2 '27 26 28  '$ GLUCOSE 101* 141* 104*  BUN '16 12 8  '$ CREATININE 0.70 0.73 0.63  CALCIUM 10.5* 9.0 9.2  MG  --   --  2.0   Liver Function Tests: Recent Labs    01/12/22 1446 04/14/22 1450 07/03/22 0141  AST 27 21 37  ALT 42 29 71*  ALKPHOS 85 89 61  BILITOT 1.2 1.2 1.2  PROT 7.2 6.9 5.2*  ALBUMIN 4.4 4.3 3.1*   Recent Labs    07/03/22 0141  LIPASE 33   No results for input(s): "AMMONIA" in the last 8760 hours. CBC: Recent Labs    01/12/22 1446 04/14/22 1450 06/23/22 1350 07/03/22 0141  WBC 6.8 6.3 7.0 6.4  NEUTROABS 4.3 4.0  --  4.2  HGB 15.3* 14.8 15.1* 12.8  HCT 46.2* 43.5 44.7 40.1  MCV 97.5 98.4 100.0 103.9*  PLT 207 207 171 152   Cardiac Enzymes: No results for input(s): "CKTOTAL", "CKMB", "CKMBINDEX", "TROPONINI" in the last 8760 hours. BNP: Invalid input(s): "POCBNP" No results found for: "HGBA1C" Lab Results  Component Value Date   TSH 1.741 07/03/2022   Lab Results  Component Value Date  CVUDTHYH88 395 12/30/2020   Lab Results  Component Value Date   FOLATE 20.9 12/30/2020   Lab Results  Component Value Date   IRON 130 04/14/2022   TIBC 333 04/14/2022   FERRITIN 442 (H) 04/14/2022    Imaging and Procedures obtained prior to SNF admission: DG Chest Port 1 View  Result Date: 07/02/2022 CLINICAL DATA:  New atrial fibrillation. Recent laparoscopic surgery this week, hiatal hernia repair. EXAM: PORTABLE CHEST 1 VIEW COMPARISON:  None Available. FINDINGS: Low lung volumes. The heart is normal in size for technique. Aortic atherosclerosis and tortuosity. Streaky opacities at the left lung base likely related to atelectasis. There may be a small amount of left pleural fluid. No pneumothorax. No pulmonary edema. IMPRESSION: 1. Low lung volumes with left  basilar atelectasis and possible small left pleural effusion. 2. Aortic atherosclerosis and tortuosity. Electronically Signed   By: Keith Rake M.D.   On: 07/02/2022 23:55    Assessment/Plan 1. Paroxysmal A-fib (HCC) ZIO patch On Metorpolol Sometimes nurses are holding it due to bradycardia Will Possible need lower dose  No Anticoagulation due to her h/o Chronic GI bleed 2. S/P Nissen fundoplication (without gastrostomy tube) procedure Doing well pain controlled Follow with Dr Rosendo Gros  3. Iron deficiency anemia due to chronic blood loss Iron infusion per Dr Irene Limbo 4 Chronic GI bleed Now s/p Fundoplication On PPI 5 Depression On Doxepin 6 Insomnia On Vibryd   Family/ staff Communication:   Labs/tests ordered:

## 2022-07-07 ENCOUNTER — Telehealth: Payer: Self-pay | Admitting: Cardiovascular Disease

## 2022-07-07 ENCOUNTER — Encounter: Payer: Self-pay | Admitting: Internal Medicine

## 2022-07-07 MED ORDER — METOPROLOL TARTRATE 25 MG PO TABS: 12.5000 mg | ORAL_TABLET | Freq: Two times a day (BID) | ORAL | 2 refills | Status: AC

## 2022-07-07 NOTE — Telephone Encounter (Signed)
Pt c/o medication issue:  1. Name of Medication: metoprolol tartrate (LOPRESSOR) 25 MG tablet  2. How are you currently taking this medication (dosage and times per day)? Has not taken today   3. Are you having a reaction (difficulty breathing--STAT)?   4. What is your medication issue? Andee Poles is calling from [facility] stating the patient was diagnosis with new onset Afib, so she was put on metoprolol. They have been holding the metoprolol if her HR is under 60 or systolic is less than 95. Patients systolic has not been less than 95, but her HR has been in the 50's on several occasions causing metoprolol to be held. She reports bradycardia occurred once on 08/25, once on 08/26, twice on 08/27, and once today. When patient is released from [facility] the pt is requesting a home health nurse to check her BP. [facility] states they are unable to accommodate this for the patient due to her needing this twice a day. Please advise.

## 2022-07-07 NOTE — Telephone Encounter (Signed)
[facility] nurse Lytle Butte, RN reports that patient's pulse dips to the 50-60 range. They with held met tart 25 mg twice daily Fri and Sat evening, Sunday AM and PM, yesterday AM, and this AM. She also wants to know if patient can be cleared for d/c from rehab to extended care. Spoke with Carmelia Roller, NP who ordered metoprolol tartrate 12.16m twice daily. Patient can be d/c to home. Informed Drew of orders.

## 2022-07-11 ENCOUNTER — Inpatient Hospital Stay (HOSPITAL_COMMUNITY)
Admission: EM | Admit: 2022-07-11 | Discharge: 2022-08-09 | DRG: 300 | Disposition: E | Payer: Medicare Other | Source: Skilled Nursing Facility | Attending: Internal Medicine | Admitting: Internal Medicine

## 2022-07-11 ENCOUNTER — Emergency Department (HOSPITAL_COMMUNITY): Payer: Medicare Other

## 2022-07-11 ENCOUNTER — Encounter (HOSPITAL_COMMUNITY): Payer: Self-pay

## 2022-07-11 ENCOUNTER — Other Ambulatory Visit: Payer: Self-pay

## 2022-07-11 DIAGNOSIS — G8191 Hemiplegia, unspecified affecting right dominant side: Secondary | ICD-10-CM

## 2022-07-11 DIAGNOSIS — Z515 Encounter for palliative care: Secondary | ICD-10-CM | POA: Diagnosis not present

## 2022-07-11 DIAGNOSIS — I7102 Dissection of abdominal aorta: Principal | ICD-10-CM | POA: Diagnosis present

## 2022-07-11 DIAGNOSIS — I314 Cardiac tamponade: Secondary | ICD-10-CM | POA: Diagnosis present

## 2022-07-11 DIAGNOSIS — A419 Sepsis, unspecified organism: Secondary | ICD-10-CM | POA: Diagnosis not present

## 2022-07-11 DIAGNOSIS — R570 Cardiogenic shock: Secondary | ICD-10-CM | POA: Diagnosis present

## 2022-07-11 DIAGNOSIS — D72829 Elevated white blood cell count, unspecified: Secondary | ICD-10-CM

## 2022-07-11 DIAGNOSIS — G4730 Sleep apnea, unspecified: Secondary | ICD-10-CM | POA: Diagnosis present

## 2022-07-11 DIAGNOSIS — E785 Hyperlipidemia, unspecified: Secondary | ICD-10-CM | POA: Diagnosis present

## 2022-07-11 DIAGNOSIS — K219 Gastro-esophageal reflux disease without esophagitis: Secondary | ICD-10-CM | POA: Diagnosis present

## 2022-07-11 DIAGNOSIS — I509 Heart failure, unspecified: Secondary | ICD-10-CM | POA: Diagnosis present

## 2022-07-11 DIAGNOSIS — I7101 Dissection of ascending aorta: Principal | ICD-10-CM

## 2022-07-11 DIAGNOSIS — I71011 Dissection of aortic arch: Secondary | ICD-10-CM

## 2022-07-11 DIAGNOSIS — I110 Hypertensive heart disease with heart failure: Secondary | ICD-10-CM | POA: Diagnosis present

## 2022-07-11 DIAGNOSIS — I468 Cardiac arrest due to other underlying condition: Secondary | ICD-10-CM | POA: Diagnosis present

## 2022-07-11 DIAGNOSIS — E872 Acidosis, unspecified: Secondary | ICD-10-CM | POA: Diagnosis present

## 2022-07-11 DIAGNOSIS — Z96642 Presence of left artificial hip joint: Secondary | ICD-10-CM | POA: Diagnosis present

## 2022-07-11 DIAGNOSIS — Z66 Do not resuscitate: Secondary | ICD-10-CM | POA: Diagnosis not present

## 2022-07-11 DIAGNOSIS — Z6837 Body mass index (BMI) 37.0-37.9, adult: Secondary | ICD-10-CM | POA: Diagnosis not present

## 2022-07-11 DIAGNOSIS — I312 Hemopericardium, not elsewhere classified: Secondary | ICD-10-CM | POA: Diagnosis present

## 2022-07-11 DIAGNOSIS — I10 Essential (primary) hypertension: Secondary | ICD-10-CM

## 2022-07-11 DIAGNOSIS — Z7189 Other specified counseling: Secondary | ICD-10-CM | POA: Diagnosis not present

## 2022-07-11 DIAGNOSIS — E756 Lipid storage disorder, unspecified: Secondary | ICD-10-CM

## 2022-07-11 DIAGNOSIS — E871 Hypo-osmolality and hyponatremia: Secondary | ICD-10-CM

## 2022-07-11 DIAGNOSIS — I7100 Dissection of unspecified site of aorta: Secondary | ICD-10-CM | POA: Diagnosis present

## 2022-07-11 DIAGNOSIS — N179 Acute kidney failure, unspecified: Secondary | ICD-10-CM

## 2022-07-11 DIAGNOSIS — I3139 Other pericardial effusion (noninflammatory): Secondary | ICD-10-CM | POA: Diagnosis present

## 2022-07-11 DIAGNOSIS — R531 Weakness: Secondary | ICD-10-CM

## 2022-07-11 DIAGNOSIS — Z20822 Contact with and (suspected) exposure to covid-19: Secondary | ICD-10-CM | POA: Diagnosis present

## 2022-07-11 LAB — CBC WITH DIFFERENTIAL/PLATELET
Abs Immature Granulocytes: 0.43 10*3/uL — ABNORMAL HIGH (ref 0.00–0.07)
Basophils Absolute: 0.1 10*3/uL (ref 0.0–0.1)
Basophils Relative: 0 %
Eosinophils Absolute: 0.1 10*3/uL (ref 0.0–0.5)
Eosinophils Relative: 0 %
HCT: 44.1 % (ref 36.0–46.0)
Hemoglobin: 14.2 g/dL (ref 12.0–15.0)
Immature Granulocytes: 2 %
Lymphocytes Relative: 15 %
Lymphs Abs: 2.8 10*3/uL (ref 0.7–4.0)
MCH: 33.4 pg (ref 26.0–34.0)
MCHC: 32.2 g/dL (ref 30.0–36.0)
MCV: 103.8 fL — ABNORMAL HIGH (ref 80.0–100.0)
Monocytes Absolute: 1.3 10*3/uL — ABNORMAL HIGH (ref 0.1–1.0)
Monocytes Relative: 7 %
Neutro Abs: 14.7 10*3/uL — ABNORMAL HIGH (ref 1.7–7.7)
Neutrophils Relative %: 76 %
Platelets: 201 10*3/uL (ref 150–400)
RBC: 4.25 MIL/uL (ref 3.87–5.11)
RDW: 13.8 % (ref 11.5–15.5)
WBC: 19.4 10*3/uL — ABNORMAL HIGH (ref 4.0–10.5)
nRBC: 0 % (ref 0.0–0.2)

## 2022-07-11 LAB — URINALYSIS, ROUTINE W REFLEX MICROSCOPIC
Bilirubin Urine: NEGATIVE
Glucose, UA: 150 mg/dL — AB
Hgb urine dipstick: NEGATIVE
Ketones, ur: NEGATIVE mg/dL
Leukocytes,Ua: NEGATIVE
Nitrite: NEGATIVE
Protein, ur: >=300 mg/dL — AB
Specific Gravity, Urine: 1.037 — ABNORMAL HIGH (ref 1.005–1.030)
pH: 8 (ref 5.0–8.0)

## 2022-07-11 LAB — BASIC METABOLIC PANEL
Anion gap: 10 (ref 5–15)
BUN: 9 mg/dL (ref 8–23)
CO2: 20 mmol/L — ABNORMAL LOW (ref 22–32)
Calcium: 8.6 mg/dL — ABNORMAL LOW (ref 8.9–10.3)
Chloride: 100 mmol/L (ref 98–111)
Creatinine, Ser: 1.25 mg/dL — ABNORMAL HIGH (ref 0.44–1.00)
GFR, Estimated: 44 mL/min — ABNORMAL LOW (ref >60)
Glucose, Bld: 288 mg/dL — ABNORMAL HIGH (ref 70–99)
Potassium: 3.1 mmol/L — ABNORMAL LOW (ref 3.5–5.1)
Sodium: 130 mmol/L — ABNORMAL LOW (ref 135–145)

## 2022-07-11 LAB — I-STAT CHEM 8, ED
BUN: 9 mg/dL (ref 8–23)
Calcium, Ion: 1.22 mmol/L (ref 1.15–1.40)
Chloride: 104 mmol/L (ref 98–111)
Creatinine, Ser: 0.8 mg/dL (ref 0.44–1.00)
Glucose, Bld: 310 mg/dL — ABNORMAL HIGH (ref 70–99)
HCT: 46 % (ref 36.0–46.0)
Hemoglobin: 15.6 g/dL — ABNORMAL HIGH (ref 12.0–15.0)
Potassium: 3.8 mmol/L (ref 3.5–5.1)
Sodium: 136 mmol/L (ref 135–145)
TCO2: 18 mmol/L — ABNORMAL LOW (ref 22–32)

## 2022-07-11 LAB — LACTIC ACID, PLASMA
Lactic Acid, Venous: 8.6 mmol/L (ref 0.5–1.9)
Lactic Acid, Venous: 8.1 mmol/L (ref 0.5–1.9)

## 2022-07-11 LAB — MRSA NEXT GEN BY PCR, NASAL: MRSA by PCR Next Gen: NOT DETECTED

## 2022-07-11 LAB — BRAIN NATRIURETIC PEPTIDE: B Natriuretic Peptide: 441.2 pg/mL — ABNORMAL HIGH (ref 0.0–100.0)

## 2022-07-11 LAB — TROPONIN I (HIGH SENSITIVITY): Troponin I (High Sensitivity): 77 ng/L — ABNORMAL HIGH (ref <18)

## 2022-07-11 LAB — SARS CORONAVIRUS 2 BY RT PCR: SARS Coronavirus 2 by RT PCR: NEGATIVE

## 2022-07-11 MED ORDER — ACETAMINOPHEN 650 MG RE SUPP: 650.0000 mg | Freq: Four times a day (QID) | RECTAL | Status: DC | PRN

## 2022-07-11 MED ORDER — ESMOLOL HCL-SODIUM CHLORIDE 2000 MG/100ML IV SOLN
25.0000 ug/kg/min | INTRAVENOUS | Status: DC
  Administered 2022-07-11: 25 ug/kg/min via INTRAVENOUS
  Administered 2022-07-12: 50 ug/kg/min via INTRAVENOUS
  Filled 2022-07-11 (×2): qty 100

## 2022-07-11 MED ORDER — GLYCOPYRROLATE 0.2 MG/ML IJ SOLN: 0.2000 mg | INTRAMUSCULAR | Status: DC | PRN

## 2022-07-11 MED ORDER — ONDANSETRON HCL 4 MG/2ML IJ SOLN: 4.0000 mg | Freq: Four times a day (QID) | INTRAMUSCULAR | Status: DC | PRN

## 2022-07-11 MED ORDER — EPINEPHRINE HCL 5 MG/250ML IV SOLN IN NS
INTRAVENOUS | Status: AC
  Filled 2022-07-11: qty 250

## 2022-07-11 MED ORDER — FENTANYL CITRATE PF 50 MCG/ML IJ SOSY
25.0000 ug | PREFILLED_SYRINGE | INTRAMUSCULAR | Status: DC | PRN
  Administered 2022-07-11 – 2022-07-12 (×2): 50 ug via INTRAVENOUS
  Filled 2022-07-11 (×2): qty 1

## 2022-07-11 MED ORDER — SODIUM CHLORIDE 0.9 % IV BOLUS: 1000.0000 mL | Freq: Once | INTRAVENOUS | Status: DC

## 2022-07-11 MED ORDER — DOCUSATE SODIUM 100 MG PO CAPS: 100.0000 mg | ORAL_CAPSULE | Freq: Two times a day (BID) | ORAL | Status: DC | PRN

## 2022-07-11 MED ORDER — POLYETHYLENE GLYCOL 3350 17 G PO PACK: 17.0000 g | PACK | Freq: Every day | ORAL | Status: DC | PRN

## 2022-07-11 MED ORDER — SODIUM CHLORIDE 0.9 % IV SOLN: INTRAVENOUS | Status: DC

## 2022-07-11 MED ORDER — GLYCOPYRROLATE 1 MG PO TABS: 1.0000 mg | ORAL_TABLET | ORAL | Status: DC | PRN

## 2022-07-11 MED ORDER — SODIUM CHLORIDE 0.9 % IV SOLN: INTRAVENOUS | Status: DC | PRN

## 2022-07-11 MED ORDER — IOHEXOL 350 MG/ML SOLN
75.0000 mL | Freq: Once | INTRAVENOUS | Status: AC | PRN
  Administered 2022-07-11: 75 mL via INTRAVENOUS

## 2022-07-11 MED ORDER — IOHEXOL 350 MG/ML SOLN
100.0000 mL | Freq: Once | INTRAVENOUS | Status: AC | PRN
  Administered 2022-07-11: 100 mL via INTRAVENOUS

## 2022-07-11 MED ORDER — EPINEPHRINE HCL 5 MG/250ML IV SOLN IN NS
INTRAVENOUS | Status: AC
  Administered 2022-07-11: 10 ug/min via INTRAVENOUS
  Filled 2022-07-11: qty 250

## 2022-07-11 MED ORDER — POLYVINYL ALCOHOL 1.4 % OP SOLN: 1.0000 [drp] | Freq: Four times a day (QID) | OPHTHALMIC | Status: DC | PRN

## 2022-07-11 MED ORDER — LACTATED RINGERS IV BOLUS
1000.0000 mL | Freq: Once | INTRAVENOUS | Status: AC
  Administered 2022-07-11: 1000 mL via INTRAVENOUS

## 2022-07-11 MED ORDER — ACETAMINOPHEN 325 MG PO TABS: 650.0000 mg | ORAL_TABLET | Freq: Four times a day (QID) | ORAL | Status: DC | PRN

## 2022-07-11 MED ORDER — EPINEPHRINE HCL 5 MG/250ML IV SOLN IN NS
0.5000 ug/min | INTRAVENOUS | Status: DC
  Administered 2022-07-11: 0.5 ug/min via INTRAVENOUS
  Administered 2022-07-11: 10 ug/min via INTRAVENOUS

## 2022-07-11 NOTE — ED Provider Notes (Signed)
[facility] EMERGENCY DEPARTMENT Provider Note   CSN: [id number] Arrival date & time: 07/31/2022  6720     History  Chief Complaint  Patient presents with   Shortness of Breath   Weakness    Molly Lucas is a 76 y.o. female.  The history is provided by the EMS personnel and medical records. No language interpreter was used.  Shortness of Breath Weakness Associated symptoms: shortness of breath      76 year old female recently had a Nissen fundoplication on 9/47, recently diagnosed with atrial fibrillation currently on Zio patch patch and on metoprolol, who was sent here from a skilled nursing facility for evaluation of shortness of breath.  Per EMS, patient was noted to complaining of shortness of breath and weakness that started last night.  When arrived, patient was diaphoretic, altered, with soft blood pressure and tachycardia.  Patient received epinephrine and IV fluid prior to arrival.  She has a temporal temp of 96.  No report of any abdominal pain.  Patient was nauseous and then did receive Zofran.  Level 5 caveat due to altered mental status.  Home Medications Prior to Admission medications   Medication Sig Start Date End Date Taking? Authorizing Provider  Ascorbic Acid (VITAMIN C) 1000 MG tablet Take 1,000 mg by mouth daily.    [provider]  cetirizine (ZYRTEC) 10 MG tablet Take 10 mg by mouth in the morning and at bedtime.    [provider]  Cholecalciferol (VITAMIN D3) 50 MCG (2000 UT) capsule Take 4,000 Units by mouth daily.    [provider]  COLLAGEN PO Take 1 packet by mouth daily.    [provider]  cyanocobalamin 1000 MCG tablet Take 1,000 mcg by mouth daily.    [provider]  docusate sodium (COLACE) 100 MG capsule Take 1 capsule (100 mg total) by mouth daily as needed for mild constipation. 07/02/22   Royal Hawthorn, NP  Doxepin HCl 6 MG TABS Take 6 mg by mouth every evening.    [provider]  DULoxetine (CYMBALTA) 60 MG capsule Take 60 mg by mouth daily.    [provider]  fluticasone (FLONASE) 50 MCG/ACT nasal spray Place 2 sprays into both nostrils daily.    [provider]  HYDROcodone-acetaminophen (NORCO) 5-325 MG tablet Take 1.5 tablets by mouth every 4 (four) hours as needed for moderate pain. 07/05/22   Medina-Vargas, Monina C, NP  hypromellose (SYSTANE OVERNIGHT THERAPY) 0.3 % GEL ophthalmic ointment Place 1 Application into both eyes at bedtime.    [provider]  Menthol, Topical Analgesic, (BIOFREEZE) 4 % GEL Apply 1 Application topically daily.    [provider]  metoprolol tartrate (LOPRESSOR) 25 MG tablet Take 0.5 tablets (12.5 mg total) by mouth 2 (two) times daily. 07/07/22   Lenna Sciara, NP  Omega-3 1000 MG CAPS Take 1,000 mg by mouth daily.    [provider]  pantoprazole (PROTONIX) 40 MG tablet Take 40 mg by mouth daily.    [provider]  Propylene Glycol (SYSTANE BALANCE) 0.6 % SOLN Place 1 drop into both eyes as needed (dry eyes).    [provider]  Turmeric (QC TUMERIC COMPLEX) 500 MG CAPS Take by mouth.    [provider]  VIIBRYD 40 MG TABS Take 40 mg by mouth daily. 12/24/20   [provider]      Allergies    Nsaids, Penicillins, and Other    Review of Systems  Review of Systems  Unable to perform ROS: Mental status change  Respiratory:  Positive for shortness of breath.   Neurological:  Positive for weakness.    Physical Exam Updated Vital Signs BP (!) 82/54   Pulse 88   Temp 99.7 F (37.6 C)   Resp (!) 27   SpO2 100%  Physical Exam Vitals and nursing note reviewed.  Constitutional:      Appearance: She is well-developed. She is obese. She is ill-appearing and diaphoretic.     Comments: Ill-appearing elderly female, diaphoretic, not answering question appropriately  HENT:     Head: Atraumatic.  Eyes:     Conjunctiva/sclera:  Conjunctivae normal.  Cardiovascular:     Rate and Rhythm: Rhythm irregular.  Pulmonary:     Effort: Pulmonary effort is normal.     Breath sounds: No decreased breath sounds, wheezing, rhonchi or rales.  Chest:     Comments: Laparoscopic surgical scars noted to abdomen with surrounding ecchymosis but no significant tenderness to palpation.  No cellulitic changes Musculoskeletal:     Cervical back: Normal range of motion and neck supple.  Skin:    Findings: No rash.  Neurological:     Mental Status: She is disoriented.     Comments: Patient is altered, not following command but appears to be moving all 4 extremities  Psychiatric:        Mood and Affect: Mood normal.     ED Results / Procedures / Treatments   Labs (all labs ordered are listed, but only abnormal results are displayed) Labs Reviewed  CBC WITH DIFFERENTIAL/PLATELET - Abnormal; Notable for the following components:      Result Value   WBC 19.4 (*)    MCV 103.8 (*)    Neutro Abs 14.7 (*)    Monocytes Absolute 1.3 (*)    Abs Immature Granulocytes 0.43 (*)    All other components within normal limits  BRAIN NATRIURETIC PEPTIDE - Abnormal; Notable for the following components:   B Natriuretic Peptide 441.2 (*)    All other components within normal limits  URINALYSIS, ROUTINE W REFLEX MICROSCOPIC - Abnormal; Notable for the following components:   APPearance HAZY (*)    Specific Gravity, Urine 1.037 (*)    Glucose, UA 150 (*)    Protein, ur >=300 (*)    Bacteria, UA RARE (*)    All other components within normal limits  LACTIC ACID, PLASMA - Abnormal; Notable for the following components:   Lactic Acid, Venous 8.6 (*)    All other components within normal limits  LACTIC ACID, PLASMA - Abnormal; Notable for the following components:   Lactic Acid, Venous 8.1 (*)    All other components within normal limits  BASIC METABOLIC PANEL - Abnormal; Notable for the following components:   Sodium 130 (*)    Potassium 3.1  (*)    CO2 20 (*)    Glucose, Bld 288 (*)    Creatinine, Ser 1.25 (*)    Calcium 8.6 (*)    GFR, Estimated 44 (*)    All other components within normal limits  I-STAT CHEM 8, ED - Abnormal; Notable for the following components:   Glucose, Bld 310 (*)    TCO2 18 (*)    Hemoglobin 15.6 (*)    All other components within normal limits  TROPONIN I (HIGH SENSITIVITY) - Abnormal; Notable for the following components:   Troponin I (High Sensitivity) 77 (*)    All other components within normal limits  SARS CORONAVIRUS 2 BY RT PCR  CULTURE, BLOOD (ROUTINE X 2)  CULTURE, BLOOD (ROUTINE X 2)  TYPE AND SCREEN  TROPONIN I (HIGH SENSITIVITY)    EKG None  Date: 07/24/2022  Rate: 91  Rhythm: normal sinus rhythm  QRS Axis: normal  Intervals: normal  ST/T Wave abnormalities: normal  Conduction Disutrbances: borderline prolonged QT  Narrative Interpretation:   Old EKG Reviewed: No significant changes noted    Radiology CT ABDOMEN PELVIS W CONTRAST  Result Date: 07/10/2022 CLINICAL DATA:  Sepsis status post surgery. EXAM: CT ABDOMEN AND PELVIS WITH CONTRAST TECHNIQUE: Multidetector CT imaging of the abdomen and pelvis was performed using the standard protocol following bolus administration of intravenous contrast. RADIATION DOSE REDUCTION: This exam was performed according to the departmental dose-optimization program which includes automated exposure control, adjustment of the mA and/or kV according to patient size and/or use of iterative reconstruction technique. CONTRAST:  148m OMNIPAQUE IOHEXOL 350 MG/ML SOLN COMPARISON:  02/17/2018 FINDINGS: Lower chest: See separate CT angio chest report from today. Hepatobiliary: There is a heterogeneous attenuation of the liver parenchyma. Focal peripheral area of low density within the anterior aspect of the lateral segment of left lobe of liver is noted measuring 4.8 x 3.0 cm, image 16/4. Although nonspecific differential considerations include hepatic  infarct versus focal fatty deposition. There is marked diffuse gallbladder wall edema with wall thickness measuring 1.3 cm, image 35/4. No biliary dilatation. Pancreas: Unremarkable. No pancreatic ductal dilatation or surrounding inflammatory changes. Spleen: Normal in size without focal abnormality. Adrenals/Urinary Tract: Adrenal glands are unremarkable. Kidneys are normal, without renal calculi, focal lesion, or hydronephrosis. Bladder decompressed around a Foley catheter. Stomach/Bowel: The stomach is diffusely distended. There are postoperative changes from recent hiatal hernia repair. No bowel wall thickening, inflammation, or distension. Moderate retained stool noted within the rectum. Vascular/Lymphatic: Continuation of thoracic aortic dissection into the abdominal aorta. The dissection a extends beyond the bifurcation into bilateral common iliac arteries. The left common iliac artery dissection terminates proximal to the bifurcation, image 51/4. The right common iliac artery dissection flap extends into the right external iliac artery and terminates proximal to the right common femoral artery, image 72/4. The right internal iliac artery arises off the false lumen, image 60 and image 61 of series 4. The celiac artery, superior mesenteric artery, and bilateral renal arteries appear to arise off the false lumen, image 27/4, image 29/4, and image 30/4. The inferior mesenteric artery also appears to arise off the false lumen. The portal vein, portal venous confluence and SMV all appear patent and perfused. Splenic vein is also patent. Right femoral approach central venous catheter is noted which terminates in the distal external iliac vein. No abdominopelvic adenopathy. Reproductive: Uterus and bilateral adnexa are unremarkable. Other: Right upper quadrant edema centered around the gallbladder is noted. Trace free fluid noted within the dependent portion of the pelvis. Scattered foci of pneumoperitoneum  identified which may be sequelae of recent robotic assisted hiatal hernia repair. Musculoskeletal: No acute or significant osseous findings. IMPRESSION: 1. Continuation of thoracic aortic dissection into the abdominal aorta. The dissection flap extends beyond the aortic bifurcation into bilateral common iliac arteries. 2. The right common iliac dissection flap extends into the right external iliac artery and terminates proximal to the right common femoral artery. The right internal iliac artery arises off the false lumen. The left common iliac dissection flap terminates proximal to the bifurcation. 3. The celiac artery, superior mesenteric artery, bilateral renal arteries, and inferior mesenteric artery arise  off the false lumen. 4. There is marked diffuse edema involving the gallbladder. This is of uncertain clinical significance. Differential considerations include acute cholecystitis, or gallbladder wall edema secondary to heart failure. Gallbladder ischemia secondary to dissection would be another potential diagnostic consideration. 5. Heterogeneous enhancement of the liver parenchyma is identified. Within the lateral segment of left hepatic lobe there is a wedge-shaped area of more focal hypoenhancement which is of uncertain significance. This may simply reflect altered perfusion secondary to dissection versus focal fatty deposition versus focal infarct. 6. Postoperative changes from recent hiatal hernia repair. Scattered foci of pneumoperitoneum identified which may be sequelae of recent robotic assisted hiatal hernia repair. Critical Value/emergent results were called by telephone at the time of interpretation on 08/05/2022 at 9:11 am to provider Dorie Rank, MD, who verbally acknowledged these results. Electronically Signed   By: Kerby Moors M.D.   On: 08/03/2022 09:12   DG Chest Port 1 View  Result Date: 07/24/2022 CLINICAL DATA:  Shortness of breath. EXAM: PORTABLE CHEST 1 VIEW COMPARISON:  July 02, 2022. FINDINGS: Stable cardiomegaly. Right lung is clear. Left basilar atelectasis or infiltrate is noted with associated pleural effusion. Bony thorax is unremarkable. IMPRESSION: Stable left basilar atelectasis or infiltrate with small pleural effusion. Electronically Signed   By: Marijo Conception M.D.   On: 07/21/2022 09:05   CT ANGIO CHEST AORTA W/CM &/OR WO/CM  Result Date: 07/27/2022 CLINICAL DATA:  Evaluate for aortic dissection. EXAM: CT ANGIOGRAPHY CHEST WITH CONTRAST TECHNIQUE: Multidetector CT imaging of the chest was performed using the standard protocol during bolus administration of intravenous contrast. Multiplanar CT image reconstructions and MIPs were obtained to evaluate the vascular anatomy. RADIATION DOSE REDUCTION: This exam was performed according to the departmental dose-optimization program which includes automated exposure control, adjustment of the mA and/or kV according to patient size and/or use of iterative reconstruction technique. CONTRAST:  84m OMNIPAQUE IOHEXOL 350 MG/ML SOLN COMPARISON:  02/17/2018 FINDINGS: Cardiovascular: The heart size appears normal. There is a moderate pericardial fluid collection measuring 54 Hounsfield units. Hemopericardium cannot be excluded. Ascending thoracic aortic aneurysm measures 4.6 cm, image 52/8. There is a DeBakey type 1, Stanford type A dissection which originates at the aortic root and extends into the abdominal aorta. The great vessels arise off the true lumen of the aorta. Mediastinum/Nodes: Patulous esophagus compatible with chronic dysmotility. Postoperative changes from hiatal hernia repair with ex vacuo fluid collection surrounding the distal esophagus. The trachea and thyroid gland appear normal. No adenopathy. Lungs/Pleura: Small to moderate bilateral pleural effusions. Ground-glass attenuation and interlobular septal thickening is identified compatible with pulmonary edema. Bibasilar subsegmental atelectasis. Upper Abdomen: See  separate abdominal CT report from 07/20/2022. Musculoskeletal: No acute abnormality. Review of the MIP images confirms the above findings. IMPRESSION: 1. Examination is positive for Stanford type A dissection. 2. Moderate pericardial fluid collection measuring 55 Hounsfield units. Hemopericardium cannot be excluded. 3. Small to moderate bilateral pleural effusions and pulmonary edema compatible with CHF. Critical Value/emergent results were called by telephone at the time of interpretation on 07/30/2022 at 8:52 am to provider JDorie Rank MD , who verbally acknowledged these results. Electronically Signed   By: TKerby MoorsM.D.   On: 07/17/2022 08:53   CT Angio Chest PE W and/or Wo Contrast  Result Date: 07/15/2022 CLINICAL DATA:  Rule out pulmonary embolism. EXAM: CT ANGIOGRAPHY CHEST WITH CONTRAST TECHNIQUE: Multidetector CT imaging of the chest was performed using the standard protocol during bolus administration of intravenous contrast. Multiplanar CT  image reconstructions and MIPs were obtained to evaluate the vascular anatomy. RADIATION DOSE REDUCTION: This exam was performed according to the departmental dose-optimization program which includes automated exposure control, adjustment of the mA and/or kV according to patient size and/or use of iterative reconstruction technique. CONTRAST:  130m OMNIPAQUE IOHEXOL 350 MG/ML SOLN COMPARISON:  02/17/2018 FINDINGS: Cardiovascular: Satisfactory opacification of the pulmonary arteries to the segmental level. No evidence of pulmonary embolism. Heart size appears normal. There is a moderate pericardial fluid collection which measures 55 Hounsfield units, image 86/4. Suspicious for hemopericardium. There is prominent reflux of contrast material from the right ventricle into the IVC and hepatic veins which is compatible with passive venous congestion suggestive of right heart failure. Mediastinum/Nodes: Thyroid gland, trachea, and esophagus are unremarkable. No  enlarged lymph nodes. The proximal and mid esophagus appear diffusely patulous compatible with chronic dysmotility. Status post repair of previous large hiatal hernia with ex vacuo fluid collection surrounding the distal esophagus measuring 7.4 x 3.5 by 8.7 cm. Lungs/Pleura: Small to moderate bilateral pleural effusions identified. Bilateral interlobular septal thickening and ground-glass attenuation is noted compatible with pulmonary edema. Atelectasis noted within both lung bases. Upper Abdomen: Sub optimal visualization of the upper abdomen due to phase of contrast opacification. Postoperative changes from hiatal hernia repair noted. Musculoskeletal: No chest wall abnormality. No acute or significant osseous findings. Review of the MIP images confirms the above findings. IMPRESSION: 1. No evidence for acute pulmonary embolus. 2. Moderate, intermediate to high attenuation pericardial fluid collection is identified worrisome for hemopericardium. 3. Reflux of contrast material into the IVC and hepatic veins compatible with right heart failure. 4. Small to moderate bilateral pleural effusions and pulmonary edema compatible with CHF. 5. Status post repair of previous large hiatal hernia with ex vacuo fluid collection surrounding the distal esophagus. Electronically Signed   By: TKerby MoorsM.D.   On: 07/25/2022 08:46    Procedures .Critical Care  Performed by: TDomenic Moras PA-C Authorized by: TDomenic Moras PA-C   Critical care provider statement:    Critical care time (minutes):  90   Critical care was time spent personally by me on the following activities:  Development of treatment plan with patient or surrogate, discussions with consultants, evaluation of patient's response to treatment, examination of patient, ordering and review of laboratory studies, ordering and review of radiographic studies, ordering and performing treatments and interventions, pulse oximetry, re-evaluation of patient's condition  and review of old charts     Medications Ordered in ED Medications  EPINEPHrine NaCl 5-0.9 MG/250ML-% premix infusion (has no administration in time range)  esmolol (BREVIBLOC) 2000 mg / 100 mL (20 mg/mL) infusion (25 mcg/kg/min  95.4 kg Intravenous Rate/Dose Change 08/05/2022 0902)  0.9 %  sodium chloride infusion (has no administration in time range)  docusate sodium (COLACE) capsule 100 mg (has no administration in time range)  polyethylene glycol (MIRALAX / GLYCOLAX) packet 17 g (has no administration in time range)  ondansetron (ZOFRAN) injection 4 mg (has no administration in time range)  0.9 %  sodium chloride infusion (has no administration in time range)  acetaminophen (TYLENOL) tablet 650 mg (has no administration in time range)    Or  acetaminophen (TYLENOL) suppository 650 mg (has no administration in time range)  glycopyrrolate (ROBINUL) tablet 1 mg (has no administration in time range)    Or  glycopyrrolate (ROBINUL) injection 0.2 mg (has no administration in time range)    Or  glycopyrrolate (ROBINUL) injection 0.2 mg (has no administration in  time range)  polyvinyl alcohol (LIQUIFILM TEARS) 1.4 % ophthalmic solution 1 drop (has no administration in time range)  fentaNYL (SUBLIMAZE) injection 25-100 mcg (has no administration in time range)  lactated ringers bolus 1,000 mL (0 mLs Intravenous Stopped 07/31/2022 0903)  iohexol (OMNIPAQUE) 350 MG/ML injection 100 mL (100 mLs Intravenous Contrast Given 07/13/2022 0812)  iohexol (OMNIPAQUE) 350 MG/ML injection 75 mL (75 mLs Intravenous Contrast Given 08/07/2022 0813)    ED Course/ Medical Decision Making/ A&P Clinical Course as of 07/18/2022 1027  Sat Jul 11, 2022  0740 BP is improving.  Mental status improving but still not at baseline.  Continue to monitor closely.  Proceed with CT [JK]  0800 Preliminary review of CT suggests aortic dissection.  Will adjust BP parameters.   [JK]  [id number] Reviewed CT scan findings with Dr. Clovis Riley.  Patient  has a type a dissection.  She does have a pericardial effusion.  Evidence of severe right heart strain. [JK]  6295 Patient's mental status is back to baseline.  She is alert and awake and answering questions appropriately.  I discussed the findings of her patient with her.  Explaining the severity of her illness and the life-threatening nature of her dissection. [JK]  1014 Case discussed with critical care.  Will evaluate pt.  OR contacted to get an update, Dr Cyndia Bent has finished the case will be down shortly. [JK]  1026 Dr Cyndia Bent is at the bedside. Will contact family regarding plan [JK]    Clinical Course User Index [JK] Dorie Rank, MD                           Medical Decision Making Amount and/or Complexity of Data Reviewed Labs: ordered. Radiology: ordered.   BP (!) 82/54   Pulse 79   Temp 99.6 F (37.6 C)   Resp (!) 30   SpO2 96%   6:55 AM This is a 76 year old female recently had a Nissen fundoplication approximately 10 days ago brought here for shortness of breath and weakness.  EMS was called due to patient is having complaints of shortness of breath throughout last night and appears more altered.  Upon arrival, EMS noted that patient was tachycardic and diaphoretic.  Patient also appears altered.  Patient did receive epinephrine as well as some IV fluid prior to arrival.  I was unable to obtain much history from patient as she is altered.  Patient is noted to be diaphoretic, and not following commands.  She has multiple laparoscopic abdominal surgical scar noted on her abdomen with surrounding ecchymosis but no obvious signs of infection.  She has a Zio patch on the left side of her chest.  7:11 AM Blood pressure obtain patient is hypotensive with systolic blood pressure of 88/60.  Dr. Sedonia Small insert a femoral central line and norepinephrine have been started.  Dr. Tomi Bamberger was made aware of patient's condition.    7:33 AM Patient is currently receiving lactated Ringer's IV fluid  throughout her femoral central line.  Blood pressure did improve to 284 systolic and patient appears a bit more arousable but still altered.  Attempts were made to reach out to family member over the phone without any success.  We will continue with management.  Anticipate patient may benefit from intubation to help protect airway and decrease risk of aspiration.  8:14 AM I have attempted to reach out to nursing facility to speak with nurse that takes care of patient.  Awaits return  phone call.  Patient is currently at [place].  Wet read of the CT scan by me is concerning for aortic dissection.  Will adjust blood pressure control with a goal of 160 systolic  I have spoken to pt's nurse Sherryl, who  Hernia with mesh repair recently.  Afib with metoprolol '25mg'$  daily.  Decreased metoprolol to 12.'5mg'$ .   Felt fine on Thursday.  Felt bad with nausea, diarrhea, and dizziness and weakness last night.  SOB last night.    8:33 AM Type A Debakey dissection showing pericardial effusion/tamponade.  We have promptly consulted cardiothoracic specialist Dr. Cyndia Bent who is currently in the OR. I spoke with his nurse to notify of our concerns of aortic dissection.     8:43 AM Arterial line was placed.  BP is 737 systolic with MAP 79.  Epi drip d/c.  Will start Esmolol for rate control with goal HR of 60 and BP <106 systolic. May consider cleviprex.  Since pt has pericardial effusion, she may need pericardial centesis if she codes.  Pt is a full code.  At this time she is more alert and I have explained her condition and she was made aware.   Urine obtained shows 300 protein but no signs of UTI.  COVID test came back negative.  Lactic acid is elevated at 8.1 likely reflecting multiorgan failure.  Blood cultures have been drawn.  Evidence of hypokalemia with a potassium of 3.1, CBG is 288, creatinine of 1.25.  Elevated troponin of 77 reflecting stress likely from pericardial effusion.  Elevated white count of 19.4  likely in the setting of stress.  Elevated BNP of 441  8:55 AM I was able to talk to patient's son, Kourtnie Sachs over the phone.  He is made aware of the plan and he is currently driving from [place] to be present.  We will also reach out to cardiothoracic surgery to give an update on CT results.  10:26 AM CT surgeon Dr. Cyndia Bent along with critical care team is currently at bedside.  He will reach out to family members to discuss plan of care.    This patient presents to the ED for concern of weakness, this involves an extensive number of treatment options, and is a complaint that carries with it a high risk of complications and morbidity.  The differential diagnosis includes PE, anemia, sepsis, dissection, acs, stroke  Co morbidities that complicate the patient evaluation recent abdominal surgery  GERD  HTN    Additional history obtained:  Additional history obtained from nursing facility, from family member External records from outside source obtained and reviewed including EMR including prior labs and imaging  Lab Tests:  I Ordered, and personally interpreted labs.  The pertinent results include:  as above  Imaging Studies ordered:  I ordered imaging studies including CT chest/abd/pelvis I independently visualized and interpreted imaging which showed type A dissection I agree with the radiologist interpretation  Cardiac Monitoring:  The patient was maintained on a cardiac monitor.  I personally viewed and interpreted the cardiac monitored which showed an underlying rhythm of: afib  Medicines ordered and prescription drug management:  I ordered medication including esmolol  for BP control Reevaluation of the patient after these medicines showed that the patient improved I have reviewed the patients home medicines and have made adjustments as needed  Test Considered: as above  Critical Interventions: dissection study  BP control    Consultations Obtained:  I  requested consultation with the critical care and CT  surgery,  and discussed lab and imaging findings as well as pertinent plan - they recommend: admission  Problem List / ED Course: sob  Dissection  hemopericardium  Reevaluation:  After the interventions noted above, I reevaluated the patient and found that they have :improved  Social Determinants of Health: none  Dispostion:  After consideration of the diagnostic results and the patients response to treatment, I feel that the patent would benefit from admission.         Final Clinical Impression(s) / ED Diagnoses Final diagnoses:  Ascending aortic dissection (HCC)  Pericardial effusion    Rx / DC Orders ED Discharge Orders     None         Domenic Moras, PA-C 07/15/2022 1147    Dorie Rank, MD 07/12/22 [phone]

## 2022-07-11 NOTE — H&P (Signed)
NAME:  Molly Lucas, MRN:  [id number], DOB:  [birth year], LOS: 0 ADMISSION DATE:  07/20/2022, CONSULTATION DATE:  07/30/2022  REFERRING MD:  Blair Dolphin, CHIEF COMPLAINT:  aortic dissection.    History of Present Illness:  76 year old woman who presented with SOB from STR facility where she was convalescing following Nissen fundoplication.  She was found to be in shock at the time. Code sepsis was initiated and she was given a liter of fluids and started on epinephrine which corrected initial shock. Initially thought to have suffered PE but CT PE showed pericardial effusion and suspicion of dissection. Type A dissection to level of iliacs was confirmed by dedicated dissection study.   Relatively poor performance status with obesity and chronic IDA.   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 Pt states it was a very brief period of high blood pressure due a stressful time in 2010   OA (osteoarthritis) of hip    left   Sleep apnea    dentist made mouthipiece    Past Surgical History:  Procedure Laterality Date   BIOPSY  03/30/2019   Procedure: BIOPSY;  Surgeon: Thornton Park, MD;  Location: [facility];  Service: Gastroenterology;;   [place]  08/2015   with abductor pollicis longus tendon transfer and suspensionplasty   COLONOSCOPY WITH PROPOFOL N/A 03/31/2019   Procedure: COLONOSCOPY WITH PROPOFOL;  Surgeon: Thornton Park, MD;  Location: [facility];  Service: Gastroenterology;  Laterality: N/A;   DE  QUERVAIN'S RELEASE  08/2015   DILATATION & CURRETTAGE/HYSTEROSCOPY WITH RESECTOCOPE  2007   ESOPHAGOGASTRODUODENOSCOPY N/A 11/30/2015   Procedure: ESOPHAGOGASTRODUODENOSCOPY (EGD);  Surgeon: Richmond Campbell, MD;  Location: [facility];  Service: Endoscopy;  Laterality: N/A;   ESOPHAGOGASTRODUODENOSCOPY (EGD) WITH PROPOFOL N/A 03/30/2019   Procedure: ESOPHAGOGASTRODUODENOSCOPY (EGD) WITH PROPOFOL;  Surgeon: Thornton Park, MD;  Location: [facility];  Service: Gastroenterology;  Laterality: N/A;   ESOPHAGOGASTRODUODENOSCOPY (EGD) WITH PROPOFOL N/A 12/31/2020   Procedure: ESOPHAGOGASTRODUODENOSCOPY (EGD) WITH PROPOFOL;  Surgeon: Irene Shipper, MD;  Location: [facility];  Service: Endoscopy;  Laterality: N/A;   ESOPHAGOGASTRODUODENOSCOPY (EGD) WITH PROPOFOL N/A 11/04/2021   Procedure: ESOPHAGOGASTRODUODENOSCOPY (EGD) WITH PROPOFOL;  Surgeon: Ronnette Juniper, MD;  Location: [facility];  Service: Gastroenterology;  Laterality: N/A;   HERNIA REPAIR Left 06/2012   INGUINAL HERNIA REPAIR Right 05/03/2013   Procedure: HERNIA REPAIR INGUINAL ADULT;  Surgeon: Harl Bowie, MD;  Location: [facility] [facility];  Service: General;  Laterality: Right;   INGUINAL HERNIA REPAIR Right 08/23/2013   Procedure: LAPAROSCOPIC INGUINAL HERNIA;  Surgeon: Harl Bowie, MD;  Location: [facility] [facility];  Service: General;  Laterality: Right;   INSERTION OF MESH Right 05/03/2013   Procedure: INSERTION OF MESH;  Surgeon: Harl Bowie, MD;  Location: [facility];  Service: General;  Laterality: Right;   INSERTION OF MESH Right 08/23/2013   Procedure: INSERTION OF MESH;  Surgeon: Harl Bowie, MD;  Location: [facility];  Service: General;  Laterality: Right;   INSERTION OF MESH N/A 06/30/2022   Procedure: INSERTION OF MESH;  Surgeon: Ralene Ok, MD;  Location: [facility];  Service: General;  Laterality: N/A;   METACARPOPHALANGEAL JOINT ARTHRODESIS  08/2015   with local bone graft   TONSILLECTOMY     TOTAL HIP ARTHROPLASTY  06/24/2012    Procedure: TOTAL HIP ARTHROPLASTY ANTERIOR APPROACH;  Surgeon: Mcarthur Rossetti, MD;  Location: [facility];  Service: Orthopedics;  Laterality: Left;  Left total hip arthroplasty, anterior approach   UTERINE FIBROID SURGERY  2007   WISDOM TOOTH EXTRACTION     XI ROBOTIC ASSISTED HIATAL HERNIA REPAIR N/A 06/30/2022   Procedure: ROBOTIC HIATAL HERNIA AND FUNDOPLICATION;  Surgeon: Ralene Ok, MD;  Location: [facility];  Service: General;  Laterality: N/A;    Significant Hospital Events: Including procedures, antibiotic start and stop dates in addition to other pertinent events   9/2 presented in shock from dissection, seen by Dr Cyndia Bent who doesn't feel she is a suitable surgical candidate.   Interim History / Subjective:  Presently normotensive on esmolol. Off pressors. Not communicative.   Objective   Blood pressure (!) 85/70, pulse 70, temperature 99.9 F (37.7 C), temperature source Core (Comment), resp. rate (!) 25, SpO2 96 %.        Intake/Output Summary (Last 24 hours) at 07/12/2022 1042 Last data filed at 08/07/2022 6237 Gross per 24 hour  Intake 1020.54 ml  Output --  Net 1020.54 ml   There were no vitals filed for this visit.  Examination: General: older obese woman in moderate respiratory distress. Cool, clammy.  HENT: Moist mucosae, no signs of trauma.  Lungs: clear bilaterally  Cardiovascular: muffled HS, no JVD.   Abdomen: soft  Extremities:  Extremities cool and poorly perfused. No pulse deficits. Right femoral CVC, right radial arterial line. Neuro: Clouded sensorium, follows commands on left side only.  GU: Foley in place with clear urine  Ancillary tests personally reviewed:   CT angio shows extensive type A dissection with pericardial effusion.  Hyponatremia at 130 Creatinine 1.25  Assessment & Plan:  Critically ill due to acute type A aortic dissection with cardiogenic shock from pericardial tamponade requiring esmolol to control dissection extension.  -  Continue esmolol as currently at least partially compensated.  - Ongoing discussions with Dr Cyndia Bent and sons: have opted for comfort care as very poor surgical candidate with high likelihood of perioperative mortality and certain poor recovery to dependent level of care.  - Comfort care initiated, awaiting for family to arrive from [place].   AKI from shock and possible renal artery involvement - Hold further fluids  Right hemiparesis likely due to left carotid involvement.  - Further worsens prognosis.   Best Practice (right click and "Reselect all SmartList Selections" daily)   Diet/type: NPO DVT prophylaxis: not indicated GI prophylaxis: N/A Lines: Central line and Arterial Line Foley:  Yes, and it is still needed Code Status:  DNR Last date of multidisciplinary goals of care discussion [Dr Cyndia Bent discussed goals of care with family, she is operative and there is no good medical management option. Will transition to comfort care. ]  CRITICAL CARE Performed by: Kipp Brood  Total critical care time: 40 minutes  Critical care time was exclusive of separately billable procedures and treating other patients.  Critical care was necessary to treat or prevent imminent or life-threatening deterioration.  Critical care was time spent personally  by me on the following activities: development of treatment plan with patient and/or surrogate as well as nursing, discussions with consultants, evaluation of patient's response to treatment, examination of patient, obtaining history from patient or surrogate, ordering and performing treatments and interventions, ordering and review of laboratory studies, ordering and review of radiographic studies, pulse oximetry, re-evaluation of patient's condition and participation in multidisciplinary rounds.  Kipp Brood, MD [facility] ICU Physician [place]  Pager: [phone] Mobile: [phone] After hours: [phone].

## 2022-07-11 NOTE — ED Notes (Signed)
Attempting central line at this time 37f rt femoral

## 2022-07-11 NOTE — ED Notes (Signed)
Provider at bedside setup for central line

## 2022-07-11 NOTE — Consult Note (Signed)
[address]       [place] [address]             [phone]      Cardiothoracic Surgery Consultation  Reason for Consult: Acute type A aortic dissection Referring Physician: Dr. Hillard Danker, ER  [place] Molly Lucas is an 76 y.o. female.  HPI:   The patient is a 76 year old woman with a history of hypertension, hyperlipidemia, OSA, anemia, arthritis, and hiatal hernia with GERD who underwent robotic hiatal hernia repair by Dr. Rosendo Gros on 06/30/2022.  She was discharged on postoperative day 1 to [facility] for rehab.  She return to the emergency room on 07/02/2022 with new onset atrial fibrillation she was discharged from the ER with cardiology follow-up and was seen in the office on 07/06/2022.  She was not started on anticoagulation due to history of anemia and severe GI bleeding.  A Zio patch was placed.  She was brought to the emergency room this morning due to shortness of breath.  She reportedly had shortness of breath and weakness that started last night.  In the emergency room she was noted to be diaphoretic with altered mental status with hypotension and tachycardia.  On arrival her blood pressure was 82/54 with a pulse of 88.  She was ill-appearing and diaphoretic and not answering questions.  A CTA of the chest was performed which showed an acute type A aortic dissection extending from the aortic root into the abdominal aorta.  The great vessels were coming off the true lumen.  There is a moderate size pericardial effusion consistent with hemopericardium.  There are small to moderate bilateral pleural effusions and pulmonary edema compatible with congestive heart failure.  Past Medical History:  Diagnosis Date   Allergies    Anemia    iron infusions   Anxiety    Arthritis    Chronic pain of left thumb    Constipation    Deformity of left thumb joint    Z collapse deformity to nondominant left thumb   Depression    Fibroid, uterine    GERD (gastroesophageal  reflux disease)    History of blood transfusion    History of hiatal hernia    Hx of seasonal allergies    Hyperlipemia    Hypertension    hx of off medication now. Pt states it was a very brief period of high blood pressure due a stressful time in 2010   OA (osteoarthritis) of hip    left   Sleep apnea    dentist made mouthipiece     Past Surgical History:  Procedure Laterality Date   BIOPSY  03/30/2019   Procedure: BIOPSY;  Surgeon: Thornton Park, MD;  Location: [place];  Service: Gastroenterology;;   [place]  08/2015   with abductor pollicis longus tendon transfer and suspensionplasty   COLONOSCOPY WITH PROPOFOL N/A 03/31/2019   Procedure: COLONOSCOPY WITH PROPOFOL;  Surgeon: Thornton Park, MD;  Location: [place];  Service: Gastroenterology;  Laterality: N/A;   DE QUERVAIN'S RELEASE  08/2015   DILATATION & CURRETTAGE/HYSTEROSCOPY WITH RESECTOCOPE  2007   ESOPHAGOGASTRODUODENOSCOPY N/A 11/30/2015   Procedure: ESOPHAGOGASTRODUODENOSCOPY (EGD);  Surgeon: Richmond Campbell, MD;  Location: [facility];  Service: Endoscopy;  Laterality: N/A;   ESOPHAGOGASTRODUODENOSCOPY (EGD) WITH PROPOFOL N/A 03/30/2019   Procedure: ESOPHAGOGASTRODUODENOSCOPY (EGD) WITH PROPOFOL;  Surgeon: Thornton Park, MD;  Location: [place];  Service: Gastroenterology;  Laterality: N/A;   ESOPHAGOGASTRODUODENOSCOPY (EGD) WITH PROPOFOL N/A 12/31/2020  Procedure: ESOPHAGOGASTRODUODENOSCOPY (EGD) WITH PROPOFOL;  Surgeon: Irene Shipper, MD;  Location: [facility];  Service: Endoscopy;  Laterality: N/A;   ESOPHAGOGASTRODUODENOSCOPY (EGD) WITH PROPOFOL N/A 11/04/2021   Procedure: ESOPHAGOGASTRODUODENOSCOPY (EGD) WITH PROPOFOL;  Surgeon: Ronnette Juniper, MD;  Location: [facility];  Service: Gastroenterology;  Laterality: N/A;   HERNIA REPAIR Left 06/2012   INGUINAL HERNIA REPAIR Right 05/03/2013   Procedure: HERNIA REPAIR INGUINAL ADULT;  Surgeon: Harl Bowie, MD;   Location: [place];  Service: General;  Laterality: Right;   INGUINAL HERNIA REPAIR Right 08/23/2013   Procedure: LAPAROSCOPIC INGUINAL HERNIA;  Surgeon: Harl Bowie, MD;  Location: [place];  Service: General;  Laterality: Right;   INSERTION OF MESH Right 05/03/2013   Procedure: INSERTION OF MESH;  Surgeon: Harl Bowie, MD;  Location: [facility];  Service: General;  Laterality: Right;   INSERTION OF MESH Right 08/23/2013   Procedure: INSERTION OF MESH;  Surgeon: Harl Bowie, MD;  Location: [place];  Service: General;  Laterality: Right;   INSERTION OF MESH N/A 06/30/2022   Procedure: INSERTION OF MESH;  Surgeon: Ralene Ok, MD;  Location: [place];  Service: General;  Laterality: N/A;   METACARPOPHALANGEAL JOINT ARTHRODESIS  08/2015   with local bone graft   TONSILLECTOMY     TOTAL HIP ARTHROPLASTY  06/24/2012   Procedure: TOTAL HIP ARTHROPLASTY ANTERIOR APPROACH;  Surgeon: Mcarthur Rossetti, MD;  [facility];  Service: Orthopedics;  Laterality: Left;  Left total hip arthroplasty, anterior approach   UTERINE FIBROID SURGERY  2007   WISDOM TOOTH EXTRACTION     XI ROBOTIC ASSISTED HIATAL HERNIA REPAIR N/A 06/30/2022   Procedure: ROBOTIC HIATAL HERNIA AND FUNDOPLICATION;  Surgeon: Ralene Ok, MD;  Location: [place];  Service: General;  Laterality: N/A;    Family History  Problem Relation Age of Onset   Heart failure Mother    Osteoporosis Mother    Arthritis Mother    CAD Maternal Grandfather    Heart failure Maternal Grandfather    Dementia Maternal Grandfather    CAD Paternal Grandmother    CAD Paternal Grandfather    Arthritis Sister     Social History:  reports that she has never smoked. She has never used smokeless tobacco. She reports current alcohol use of about 2.0 standard drinks of alcohol per week. She reports that she does not use drugs.  Allergies:  Allergies  Allergen Reactions   Nsaids Other (See Comments)    Patient suffers from severe GI  bleeds.   Penicillins Swelling and Other (See Comments)    Tongue became swollen and turned black Did it involve swelling of the face/tongue/throat, SOB, or low BP? Yes Did it involve sudden or severe rash/hives, skin peeling, or any reaction on the inside of your mouth or nose? Yes Did you need to seek medical attention at a hospital or doctor's office? Yes When did it last happen? "I was a child"   If all above answers are "NO", may proceed with cephalosporin use.    Other     Bug bites-per patient, any bug that she has been bitten by will cause her anxiety, dermatitis, hives, itching, nausea and swelling.   Pork-Derived Products Other (See Comments)    Unknown reaction    Shellfish Allergy Other (See Comments)    Unknown reaction     Medications: I have reviewed the patient's current medications. Prior to Admission:  Medications Prior to Admission  Medication Sig Dispense Refill Last Dose  Ascorbic Acid (VITAMIN C) 1000 MG tablet Take 1,000 mg by mouth in the morning.   07/09/2022   cetirizine (ZYRTEC) 10 MG tablet Take 10 mg by mouth at bedtime.   07/10/2022   Cholecalciferol (VITAMIN D3) 50 MCG (2000 UT) capsule Take 4,000 Units by mouth in the morning.   07/09/2022   COLLAGEN PO Take 1 packet by mouth in the morning.   07/07/2022   cyanocobalamin 1000 MCG tablet Take 1,000 mcg by mouth in the morning.   07/09/2022   docusate sodium (COLACE) 100 MG capsule Take 1 capsule (100 mg total) by mouth daily as needed for mild constipation. 30 capsule 0 07/02/2022   HYDROcodone-acetaminophen (NORCO) 5-325 MG tablet Take 1.5 tablets by mouth every 4 (four) hours as needed for moderate pain. (Patient taking differently: Take 1 tablet by mouth every 4 (four) hours as needed for moderate pain.) 30 tablet 0 07/10/2022 at 0446   hypromellose (SYSTANE OVERNIGHT THERAPY) 0.3 % GEL ophthalmic ointment Place 1 Application into both eyes at bedtime.   07/10/2022   Menthol, Topical Analgesic, (BIOFREEZE) 4 % GEL  Apply 1 Application topically in the morning.   07/09/2022   metoprolol tartrate (LOPRESSOR) 25 MG tablet Take 0.5 tablets (12.5 mg total) by mouth 2 (two) times daily. (Patient taking differently: Take 12.5 mg by mouth 2 (two) times daily. Hold for heart rate <60 and/or SBP <95) 60 tablet 2 07/10/2022   Omega-3 1000 MG CAPS Take 1,000 mg by mouth in the morning.   07/09/2022   ondansetron (ZOFRAN-ODT) 4 MG disintegrating tablet Take 4 mg by mouth every 6 (six) hours as needed for nausea.   08/07/2022   pantoprazole (PROTONIX) 40 MG tablet Take 40 mg by mouth in the morning.   07/09/2022   Simethicone 125 MG CAPS Take 1 capsule by mouth every 8 (eight) hours as needed (For gas).   07/10/2022   VIIBRYD 40 MG TABS Take 40 mg by mouth in the morning.   07/09/2022   Propylene Glycol (SYSTANE BALANCE) 0.6 % SOLN Place 1 drop into both eyes as needed (For dry eyes).      Scheduled:  EPINEPHrine NaCl       Continuous:  sodium chloride     sodium chloride     esmolol 25 mcg/kg/min (07/30/2022 0902)   TDV:VOHYW/VPXTGGYI arterial line **AND** sodium chloride, acetaminophen **OR** acetaminophen, docusate sodium, EPINEPHrine NaCl, fentaNYL (SUBLIMAZE) injection, glycopyrrolate **OR** glycopyrrolate **OR** glycopyrrolate, ondansetron (ZOFRAN) IV, polyethylene glycol, polyvinyl alcohol Anti-infectives (From admission, onward)    None       Results for orders placed or performed during the hospital encounter of 07/23/2022 (from the past 48 hour(s))  CBC with Differential     Status: Abnormal   Collection Time: 07/22/2022  6:55 AM  Result Value Ref Range   WBC 19.4 (H) 4.0 - 10.5 K/uL   RBC 4.25 3.87 - 5.11 MIL/uL   Hemoglobin 14.2 12.0 - 15.0 g/dL   HCT 44.1 36.0 - 46.0 %   MCV 103.8 (H) 80.0 - 100.0 fL   MCH 33.4 26.0 - 34.0 pg   MCHC 32.2 30.0 - 36.0 g/dL   RDW 13.8 11.5 - 15.5 %   Platelets 201 150 - 400 K/uL   nRBC 0.0 0.0 - 0.2 %   Neutrophils Relative % 76 %   Neutro Abs 14.7 (H) 1.7 - 7.7 K/uL    Lymphocytes Relative 15 %   Lymphs Abs 2.8 0.7 - 4.0 K/uL   Monocytes Relative 7 %  Monocytes Absolute 1.3 (H) 0.1 - 1.0 K/uL   Eosinophils Relative 0 %   Eosinophils Absolute 0.1 0.0 - 0.5 K/uL   Basophils Relative 0 %   Basophils Absolute 0.1 0.0 - 0.1 K/uL   Immature Granulocytes 2 %   Abs Immature Granulocytes 0.43 (H) 0.00 - 0.07 K/uL    Comment: Performed at [facility] [address], [place], [place] [address]  Brain natriuretic peptide     Status: Abnormal   Collection Time: 07/24/2022  6:55 AM  Result Value Ref Range   B Natriuretic Peptide 441.2 (H) 0.0 - 100.0 pg/mL    Comment: Performed at [facility] [address], [place], [place] [address]  Lactic acid, plasma     Status: Abnormal   Collection Time: 07/12/2022  6:55 AM  Result Value Ref Range   Lactic Acid, Venous 8.6 (HH) 0.5 - 1.9 mmol/L    Comment: CRITICAL VALUE NOTED. VALUE IS CONSISTENT WITH PREVIOUSLY REPORTED/CALLED VALUE Performed at [facility], [place] [address], [place], [place] [address]   I-stat chem 8, ED (not at [facility] or [facility])     Status: Abnormal   Collection Time: 08/07/2022  7:01 AM  Result Value Ref Range   Sodium 136 135 - 145 mmol/L   Potassium 3.8 3.5 - 5.1 mmol/L   Chloride 104 98 - 111 mmol/L   BUN 9 8 - 23 mg/dL   Creatinine, Ser 0.80 0.44 - 1.00 mg/dL   Glucose, Bld 310 (H) 70 - 99 mg/dL    Comment: Glucose reference range applies only to samples taken after fasting for at least 8 hours.   Calcium, Ion 1.22 1.15 - 1.40 mmol/L   TCO2 18 (L) 22 - 32 mmol/L   Hemoglobin 15.6 (H) 12.0 - 15.0 g/dL   HCT 46.0 36.0 - 46.0 %  Blood culture (routine x 2)     Status: None (Preliminary result)   Collection Time: 07/10/2022  8:15 AM   Specimen: BLOOD  Result Value Ref Range   Specimen Description BLOOD    Special Requests      RIGHT FEMORAL Blood Culture adequate volume BOTTLES DRAWN AEROBIC AND ANAEROBIC   Culture      NO GROWTH <12 HOURS Performed at [facility],  [place] [address] [place], [place] [address]    Report Status PENDING   Type and screen     Status: None   Collection Time: 07/31/2022  8:15 AM  Result Value Ref Range   ABO/RH(D) O POS    Antibody Screen NEG    Sample Expiration      07/14/2022,2359 Performed at [facility], [place] [address] [place], [place] [address]   Basic metabolic panel     Status: Abnormal   Collection Time: 07/12/2022  8:15 AM  Result Value Ref Range   Sodium 130 (L) 135 - 145 mmol/L   Potassium 3.1 (L) 3.5 - 5.1 mmol/L   Chloride 100 98 - 111 mmol/L   CO2 20 (L) 22 - 32 mmol/L   Glucose, Bld 288 (H) 70 - 99 mg/dL    Comment: Glucose reference range applies only to samples taken after fasting for at least 8 hours.   BUN 9 8 - 23 mg/dL   Creatinine, Ser 1.25 (H) 0.44 - 1.00 mg/dL   Calcium 8.6 (L) 8.9 - 10.3 mg/dL   GFR, Estimated 44 (L) >60 mL/min    Comment: (NOTE) Calculated using the CKD-EPI Creatinine  Equation (2021)    Anion gap 10 5 - 15    Comment: Performed at [facility], [place] [address], [place], [place] [address]  Troponin I (High Sensitivity)     Status: Abnormal   Collection Time: 08/02/2022  8:15 AM  Result Value Ref Range   Troponin I (High Sensitivity) 77 (H) <18 ng/L    Comment: (NOTE) Elevated high sensitivity troponin I (hsTnI) values and significant  changes across serial measurements may suggest ACS but many other  chronic and acute conditions are known to elevate hsTnI results.  Refer to the "Links" section for chest pain algorithms and additional  guidance. Performed at [facility], [place] [address] [place], [place] [address]   Lactic acid, plasma     Status: Abnormal   Collection Time: 08/02/2022  8:30 AM  Result Value Ref Range   Lactic Acid, Venous 8.1 (HH) 0.5 - 1.9 mmol/L    Comment: CRITICAL RESULT CALLED TO, READ BACK BY AND VERIFIED WITH E. BANKS RN 07/11/22 '@0930'$  BY J. WHITE Performed at [facility] [address] [place], [place] [address]   SARS  Coronavirus 2 by RT PCR (hospital order, performed in [facility] hospital lab) *cepheid single result test* Anterior Nasal Swab     Status: None   Collection Time: 08/07/2022  8:32 AM   Specimen: Anterior Nasal Swab  Result Value Ref Range   SARS Coronavirus 2 by RT PCR NEGATIVE NEGATIVE    Comment: (NOTE) SARS-CoV-2 target nucleic acids are NOT DETECTED.  The SARS-CoV-2 RNA is generally detectable in upper and lower respiratory specimens during the acute phase of infection. The lowest concentration of SARS-CoV-2 viral copies this assay can detect is 250 copies / mL. A negative result does not preclude SARS-CoV-2 infection and should not be used as the sole basis for treatment or other patient management decisions.  A negative result may occur with improper specimen collection / handling, submission of specimen other than nasopharyngeal swab, presence of viral mutation(s) within the areas targeted by this assay, and inadequate number of viral copies (<250 copies / mL). A negative result must be combined with clinical observations, patient history, and epidemiological information.  Fact Sheet for Patients:   https://www.patel.info/  Fact Sheet for Healthcare Providers: https://hall.com/  This test is not yet approved or  cleared by the [place] FDA and has been authorized for detection and/or diagnosis of SARS-CoV-2 by FDA under an Emergency Use Authorization (EUA).  This EUA will remain in effect (meaning this test can be used) for the duration of the COVID-19 declaration under Section 564(b)(1) of the Act, 21 U.S.C. section 360bbb-3(b)(1), unless the authorization is terminated or revoked sooner.  Performed at [facility], [place] [address] [place], [place] [address]   Urinalysis, Routine w reflex microscopic Anterior Nasal Swab     Status: Abnormal   Collection Time: 07/21/2022  8:35 AM  Result Value Ref Range   Color, Urine  YELLOW YELLOW   APPearance HAZY (A) CLEAR   Specific Gravity, Urine 1.037 (H) 1.005 - 1.030   pH 8.0 5.0 - 8.0   Glucose, UA 150 (A) NEGATIVE mg/dL   Hgb urine dipstick NEGATIVE NEGATIVE   Bilirubin Urine NEGATIVE NEGATIVE   Ketones, ur NEGATIVE NEGATIVE mg/dL   Protein, ur >=300 (A) NEGATIVE mg/dL   Nitrite NEGATIVE NEGATIVE   Leukocytes,Ua NEGATIVE NEGATIVE   RBC / HPF 0-5 0 - 5 RBC/hpf   WBC, UA 0-5 0 -  5 WBC/hpf   Bacteria, UA RARE (A) NONE SEEN   Squamous Epithelial / LPF 0-5 0 - 5   Mucus PRESENT     Comment: Performed at [facility], [place] [address] [place], [place] [address]    CT ABDOMEN PELVIS W CONTRAST  Result Date: 07/13/2022 CLINICAL DATA:  Sepsis status post surgery. EXAM: CT ABDOMEN AND PELVIS WITH CONTRAST TECHNIQUE: Multidetector CT imaging of the abdomen and pelvis was performed using the standard protocol following bolus administration of intravenous contrast. RADIATION DOSE REDUCTION: This exam was performed according to the departmental dose-optimization program which includes automated exposure control, adjustment of the mA and/or kV according to patient size and/or use of iterative reconstruction technique. CONTRAST:  154m OMNIPAQUE IOHEXOL 350 MG/ML SOLN COMPARISON:  02/17/2018 FINDINGS: Lower chest: See separate CT angio chest report from today. Hepatobiliary: There is a heterogeneous attenuation of the liver parenchyma. Focal peripheral area of low density within the anterior aspect of the lateral segment of left lobe of liver is noted measuring 4.8 x 3.0 cm, image 16/4. Although nonspecific differential considerations include hepatic infarct versus focal fatty deposition. There is marked diffuse gallbladder wall edema with wall thickness measuring 1.3 cm, image 35/4. No biliary dilatation. Pancreas: Unremarkable. No pancreatic ductal dilatation or surrounding inflammatory changes. Spleen: Normal in size without focal abnormality. Adrenals/Urinary Tract: Adrenal  glands are unremarkable. Kidneys are normal, without renal calculi, focal lesion, or hydronephrosis. Bladder decompressed around a Foley catheter. Stomach/Bowel: The stomach is diffusely distended. There are postoperative changes from recent hiatal hernia repair. No bowel wall thickening, inflammation, or distension. Moderate retained stool noted within the rectum. Vascular/Lymphatic: Continuation of thoracic aortic dissection into the abdominal aorta. The dissection a extends beyond the bifurcation into bilateral common iliac arteries. The left common iliac artery dissection terminates proximal to the bifurcation, image 51/4. The right common iliac artery dissection flap extends into the right external iliac artery and terminates proximal to the right common femoral artery, image 72/4. The right internal iliac artery arises off the false lumen, image 60 and image 61 of series 4. The celiac artery, superior mesenteric artery, and bilateral renal arteries appear to arise off the false lumen, image 27/4, image 29/4, and image 30/4. The inferior mesenteric artery also appears to arise off the false lumen. The portal vein, portal venous confluence and SMV all appear patent and perfused. Splenic vein is also patent. Right femoral approach central venous catheter is noted which terminates in the distal external iliac vein. No abdominopelvic adenopathy. Reproductive: Uterus and bilateral adnexa are unremarkable. Other: Right upper quadrant edema centered around the gallbladder is noted. Trace free fluid noted within the dependent portion of the pelvis. Scattered foci of pneumoperitoneum identified which may be sequelae of recent robotic assisted hiatal hernia repair. Musculoskeletal: No acute or significant osseous findings. IMPRESSION: 1. Continuation of thoracic aortic dissection into the abdominal aorta. The dissection flap extends beyond the aortic bifurcation into bilateral common iliac arteries. 2. The right common  iliac dissection flap extends into the right external iliac artery and terminates proximal to the right common femoral artery. The right internal iliac artery arises off the false lumen. The left common iliac dissection flap terminates proximal to the bifurcation. 3. The celiac artery, superior mesenteric artery, bilateral renal arteries, and inferior mesenteric artery arise off the false lumen. 4. There is marked diffuse edema involving the gallbladder. This is of uncertain clinical significance. Differential considerations include acute cholecystitis, or gallbladder wall edema secondary to heart failure.  Gallbladder ischemia secondary to dissection would be another potential diagnostic consideration. 5. Heterogeneous enhancement of the liver parenchyma is identified. Within the lateral segment of left hepatic lobe there is a wedge-shaped area of more focal hypoenhancement which is of uncertain significance. This may simply reflect altered perfusion secondary to dissection versus focal fatty deposition versus focal infarct. 6. Postoperative changes from recent hiatal hernia repair. Scattered foci of pneumoperitoneum identified which may be sequelae of recent robotic assisted hiatal hernia repair. Critical Value/emergent results were called by telephone at the time of interpretation on 08/01/2022 at 9:11 am to provider Dorie Rank, MD, who verbally acknowledged these results. Electronically Signed   By: Kerby Moors M.D.   On: 07/10/2022 09:12   DG Chest Port 1 View  Result Date: 08/01/2022 CLINICAL DATA:  Shortness of breath. EXAM: PORTABLE CHEST 1 VIEW COMPARISON:  July 02, 2022. FINDINGS: Stable cardiomegaly. Right lung is clear. Left basilar atelectasis or infiltrate is noted with associated pleural effusion. Bony thorax is unremarkable. IMPRESSION: Stable left basilar atelectasis or infiltrate with small pleural effusion. Electronically Signed   By: Marijo Conception M.D.   On: 08/06/2022 09:05   CT ANGIO  CHEST AORTA W/CM &/OR WO/CM  Result Date: 07/26/2022 CLINICAL DATA:  Evaluate for aortic dissection. EXAM: CT ANGIOGRAPHY CHEST WITH CONTRAST TECHNIQUE: Multidetector CT imaging of the chest was performed using the standard protocol during bolus administration of intravenous contrast. Multiplanar CT image reconstructions and MIPs were obtained to evaluate the vascular anatomy. RADIATION DOSE REDUCTION: This exam was performed according to the departmental dose-optimization program which includes automated exposure control, adjustment of the mA and/or kV according to patient size and/or use of iterative reconstruction technique. CONTRAST:  56m OMNIPAQUE IOHEXOL 350 MG/ML SOLN COMPARISON:  02/17/2018 FINDINGS: Cardiovascular: The heart size appears normal. There is a moderate pericardial fluid collection measuring 54 Hounsfield units. Hemopericardium cannot be excluded. Ascending thoracic aortic aneurysm measures 4.6 cm, image 52/8. There is a DeBakey type 1, Stanford type A dissection which originates at the aortic root and extends into the abdominal aorta. The great vessels arise off the true lumen of the aorta. Mediastinum/Nodes: Patulous esophagus compatible with chronic dysmotility. Postoperative changes from hiatal hernia repair with ex vacuo fluid collection surrounding the distal esophagus. The trachea and thyroid gland appear normal. No adenopathy. Lungs/Pleura: Small to moderate bilateral pleural effusions. Ground-glass attenuation and interlobular septal thickening is identified compatible with pulmonary edema. Bibasilar subsegmental atelectasis. Upper Abdomen: See separate abdominal CT report from 08/04/2022. Musculoskeletal: No acute abnormality. Review of the MIP images confirms the above findings. IMPRESSION: 1. Examination is positive for Stanford type A dissection. 2. Moderate pericardial fluid collection measuring 55 Hounsfield units. Hemopericardium cannot be excluded. 3. Small to moderate  bilateral pleural effusions and pulmonary edema compatible with CHF. Critical Value/emergent results were called by telephone at the time of interpretation on 07/29/2022 at 8:52 am to provider JDorie Rank MD , who verbally acknowledged these results. Electronically Signed   By: TKerby MoorsM.D.   On: 07/19/2022 08:53   CT Angio Chest PE W and/or Wo Contrast  Result Date: 08/01/2022 CLINICAL DATA:  Rule out pulmonary embolism. EXAM: CT ANGIOGRAPHY CHEST WITH CONTRAST TECHNIQUE: Multidetector CT imaging of the chest was performed using the standard protocol during bolus administration of intravenous contrast. Multiplanar CT image reconstructions and MIPs were obtained to evaluate the vascular anatomy. RADIATION DOSE REDUCTION: This exam was performed according to the departmental dose-optimization program which includes automated exposure control, adjustment of the  mA and/or kV according to patient size and/or use of iterative reconstruction technique. CONTRAST:  145m OMNIPAQUE IOHEXOL 350 MG/ML SOLN COMPARISON:  02/17/2018 FINDINGS: Cardiovascular: Satisfactory opacification of the pulmonary arteries to the segmental level. No evidence of pulmonary embolism. Heart size appears normal. There is a moderate pericardial fluid collection which measures 55 Hounsfield units, image 86/4. Suspicious for hemopericardium. There is prominent reflux of contrast material from the right ventricle into the IVC and hepatic veins which is compatible with passive venous congestion suggestive of right heart failure. Mediastinum/Nodes: Thyroid gland, trachea, and esophagus are unremarkable. No enlarged lymph nodes. The proximal and mid esophagus appear diffusely patulous compatible with chronic dysmotility. Status post repair of previous large hiatal hernia with ex vacuo fluid collection surrounding the distal esophagus measuring 7.4 x 3.5 by 8.7 cm. Lungs/Pleura: Small to moderate bilateral pleural effusions identified. Bilateral  interlobular septal thickening and ground-glass attenuation is noted compatible with pulmonary edema. Atelectasis noted within both lung bases. Upper Abdomen: Sub optimal visualization of the upper abdomen due to phase of contrast opacification. Postoperative changes from hiatal hernia repair noted. Musculoskeletal: No chest wall abnormality. No acute or significant osseous findings. Review of the MIP images confirms the above findings. IMPRESSION: 1. No evidence for acute pulmonary embolus. 2. Moderate, intermediate to high attenuation pericardial fluid collection is identified worrisome for hemopericardium. 3. Reflux of contrast material into the IVC and hepatic veins compatible with right heart failure. 4. Small to moderate bilateral pleural effusions and pulmonary edema compatible with CHF. 5. Status post repair of previous large hiatal hernia with ex vacuo fluid collection surrounding the distal esophagus. Electronically Signed   By: TKerby MoorsM.D.   On: 07/29/2022 08:46    Review of Systems  Unable to perform ROS: Mental status change   Blood pressure (!) 85/70, pulse 67, temperature 99.8 F (37.7 C), resp. rate (!) 31, SpO2 92 %. Physical Exam Constitutional:      General: She is in acute distress.  Eyes:     Pupils: Pupils are equal, round, and reactive to light.  Cardiovascular:     Rate and Rhythm: Normal rate and regular rhythm.     Heart sounds: Normal heart sounds. No murmur heard. Pulmonary:     Effort: Tachypnea and respiratory distress present.  Abdominal:     General: Bowel sounds are normal.     Palpations: Abdomen is soft.     Tenderness: There is no abdominal tenderness.  Musculoskeletal:        General: Swelling present.  Skin:    Comments: cool  Neurological:     Comments: Opens eyes to voice and tries to answer simple questions but has difficulty getting out words.  Does not move right upper extremity.  Weakness in right lower extremity.  Moving left upper and  lower extremity well.  Follows commands with some difficulty.    Narrative & Impression  CLINICAL DATA:  Evaluate for aortic dissection.   EXAM: CT ANGIOGRAPHY CHEST WITH CONTRAST   TECHNIQUE: Multidetector CT imaging of the chest was performed using the standard protocol during bolus administration of intravenous contrast. Multiplanar CT image reconstructions and MIPs were obtained to evaluate the vascular anatomy.   RADIATION DOSE REDUCTION: This exam was performed according to the departmental dose-optimization program which includes automated exposure control, adjustment of the mA and/or kV according to patient size and/or use of iterative reconstruction technique.   CONTRAST:  731mOMNIPAQUE IOHEXOL 350 MG/ML SOLN   COMPARISON:  02/17/2018  FINDINGS: Cardiovascular: The heart size appears normal. There is a moderate pericardial fluid collection measuring 54 Hounsfield units. Hemopericardium cannot be excluded.   Ascending thoracic aortic aneurysm measures 4.6 cm, image 52/8. There is a DeBakey type 1, Stanford type A dissection which originates at the aortic root and extends into the abdominal aorta. The great vessels arise off the true lumen of the aorta.   Mediastinum/Nodes: Patulous esophagus compatible with chronic dysmotility. Postoperative changes from hiatal hernia repair with ex vacuo fluid collection surrounding the distal esophagus. The trachea and thyroid gland appear normal. No adenopathy.   Lungs/Pleura: Small to moderate bilateral pleural effusions. Ground-glass attenuation and interlobular septal thickening is identified compatible with pulmonary edema. Bibasilar subsegmental atelectasis.   Upper Abdomen: See separate abdominal CT report from 07/22/2022.   Musculoskeletal: No acute abnormality.   Review of the MIP images confirms the above findings.   IMPRESSION: 1. Examination is positive for Stanford type A dissection. 2. Moderate pericardial  fluid collection measuring 55 Hounsfield units. Hemopericardium cannot be excluded. 3. Small to moderate bilateral pleural effusions and pulmonary edema compatible with CHF.   Critical Value/emergent results were called by telephone at the time of interpretation on 08/06/2022 at 8:52 am to provider Dorie Rank, MD , who verbally acknowledged these results.     Electronically Signed   By: Kerby Moors M.D.   On: 07/18/2022 08:53    Assessment/Plan:  This 76 year old woman has an acute type A aortic dissection with hemopericardium suggesting rupture presenting with cardiogenic shock and pulmonary edema on chest x-ray.  She is tachypneic but maintaining oxygen saturations.  She has signs of right hemiparesis and altered mental status suggesting that she probably had emboli from the dissection to her brain.  The great vessels all appear to be coming off the true lumen and are patent.  I think she is a poor operative candidate given her presentation in extremis with pre-existing morbid obesity, right hemiparesis suggesting stroke, reduced level of functioning due to her recent surgery, and advanced age.  I think the likelihood that she will get through surgery and recover to be independent and functional is extremely low and I would recommend comfort care only. Her operative mortality alone is probably 50% but if she got through the surgery her risk of major morbidity including stroke, renal failure, respiratory failure and prolonged mechanical ventilation would be much higher.  I discussed all this with the patient's 3 children by telephone who all live out of town but are on their way to the hospital.  They have talked about it among themselves and agree that comfort care would be the best treatment for her.  Gaye Pollack 08/08/2022, 11:50 AM

## 2022-07-11 NOTE — ED Provider Notes (Signed)
Pt initially seen by Dr Sedonia Small and PA Rona Ravens.  Patient is able to look at me answer a few questions but not able to provide any history.  She demonstrates towards having to some pain in her abdominal area.  Physical Exam  BP (!) 85/70   Pulse 73   Temp 99.9 F (37.7 C)   Resp (!) 21   SpO2 94%   Physical Exam   Procedures  .Critical Care  Performed by: Dorie Rank, MD Authorized by: Dorie Rank, MD   Critical care provider statement:    Critical care time (minutes):  45   Critical care was time spent personally by me on the following activities:  Development of treatment plan with patient or surrogate, discussions with consultants, evaluation of patient's response to treatment, examination of patient, ordering and review of laboratory studies, ordering and review of radiographic studies, ordering and performing treatments and interventions, pulse oximetry, re-evaluation of patient's condition and review of old charts   ED Course / MDM   Clinical Course as of 07/20/2022 1027  Sat Jul 11, 2022  0740 BP is improving.  Mental status improving but still not at baseline.  Continue to monitor closely.  Proceed with CT [JK]  0800 Preliminary review of CT suggests aortic dissection.  Will adjust BP parameters.   [JK]  [id number] Reviewed CT scan findings with Dr. Clovis Riley.  Patient has a type a dissection.  She does have a pericardial effusion.  Evidence of severe right heart strain. [JK]  9381 Patient's mental status is back to baseline.  She is alert and awake and answering questions appropriately.  I discussed the findings of her patient with her.  Explaining the severity of her illness and the life-threatening nature of her dissection. [JK]  1014 Case discussed with critical care.  Will evaluate pt.  OR contacted to get an update, Dr Cyndia Bent has finished the case will be down shortly. [JK]  1026 Dr Cyndia Bent is at the bedside. Will contact family regarding plan [JK]    Clinical Course User Index [JK]  Dorie Rank, MD   Medical Decision Making Problems Addressed: Ascending aortic dissection [facility]): acute illness or injury that poses a threat to life or bodily functions Pericardial effusion: acute illness or injury that poses a threat to life or bodily functions  Amount and/or Complexity of Data Reviewed Labs: ordered. Radiology: ordered and independent interpretation performed. ECG/medicine tests: ordered.  Risk Prescription drug management. Decision regarding hospitalization.   Pt found to have an ascending aortic dissection with pericardial effusion.  High risk of morbidity/mortality.  Cardiothoracic surgery consulted, Dr Cyndia Bent in the [place].  Notified nurse in OR x 2.   Pt initially on pressors for her shock presentation.  BP has improved.  EPI drip stopped.  Pt started on esmolol for BP and heart rate control.  Family notified.  Continue to monitor closely.  Will consult with ICU       Dorie Rank, MD 07/20/2022 1027

## 2022-07-11 NOTE — ED Triage Notes (Signed)
Pt arrived via [facility] from [facility]. Pt had abdominal surgery a week ago. Pt became weak ad SOB at 0100 today. Pt is lethargic, tachypneic and hypotensive on arrival. Pt's skin is cool and clammy. MD at bedside

## 2022-07-11 NOTE — ED Notes (Signed)
Patient transported to CT 

## 2022-07-11 NOTE — ED Notes (Signed)
ED provider at bedside.

## 2022-07-11 NOTE — ED Provider Notes (Signed)
.  Critical Care  Performed by: Maudie Flakes, MD Authorized by: Maudie Flakes, MD   Critical care provider statement:    Critical care time (minutes):  35   Critical care was necessary to treat or prevent imminent or life-threatening deterioration of the following conditions:  Sepsis   Critical care was time spent personally by me on the following activities:  Development of treatment plan with patient or surrogate, discussions with consultants, evaluation of patient's response to treatment, examination of patient, ordering and review of laboratory studies, ordering and review of radiographic studies, ordering and performing treatments and interventions, pulse oximetry, re-evaluation of patient's condition and review of old charts .Central Line  Date/Time: 07/27/2022 7:21 AM  Performed by: Maudie Flakes, MD Authorized by: Maudie Flakes, MD   Consent:    Consent obtained:  Emergent situation Universal protocol:    Patient identity confirmed:  Arm band Pre-procedure details:    Indication(s): central venous access     Skin preparation:  Chlorhexidine Procedure details:    Location:  R femoral   Patient position:  Supine   Procedural supplies:  Triple lumen   Catheter size:  7 Fr   Landmarks identified: yes     Ultrasound guidance: yes     Ultrasound guidance timing: real time     Number of attempts:  2   Successful placement: yes   Post-procedure details:    Post-procedure:  Dressing applied and line sutured   Assessment:  Blood return through all ports and free fluid flow   Procedure completion:  Tolerated well, no immediate complications Comments:     Patient's blood pressure and clinical status deteriorating with no IV access and so this central line placed with aseptic technique but not fully sterile.  Recommend removal or replacement in 24 to 48 hours.     Maudie Flakes, MD 08/03/2022 [phone]

## 2022-07-11 NOTE — ED Notes (Signed)
Spoke with patients son Shanon Brow.  Updates on patients condition and aware awaiting final CT results.

## 2022-07-11 NOTE — ED Notes (Signed)
Patient returned from CT

## 2022-07-11 NOTE — ED Notes (Signed)
Patient transported to CT by RN.

## 2022-07-11 NOTE — Procedures (Signed)
Arterial Catheter Insertion Procedure Note  Molly Lucas [facility]  [id number]  Jun 23, 1946  Date:07/15/2022  Time:8:49 AM    Provider Performing: Esperanza Sheets T    Procedure: Insertion of Arterial Line [phone]) without US guidance  Indication(s) Blood pressure monitoring and/or need for frequent ABGs  Consent Risks of the procedure as well as the alternatives and risks of each were explained to the patient and/or caregiver.  Consent for the procedure was obtained and is signed in the bedside chart  Anesthesia None   Time Out Verified patient identification, verified procedure, site/side was marked, verified correct patient position, special equipment/implants available, medications/allergies/relevant history reviewed, required imaging and test results available.   Sterile Technique Maximal sterile technique including full sterile barrier drape, hand hygiene, sterile gown, sterile gloves, mask, hair covering, sterile ultrasound probe cover (if used).   Procedure Description Area of catheter insertion was cleaned with chlorhexidine and draped in sterile fashion. Without real-time ultrasound guidance an arterial catheter was placed into the right radial artery.  Appropriate arterial tracings confirmed on monitor.     Complications/Tolerance None; patient tolerated the procedure well.   EBL Minimal   Specimen(s) None

## 2022-07-11 NOTE — ED Triage Notes (Signed)
BIB [facility] from [facility] recent abd surgery

## 2022-07-12 DIAGNOSIS — A419 Sepsis, unspecified organism: Secondary | ICD-10-CM

## 2022-07-12 LAB — BASIC METABOLIC PANEL
Anion gap: 10 (ref 5–15)
BUN: 29 mg/dL — ABNORMAL HIGH (ref 8–23)
CO2: 19 mmol/L — ABNORMAL LOW (ref 22–32)
Calcium: 9.6 mg/dL (ref 8.9–10.3)
Chloride: 103 mmol/L (ref 98–111)
Creatinine, Ser: 2.07 mg/dL — ABNORMAL HIGH (ref 0.44–1.00)
GFR, Estimated: 24 mL/min — ABNORMAL LOW (ref >60)
Glucose, Bld: 153 mg/dL — ABNORMAL HIGH (ref 70–99)
Potassium: 5 mmol/L (ref 3.5–5.1)
Sodium: 132 mmol/L — ABNORMAL LOW (ref 135–145)

## 2022-07-12 LAB — CBC
HCT: 41.5 % (ref 36.0–46.0)
Hemoglobin: 14.6 g/dL (ref 12.0–15.0)
MCH: 34.2 pg — ABNORMAL HIGH (ref 26.0–34.0)
MCHC: 35.2 g/dL (ref 30.0–36.0)
MCV: 97.2 fL (ref 80.0–100.0)
Platelets: 154 10*3/uL (ref 150–400)
RBC: 4.27 MIL/uL (ref 3.87–5.11)
RDW: 13.9 % (ref 11.5–15.5)
WBC: 19.3 10*3/uL — ABNORMAL HIGH (ref 4.0–10.5)
nRBC: 0 % (ref 0.0–0.2)

## 2022-07-12 MED ORDER — LORAZEPAM 2 MG/ML IJ SOLN
1.0000 mg | INTRAMUSCULAR | Status: DC | PRN
  Administered 2022-07-12: 1 mg via INTRAVENOUS
  Administered 2022-07-12 – 2022-07-13 (×2): 2 mg via INTRAVENOUS
  Administered 2022-07-13: 1 mg via INTRAVENOUS
  Administered 2022-07-13 – 2022-07-14 (×2): 2 mg via INTRAVENOUS
  Filled 2022-07-12 (×7): qty 1

## 2022-07-12 MED ORDER — MORPHINE SULFATE (PF) 2 MG/ML IV SOLN
2.0000 mg | INTRAVENOUS | Status: DC | PRN
  Administered 2022-07-12 (×3): 2 mg via INTRAVENOUS
  Administered 2022-07-12: 4 mg via INTRAVENOUS
  Administered 2022-07-12 – 2022-07-13 (×7): 2 mg via INTRAVENOUS
  Filled 2022-07-12: qty 1
  Filled 2022-07-12: qty 2
  Filled 2022-07-12 (×9): qty 1

## 2022-07-12 NOTE — Progress Notes (Signed)
Family is very supportive and at bedside. Ativan 1 mg given earlier with good results. Pt repositioned for comfort.

## 2022-07-12 NOTE — H&P (Signed)
NAME:  Molly Lucas, MRN:  [id number], DOB:  June 29, 1946, LOS: 1 ADMISSION DATE:  07/25/2022, CONSULTATION DATE:  07/12/2022  REFERRING MD:  Blair Dolphin, CHIEF COMPLAINT:  aortic dissection.    History of Present Illness:  76 year old woman who presented with SOB from STR facility where she was convalescing following robotic hiatal hernia repair by Dr. Rosendo Gros on 06/30/2022 Presented in shock.  Code sepsis was initiated and she was given a liter of fluids and started on epinephrine which corrected initial shock.  Initially thought to have suffered PE but CT PE showed pericardial effusion and suspicion of dissection. Type A dissection to level of iliacs was confirmed by dedicated dissection study.   TCTS consulted.   Developed right hemiparesis concerning for embolic stroke from dissection.   Poor surgical candidate with high mortality risk.  After discussion, patient was made a DNR and decision made to proceed with comfort care.   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 Pt states it was a very brief period of high blood pressure due a stressful time in 2010   OA (osteoarthritis) of hip    left   Sleep apnea    dentist made mouthipiece    Past Surgical History:  Procedure Laterality Date   BIOPSY  03/30/2019   Procedure: BIOPSY;  Surgeon: Thornton Park, MD;  Location: [place];  Service: Gastroenterology;;   [place]  08/2015   with abductor pollicis longus tendon transfer and suspensionplasty   COLONOSCOPY  WITH PROPOFOL N/A 03/31/2019   Procedure: COLONOSCOPY WITH PROPOFOL;  Surgeon: Thornton Park, MD;  Location: [place];  Service: Gastroenterology;  Laterality: N/A;   DE QUERVAIN'S RELEASE  08/2015   DILATATION & CURRETTAGE/HYSTEROSCOPY WITH RESECTOCOPE  2007   ESOPHAGOGASTRODUODENOSCOPY N/A 11/30/2015   Procedure: ESOPHAGOGASTRODUODENOSCOPY (EGD);  Surgeon: Richmond Campbell, MD;  Location[facility];  Service: Endoscopy;  Laterality: N/A;   ESOPHAGOGASTRODUODENOSCOPY (EGD) WITH PROPOFOL N/A 03/30/2019   Procedure: ESOPHAGOGASTRODUODENOSCOPY (EGD) WITH PROPOFOL;  Surgeon: Thornton Park, MD;  Location: [place];  Service: Gastroenterology;  Laterality: N/A;   ESOPHAGOGASTRODUODENOSCOPY (EGD) WITH PROPOFOL N/A 12/31/2020   Procedure: ESOPHAGOGASTRODUODENOSCOPY (EGD) WITH PROPOFOL;  Surgeon: Irene Shipper, MD;  Location: [facility];  Service: Endoscopy;  Laterality: N/A;   ESOPHAGOGASTRODUODENOSCOPY (EGD) WITH PROPOFOL N/A 11/04/2021   Procedure: ESOPHAGOGASTRODUODENOSCOPY (EGD) WITH PROPOFOL;  Surgeon: Ronnette Juniper, MD;  Location: [facility];  Service: Gastroenterology;  Laterality: N/A;   HERNIA REPAIR Left 06/2012   INGUINAL HERNIA REPAIR Right 05/03/2013   Procedure: HERNIA REPAIR INGUINAL ADULT;  Surgeon: Harl Bowie, MD;  Location: [place];  Service: General;  Laterality: Right;   INGUINAL HERNIA REPAIR Right 08/23/2013   Procedure: LAPAROSCOPIC INGUINAL HERNIA;  Surgeon: Harl Bowie, MD;  Location: [place];  Service: General;  Laterality: Right;   INSERTION OF MESH Right 05/03/2013   Procedure: INSERTION OF MESH;  Surgeon: Harl Bowie, MD;  Location: [place];  Service: General;  Laterality: Right;   INSERTION OF MESH Right 08/23/2013   Procedure: INSERTION OF MESH;  Surgeon: Harl Bowie, MD;  Location: [place];  Service: General;  Laterality: Right;   INSERTION OF MESH N/A 06/30/2022   Procedure: INSERTION OF MESH;  Surgeon: Ralene Ok, MD;  Location:  [place];  Service: General;  Laterality: N/A;   METACARPOPHALANGEAL JOINT ARTHRODESIS  08/2015   with local bone graft   TONSILLECTOMY     TOTAL HIP ARTHROPLASTY  06/24/2012   Procedure: TOTAL HIP ARTHROPLASTY ANTERIOR APPROACH;  Surgeon: Mcarthur Rossetti, MD;  Location: [facility];  Service: Orthopedics;  Laterality: Left;  Left total hip arthroplasty, anterior approach   UTERINE FIBROID SURGERY  2007   WISDOM TOOTH EXTRACTION     XI ROBOTIC ASSISTED HIATAL HERNIA REPAIR N/A 06/30/2022   Procedure: ROBOTIC HIATAL HERNIA AND FUNDOPLICATION;  Surgeon: Ralene Ok, MD;  Location: [place];  Service: General;  Laterality: N/A;    Significant Hospital Events: Including procedures, antibiotic start and stop dates in addition to other pertinent events   9/2 presented in shock from dissection, seen by Dr Cyndia Bent who doesn't feel she is a suitable surgical candidate.   Interim History / Subjective:  Concern for uncontrolled pain overnight, fentanyl changed to morphine.    Objective   Blood pressure (!) 85/70, pulse (!) 134, temperature 99.8 F (37.7 C), resp. rate (!) 23, SpO2 (!) 89 %.        Intake/Output Summary (Last 24 hours) at 07/12/2022 1003 Last data filed at 07/12/2022 0800 Gross per 24 hour  Intake 495.94 ml  Output 90 ml  Net 405.94 ml   There were no vitals filed for this visit.  Examination: General:  Elderly female lying in bed in NAD HEENT: MM pink/dry Neuro:  Will awake and respond to family at bedside, mostly non verbal, moving LUE, no movement seen on RUE or LE CV: rr, distant heart sounds PULM:  non labored, clear and diminished, slight cough with ice chips  Extremities: warm/dry, RLE> LLE edema  Foley in place  Ancillary tests personally reviewed:   CT angio shows extensive type A dissection with pericardial effusion.   Assessment & Plan:  Acute type A aortic dissection with cardiogenic shock from pericardial tamponade  AKI from shock and possible renal artery  involvement Right hemiparesis likely due to left carotid involvement.  P:  Continue full comfort care D/c aline.  Keep femoral access for access.  Transfer to palliative floor  No further labs Continue prn morphine for pain, may need to move to gtt at some point. Prn ativan, robinul, tylenol  Will avoid ice chips and move to mouth swabs Family inquired about transfer to hospice but given her SBP in the 70's, felt too high risk and could worsen her discomfort.  Anticipate hospital passing.  Ongoing family support> all questions answered.     Will sign out to [facility] to pick up 9/4.    Best Practice (right click and "Reselect all SmartList Selections" daily)   Diet/type: NPO DVT prophylaxis: not indicated GI prophylaxis: N/A Lines: Central line and Arterial Line> d/c aline Foley:  Yes, and it is still needed> comfort  Code Status:  DNR Last date of multidisciplinary goals of care discussion: 9/2      Kennieth Rad, MSN, AG-ACNP-BC [facility] 07/12/2022, 10:22 AM  See Amion for pager If no response to pager,  please call PCCM consult pager After 7:00 pm call [place]

## 2022-07-12 NOTE — Progress Notes (Signed)
eLink Physician-Brief Progress Note Patient Name: Molly Lucas DOB: November 17, 1945 MRN: [id number]   Date of Service  07/12/2022  HPI/Events of Note  Patient comfort care Having more frequent pain despite PRN fentanyl  eICU Interventions  PRN morphine ordered     Intervention Category Intermediate Interventions: Pain - evaluation and management  Khyla Mccumbers Rodman Pickle 07/12/2022, 3:06 AM

## 2022-07-12 NOTE — Progress Notes (Signed)
Pt given MSO4 2 mg IV for pain, family at bedside.

## 2022-07-12 NOTE — Progress Notes (Signed)
Pt admitted to room 6N20 from ICU for comfort care with family at bedside.

## 2022-07-13 DIAGNOSIS — Z515 Encounter for palliative care: Secondary | ICD-10-CM | POA: Diagnosis not present

## 2022-07-13 DIAGNOSIS — I7100 Dissection of unspecified site of aorta: Secondary | ICD-10-CM | POA: Diagnosis not present

## 2022-07-13 MED ORDER — MORPHINE 100MG IN NS 100ML (1MG/ML) PREMIX INFUSION
1.0000 mg/h | INTRAVENOUS | Status: DC
  Administered 2022-07-14: 1 mg/h via INTRAVENOUS
  Filled 2022-07-13: qty 100

## 2022-07-13 NOTE — Progress Notes (Signed)
Family at bed side,pain medication given,patient repositioned,pulse palpable,warm to touch,will continue to monitor.

## 2022-07-13 NOTE — Progress Notes (Signed)
PROGRESS NOTE    Molly Lucas  TOI:[id number] DOB: 20-Jul-1946 DOA: 08/03/2022 PCP: Donnajean Lopes, MD   Brief Narrative:  Patient is a 76 year old obese Caucasian female with a past medical history significant for but not limited to depression and anxiety, anemia, constipation, history of uterine fibroids, GERD, history of seasonal allergies, hypertension, hyperlipidemia, osteoarthritis of the left hip, sleep apnea as well as other comorbidities who presented to the hospital with shortness of breath from her STR facility where she was convalescing following robotic hiatal hernia repair by Dr. Rosendo Gros on 06/30/2022.  She presented in shock and code sepsis was initiated she was given a liter of IV fluids and started on epinephrine which corrected initial shock.  She was initially thought to have suffered a PE but the chest PE protocol showed a pericardial effusion and suspicion of dissection.  She underwent a dedicated dissection study at which showed a type a dissection to the level of the iliacs and cardiothoracic surgery was consulted.  She developed right hemiparesis concerning for embolic stroke from the dissection.  She is a poor surgical candidate with high mortality risk and after discussion the patient was made DNR and decision was made to proceed with comfort care.   Assessment and Plan: No notes have been filed under this hospital service. Service: Hospitalist  Acute type A aortic dissection with cardiogenic shock from pericardial tamponade  AKI from shock and possible renal artery involvement Right hemiparesis likely due to left carotid involvement.  Leukocytosis Metabolic Acidosis Obesity  -She required esmolol and pressors previously -Continue full comfort care -Cardiothoracic surgery was consulted and they discussed patient's clinical course and she is a very poor surgical candidate with a high likelihood of perioperative mortality -D/c aline.  Keep femoral access for  access.  -Transferred to palliative floor  -No further labs -Continue prn morphine for pain, may need to move to gtt at some point. -C/w Prn ativan, robinul, tylenol  -Will avoid ice chips and move to mouth swabs -Family inquired about transfer to hospice but given her SBP in the 70's, felt too high risk and could worsen her discomfort.  -Anticipate hospital passing.  -Ongoing family support and allow visitation   DVT prophylaxis:   None as patient is comfort measures    Code Status: DNR Family Communication: Discussed with family at bedside  Disposition Plan:  Level of care: Med-Surg Status is: Inpatient Remains inpatient appropriate because: Has a high risk for inpatient decompensation and mortality given her current condition and expected in-hospital death   Consultants:  PCCM transfer Cardiothoracic surgery  Procedures:  CTA Chest w/ Contrast  Antimicrobials:  Anti-infectives (From admission, onward)    None       Subjective: Seen and examined at bedside she is resting comfortably with family at bedside.  She is essentially unresponsive to physical and verbal stimuli.  No other concerns or complaints at this time.  Objective: Vitals:   07/12/22 0900 07/12/22 1000 07/12/22 1100 07/13/22 0631  BP:    127/71  Pulse: (!) 129 (!) 137 (!) 122 73  Resp: '18 19 15 16  '$ Temp: 99.7 F (37.6 C) 99.6 F (37.6 C) 99.7 F (37.6 C) 97.6 F (36.4 C)  TempSrc:    Oral  SpO2: (!) 87% (!) 87% (!) 87% 93%    Intake/Output Summary (Last 24 hours) at 07/13/2022 1349 Last data filed at 07/13/2022 1236 Gross per 24 hour  Intake 0 ml  Output 350 ml  Net -350 ml  There were no vitals filed for this visit.  Examination: Physical Exam:  Constitutional: WN/WD obese Caucasian female who is resting and appears comfortable and not responsive to verbal or physical stimuli Respiratory: Diminished to auscultation bilaterally with some crackles, no wheezing, rales, rhonchi. Normal  respiratory effort and patient is not tachypenic. No accessory muscle use.  Not wearing any supplemental oxygen via nasal cannula Cardiovascular: RRR, no murmurs / rubs / gallops. S1 and S2 auscultated. Abdomen: Soft, non-tender, distended secondary body habitus.  Bowel sounds positive.  GU: Deferred. Musculoskeletal: No clubbing / cyanosis of digits/nails. No joint deformity upper and lower extremities.  Skin: No rashes, lesions, ulcers on a limited skin evaluation. No induration; Warm and dry.  Neurologic: Not responsive to verbal or physical stimuli Psychiatric: Impaired judgment and insight due to her current condition  Data Reviewed: I have personally reviewed following labs and imaging studies  CBC: Recent Labs  Lab 07/17/2022 0655 07/27/2022 0701 07/12/22 0605  WBC 19.4*  --  19.3*  NEUTROABS 14.7*  --   --   HGB 14.2 15.6* 14.6  HCT 44.1 46.0 41.5  MCV 103.8*  --  97.2  PLT 201  --  510   Basic Metabolic Panel: Recent Labs  Lab 08/06/2022 0701 07/18/2022 0815 07/12/22 0605  NA 136 130* 132*  K 3.8 3.1* 5.0  CL 104 100 103  CO2  --  20* 19*  GLUCOSE 310* 288* 153*  BUN 9 9 29*  CREATININE 0.80 1.25* 2.07*  CALCIUM  --  8.6* 9.6   GFR: Estimated Creatinine Clearance: 25 mL/min (A) (by C-G formula based on SCr of 2.07 mg/dL (H)). Liver Function Tests: No results for input(s): "AST", "ALT", "ALKPHOS", "BILITOT", "PROT", "ALBUMIN" in the last 168 hours. No results for input(s): "LIPASE", "AMYLASE" in the last 168 hours. No results for input(s): "AMMONIA" in the last 168 hours. Coagulation Profile: No results for input(s): "INR", "PROTIME" in the last 168 hours. Cardiac Enzymes: No results for input(s): "CKTOTAL", "CKMB", "CKMBINDEX", "TROPONINI" in the last 168 hours. BNP (last 3 results) No results for input(s): "PROBNP" in the last 8760 hours. HbA1C: No results for input(s): "HGBA1C" in the last 72 hours. CBG: No results for input(s): "GLUCAP" in the last 168  hours. Lipid Profile: No results for input(s): "CHOL", "HDL", "LDLCALC", "TRIG", "CHOLHDL", "LDLDIRECT" in the last 72 hours. Thyroid Function Tests: No results for input(s): "TSH", "T4TOTAL", "FREET4", "T3FREE", "THYROIDAB" in the last 72 hours. Anemia Panel: No results for input(s): "VITAMINB12", "FOLATE", "FERRITIN", "TIBC", "IRON", "RETICCTPCT" in the last 72 hours. Sepsis Labs: Recent Labs  Lab 07/26/2022 0655 07/10/2022 0830  LATICACIDVEN 8.6* 8.1*    Recent Results (from the past 240 hour(s))  Blood culture (routine x 2)     Status: None (Preliminary result)   Collection Time: 07/17/2022  8:15 AM   Specimen: BLOOD  Result Value Ref Range Status   Specimen Description BLOOD  Final   Special Requests   Final    RIGHT FEMORAL Blood Culture adequate volume BOTTLES DRAWN AEROBIC AND ANAEROBIC   Culture   Final    NO GROWTH 2 DAYS Performed at [facility], [place] [address], [place], [place] [address]    Report Status PENDING  Incomplete  SARS Coronavirus 2 by RT PCR (hospital order, performed in [facility] hospital lab) *cepheid single result test* Anterior Nasal Swab     Status: None   Collection Time: 07/12/2022  8:32 AM   Specimen: Anterior Nasal Swab  Result  Value Ref Range Status   SARS Coronavirus 2 by RT PCR NEGATIVE NEGATIVE Final    Comment: (NOTE) SARS-CoV-2 target nucleic acids are NOT DETECTED.  The SARS-CoV-2 RNA is generally detectable in upper and lower respiratory specimens during the acute phase of infection. The lowest concentration of SARS-CoV-2 viral copies this assay can detect is 250 copies / mL. A negative result does not preclude SARS-CoV-2 infection and should not be used as the sole basis for treatment or other patient management decisions.  A negative result may occur with improper specimen collection / handling, submission of specimen other than nasopharyngeal swab, presence of viral mutation(s) within the areas targeted by this assay, and  inadequate number of viral copies (<250 copies / mL). A negative result must be combined with clinical observations, patient history, and epidemiological information.  Fact Sheet for Patients:   https://www.patel.info/  Fact Sheet for Healthcare Providers: https://hall.com/  This test is not yet approved or  cleared by the [place] FDA and has been authorized for detection and/or diagnosis of SARS-CoV-2 by FDA under an Emergency Use Authorization (EUA).  This EUA will remain in effect (meaning this test can be used) for the duration of the COVID-19 declaration under Section 564(b)(1) of the Act, 21 U.S.C. section 360bbb-3(b)(1), unless the authorization is terminated or revoked sooner.  Performed at [facility], [place] [address] [place], [place] [address]   MRSA Next Gen by PCR, Nasal     Status: None   Collection Time: 07/20/2022 12:29 PM   Specimen: Nasal Mucosa; Nasal Swab  Result Value Ref Range Status   MRSA by PCR Next Gen NOT DETECTED NOT DETECTED Final    Comment: (NOTE) The GeneXpert MRSA Assay (FDA approved for NASAL specimens only), is one component of a comprehensive MRSA colonization surveillance program. It is not intended to diagnose MRSA infection nor to guide or monitor treatment for MRSA infections. Test performance is not FDA approved in patients less than 55 years old. Performed at [facility], [place] [address], [place], [place] [address]      Radiology Studies: No results found.   Scheduled Meds: Continuous Infusions:  sodium chloride 20 mL/hr at 07/12/22 0800    LOS: 2 days   Raiford Noble, DO [facility] Available via Epic secure chat 7am-7pm After these hours, please refer to coverage provider listed on [email or web address] 07/13/2022, 1:49 PM

## 2022-07-13 NOTE — Progress Notes (Signed)
    OVERNIGHT PROGRESS REPORT   76 year old patient post robotic hiatal hernia repair on 06/30/2022. Originally code sepsis. Patient was deemed poor surgical candidate with a high mortality risk, CCM and attending both note patient is DNR with decision to proceed to comfort care after discussing with family.  Notified by RN at this time with patient's family request to proceed to comfort care with previously noted morphine drip.  As noted previously :  "She is a poor surgical candidate with high mortality risk and after discussion the patient was made DNR and decision was made to proceed with comfort care." Dr. Claybon Jabs attending note.  Also noted in CCM rounding note. "....  patient was made a DNR and decision made to proceed with comfort care."       Gershon Cull MSNA MSN ACNPC-AG Acute Care Nurse Practitioner [place]

## 2022-07-13 NOTE — Progress Notes (Signed)
  Transition of Care [facility]) Screening Note   Patient Details  Name: Mixtli Reno Date of Birth: [birth year]   Transition of Care [facility]) CM/SW Contact:    Pollie Friar, RN Phone Number: 07/13/2022, 1:18 PM   Comfort measures Transition of Care Department [facility]) has reviewed patient and no TOC needs have been identified at this time. We will continue to monitor patient advancement through interdisciplinary progression rounds. If new patient transition needs arise, please place a TOC consult.

## 2022-07-14 ENCOUNTER — Other Ambulatory Visit: Payer: Self-pay

## 2022-07-14 DIAGNOSIS — I7100 Dissection of unspecified site of aorta: Secondary | ICD-10-CM | POA: Diagnosis not present

## 2022-07-14 DIAGNOSIS — Z515 Encounter for palliative care: Secondary | ICD-10-CM | POA: Diagnosis not present

## 2022-07-14 DIAGNOSIS — D500 Iron deficiency anemia secondary to blood loss (chronic): Secondary | ICD-10-CM

## 2022-07-14 MED ORDER — SODIUM CHLORIDE 0.9 % IV SOLN
0.5000 mg/h | INTRAVENOUS | Status: DC
  Administered 2022-07-14 – 2022-07-16 (×2): 0.5 mg/h via INTRAVENOUS
  Filled 2022-07-14: qty 2.5
  Filled 2022-07-14: qty 5

## 2022-07-14 MED ORDER — HYDROMORPHONE BOLUS VIA INFUSION
0.5000 mg | INTRAVENOUS | Status: DC | PRN
  Administered 2022-07-14: 0.5 mg via INTRAVENOUS

## 2022-07-14 NOTE — Consult Note (Cosign Needed Addendum)
Palliative Medicine Inpatient Consult Note  Consulting Provider: Kerney Elbe, DO  Reason for consult:   [place] Palliative Medicine Consult  Reason for Consult? End of Life Care; ? Hospice Placement   07/14/2022  HPI:  Per intake H&P --> Patient is a 76 year old obese Caucasian female with a past medical history significant for but not limited to depression and anxiety, anemia, constipation, history of uterine fibroids, GERD, history of seasonal allergies, hypertension, hyperlipidemia, osteoarthritis of the left hip, sleep apnea as well as other comorbidities who presented to the hospital with shortness of breath from her STR facility where she was convalescing following robotic hiatal hernia repair by Dr. Rosendo Gros on 06/30/2022. Had suffered an acute type A aortic dissection with cardiogenic shock from pericardial tamponade Palliative care has been asked to get involved to    Clinical Assessment/Goals of Care:  *Please note that this is a verbal dictation therefore any spelling or grammatical errors are due to the "[place] [employer]" system interpretation.  I have reviewed medical records including EPIC notes, labs and imaging, received report from bedside RN, assessed the patient.    I met with patients son, Shanon Brow to further discuss diagnosis prognosis, GOC, EOL wishes, disposition and options.   I introduced Palliative Medicine as specialized medical care for people living with serious illness. It focuses on providing relief from the symptoms and stress of a serious illness. The goal is to improve quality of life for both the patient and the family.  Medical History Review and Understanding:  We reviewed that on 9/2 Delmi was admitted with aortic dissection. Discussed that family was told she would not survive greater than a few hours and was not a surgical candidate.   Social History:  Lives at [facility] in [place]. Is divorced and has three  children. 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 Practices within Judaism.   Functional and Nutritional State:  Was previously at [facility] for rehabilitation.   Advance Directives: A detailed discussion was had today regarding advanced directives.  Yes - these can be found in [place]. Surrogate decision maker is son, Quillian Quince.   Code Status:  DNAR/DNI.  Discussion:  Patients children, Quillian Quince and [place] spoken to on speaker-phone.   Reviewed patients CT images and CTS notes. Discussed with patients family patients present clinical situation. Discussed that patient had been deemed to not be a surgical candidate. Discussed the plan to keep the patient comfortable. We talked about comfort measures inclusive of medications to control pain, dyspnea, agitation, nausea, itching, and hiccups. Reviewed that patient was presently on a morphine gtt and to modify this to a dilaudid gtt in the setting of worsening kidney function. Family is all in agreement with this. Patients DIL asks if we can re-image patients chest to give "peace of mind". I shared that this is not something which is normally done given the risk and already known reality of no surgical intervention. Patient DIL shares she is wondering if Tracina can be a "miracle." I shared that I would bring it up to the primary team as she and Shanon Brow strongly desired at least consideration of this. Utilized reflective listening throughout our time together.   Discussed plan to observe Christyann for another 24 hours and if stable we could consider transfer to an inpatient hospice home.   Family is presently struggling with patient not having passed within the hours they were told and that she has lived multiple days.  Reviewed that each patients timeline is different.   Discussed the importance of continued conversation with family and their  medical providers regarding overall plan of care and treatment options,  ensuring decisions are within the context of the patients values and GOCs.  Provided "Gone From My Site" booklet.  Decision Maker: Dymond, Gutt Son    [phone]     SUMMARY OF RECOMMENDATIONS   DNAR/DNI  Patients family would appreciate hearing from CTS to further understand why additional imaging would not be of utility at this time, they are struggling as they assumed patient would pass within a few hours and has now lived for days  [place]  Have spoken to patients family about switching morphine gtt to dilaudid gtt which they are in agreement with   Plan to monitor for another days, if stable to further discuss in patient hospice  Ongoing Palliative support  Code Status/Advance Care Planning: DNAR/DNI  Palliative Prophylaxis:  Aspiration, Bowel Regimen, Delirium Protocol, Frequent Pain Assessment, Oral Care, Palliative Wound Care, and Turn Reposition  Additional Recommendations (Limitations, Scope, Preferences): Comfort focused care  Psycho-social/Spiritual:  Desire for further Chaplaincy support:Not presently Additional Recommendations: Education on    Prognosis: Limited to days  Discharge Planning: [facility] versus inpatient hospice.  Vitals:   07/13/22 0631 07/13/22 2028  BP: 127/71 (!) 117/56  Pulse: 73 80  Resp: 16 16  Temp: 97.6 F (36.4 C) 98.6 F (37 C)  SpO2: 93% 90%    Intake/Output Summary (Last 24 hours) at 07/14/2022 1552 Last data filed at 07/14/2022 0600 Gross per 24 hour  Intake 0 ml  Output 850 ml  Net -850 ml   Gen:  Elderly F in NAD HEENT: moist mucous membranes CV: Regular rate and rhythm PULM: Breathing is even and nonlabored on RA ABD: soft/nontender  EXT: RUE edema  Neuro: Somnolent  PPS: 10%   This conversation/these recommendations were discussed with patient primary care team, Dr. Alfredia Ferguson  Billing based on MDM: High  Problems Addressed: One acute or chronic illness or injury that poses a threat to life or bodily  function  Amount and/or Complexity of Data: Category 3:Discussion of management or test interpretation with external physician/other qualified health care professional/appropriate source (not separately reported)  Risks: Decision regarding hospitalization or escalation of hospital care and Decision not to resuscitate or to de-escalate care because of poor prognosis ______________________________________________________ [place] Team Team Cell Phone: [phone] Please utilize secure chat with additional questions, if there is no response within 30 minutes please call the above phone number  Palliative Medicine Team providers are available by phone from 7am to 7pm daily and can be reached through the team cell phone.  Should this patient require assistance outside of these hours, please call the patient's attending physician.

## 2022-07-14 NOTE — Progress Notes (Signed)
The family will like to have morphine drip,On call provider informed and order given,patient breathing,  respiration 22,and pulse palpable,no signs of distress,morphine drip started,family at bedside ,will continue to monitor.

## 2022-07-14 NOTE — Progress Notes (Signed)
Appointments for tomorrow at [facility] canceled d/t inpt admission.

## 2022-07-14 NOTE — Care Management Important Message (Signed)
Important Message  Patient Details  Name: Molly Lucas MRN: [id number] Date of Birth: 08/17/1946   Medicare Important Message Given:  Other (see comment)     Hannah Beat 07/14/2022, 3:12 PM

## 2022-07-14 NOTE — Progress Notes (Signed)
PROGRESS NOTE    Molly Lucas [facility]  GQB:[id number] DOB: 1946/07/31 DOA: 08/08/2022 PCP: Donnajean Lopes, MD   Brief Narrative:  Patient is a 76 year old obese Caucasian female with a past medical history significant for but not limited to depression and anxiety, anemia, constipation, history of uterine fibroids, GERD, history of seasonal allergies, hypertension, hyperlipidemia, osteoarthritis of the left hip, sleep apnea as well as other comorbidities who presented to the hospital with shortness of breath from her STR facility where she was convalescing following robotic hiatal hernia repair by Dr. Rosendo Gros on 06/30/2022.  She presented in shock and code sepsis was initiated she was given a liter of IV fluids and started on epinephrine which corrected initial shock.  She was initially thought to have suffered a PE but the chest PE protocol showed a pericardial effusion and suspicion of dissection.  She underwent a dedicated dissection study at which showed a type a dissection to the level of the iliacs and cardiothoracic surgery was consulted.  She developed right hemiparesis concerning for embolic stroke from the dissection.  She is a poor surgical candidate with high mortality risk and after discussion the patient was made DNR and decision was made to proceed with comfort care.  Overnight she became a little bit more uncomfortable so was started on a morphine drip.  She appears a little bit comfortable today and is more awake and alert compared to yesterday given that she received Ativan.  Will likely change from the morphine drip to Dilaudid drip given that her renal function had worsened in the last check. Palliative care has been consulted for further symptom management.   Assessment and Plan:  Acute type A aortic dissection with cardiogenic shock from pericardial tamponade  AKI from shock and possible renal artery involvement Right hemiparesis likely due to left carotid involvement.   Leukocytosis Metabolic Acidosis Obesity   -She required esmolol and pressors previously -Continue full comfort care -Cardiothoracic surgery was consulted and they discussed patient's clinical course and she is a very poor surgical candidate with a high likelihood of perioperative mortality -D/c aline.  Keep femoral access for access.  -Transferred to palliative floor  -No further labs -Continue prn morphine for pain and was changed to morphine drip overnight but will likely need to change to Dilaudid given that her worsening renal function -C/w Prn ativan, robinul, tylenol  -Will avoid ice chips and move to mouth swabs -Family inquired about transfer to hospice but given her SBP in the 70's, felt too high risk and could worsen her discomfort.  -Anticipate hospital passing and further decompensation but may be able to go to residential hospice.  -Ongoing family support and allow visitation  DVT prophylaxis:   None as patient is comfort measures    Code Status: DNR Family Communication: Discussed with the to family numbers at bedside and over the phone with her youngest son Shanon Brow  Disposition Plan:  Level of care: Med-Surg Status is: Inpatient Remains inpatient appropriate because: Has a high risk for inpatient decompensation and mortality given her current condition and expected in-hospital death   Consultants:  PCCM transfer Cardiothoracic surgery  Procedures:  CTA Chest w/ Contrast  Antimicrobials:  Anti-infectives (From admission, onward)    None       Subjective: Examined at bedside and she is little bit more awake and interactive today but does appear little uncomfortable.  She is more responsive to physical and verbal stimuli.  Daughter states that she is grimacing little bit more today.  She states that the Ativan knocked her out and S yesterday.  No other concerns or complaints this time.  Objective: Vitals:   07/12/22 1000 07/12/22 1100 07/13/22 0631 07/13/22  2028  BP:   127/71 (!) 117/56  Pulse: (!) 137 (!) 122 73 80  Resp: '19 15 16 16  '$ Temp: 99.6 F (37.6 C) 99.7 F (37.6 C) 97.6 F (36.4 C) 98.6 F (37 C)  TempSrc:   Oral Axillary  SpO2: (!) 87% (!) 87% 93% 90%    Intake/Output Summary (Last 24 hours) at 07/14/2022 1511 Last data filed at 07/14/2022 0600 Gross per 24 hour  Intake 0 ml  Output 850 ml  Net -850 ml   There were no vitals filed for this visit.  Examination: Physical Exam:  Constitutional: WN/WD obese Caucasian female who is in slight distress appears a little uncomfortable but is more interactive today Respiratory: Diminished to auscultation bilaterally with coarse breath sounds, no wheezing, rales, rhonchi or crackles. Normal respiratory effort and patient is not tachypenic. No accessory muscle use.  Not wearing supplemental oxygen nasal cannula Cardiovascular: Tachycardic and irregular, no murmurs / rubs / gallops. S1 and S2 auscultated.  Abdomen: Soft, non-tender, distended secondary body habitus. Bowel sounds positive.  GU: Deferred. Musculoskeletal: No clubbing / cyanosis of digits/nails. No joint deformity upper and lower extremities.  Skin: No rashes, lesions, ulcers. No induration; Warm and dry.  Neurologic: CN 2-12 grossly intact with no focal deficits. Psychiatric: Slightly impaired judgment and insight  Data Reviewed: I have personally reviewed following labs and imaging studies  CBC: Recent Labs  Lab 07/31/2022 0655 08/05/2022 0701 07/12/22 0605  WBC 19.4*  --  19.3*  NEUTROABS 14.7*  --   --   HGB 14.2 15.6* 14.6  HCT 44.1 46.0 41.5  MCV 103.8*  --  97.2  PLT 201  --  212   Basic Metabolic Panel: Recent Labs  Lab 07/17/2022 0701 08/08/2022 0815 07/12/22 0605  NA 136 130* 132*  K 3.8 3.1* 5.0  CL 104 100 103  CO2  --  20* 19*  GLUCOSE 310* 288* 153*  BUN 9 9 29*  CREATININE 0.80 1.25* 2.07*  CALCIUM  --  8.6* 9.6   GFR: Estimated Creatinine Clearance: 25 mL/min (A) (by C-G formula based on  SCr of 2.07 mg/dL (H)). Liver Function Tests: No results for input(s): "AST", "ALT", "ALKPHOS", "BILITOT", "PROT", "ALBUMIN" in the last 168 hours. No results for input(s): "LIPASE", "AMYLASE" in the last 168 hours. No results for input(s): "AMMONIA" in the last 168 hours. Coagulation Profile: No results for input(s): "INR", "PROTIME" in the last 168 hours. Cardiac Enzymes: No results for input(s): "CKTOTAL", "CKMB", "CKMBINDEX", "TROPONINI" in the last 168 hours. BNP (last 3 results) No results for input(s): "PROBNP" in the last 8760 hours. HbA1C: No results for input(s): "HGBA1C" in the last 72 hours. CBG: No results for input(s): "GLUCAP" in the last 168 hours. Lipid Profile: No results for input(s): "CHOL", "HDL", "LDLCALC", "TRIG", "CHOLHDL", "LDLDIRECT" in the last 72 hours. Thyroid Function Tests: No results for input(s): "TSH", "T4TOTAL", "FREET4", "T3FREE", "THYROIDAB" in the last 72 hours. Anemia Panel: No results for input(s): "VITAMINB12", "FOLATE", "FERRITIN", "TIBC", "IRON", "RETICCTPCT" in the last 72 hours. Sepsis Labs: Recent Labs  Lab 07/20/2022 0655 08/08/2022 0830  LATICACIDVEN 8.6* 8.1*    Recent Results (from the past 240 hour(s))  Blood culture (routine x 2)     Status: None (Preliminary result)   Collection Time: 08/06/2022  8:15 AM  Specimen: BLOOD  Result Value Ref Range Status   Specimen Description BLOOD  Final   Special Requests   Final    RIGHT FEMORAL Blood Culture adequate volume BOTTLES DRAWN AEROBIC AND ANAEROBIC   Culture   Final    NO GROWTH 3 DAYS Performed at [facility], [place] [address], [place], [place] [address]    Report Status PENDING  Incomplete  SARS Coronavirus 2 by RT PCR (hospital order, performed in [facility] hospital lab) *cepheid single result test* Anterior Nasal Swab     Status: None   Collection Time: 07/24/2022  8:32 AM   Specimen: Anterior Nasal Swab  Result Value Ref Range Status   SARS Coronavirus 2 by RT PCR  NEGATIVE NEGATIVE Final    Comment: (NOTE) SARS-CoV-2 target nucleic acids are NOT DETECTED.  The SARS-CoV-2 RNA is generally detectable in upper and lower respiratory specimens during the acute phase of infection. The lowest concentration of SARS-CoV-2 viral copies this assay can detect is 250 copies / mL. A negative result does not preclude SARS-CoV-2 infection and should not be used as the sole basis for treatment or other patient management decisions.  A negative result may occur with improper specimen collection / handling, submission of specimen other than nasopharyngeal swab, presence of viral mutation(s) within the areas targeted by this assay, and inadequate number of viral copies (<250 copies / mL). A negative result must be combined with clinical observations, patient history, and epidemiological information.  Fact Sheet for Patients:   https://www.patel.info/  Fact Sheet for Healthcare Providers: https://hall.com/  This test is not yet approved or  cleared by the [place] FDA and has been authorized for detection and/or diagnosis of SARS-CoV-2 by FDA under an Emergency Use Authorization (EUA).  This EUA will remain in effect (meaning this test can be used) for the duration of the COVID-19 declaration under Section 564(b)(1) of the Act, 21 U.S.C. section 360bbb-3(b)(1), unless the authorization is terminated or revoked sooner.  Performed at [facility], [place] [address], [place], [place] [address]   MRSA Next Gen by PCR, Nasal     Status: None   Collection Time: 07/28/2022 12:29 PM   Specimen: Nasal Mucosa; Nasal Swab  Result Value Ref Range Status   MRSA by PCR Next Gen NOT DETECTED NOT DETECTED Final    Comment: (NOTE) The GeneXpert MRSA Assay (FDA approved for NASAL specimens only), is one component of a comprehensive MRSA colonization surveillance program. It is not intended to diagnose MRSA infection nor to  guide or monitor treatment for MRSA infections. Test performance is not FDA approved in patients less than 70 years old. Performed at [facility], [place] [address] [place], [place] [address]     Radiology Studies: No results found.  Scheduled Meds: Continuous Infusions:  sodium chloride 20 mL/hr at 07/12/22 0800   morphine 1 mg/hr (07/14/22 0016)    LOS: 3 days   Raiford Noble, DO [facility] Available via Epic secure chat 7am-7pm After these hours, please refer to coverage provider listed on [email or web address] 07/14/2022, 3:11 PM

## 2022-07-14 NOTE — Progress Notes (Signed)
9/5 IM Letter respectfully not given because patient is under Comfort Measures.

## 2022-07-14 NOTE — Progress Notes (Cosign Needed)
Removed pts Ring via the request of the family.   Erma Heritage, NP-S

## 2022-07-15 ENCOUNTER — Ambulatory Visit: Payer: Medicare Other

## 2022-07-15 ENCOUNTER — Other Ambulatory Visit: Payer: Medicare Other

## 2022-07-15 ENCOUNTER — Ambulatory Visit: Payer: Medicare Other | Admitting: Hematology

## 2022-07-15 DIAGNOSIS — I7100 Dissection of unspecified site of aorta: Secondary | ICD-10-CM | POA: Diagnosis not present

## 2022-07-15 DIAGNOSIS — Z515 Encounter for palliative care: Secondary | ICD-10-CM | POA: Diagnosis not present

## 2022-07-15 DIAGNOSIS — Z7189 Other specified counseling: Secondary | ICD-10-CM

## 2022-07-15 LAB — TYPE AND SCREEN
ABO/RH(D): O POS
Antibody Screen: NEGATIVE
Donor AG Type: NEGATIVE
Donor AG Type: NEGATIVE
Unit division: 0
Unit division: 0

## 2022-07-15 LAB — BPAM RBC
Blood Product Expiration Date: 202310022359
Blood Product Expiration Date: 202310032359
Unit Type and Rh: 5100
Unit Type and Rh: 5100

## 2022-07-15 MED ORDER — SCOPOLAMINE 1 MG/3DAYS TD PT72
1.0000 | MEDICATED_PATCH | TRANSDERMAL | Status: DC
  Administered 2022-07-15: 1.5 mg via TRANSDERMAL
  Filled 2022-07-15: qty 1

## 2022-07-15 MED ORDER — GLYCOPYRROLATE 0.2 MG/ML IJ SOLN
0.4000 mg | INTRAMUSCULAR | Status: DC
  Administered 2022-07-15 – 2022-07-16 (×8): 0.4 mg via INTRAVENOUS
  Filled 2022-07-15 (×8): qty 2

## 2022-07-15 MED ORDER — ATROPINE SULFATE 1 % OP SOLN
1.0000 [drp] | Freq: Four times a day (QID) | OPHTHALMIC | Status: DC
  Administered 2022-07-15 – 2022-07-16 (×4): 1 [drp] via SUBLINGUAL
  Filled 2022-07-15: qty 2

## 2022-07-15 NOTE — Progress Notes (Addendum)
   Palliative Medicine Inpatient Follow Up Note HPI: Patient is a 76 year old obese Caucasian female with a past medical history significant for but not limited to depression and anxiety, anemia, constipation, history of uterine fibroids, GERD, history of seasonal allergies, hypertension, hyperlipidemia, osteoarthritis of the left hip, sleep apnea as well as other comorbidities who presented to the hospital with shortness of breath from her STR facility where she was convalescing following robotic hiatal hernia repair by Dr. Rosendo Gros on 06/30/2022. Had suffered an acute type A aortic dissection with cardiogenic shock from pericardial tamponade Palliative care has been asked to get involved to   Today's Discussion 07/15/2022  *Please note that this is a verbal dictation therefore any spelling or grammatical errors are due to the "[place] [employer]" system interpretation.  Chart reviewed inclusive of vital signs, progress notes, laboratory results, and diagnostic images. Continue dilaudid 0.5mg /hr  I met with patients son, Quillian Quince and daughter, Theodoro Clock at bedside. Created space and opportunity for patient to explore thoughts feelings and fears regarding current medical situation. Offered support in the setting of patients end of life care and journey. Reviewed the physiological changes we are seeing inclusive of patients rattle which we will start glycopyrrolate for, cheyne stokes respirations, and decreased circulation.   Dr. Cyndia Bent stopped by last night per family and had a long discussion with patients son, Shanon Brow. Per family they were thankful this conversation was had as they verbalized Shanon Brow is struggling the most with patients declined health state.   Presently, it appears patient is nearing true end of life. Have spoken to family for are in agreement with her remaining in the hospital at this time.   Questions and concerns addressed/Palliative Support Provided.   Objective Assessment: Vital  Signs Vitals:   07/13/22 2028 07/14/22 1957  BP: (!) 117/56 104/69  Pulse: 80 (!) 120  Resp: 16   Temp: 98.6 F (37 C) 97.8 F (36.6 C)  SpO2: 90% (!) 89%    Intake/Output Summary (Last 24 hours) at 07/15/2022 9191 Last data filed at 07/14/2022 1821 Gross per 24 hour  Intake 82.89 ml  Output --  Net 82.89 ml   Gen:  Elderly F in NAD HEENT: moist mucous membranes CV: Regular rate and rhythm PULM: Breathing is even and nonlabored on 4LPM [place] ABD: soft/nontender  EXT: RUE edema  Neuro: Somnolent  SUMMARY OF RECOMMENDATIONS   DNAR/DNI   Comfort Care   Dilaudid gtt continued  Add glycopyrrolate ATC   Remain in house, anticipate prognosis limited hours to days  Ongoing Palliative support  Billing based on MDM: High  Problems Addressed: One acute or chronic illness or injury that poses a threat to life or bodily function  Amount and/or Complexity of Data: Category 3:Discussion of management or test interpretation with external physician/other qualified health care professional/appropriate source (not separately reported)  Risks: Parenteral controlled substances and Decision not to resuscitate or to de-escalate care because of poor prognosis ______________________________________________________________________________________ [place] Team Team Cell Phone: [phone] Please utilize secure chat with additional questions, if there is no response within 30 minutes please call the above phone number  Palliative Medicine Team providers are available by phone from 7am to 7pm daily and can be reached through the team cell phone.  Should this patient require assistance outside of these hours, please call the patient's attending physician.

## 2022-07-15 NOTE — Progress Notes (Signed)
PROGRESS NOTE    [facility]  INO:[id number] DOB: Oct 11, 1946 DOA: 08/05/2022 PCP: Donnajean Lopes, MD   Brief Narrative:  Patient is a 76 year old obese Caucasian female with a past medical history significant for but not limited to depression and anxiety, anemia, constipation, history of uterine fibroids, GERD, history of seasonal allergies, hypertension, hyperlipidemia, osteoarthritis of the left hip, sleep apnea as well as other comorbidities who presented to the hospital with shortness of breath from her STR facility where she was convalescing following robotic hiatal hernia repair by Dr. Rosendo Gros on 06/30/2022.  She presented in shock and code sepsis was initiated she was given a liter of IV fluids and started on epinephrine which corrected initial shock.  She was initially thought to have suffered a PE but the chest PE protocol showed a pericardial effusion and suspicion of dissection.  She underwent a dedicated dissection study at which showed a type a dissection to the level of the iliacs and cardiothoracic surgery was consulted.  She developed right hemiparesis concerning for embolic stroke from the dissection.  She is a poor surgical candidate with high mortality risk and after discussion the patient was made DNR and decision was made to proceed with comfort care.   Overnight she became a little bit more uncomfortable so was started on a morphine drip.  She appears a little bit comfortable today and is more awake and alert compared to yesterday given that she received Ativan.  Will likely change from the morphine drip to Dilaudid drip given that her renal function had worsened in the last check. Palliative care has been consulted for further symptom management and she has been transitioned to a Dilaudid drip   Assessment and Plan:  Acute type A aortic dissection with cardiogenic shock from pericardial tamponade  AKI from shock and possible renal artery involvement Right hemiparesis  likely due to left carotid involvement.  Leukocytosis Metabolic Acidosis Obesity   -She required esmolol and pressors previously -Continue full comfort care now -Cardiothoracic surgery was consulted and they discussed patient's clinical course and she is a very poor surgical candidate with a high likelihood of perioperative mortality; appreciate Dr. Cyndia Bent speaking with the family again last night -D/c aline.  Keep femoral access for access.  -Transferred to palliative floor and anticipate in-hospital death so she will remain here -No further labs -Patient was switched to Dilaudid gtt and is getting 0.5-4 mg/h along with breakthrough Dilaudid 0.5 mg IV every 15 minutes as needed for dyspnea and pain -C/w acetaminophen 650 mg p.o. every 6/RC as needed for fever greater than 101 -Glycopyrrolate has been scheduled at 0.4 mg IV every 4 hours -Continue Foley catheter -Continue with lorazepam 1 to 2 mg IV every 2 as needed for anxiety as well as MiraLAX 17 g p.o. daily as needed for constipation and Liquifilm ophthalmic 1.47 drops in both eyes 4 times daily as needed for dry eyes -Will avoid ice chips and move to mouth swabs -Family inquired about transfer to hospice but given her SBP in the 70's, felt too high risk and could worsen her discomfort and now she appears to be getting closer to passing away -Anticipate hospital passing and further decompensation and is not safe enough to be transition to residential hospice -Continue ongoing family support and allow visitation as her prognosis is extremely poor and anticipate passing away in within Limited hours to days    DVT prophylaxis:   None as patient is comfort care    Code Status:  DNR Family Communication: Discussed with the whole family at bedside  Disposition Plan:  Level of care: Med-Surg Status is: Inpatient Remains inpatient appropriate because: Anticipate in-hospital death given that she is not safe enough to transfer given her  extreme somnolence and short shallow breathing now   Consultants:  PCCM transfer Cardiothoracic surgery  Procedures:  CTA Chest w/ Contrast  Antimicrobials:  Anti-infectives (From admission, onward)    None       Subjective: Seen and examined at bedside and she is more unresponsive compared to yesterday and she is having shorter and shallower breathing.  She did not respond to physical or verbal stimuli.  Whole family is at bedside currently.  She is resting comfortably but daughter was concerned about her secretions.  No other concerns or complaints at this time.  Objective: Vitals:   07/12/22 1100 07/13/22 0631 07/13/22 2028 07/14/22 1957  BP:  127/71 (!) 117/56 104/69  Pulse: (!) 122 73 80 (!) 120  Resp: '15 16 16   '$ Temp: 99.7 F (37.6 C) 97.6 F (36.4 C) 98.6 F (37 C) 97.8 F (36.6 C)  TempSrc:  Oral Axillary Oral  SpO2: (!) 87% 93% 90% (!) 89%    Intake/Output Summary (Last 24 hours) at 07/15/2022 1226 Last data filed at 07/14/2022 1821 Gross per 24 hour  Intake 82.89 ml  Output --  Net 82.89 ml   There were no vitals filed for this visit.  Examination: Physical Exam:  Constitutional: WN/WD obese Caucasian female who appears comfortable on a Dilaudid drip Respiratory: Diminished to auscultation bilaterally with coarse breath sounds and some rhonchi and upper airway breathing noted.  She has short shallow breathing now Cardiovascular: Tachycardic rate and this is a regular, no murmurs / rubs / gallops. S1 and S2 auscultated.  Has 1+ extremity edema Abdomen: Soft, non-tender, distended secondary body habitus.. Bowel sounds positive.  GU: Deferred.  Foley catheter is in place Musculoskeletal: No clubbing / cyanosis of digits/nails. Normal strength and muscle tone.  Skin: No rashes, lesions, ulcers on limited skin evaluation. Neurologic: Not awake enough to participate in normal examination and is unresponsive to physical movement verbal stimuli given her  somnolence  Data Reviewed: I have personally reviewed following labs and imaging studies  CBC: Recent Labs  Lab 07/24/2022 0655 07/15/2022 0701 07/12/22 0605  WBC 19.4*  --  19.3*  NEUTROABS 14.7*  --   --   HGB 14.2 15.6* 14.6  HCT 44.1 46.0 41.5  MCV 103.8*  --  97.2  PLT 201  --  494   Basic Metabolic Panel: Recent Labs  Lab 07/14/2022 0701 07/17/2022 0815 07/12/22 0605  NA 136 130* 132*  K 3.8 3.1* 5.0  CL 104 100 103  CO2  --  20* 19*  GLUCOSE 310* 288* 153*  BUN 9 9 29*  CREATININE 0.80 1.25* 2.07*  CALCIUM  --  8.6* 9.6   GFR: Estimated Creatinine Clearance: 25 mL/min (A) (by C-G formula based on SCr of 2.07 mg/dL (H)). Liver Function Tests: No results for input(s): "AST", "ALT", "ALKPHOS", "BILITOT", "PROT", "ALBUMIN" in the last 168 hours. No results for input(s): "LIPASE", "AMYLASE" in the last 168 hours. No results for input(s): "AMMONIA" in the last 168 hours. Coagulation Profile: No results for input(s): "INR", "PROTIME" in the last 168 hours. Cardiac Enzymes: No results for input(s): "CKTOTAL", "CKMB", "CKMBINDEX", "TROPONINI" in the last 168 hours. BNP (last 3 results) No results for input(s): "PROBNP" in the last 8760 hours. HbA1C: No results for  input(s): "HGBA1C" in the last 72 hours. CBG: No results for input(s): "GLUCAP" in the last 168 hours. Lipid Profile: No results for input(s): "CHOL", "HDL", "LDLCALC", "TRIG", "CHOLHDL", "LDLDIRECT" in the last 72 hours. Thyroid Function Tests: No results for input(s): "TSH", "T4TOTAL", "FREET4", "T3FREE", "THYROIDAB" in the last 72 hours. Anemia Panel: No results for input(s): "VITAMINB12", "FOLATE", "FERRITIN", "TIBC", "IRON", "RETICCTPCT" in the last 72 hours. Sepsis Labs: Recent Labs  Lab 07/13/2022 0655 07/12/2022 0830  LATICACIDVEN 8.6* 8.1*    Recent Results (from the past 240 hour(s))  Blood culture (routine x 2)     Status: None (Preliminary result)   Collection Time: 07/31/2022  8:15 AM    Specimen: BLOOD  Result Value Ref Range Status   Specimen Description BLOOD  Final   Special Requests   Final    RIGHT FEMORAL Blood Culture adequate volume BOTTLES DRAWN AEROBIC AND ANAEROBIC   Culture   Final    NO GROWTH 4 DAYS Performed at [facility], [place] [address] [place], [place] [address]    Report Status PENDING  Incomplete  SARS Coronavirus 2 by RT PCR (hospital order, performed in [facility] hospital lab) *cepheid single result test* Anterior Nasal Swab     Status: None   Collection Time: 07/21/2022  8:32 AM   Specimen: Anterior Nasal Swab  Result Value Ref Range Status   SARS Coronavirus 2 by RT PCR NEGATIVE NEGATIVE Final    Comment: (NOTE) SARS-CoV-2 target nucleic acids are NOT DETECTED.  The SARS-CoV-2 RNA is generally detectable in upper and lower respiratory specimens during the acute phase of infection. The lowest concentration of SARS-CoV-2 viral copies this assay can detect is 250 copies / mL. A negative result does not preclude SARS-CoV-2 infection and should not be used as the sole basis for treatment or other patient management decisions.  A negative result may occur with improper specimen collection / handling, submission of specimen other than nasopharyngeal swab, presence of viral mutation(s) within the areas targeted by this assay, and inadequate number of viral copies (<250 copies / mL). A negative result must be combined with clinical observations, patient history, and epidemiological information.  Fact Sheet for Patients:   https://www.patel.info/  Fact Sheet for Healthcare Providers: https://hall.com/  This test is not yet approved or  cleared by the [place] FDA and has been authorized for detection and/or diagnosis of SARS-CoV-2 by FDA under an Emergency Use Authorization (EUA).  This EUA will remain in effect (meaning this test can be used) for the duration of the COVID-19 declaration  under Section 564(b)(1) of the Act, 21 U.S.C. section 360bbb-3(b)(1), unless the authorization is terminated or revoked sooner.  Performed at [facility], [place] [address] [place], [place] [address]   MRSA Next Gen by PCR, Nasal     Status: None   Collection Time: 07/31/2022 12:29 PM   Specimen: Nasal Mucosa; Nasal Swab  Result Value Ref Range Status   MRSA by PCR Next Gen NOT DETECTED NOT DETECTED Final    Comment: (NOTE) The GeneXpert MRSA Assay (FDA approved for NASAL specimens only), is one component of a comprehensive MRSA colonization surveillance program. It is not intended to diagnose MRSA infection nor to guide or monitor treatment for MRSA infections. Test performance is not FDA approved in patients less than 81 years old. Performed at [facility], [place] [address], [place], [place] [address]     Radiology Studies: No results found.  Scheduled Meds:  glycopyrrolate  0.4 mg Intravenous Q4H   Continuous Infusions:  sodium chloride Stopped (07/12/22 1114)   HYDROmorphone 0.5 mg/hr (07/14/22 1821)    LOS: 4 days   Raiford Noble, DO [facility] Available via Epic secure chat 7am-7pm After these hours, please refer to coverage provider listed on [email or web address] 07/15/2022, 12:26 PM

## 2022-07-16 DIAGNOSIS — I7100 Dissection of unspecified site of aorta: Secondary | ICD-10-CM | POA: Diagnosis not present

## 2022-07-16 DIAGNOSIS — Z515 Encounter for palliative care: Secondary | ICD-10-CM | POA: Diagnosis not present

## 2022-07-16 DIAGNOSIS — G8191 Hemiplegia, unspecified affecting right dominant side: Secondary | ICD-10-CM

## 2022-07-16 DIAGNOSIS — E872 Acidosis, unspecified: Secondary | ICD-10-CM

## 2022-07-16 DIAGNOSIS — D72829 Elevated white blood cell count, unspecified: Secondary | ICD-10-CM

## 2022-07-16 DIAGNOSIS — E871 Hypo-osmolality and hyponatremia: Secondary | ICD-10-CM

## 2022-07-16 DIAGNOSIS — N179 Acute kidney failure, unspecified: Secondary | ICD-10-CM

## 2022-07-16 LAB — CULTURE, BLOOD (ROUTINE X 2): Culture: NO GROWTH

## 2022-07-17 DIAGNOSIS — E872 Acidosis, unspecified: Secondary | ICD-10-CM

## 2022-07-17 DIAGNOSIS — D72829 Elevated white blood cell count, unspecified: Secondary | ICD-10-CM

## 2022-07-17 DIAGNOSIS — G8191 Hemiplegia, unspecified affecting right dominant side: Secondary | ICD-10-CM

## 2022-07-17 DIAGNOSIS — E871 Hypo-osmolality and hyponatremia: Secondary | ICD-10-CM

## 2022-07-17 DIAGNOSIS — N179 Acute kidney failure, unspecified: Secondary | ICD-10-CM

## 2022-08-09 NOTE — Progress Notes (Signed)
Pt's daughter provided RN with funeral home arrangements. Per daughter, it is their wish to follow Jewish tradition to remain with body until it is picked up by the funeral home. [employer] [facility] is able to pick body up tonight but are waiting on communication from the hospital before sending for the pt body. Agricultural consultant and [employer] primary RN provided with updated plan and will arrange for communication with hospital morgue and funeral home. Family at bedside are agreeable to plan.

## 2022-08-09 NOTE — Death Summary Note (Signed)
DEATH SUMMARY   Patient Details  Name: Molly Lucas MRN: [id number] DOB: [birth year] UTM:[id number], Ermalene Searing, MD Admission/Discharge Information   Admit Date:  2022-08-03  Date of Death: Date of Death: August 08, 2022  Time of Death: Time of Death: February 13, 1847  Length of Stay: 5   Principle Cause of death: Acute cardiopulmonary arrest in the setting of type aortic dissection with cardiogenic shock from pericardial tamponade  Hospital Diagnoses: Principal Problem:   Aortic dissection (HCC) Active Problems:   Weakness generalized   Metabolic acidosis   Right hemiparesis (HCC)   AKI (acute kidney injury) ([place])   Leukocytosis   Hyponatremia  Hospital Course: The patient is a 76 year old obese Caucasian female with a past medical history significant for but not limited to depression and anxiety, anemia, constipation, history of uterine fibroids, GERD, history of seasonal allergies, hypertension, hyperlipidemia, osteoarthritis of the left hip, sleep apnea as well as other comorbidities who presented to the hospital with shortness of breath from her STR facility where she was convalescing following robotic hiatal hernia repair by Dr. Rosendo Gros on 06/30/2022.  She presented in shock and code sepsis was initiated she was given a liter of IV fluids and started on epinephrine which corrected initial shock.  She was initially thought to have suffered a PE but the chest PE protocol showed a pericardial effusion and suspicion of dissection.  She underwent a dedicated dissection study at which showed a type a dissection to the level of the iliacs and cardiothoracic surgery was consulted.  She developed right hemiparesis concerning for embolic stroke from the dissection.  She is a poor surgical candidate with high mortality risk and after discussion the patient was made DNR and decision was made to proceed with comfort care.   A few nights ago she became a little bit more uncomfortable so was started on a morphine  drip.  She appeared a bit more comfortable during the day the following day but then subsequently she was changed from a morphine drip to a Dilaudid drip given that her renal function did worsen last check.  Palliative care was consulted for further symptom management.  Patient was given atropine drops as well as scopolamine patch as well as glycopyrrolate for her upper airway secretions.  She continued to decompensate and on the evening of 2022-08-08 at [place] she passed away peacefully on a Dilaudid drip with family members at bedside with 2 nurses to verify her death.  Funeral arrangements were being made and the RN provided the updated plan and arrange for communication with the hospital [employer] the funeral home.  Assessment and Plan:  Acute type A aortic dissection with cardiogenic shock from pericardial tamponade  AKI from shock and possible renal artery involvement Right hemiparesis likely due to left carotid involvement.  Leukocytosis Hyponatremia Metabolic Acidosis Obesity   -She required esmolol and pressors previously -Continue full comfort care now passed away on a Dilaudid drip -Cardiothoracic surgery was consulted and they discussed patient's clinical course and she is a very poor surgical candidate with a high likelihood of perioperative mortality; appreciate Dr. Cyndia Bent speaking with the family again last night -D/c aline.  Keep femoral access for access.  -Transferred to palliative floor and anticipate in-hospital death so she will remain here -No further labs -Patient was switched to Dilaudid gtt and is getting 0.5-4 mg/h along with breakthrough Dilaudid 0.5 mg IV every 15 minutes as needed for dyspnea and pain -C/w acetaminophen 650 mg p.o. every 6/RC as needed for fever greater  than 101 -Glycopyrrolate has been scheduled at 0.4 mg IV every 4 hours -Continue Foley catheter -Continue with lorazepam 1 to 2 mg IV every 2 as needed for anxiety as well as MiraLAX 17 g p.o. daily as  needed for constipation and Liquifilm ophthalmic 1.4% drops in both eyes 4 times daily as needed for dry eyes -Her excessive secretions we have added scopolamine patch as well as atropine sublingual drops with little to no avail.  Patient continues to sound rhonchorous but appears comfortable -Will avoid ice chips and move to mouth swabs -Family inquired about transfer to hospice but given her SBP in the 70's, felt too high risk and could worsen her discomfort and now she appears to be getting closer to passing away -As expected anticipated hospital passing and further decompensation and is not safe enough to be transition to residential hospice -She passed away peacefully on 08-07-22 at [facility] with family members at bedside with 2 nurses verifying her death  Procedures: CT of the chest  Consultations: PCCM transfer and cardiothoracic surgery  The results of significant diagnostics from this hospitalization (including imaging, microbiology, ancillary and laboratory) are listed below for reference.   Significant Diagnostic Studies: CT ABDOMEN PELVIS W CONTRAST  Result Date: 07/15/2022 CLINICAL DATA:  Sepsis status post surgery. EXAM: CT ABDOMEN AND PELVIS WITH CONTRAST TECHNIQUE: Multidetector CT imaging of the abdomen and pelvis was performed using the standard protocol following bolus administration of intravenous contrast. RADIATION DOSE REDUCTION: This exam was performed according to the departmental dose-optimization program which includes automated exposure control, adjustment of the mA and/or kV according to patient size and/or use of iterative reconstruction technique. CONTRAST:  124m OMNIPAQUE IOHEXOL 350 MG/ML SOLN COMPARISON:  02/17/2018 FINDINGS: Lower chest: See separate CT angio chest report from today. Hepatobiliary: There is a heterogeneous attenuation of the liver parenchyma. Focal peripheral area of low density within the anterior aspect of the lateral segment of left lobe of liver is  noted measuring 4.8 x 3.0 cm, image 16/4. Although nonspecific differential considerations include hepatic infarct versus focal fatty deposition. There is marked diffuse gallbladder wall edema with wall thickness measuring 1.3 cm, image 35/4. No biliary dilatation. Pancreas: Unremarkable. No pancreatic ductal dilatation or surrounding inflammatory changes. Spleen: Normal in size without focal abnormality. Adrenals/Urinary Tract: Adrenal glands are unremarkable. Kidneys are normal, without renal calculi, focal lesion, or hydronephrosis. Bladder decompressed around a Foley catheter. Stomach/Bowel: The stomach is diffusely distended. There are postoperative changes from recent hiatal hernia repair. No bowel wall thickening, inflammation, or distension. Moderate retained stool noted within the rectum. Vascular/Lymphatic: Continuation of thoracic aortic dissection into the abdominal aorta. The dissection a extends beyond the bifurcation into bilateral common iliac arteries. The left common iliac artery dissection terminates proximal to the bifurcation, image 51/4. The right common iliac artery dissection flap extends into the right external iliac artery and terminates proximal to the right common femoral artery, image 72/4. The right internal iliac artery arises off the false lumen, image 60 and image 61 of series 4. The celiac artery, superior mesenteric artery, and bilateral renal arteries appear to arise off the false lumen, image 27/4, image 29/4, and image 30/4. The inferior mesenteric artery also appears to arise off the false lumen. The portal vein, portal venous confluence and SMV all appear patent and perfused. Splenic vein is also patent. Right femoral approach central venous catheter is noted which terminates in the distal external iliac vein. No abdominopelvic adenopathy. Reproductive: Uterus and bilateral adnexa are unremarkable. Other:  Right upper quadrant edema centered around the gallbladder is noted.  Trace free fluid noted within the dependent portion of the pelvis. Scattered foci of pneumoperitoneum identified which may be sequelae of recent robotic assisted hiatal hernia repair. Musculoskeletal: No acute or significant osseous findings. IMPRESSION: 1. Continuation of thoracic aortic dissection into the abdominal aorta. The dissection flap extends beyond the aortic bifurcation into bilateral common iliac arteries. 2. The right common iliac dissection flap extends into the right external iliac artery and terminates proximal to the right common femoral artery. The right internal iliac artery arises off the false lumen. The left common iliac dissection flap terminates proximal to the bifurcation. 3. The celiac artery, superior mesenteric artery, bilateral renal arteries, and inferior mesenteric artery arise off the false lumen. 4. There is marked diffuse edema involving the gallbladder. This is of uncertain clinical significance. Differential considerations include acute cholecystitis, or gallbladder wall edema secondary to heart failure. Gallbladder ischemia secondary to dissection would be another potential diagnostic consideration. 5. Heterogeneous enhancement of the liver parenchyma is identified. Within the lateral segment of left hepatic lobe there is a wedge-shaped area of more focal hypoenhancement which is of uncertain significance. This may simply reflect altered perfusion secondary to dissection versus focal fatty deposition versus focal infarct. 6. Postoperative changes from recent hiatal hernia repair. Scattered foci of pneumoperitoneum identified which may be sequelae of recent robotic assisted hiatal hernia repair. Critical Value/emergent results were called by telephone at the time of interpretation on 08/07/2022 at 9:11 am to provider Dorie Rank, MD, who verbally acknowledged these results. Electronically Signed   By: Kerby Moors M.D.   On: 07/28/2022 09:12   DG Chest Port 1 View  Result Date:  07/18/2022 CLINICAL DATA:  Shortness of breath. EXAM: PORTABLE CHEST 1 VIEW COMPARISON:  July 02, 2022. FINDINGS: Stable cardiomegaly. Right lung is clear. Left basilar atelectasis or infiltrate is noted with associated pleural effusion. Bony thorax is unremarkable. IMPRESSION: Stable left basilar atelectasis or infiltrate with small pleural effusion. Electronically Signed   By: Marijo Conception M.D.   On: 07/17/2022 09:05   CT ANGIO CHEST AORTA W/CM &/OR WO/CM  Result Date: 07/23/2022 CLINICAL DATA:  Evaluate for aortic dissection. EXAM: CT ANGIOGRAPHY CHEST WITH CONTRAST TECHNIQUE: Multidetector CT imaging of the chest was performed using the standard protocol during bolus administration of intravenous contrast. Multiplanar CT image reconstructions and MIPs were obtained to evaluate the vascular anatomy. RADIATION DOSE REDUCTION: This exam was performed according to the departmental dose-optimization program which includes automated exposure control, adjustment of the mA and/or kV according to patient size and/or use of iterative reconstruction technique. CONTRAST:  54m OMNIPAQUE IOHEXOL 350 MG/ML SOLN COMPARISON:  02/17/2018 FINDINGS: Cardiovascular: The heart size appears normal. There is a moderate pericardial fluid collection measuring 54 Hounsfield units. Hemopericardium cannot be excluded. Ascending thoracic aortic aneurysm measures 4.6 cm, image 52/8. There is a DeBakey type 1, Stanford type A dissection which originates at the aortic root and extends into the abdominal aorta. The great vessels arise off the true lumen of the aorta. Mediastinum/Nodes: Patulous esophagus compatible with chronic dysmotility. Postoperative changes from hiatal hernia repair with ex vacuo fluid collection surrounding the distal esophagus. The trachea and thyroid gland appear normal. No adenopathy. Lungs/Pleura: Small to moderate bilateral pleural effusions. Ground-glass attenuation and interlobular septal thickening is  identified compatible with pulmonary edema. Bibasilar subsegmental atelectasis. Upper Abdomen: See separate abdominal CT report from 07/26/2022. Musculoskeletal: No acute abnormality. Review of the MIP images confirms the  above findings. IMPRESSION: 1. Examination is positive for Stanford type A dissection. 2. Moderate pericardial fluid collection measuring 55 Hounsfield units. Hemopericardium cannot be excluded. 3. Small to moderate bilateral pleural effusions and pulmonary edema compatible with CHF. Critical Value/emergent results were called by telephone at the time of interpretation on 08/01/2022 at 8:52 am to provider Dorie Rank, MD , who verbally acknowledged these results. Electronically Signed   By: Kerby Moors M.D.   On: 07/15/2022 08:53   CT Angio Chest PE W and/or Wo Contrast  Result Date: 08/05/2022 CLINICAL DATA:  Rule out pulmonary embolism. EXAM: CT ANGIOGRAPHY CHEST WITH CONTRAST TECHNIQUE: Multidetector CT imaging of the chest was performed using the standard protocol during bolus administration of intravenous contrast. Multiplanar CT image reconstructions and MIPs were obtained to evaluate the vascular anatomy. RADIATION DOSE REDUCTION: This exam was performed according to the departmental dose-optimization program which includes automated exposure control, adjustment of the mA and/or kV according to patient size and/or use of iterative reconstruction technique. CONTRAST:  153m OMNIPAQUE IOHEXOL 350 MG/ML SOLN COMPARISON:  02/17/2018 FINDINGS: Cardiovascular: Satisfactory opacification of the pulmonary arteries to the segmental level. No evidence of pulmonary embolism. Heart size appears normal. There is a moderate pericardial fluid collection which measures 55 Hounsfield units, image 86/4. Suspicious for hemopericardium. There is prominent reflux of contrast material from the right ventricle into the IVC and hepatic veins which is compatible with passive venous congestion suggestive of right  heart failure. Mediastinum/Nodes: Thyroid gland, trachea, and esophagus are unremarkable. No enlarged lymph nodes. The proximal and mid esophagus appear diffusely patulous compatible with chronic dysmotility. Status post repair of previous large hiatal hernia with ex vacuo fluid collection surrounding the distal esophagus measuring 7.4 x 3.5 by 8.7 cm. Lungs/Pleura: Small to moderate bilateral pleural effusions identified. Bilateral interlobular septal thickening and ground-glass attenuation is noted compatible with pulmonary edema. Atelectasis noted within both lung bases. Upper Abdomen: Sub optimal visualization of the upper abdomen due to phase of contrast opacification. Postoperative changes from hiatal hernia repair noted. Musculoskeletal: No chest wall abnormality. No acute or significant osseous findings. Review of the MIP images confirms the above findings. IMPRESSION: 1. No evidence for acute pulmonary embolus. 2. Moderate, intermediate to high attenuation pericardial fluid collection is identified worrisome for hemopericardium. 3. Reflux of contrast material into the IVC and hepatic veins compatible with right heart failure. 4. Small to moderate bilateral pleural effusions and pulmonary edema compatible with CHF. 5. Status post repair of previous large hiatal hernia with ex vacuo fluid collection surrounding the distal esophagus. Electronically Signed   By: TKerby MoorsM.D.   On: 07/31/2022 08:46   DG Chest Port 1 View  Result Date: 07/02/2022 CLINICAL DATA:  New atrial fibrillation. Recent laparoscopic surgery this week, hiatal hernia repair. EXAM: PORTABLE CHEST 1 VIEW COMPARISON:  None Available. FINDINGS: Low lung volumes. The heart is normal in size for technique. Aortic atherosclerosis and tortuosity. Streaky opacities at the left lung base likely related to atelectasis. There may be a small amount of left pleural fluid. No pneumothorax. No pulmonary edema. IMPRESSION: 1. Low lung volumes with  left basilar atelectasis and possible small left pleural effusion. 2. Aortic atherosclerosis and tortuosity. Electronically Signed   By: MKeith RakeM.D.   On: 07/02/2022 23:55   DG ESOPHAGUS W SINGLE CM (SOL OR THIN BA)  Result Date: 07/01/2022 CLINICAL DATA:  Patient 1 day postop robotic hiatal hernia and fundoplication. Request for single contrast medium esophagram to evaluate for leak.  EXAM: ESOPHAGUS/BARIUM SWALLOW/TABLET STUDY TECHNIQUE: Single contrast examination was performed using thin liquid barium. This exam was performed by Narda Rutherford, NP, and was supervised and interpreted by Nelson Chimes, MD. FLUOROSCOPY: Radiation Exposure Index (as provided by the fluoroscopic device): 69.10 mGy Kerma COMPARISON:  Esophagram dated 11/26/2021 FINDINGS: Swallowing: Appears normal. No penetration or aspiration seen. Pharynx: Unremarkable. Esophagus: No esophageal mass. Esophageal motility: Age-related esophageal dysmotility. Hiatal Hernia: None. Gastroesophageal reflux: None visualized. Ingested 37m barium tablet: Not given Other: Narrowing at fundoplication presumably due to postoperative swelling with small passage of contrast. IMPRESSION: Age-related esophageal dysmotility. Narrowing at fundoplication presumably due to postoperative swelling with small volume passage of contrast into the stomach. No leak visualized during exam. Read by: SNarda Rutherford AGNP-BC Electronically Signed   By: MNelson ChimesM.D.   On: 07/01/2022 11:11    Microbiology: Recent Results (from the past 240 hour(s))  Blood culture (routine x 2)     Status: None   Collection Time: 07/18/2022  8:15 AM   Specimen: BLOOD  Result Value Ref Range Status   Specimen Description BLOOD  Final   Special Requests   Final    RIGHT FEMORAL Blood Culture adequate volume BOTTLES DRAWN AEROBIC AND ANAEROBIC   Culture   Final    NO GROWTH 5 DAYS [facility]formed at [facility] [address], [place] [address]   Report Status  02023/09/22FINAL  Final  SARS Coronavirus 2 by RT PCR (hospital order[facility]) *cepheid single result test* Anterior Nasal Swab     Status: None   Collection Time: 07/24/2022  8:32 AM   Specimen: Anterior Nasal Swab  Result Value Ref Range Status   SARS Coronavirus 2 by RT PCR NEGATIVE NEGATIVE Final    Comment: (NOTE) SARS-CoV-2 target nucleic acids are NOT DETECTED.  The SARS-CoV-2 RNA is generally detectable in upper and lower respiratory specimens during the acute phase of infection. The lowest concentration of SARS-CoV-2 viral copies this assay can detect is 250 copies / mL. A negative result does not preclude SARS-CoV-2 infection and should not be used as the sole basis for treatment or other patient management decisions.  A negative result may occur with improper specimen collection / handling, submission of specimen other than nasopharyngeal swab, presence of viral mutation(s) within the areas targeted by this assay, and inadequate number of viral copies (<250 copies / mL). A negative result must be combined with clinical observations, patient history, and epidemiological information.  Fact Sheet for Patients:   hhttps://www.patel.info/ Fact Sheet for Healthcare Providers: hhttps://hall.com/ This test is not yet approved or  cleared by the [place] and has been authorized for detection and/or diagnosis of SARS-CoV-2 by FDA under an Emergency Use Authorization (EUA).  This EUA will remain in effect (meaning this test can be used) for the duration of the COVID-19 declaration under Section 564(b)(1) of the Act, 21 U.S.C. section 360bbb-3(b)(1), unless the authorization is terminated or revoked sooner.  Performed at [facility] [address], [place] [address]  MRSA Next Gen by PCR, Nasal     Status: None   Collection Time: 07/10/2022 12:29 PM   Specimen: Nasal Mucosa; Nasal  Swab  Result Value Ref Range Status   MRSA by PCR Next Gen NOT DETECTED NOT DETECTED Final    Comment: (NOTE) The GeneXpert MRSA Assay (FDA approved for NASAL specimens only), is one component of a comprehensive MRSA colonization  surveillance program. It is not intended to diagnose MRSA infection nor to guide or monitor treatment for MRSA infections. Test performance is not FDA approved in patients less than 36 years old. Performed at [facility], [place] [address], [place], [place] [address]    Signed: Raiford Noble, DO [facility] 07/17/22

## 2022-08-09 NOTE — Progress Notes (Signed)
PROGRESS NOTE    [facility]  LDJ:[id number] DOB: 26-Oct-1946 DOA: 07/26/2022 PCP: Donnajean Lopes, MD   Brief Narrative:  Patient is a 76 year old obese Caucasian female with a past medical history significant for but not limited to depression and anxiety, anemia, constipation, history of uterine fibroids, GERD, history of seasonal allergies, hypertension, hyperlipidemia, osteoarthritis of the left hip, sleep apnea as well as other comorbidities who presented to the hospital with shortness of breath from her STR facility where she was convalescing following robotic hiatal hernia repair by Dr. Rosendo Gros on 06/30/2022.  She presented in shock and code sepsis was initiated she was given a liter of IV fluids and started on epinephrine which corrected initial shock.  She was initially thought to have suffered a PE but the chest PE protocol showed a pericardial effusion and suspicion of dissection.  She underwent a dedicated dissection study at which showed a type a dissection to the level of the iliacs and cardiothoracic surgery was consulted.  She developed right hemiparesis concerning for embolic stroke from the dissection.  She is a poor surgical candidate with high mortality risk and after discussion the patient was made DNR and decision was made to proceed with comfort care.   Overnight she became a little bit more uncomfortable so was started on a morphine drip.  She appears a little bit comfortable today and is more awake and alert compared to yesterday given that she received Ativan.  Will likely change from the morphine drip to Dilaudid drip given that her renal function had worsened in the last check. Palliative care has been consulted for further symptom management and she has been transitioned to a Dilaudid drip   Assessment and Plan:  Acute type A aortic dissection with cardiogenic shock from pericardial tamponade  AKI from shock and possible renal artery involvement Right hemiparesis  likely due to left carotid involvement.  Leukocytosis Metabolic Acidosis Obesity   -She required esmolol and pressors previously -Continue full comfort care now -Cardiothoracic surgery was consulted and they discussed patient's clinical course and she is a very poor surgical candidate with a high likelihood of perioperative mortality; appreciate Dr. Cyndia Bent speaking with the family again last night -D/c aline.  Keep femoral access for access.  -Transferred to palliative floor and anticipate in-hospital death so she will remain here -No further labs -Patient was switched to Dilaudid gtt and is getting 0.5-4 mg/h along with breakthrough Dilaudid 0.5 mg IV every 15 minutes as needed for dyspnea and pain -C/w acetaminophen 650 mg p.o. every 6/RC as needed for fever greater than 101 -Glycopyrrolate has been scheduled at 0.4 mg IV every 4 hours -Continue Foley catheter -Continue with lorazepam 1 to 2 mg IV every 2 as needed for anxiety as well as MiraLAX 17 g p.o. daily as needed for constipation and Liquifilm ophthalmic 1.4% drops in both eyes 4 times daily as needed for dry eyes -Her excessive secretions we have added scopolamine patch as well as atropine sublingual drops with little to no avail.  Patient continues to sound rhonchorous but appears comfortable -Will avoid ice chips and move to mouth swabs -Family inquired about transfer to hospice but given her SBP in the 70's, felt too high risk and could worsen her discomfort and now she appears to be getting closer to passing away -Anticipate hospital passing and further decompensation and is not safe enough to be transition to residential hospice -Continue ongoing family support and allow visitation as her prognosis is extremely poor  and anticipate passing away in within Limited hours to days   DVT prophylaxis:  None as patient is comfort care    Code Status: DNR Family Communication: Discussed with the sons at bedside  Disposition Plan:   Level of care: Med-Surg Status is: Inpatient Remains inpatient appropriate because: Anticipate in-hospital death given that she is not safe enough to transfer given her extreme somnolence and short shallow breathing now   Consultants:  PCCM transfer Cardiothoracic surgery  Procedures:  CTA of the chest  Antimicrobials:  Anti-infectives (From admission, onward)    None       Subjective: Seen and examined at bedside and she appears comfortable and continues to have short shallow breathing but sounds rhonchorous due to her excessive secretions.  Did not really respond to me on physical examination.  2 sons are at bedside.  No other concerns or complaints this time.  Objective: Vitals:   07/13/22 0631 07/13/22 2028 07/14/22 1957 07/15/22 2010  BP: 127/71 (!) 117/56 104/69 134/68  Pulse: 73 80 (!) 120 97  Resp: 16 16    Temp: 97.6 F (36.4 C) 98.6 F (37 C) 97.8 F (36.6 C) 99.4 F (37.4 C)  TempSrc: Oral Axillary Oral Oral  SpO2: 93% 90% (!) 89% (!) 70%   No intake or output data in the 24 hours ending 08/01/2022 1328 There were no vitals filed for this visit.  Examination: Physical Exam:  Constitutional: WN/WD obese Caucasian female who is comfortable on the monitor but has some secretions and rhonchorous upper airway sounds Respiratory: Diminished to auscultation bilaterally with coarse breath sounds and some rhonchi and some slight crackles.  Slightly increased respiratory rate and has short shallow breathing Cardiovascular: RRR, no murmurs / rubs / gallops. S1 and S2 auscultated.  Has 1+ lower extremity edema Abdomen: Soft, non-tender, distended secondary body habitus. Bowel sounds positive.  GU: Deferred.  Foley catheter is in place Musculoskeletal: No clubbing / cyanosis of digits/nails. No joint deformity upper and lower extremities. Skin: No rashes, lesions, ulcers limited skin evaluation Neurologic: Does not respond to physical or verbal stimuli today.  Data  Reviewed: I have personally reviewed following labs and imaging studies  CBC: Recent Labs  Lab 07/28/2022 0655 08/05/2022 0701 07/12/22 0605  WBC 19.4*  --  19.3*  NEUTROABS 14.7*  --   --   HGB 14.2 15.6* 14.6  HCT 44.1 46.0 41.5  MCV 103.8*  --  97.2  PLT 201  --  734   Basic Metabolic Panel: Recent Labs  Lab 07/28/2022 0701 07/25/2022 0815 07/12/22 0605  NA 136 130* 132*  K 3.8 3.1* 5.0  CL 104 100 103  CO2  --  20* 19*  GLUCOSE 310* 288* 153*  BUN 9 9 29*  CREATININE 0.80 1.25* 2.07*  CALCIUM  --  8.6* 9.6   GFR: Estimated Creatinine Clearance: 25 mL/min (A) (by C-G formula based on SCr of 2.07 mg/dL (H)). Liver Function Tests: No results for input(s): "AST", "ALT", "ALKPHOS", "BILITOT", "PROT", "ALBUMIN" in the last 168 hours. No results for input(s): "LIPASE", "AMYLASE" in the last 168 hours. No results for input(s): "AMMONIA" in the last 168 hours. Coagulation Profile: No results for input(s): "INR", "PROTIME" in the last 168 hours. Cardiac Enzymes: No results for input(s): "CKTOTAL", "CKMB", "CKMBINDEX", "TROPONINI" in the last 168 hours. BNP (last 3 results) No results for input(s): "PROBNP" in the last 8760 hours. HbA1C: No results for input(s): "HGBA1C" in the last 72 hours. CBG: No results for input(s): "  GLUCAP" in the last 168 hours. Lipid Profile: No results for input(s): "CHOL", "HDL", "LDLCALC", "TRIG", "CHOLHDL", "LDLDIRECT" in the last 72 hours. Thyroid Function Tests: No results for input(s): "TSH", "T4TOTAL", "FREET4", "T3FREE", "THYROIDAB" in the last 72 hours. Anemia Panel: No results for input(s): "VITAMINB12", "FOLATE", "FERRITIN", "TIBC", "IRON", "RETICCTPCT" in the last 72 hours. Sepsis Labs: Recent Labs  Lab 08/02/2022 0655 07/10/2022 0830  LATICACIDVEN 8.6* 8.1*    Recent Results (from the past 240 hour(s))  Blood culture (routine x 2)     Status: None   Collection Time: 07/14/2022  8:15 AM   Specimen: BLOOD  Result Value Ref Range Status    Specimen Description BLOOD  Final   Special Requests   Final    RIGHT FEMORAL Blood Culture adequate volume BOTTLES DRAWN AEROBIC AND ANAEROBIC   Culture   Final    NO GROWTH 5 DAYS Performed at [facility], [place] [address], [place], [place] [address]    Report Status 07-23-2022 FINAL  Final  SARS Coronavirus 2 by RT PCR (hospital order, performed in [facility] hospital lab) *cepheid single result test* Anterior Nasal Swab     Status: None   Collection Time: 07/19/2022  8:32 AM   Specimen: Anterior Nasal Swab  Result Value Ref Range Status   SARS Coronavirus 2 by RT PCR NEGATIVE NEGATIVE Final    Comment: (NOTE) SARS-CoV-2 target nucleic acids are NOT DETECTED.  The SARS-CoV-2 RNA is generally detectable in upper and lower respiratory specimens during the acute phase of infection. The lowest concentration of SARS-CoV-2 viral copies this assay can detect is 250 copies / mL. A negative result does not preclude SARS-CoV-2 infection and should not be used as the sole basis for treatment or other patient management decisions.  A negative result may occur with improper specimen collection / handling, submission of specimen other than nasopharyngeal swab, presence of viral mutation(s) within the areas targeted by this assay, and inadequate number of viral copies (<250 copies / mL). A negative result must be combined with clinical observations, patient history, and epidemiological information.  Fact Sheet for Patients:   https://www.patel.info/  Fact Sheet for Healthcare Providers: https://hall.com/  This test is not yet approved or  cleared by the [place] FDA and has been authorized for detection and/or diagnosis of SARS-CoV-2 by FDA under an Emergency Use Authorization (EUA).  This EUA will remain in effect (meaning this test can be used) for the duration of the COVID-19 declaration under Section 564(b)(1) of the Act, 21  U.S.C. section 360bbb-3(b)(1), unless the authorization is terminated or revoked sooner.  Performed at [facility], [place] [address] [place], [place] [address]   MRSA Next Gen by PCR, Nasal     Status: None   Collection Time: 07/15/2022 12:29 PM   Specimen: Nasal Mucosa; Nasal Swab  Result Value Ref Range Status   MRSA by PCR Next Gen NOT DETECTED NOT DETECTED Final    Comment: (NOTE) The GeneXpert MRSA Assay (FDA approved for NASAL specimens only), is one component of a comprehensive MRSA colonization surveillance program. It is not intended to diagnose MRSA infection nor to guide or monitor treatment for MRSA infections. Test performance is not FDA approved in patients less than 60 years old. Performed at [facility], [place] [address], [place], [place] [address]     Radiology Studies: No results found.  Scheduled Meds:  atropine  1 drop Sublingual QID   glycopyrrolate  0.4 mg Intravenous  Q4H   scopolamine  1 patch Transdermal Q72H   Continuous Infusions:  sodium chloride Stopped (07/12/22 1114)   HYDROmorphone 1 mg/hr (2022/07/20 0731)    LOS: 5 days   Raiford Noble, DO [facility] Available via Epic secure chat 7am-7pm After these hours, please refer to coverage provider listed on [email or web address] 20-Jul-2022, 1:28 PM

## 2022-08-09 NOTE — Progress Notes (Signed)
Patient PE:TKKOE Vandella Ord      DOB: Mar 01, 1946      CXF:[id number]      Palliative Medicine Team    Subjective: Bedside symptom check completed. Two sons bedside at time of visit.   Physical exam: Patient resting in bed with eyes closed at time of visit. Breathing mostly even and non-labored, excessive secretions noted despite medications adjusted to manage. Patient without physical or non-verbal signs of pain or discomfort at this time. Patient not interactive at this time, but does open eyes when face touched by son. All extremities warm to touch, difficult to palpate distal pulses.    Assessment and plan: This RN advised family that secretion burden at this time is a larger bother to [place] than to the patient. She remains comfortable on analgesic agents. This RN did sit the patient up slightly and help to turn to left side to see if positioning would help with rattling, unsuccessful. Family understanding of situation, voice they would like to keep her in house, not to transfer to residential facility at this time. Bedside RN without needs or concerns this morning. Anticipate nearing EOL in coming hours to days. Will continue to follow for any changes or advances.    Thank you for allowing the Palliative Medicine Team to assist in the care of this patient.     Damian Leavell, MSN, RN Palliative Medicine Team Team Phone: [phone]  This phone is monitored 7a-7p, please reach out to attending physician outside of these hours for urgent needs.

## 2022-08-09 NOTE — Progress Notes (Signed)
Dilaudid infusion waste 20m with [facility]ks Ranger

## 2022-08-09 NOTE — Progress Notes (Signed)
       CROSS COVER NOTE  NAME: Molly Lucas MRN: [id number] DOB : [birth year]   Notified by RN that patient has expired at [facility]   Patient was DNR and comfort care  2 RN verified.  Family was at bedside and available to RN.   This document was prepared using Dragon voice recognition software and may include unintentional dictation errors.  Neomia Glass DNP, MHA, FNP-BC Nurse Practitioner [facility] Pager [phone]

## 2022-08-09 NOTE — Progress Notes (Signed)
Called to room by family reporting pt has passed. 2 RN verification with charge RN. MD on-call paged. Family remains present at bedside.

## 2022-08-09 DEATH — deceased

## 2022-08-18 ENCOUNTER — Ambulatory Visit: Payer: Medicare Other | Admitting: Nurse Practitioner

## 2022-10-14 ENCOUNTER — Other Ambulatory Visit: Payer: Medicare Other

## 2022-10-14 ENCOUNTER — Ambulatory Visit: Payer: Medicare Other
